# Patient Record
Sex: Female | Born: 1940 | Race: Black or African American | Hispanic: No | Smoking: Never smoker
Health system: Southern US, Community
[De-identification: ages and names within clinical notes are randomized; demographics above are authoritative.]

## PROBLEM LIST (undated history)

## (undated) DIAGNOSIS — K219 Gastro-esophageal reflux disease without esophagitis: Secondary | ICD-10-CM

## (undated) DIAGNOSIS — I219 Acute myocardial infarction, unspecified: Secondary | ICD-10-CM

## (undated) DIAGNOSIS — G5701 Lesion of sciatic nerve, right lower limb: Secondary | ICD-10-CM

## (undated) DIAGNOSIS — I639 Cerebral infarction, unspecified: Secondary | ICD-10-CM

## (undated) DIAGNOSIS — I10 Essential (primary) hypertension: Secondary | ICD-10-CM

## (undated) DIAGNOSIS — E119 Type 2 diabetes mellitus without complications: Secondary | ICD-10-CM

## (undated) HISTORY — DX: Lesion of sciatic nerve, right lower limb: G57.01

## (undated) HISTORY — PX: ABDOMINAL HYSTERECTOMY: SHX81

## (undated) HISTORY — PX: BREAST BIOPSY: SHX20

## (undated) HISTORY — PX: BREAST SURGERY: SHX581

---

## 2005-02-07 ENCOUNTER — Ambulatory Visit: Payer: Self-pay | Admitting: Nurse Practitioner

## 2005-02-25 ENCOUNTER — Other Ambulatory Visit: Payer: Self-pay

## 2005-02-25 ENCOUNTER — Emergency Department: Payer: Self-pay | Admitting: Emergency Medicine

## 2005-03-19 ENCOUNTER — Ambulatory Visit: Payer: Self-pay | Admitting: *Deleted

## 2005-12-25 ENCOUNTER — Ambulatory Visit: Payer: Self-pay | Admitting: *Deleted

## 2006-02-21 ENCOUNTER — Ambulatory Visit: Payer: Self-pay | Admitting: *Deleted

## 2007-02-24 ENCOUNTER — Ambulatory Visit: Payer: Self-pay | Admitting: *Deleted

## 2007-10-01 ENCOUNTER — Ambulatory Visit: Payer: Self-pay | Admitting: Internal Medicine

## 2007-10-02 ENCOUNTER — Ambulatory Visit: Payer: Self-pay | Admitting: Internal Medicine

## 2008-03-17 ENCOUNTER — Ambulatory Visit: Payer: Self-pay | Admitting: *Deleted

## 2008-05-13 ENCOUNTER — Ambulatory Visit: Payer: Self-pay | Admitting: Family Medicine

## 2008-08-17 ENCOUNTER — Inpatient Hospital Stay: Payer: Self-pay | Admitting: Internal Medicine

## 2008-10-10 ENCOUNTER — Ambulatory Visit: Payer: Self-pay | Admitting: Internal Medicine

## 2008-10-12 ENCOUNTER — Ambulatory Visit: Payer: Self-pay | Admitting: Internal Medicine

## 2008-12-16 ENCOUNTER — Emergency Department: Payer: Self-pay | Admitting: Emergency Medicine

## 2008-12-20 ENCOUNTER — Ambulatory Visit: Payer: Self-pay | Admitting: Orthopedic Surgery

## 2009-01-25 ENCOUNTER — Encounter: Payer: Self-pay | Admitting: Family Medicine

## 2009-02-02 ENCOUNTER — Encounter: Payer: Self-pay | Admitting: Family Medicine

## 2009-03-05 ENCOUNTER — Encounter: Payer: Self-pay | Admitting: Family Medicine

## 2009-03-20 ENCOUNTER — Ambulatory Visit: Payer: Self-pay | Admitting: Family Medicine

## 2009-06-12 ENCOUNTER — Ambulatory Visit: Payer: Self-pay | Admitting: Family Medicine

## 2009-08-01 ENCOUNTER — Emergency Department: Payer: Self-pay | Admitting: Emergency Medicine

## 2009-08-17 ENCOUNTER — Inpatient Hospital Stay: Payer: Self-pay | Admitting: Internal Medicine

## 2010-05-09 ENCOUNTER — Ambulatory Visit: Payer: Self-pay | Admitting: Internal Medicine

## 2011-05-14 ENCOUNTER — Ambulatory Visit: Payer: Self-pay | Admitting: Internal Medicine

## 2011-12-25 ENCOUNTER — Emergency Department: Payer: Self-pay

## 2011-12-25 LAB — CBC
MCH: 30 pg (ref 26.0–34.0)
MCHC: 32.8 g/dL (ref 32.0–36.0)
MCV: 92 fL (ref 80–100)
Platelet: 288 10*3/uL (ref 150–440)
RBC: 3.9 10*6/uL (ref 3.80–5.20)
RDW: 12.6 % (ref 11.5–14.5)

## 2011-12-25 LAB — COMPREHENSIVE METABOLIC PANEL
Albumin: 3.3 g/dL — ABNORMAL LOW (ref 3.4–5.0)
Anion Gap: 10 (ref 7–16)
BUN: 17 mg/dL (ref 7–18)
Chloride: 107 mmol/L (ref 98–107)
Co2: 23 mmol/L (ref 21–32)
EGFR (African American): >60
EGFR (Non-African Amer.): >60
Potassium: 4.1 mmol/L (ref 3.5–5.1)
SGOT(AST): 25 U/L (ref 15–37)
Sodium: 140 mmol/L (ref 136–145)
Total Protein: 6.9 g/dL (ref 6.4–8.2)

## 2011-12-25 LAB — URINALYSIS, COMPLETE
Bacteria: NONE SEEN
Bilirubin,UR: NEGATIVE
Blood: NEGATIVE
Glucose,UR: NEGATIVE mg/dL (ref 0–75)
Hyaline Cast: 4
Nitrite: NEGATIVE
Ph: 5 (ref 4.5–8.0)
Protein: NEGATIVE
Squamous Epithelial: 1

## 2011-12-27 ENCOUNTER — Emergency Department: Payer: Self-pay | Admitting: Emergency Medicine

## 2011-12-27 LAB — URINALYSIS, COMPLETE
Bacteria: NONE SEEN
Bilirubin,UR: NEGATIVE
Blood: NEGATIVE
Glucose,UR: >=500 mg/dL (ref 0–75)
Hyaline Cast: 3
Leukocyte Esterase: NEGATIVE
Ph: 5 (ref 4.5–8.0)
Protein: NEGATIVE
RBC,UR: 1 /HPF (ref 0–5)
Specific Gravity: 1.026 (ref 1.003–1.030)
Transitional Epi: <1
WBC UR: 1 /HPF (ref 0–5)

## 2011-12-27 LAB — CBC
HCT: 35.5 % (ref 35.0–47.0)
MCHC: 30.7 g/dL — ABNORMAL LOW (ref 32.0–36.0)
MCV: 94 fL (ref 80–100)
RBC: 3.79 10*6/uL — ABNORMAL LOW (ref 3.80–5.20)
RDW: 13.1 % (ref 11.5–14.5)

## 2011-12-27 LAB — BASIC METABOLIC PANEL
Anion Gap: 14 (ref 7–16)
BUN: 25 mg/dL — ABNORMAL HIGH (ref 7–18)
Calcium, Total: 8.8 mg/dL (ref 8.5–10.1)
Co2: 20 mmol/L — ABNORMAL LOW (ref 21–32)
EGFR (African American): >60
EGFR (Non-African Amer.): 56 — ABNORMAL LOW
Osmolality: 293 (ref 275–301)
Sodium: 134 mmol/L — ABNORMAL LOW (ref 136–145)

## 2011-12-27 LAB — CK TOTAL AND CKMB (NOT AT ARMC)
CK, Total: 156 U/L (ref 21–215)
CK-MB: 2.4 ng/mL (ref 0.5–3.6)

## 2011-12-27 LAB — TROPONIN I: Troponin-I: <0.02 ng/mL

## 2011-12-29 ENCOUNTER — Inpatient Hospital Stay: Payer: Self-pay | Admitting: Internal Medicine

## 2011-12-29 LAB — CBC
HCT: 38.6 % (ref 35.0–47.0)
MCH: 30 pg (ref 26.0–34.0)
MCV: 95 fL (ref 80–100)
Platelet: 301 10*3/uL (ref 150–440)
RDW: 13.3 % (ref 11.5–14.5)
WBC: 14.7 10*3/uL — ABNORMAL HIGH (ref 3.6–11.0)

## 2011-12-29 LAB — BASIC METABOLIC PANEL
Anion Gap: 18 — ABNORMAL HIGH (ref 7–16)
Calcium, Total: 9.6 mg/dL (ref 8.5–10.1)
Chloride: 97 mmol/L — ABNORMAL LOW (ref 98–107)
Co2: 15 mmol/L — ABNORMAL LOW (ref 21–32)
EGFR (African American): >60
EGFR (African American): >60
EGFR (Non-African Amer.): 57 — ABNORMAL LOW
Glucose: 568 mg/dL (ref 65–99)
Glucose: 171 mg/dL — ABNORMAL HIGH (ref 65–99)
Osmolality: 294 (ref 275–301)
Potassium: 4.6 mmol/L (ref 3.5–5.1)
Sodium: 131 mmol/L — ABNORMAL LOW (ref 136–145)
Sodium: 139 mmol/L (ref 136–145)

## 2011-12-29 LAB — HEPATIC FUNCTION PANEL A (ARMC)
Albumin: 3.8 g/dL (ref 3.4–5.0)
Alkaline Phosphatase: 80 U/L (ref 50–136)
Bilirubin,Total: 0.8 mg/dL (ref 0.2–1.0)
SGPT (ALT): 22 U/L

## 2011-12-29 LAB — URINALYSIS, COMPLETE
Ph: 5 (ref 4.5–8.0)
RBC,UR: 11 /HPF (ref 0–5)
Specific Gravity: 1.022 (ref 1.003–1.030)
Squamous Epithelial: 1
WBC UR: 140 /HPF (ref 0–5)

## 2011-12-29 LAB — TROPONIN I
Troponin-I: <0.02 ng/mL
Troponin-I: 0.02 ng/mL

## 2011-12-29 LAB — CK TOTAL AND CKMB (NOT AT ARMC)
CK, Total: 306 U/L — ABNORMAL HIGH (ref 21–215)
CK-MB: 3.6 ng/mL (ref 0.5–3.6)

## 2011-12-29 LAB — LIPASE, BLOOD: Lipase: 91 U/L (ref 73–393)

## 2011-12-30 LAB — CBC WITH DIFFERENTIAL/PLATELET
Basophil #: 0 10*3/uL (ref 0.0–0.1)
Basophil %: 0.4 %
Eosinophil #: 0.1 10*3/uL (ref 0.0–0.7)
Eosinophil %: 0.4 %
HCT: 32.1 % — ABNORMAL LOW (ref 35.0–47.0)
HGB: 10.6 g/dL — ABNORMAL LOW (ref 12.0–16.0)
Lymphocyte #: 3.2 10*3/uL (ref 1.0–3.6)
MCHC: 32.8 g/dL (ref 32.0–36.0)
MCV: 91 fL (ref 80–100)
Neutrophil #: 8.3 10*3/uL — ABNORMAL HIGH (ref 1.4–6.5)
Neutrophil %: 65 %
RDW: 13.1 % (ref 11.5–14.5)
WBC: 12.7 10*3/uL — ABNORMAL HIGH (ref 3.6–11.0)

## 2011-12-30 LAB — COMPREHENSIVE METABOLIC PANEL
Albumin: 3.1 g/dL — ABNORMAL LOW (ref 3.4–5.0)
Alkaline Phosphatase: 60 U/L (ref 50–136)
Anion Gap: 11 (ref 7–16)
BUN: 17 mg/dL (ref 7–18)
Bilirubin,Total: 0.5 mg/dL (ref 0.2–1.0)
Chloride: 109 mmol/L — ABNORMAL HIGH (ref 98–107)
Creatinine: 1 mg/dL (ref 0.60–1.30)
EGFR (African American): >60
Osmolality: 281 (ref 275–301)
Potassium: 3.4 mmol/L — ABNORMAL LOW (ref 3.5–5.1)
SGOT(AST): 29 U/L (ref 15–37)
Sodium: 141 mmol/L (ref 136–145)
Total Protein: 6.4 g/dL (ref 6.4–8.2)

## 2011-12-30 LAB — CK TOTAL AND CKMB (NOT AT ARMC): CK-MB: 2.8 ng/mL (ref 0.5–3.6)

## 2011-12-31 LAB — CBC WITH DIFFERENTIAL/PLATELET
Basophil #: 0 10*3/uL (ref 0.0–0.1)
Eosinophil #: 0.3 10*3/uL (ref 0.0–0.7)
HCT: 31.7 % — ABNORMAL LOW (ref 35.0–47.0)
Lymphocyte %: 47.3 %
Monocyte #: 0.5 x10 3/mm (ref 0.2–0.9)
Neutrophil #: 2.7 10*3/uL (ref 1.4–6.5)
Neutrophil %: 40.3 %
Platelet: 244 10*3/uL (ref 150–440)
RBC: 3.47 10*6/uL — ABNORMAL LOW (ref 3.80–5.20)
RDW: 13.3 % (ref 11.5–14.5)
WBC: 6.8 10*3/uL (ref 3.6–11.0)

## 2011-12-31 LAB — IRON AND TIBC
Iron Bind.Cap.(Total): 246 ug/dL — ABNORMAL LOW (ref 250–450)
Iron Saturation: 44 %
Unbound Iron-Bind.Cap.: 138 ug/dL

## 2011-12-31 LAB — BASIC METABOLIC PANEL
Anion Gap: 9 (ref 7–16)
Calcium, Total: 8.1 mg/dL — ABNORMAL LOW (ref 8.5–10.1)
Chloride: 112 mmol/L — ABNORMAL HIGH (ref 98–107)
Co2: 22 mmol/L (ref 21–32)
Creatinine: 0.76 mg/dL (ref 0.60–1.30)
Osmolality: 283 (ref 275–301)
Potassium: 3.5 mmol/L (ref 3.5–5.1)

## 2012-01-01 LAB — BASIC METABOLIC PANEL
Anion Gap: 9 (ref 7–16)
Calcium, Total: 8.4 mg/dL — ABNORMAL LOW (ref 8.5–10.1)
Chloride: 107 mmol/L (ref 98–107)
Co2: 26 mmol/L (ref 21–32)
Creatinine: 0.66 mg/dL (ref 0.60–1.30)
EGFR (Non-African Amer.): >60
Osmolality: 277 (ref 275–301)
Potassium: 3.3 mmol/L — ABNORMAL LOW (ref 3.5–5.1)

## 2012-01-01 LAB — CBC WITH DIFFERENTIAL/PLATELET
Basophil %: 0.5 %
Eosinophil #: 0.4 10*3/uL (ref 0.0–0.7)
Eosinophil %: 7.1 %
HGB: 11.5 g/dL — ABNORMAL LOW (ref 12.0–16.0)
MCH: 29.6 pg (ref 26.0–34.0)
MCHC: 32.5 g/dL (ref 32.0–36.0)
MCV: 91 fL (ref 80–100)
Monocyte %: 10.2 %
Neutrophil #: 2.6 10*3/uL (ref 1.4–6.5)
Neutrophil %: 41 %
RBC: 3.88 10*6/uL (ref 3.80–5.20)
RDW: 13.1 % (ref 11.5–14.5)
WBC: 6.2 10*3/uL (ref 3.6–11.0)

## 2012-01-02 LAB — BASIC METABOLIC PANEL
Anion Gap: 10 (ref 7–16)
Calcium, Total: 8.2 mg/dL — ABNORMAL LOW (ref 8.5–10.1)
Chloride: 109 mmol/L — ABNORMAL HIGH (ref 98–107)
Co2: 22 mmol/L (ref 21–32)
Creatinine: 0.72 mg/dL (ref 0.60–1.30)
EGFR (African American): >60
EGFR (Non-African Amer.): >60
Glucose: 245 mg/dL — ABNORMAL HIGH (ref 65–99)
Osmolality: 286 (ref 275–301)
Potassium: 3.8 mmol/L (ref 3.5–5.1)

## 2012-01-02 LAB — MAGNESIUM: Magnesium: 1.4 mg/dL — ABNORMAL LOW

## 2012-01-03 LAB — CBC WITH DIFFERENTIAL/PLATELET
Basophil #: 0 10*3/uL (ref 0.0–0.1)
Basophil %: 0.8 %
Eosinophil #: 0.3 10*3/uL (ref 0.0–0.7)
Eosinophil %: 6.1 %
HCT: 34.9 % — ABNORMAL LOW (ref 35.0–47.0)
HGB: 11.6 g/dL — ABNORMAL LOW (ref 12.0–16.0)
Lymphocyte %: 37.8 %
MCV: 92 fL (ref 80–100)
Monocyte #: 0.4 x10 3/mm (ref 0.2–0.9)
Neutrophil #: 2.6 10*3/uL (ref 1.4–6.5)
Neutrophil %: 48.7 %
Platelet: 252 10*3/uL (ref 150–440)
RBC: 3.8 10*6/uL (ref 3.80–5.20)
RDW: 13.1 % (ref 11.5–14.5)

## 2012-01-03 LAB — BASIC METABOLIC PANEL
Anion Gap: 10 (ref 7–16)
Calcium, Total: 8.9 mg/dL (ref 8.5–10.1)
Creatinine: 0.7 mg/dL (ref 0.60–1.30)
EGFR (Non-African Amer.): >60
Potassium: 4.1 mmol/L (ref 3.5–5.1)
Sodium: 139 mmol/L (ref 136–145)

## 2012-02-17 ENCOUNTER — Emergency Department: Payer: Self-pay | Admitting: Emergency Medicine

## 2012-02-17 LAB — URINALYSIS, COMPLETE
Blood: NEGATIVE
Glucose,UR: >=500 mg/dL (ref 0–75)
Hyaline Cast: 6
RBC,UR: NONE SEEN /HPF (ref 0–5)
Specific Gravity: 1.022 (ref 1.003–1.030)
WBC UR: <1 /HPF (ref 0–5)

## 2012-02-17 LAB — TROPONIN I: Troponin-I: <0.02 ng/mL

## 2012-02-17 LAB — CBC
MCH: 29.5 pg (ref 26.0–34.0)
MCHC: 31 g/dL — ABNORMAL LOW (ref 32.0–36.0)
MCV: 95 fL (ref 80–100)
Platelet: 279 10*3/uL (ref 150–440)
RDW: 13.7 % (ref 11.5–14.5)

## 2012-02-17 LAB — COMPREHENSIVE METABOLIC PANEL
Alkaline Phosphatase: 99 U/L (ref 50–136)
Anion Gap: 13 (ref 7–16)
Bilirubin,Total: 0.7 mg/dL (ref 0.2–1.0)
Calcium, Total: 9.2 mg/dL (ref 8.5–10.1)
Chloride: 99 mmol/L (ref 98–107)
Co2: 23 mmol/L (ref 21–32)
Creatinine: 1.4 mg/dL — ABNORMAL HIGH (ref 0.60–1.30)
EGFR (African American): 43 — ABNORMAL LOW
EGFR (Non-African Amer.): 37 — ABNORMAL LOW
Osmolality: 296 (ref 275–301)
Sodium: 135 mmol/L — ABNORMAL LOW (ref 136–145)

## 2012-03-29 ENCOUNTER — Emergency Department: Payer: Self-pay | Admitting: Emergency Medicine

## 2012-04-01 ENCOUNTER — Emergency Department: Payer: Self-pay | Admitting: Emergency Medicine

## 2012-04-04 ENCOUNTER — Inpatient Hospital Stay: Payer: Self-pay

## 2012-04-04 LAB — BASIC METABOLIC PANEL
Anion Gap: 29 — ABNORMAL HIGH (ref 7–16)
BUN: 53 mg/dL — ABNORMAL HIGH (ref 7–18)
BUN: 56 mg/dL — ABNORMAL HIGH (ref 7–18)
Calcium, Total: 9.2 mg/dL (ref 8.5–10.1)
Chloride: 104 mmol/L (ref 98–107)
Creatinine: 2.4 mg/dL — ABNORMAL HIGH (ref 0.60–1.30)
EGFR (Non-African Amer.): 19 — ABNORMAL LOW
Glucose: 1029 mg/dL (ref 65–99)
Glucose: 828 mg/dL (ref 65–99)
Osmolality: 332 (ref 275–301)
Osmolality: 334 (ref 275–301)
Sodium: 133 mmol/L — ABNORMAL LOW (ref 136–145)

## 2012-04-04 LAB — URINALYSIS, COMPLETE
Bilirubin,UR: NEGATIVE
Glucose,UR: >=500 mg/dL (ref 0–75)
Leukocyte Esterase: NEGATIVE
Nitrite: NEGATIVE
Ph: 5 (ref 4.5–8.0)
RBC,UR: 1 /HPF (ref 0–5)
Specific Gravity: 1.017 (ref 1.003–1.030)
Squamous Epithelial: <1
WBC UR: 2 /HPF (ref 0–5)

## 2012-04-04 LAB — CBC WITH DIFFERENTIAL/PLATELET
Bands: 10 %
Basophil %: 0.3 %
Eosinophil %: 0.6 %
Lymphocyte %: 13.7 %
Lymphocytes: 13 %
MCH: 30.5 pg (ref 26.0–34.0)
MCHC: 27.9 g/dL — ABNORMAL LOW (ref 32.0–36.0)
MCV: 109 fL — ABNORMAL HIGH (ref 80–100)
Monocyte #: 3.1 x10 3/mm — ABNORMAL HIGH (ref 0.2–0.9)
Monocyte %: 9.4 %
Monocytes: 12 %
Neutrophil %: 76 %
RDW: 15 % — ABNORMAL HIGH (ref 11.5–14.5)
Segmented Neutrophils: 65 %

## 2012-04-05 LAB — CBC WITH DIFFERENTIAL/PLATELET
Basophil %: 0.3 %
Eosinophil %: 0.2 %
HCT: 35 % (ref 35.0–47.0)
HGB: 11.4 g/dL — ABNORMAL LOW (ref 12.0–16.0)
Lymphocyte #: 2.3 10*3/uL (ref 1.0–3.6)
Lymphocyte %: 9.3 %
MCH: 30.8 pg (ref 26.0–34.0)
MCV: 94 fL (ref 80–100)
Monocyte %: 5.7 %
RBC: 3.71 10*6/uL — ABNORMAL LOW (ref 3.80–5.20)
RDW: 13.6 % (ref 11.5–14.5)
WBC: 24.3 10*3/uL — ABNORMAL HIGH (ref 3.6–11.0)

## 2012-04-05 LAB — BASIC METABOLIC PANEL
Anion Gap: 17 — ABNORMAL HIGH (ref 7–16)
BUN: 59 mg/dL — ABNORMAL HIGH (ref 7–18)
BUN: 53 mg/dL — ABNORMAL HIGH (ref 7–18)
BUN: 45 mg/dL — ABNORMAL HIGH (ref 7–18)
Calcium, Total: 9.2 mg/dL (ref 8.5–10.1)
Chloride: 115 mmol/L — ABNORMAL HIGH (ref 98–107)
Chloride: 114 mmol/L — ABNORMAL HIGH (ref 98–107)
Chloride: 118 mmol/L — ABNORMAL HIGH (ref 98–107)
Co2: 11 mmol/L — ABNORMAL LOW (ref 21–32)
Co2: 17 mmol/L — ABNORMAL LOW (ref 21–32)
Co2: 24 mmol/L (ref 21–32)
Co2: 20 mmol/L — ABNORMAL LOW (ref 21–32)
Creatinine: 2.42 mg/dL — ABNORMAL HIGH (ref 0.60–1.30)
Creatinine: 1.79 mg/dL — ABNORMAL HIGH (ref 0.60–1.30)
EGFR (African American): 22 — ABNORMAL LOW
EGFR (African American): 32 — ABNORMAL LOW
EGFR (Non-African Amer.): 19 — ABNORMAL LOW
EGFR (Non-African Amer.): 28 — ABNORMAL LOW
Glucose: 665 mg/dL (ref 65–99)
Glucose: 281 mg/dL — ABNORMAL HIGH (ref 65–99)
Glucose: 253 mg/dL — ABNORMAL HIGH (ref 65–99)
Osmolality: 323 (ref 275–301)
Osmolality: 315 (ref 275–301)
Potassium: 4.4 mmol/L (ref 3.5–5.1)
Potassium: 3.2 mmol/L — ABNORMAL LOW (ref 3.5–5.1)
Potassium: 3.2 mmol/L — ABNORMAL LOW (ref 3.5–5.1)
Potassium: 4.1 mmol/L (ref 3.5–5.1)
Sodium: 141 mmol/L (ref 136–145)
Sodium: 148 mmol/L — ABNORMAL HIGH (ref 136–145)
Sodium: 148 mmol/L — ABNORMAL HIGH (ref 136–145)

## 2012-04-05 LAB — LIPID PANEL
HDL Cholesterol: 74 mg/dL — ABNORMAL HIGH (ref 40–60)
Ldl Cholesterol, Calc: 94 mg/dL (ref 0–100)
Triglycerides: 120 mg/dL (ref 0–200)
VLDL Cholesterol, Calc: 24 mg/dL (ref 5–40)

## 2012-04-06 LAB — CBC WITH DIFFERENTIAL/PLATELET
Basophil #: 0.2 10*3/uL — ABNORMAL HIGH (ref 0.0–0.1)
Basophil %: 0.7 %
Eosinophil #: 0 10*3/uL (ref 0.0–0.7)
HCT: 33 % — ABNORMAL LOW (ref 35.0–47.0)
HGB: 10.8 g/dL — ABNORMAL LOW (ref 12.0–16.0)
Lymphocyte #: 2 10*3/uL (ref 1.0–3.6)
Lymphocyte %: 8.7 %
MCHC: 32.8 g/dL (ref 32.0–36.0)
MCV: 95 fL (ref 80–100)
Monocyte #: 1.3 x10 3/mm — ABNORMAL HIGH (ref 0.2–0.9)
Monocyte %: 5.7 %
Neutrophil #: 19.2 10*3/uL — ABNORMAL HIGH (ref 1.4–6.5)
Neutrophil %: 84.8 %
Platelet: 254 10*3/uL (ref 150–440)
RBC: 3.49 10*6/uL — ABNORMAL LOW (ref 3.80–5.20)
WBC: 22.6 10*3/uL — ABNORMAL HIGH (ref 3.6–11.0)

## 2012-04-06 LAB — BASIC METABOLIC PANEL
Anion Gap: 11 (ref 7–16)
Calcium, Total: 8.4 mg/dL — ABNORMAL LOW (ref 8.5–10.1)
Chloride: 116 mmol/L — ABNORMAL HIGH (ref 98–107)
EGFR (African American): 55 — ABNORMAL LOW
Osmolality: 315 (ref 275–301)
Potassium: 4.4 mmol/L (ref 3.5–5.1)
Sodium: 147 mmol/L — ABNORMAL HIGH (ref 136–145)

## 2012-04-07 LAB — BASIC METABOLIC PANEL
Anion Gap: 6 — ABNORMAL LOW (ref 7–16)
Calcium, Total: 7.7 mg/dL — ABNORMAL LOW (ref 8.5–10.1)
Chloride: 115 mmol/L — ABNORMAL HIGH (ref 98–107)
EGFR (African American): >60
EGFR (Non-African Amer.): >60
Glucose: 84 mg/dL (ref 65–99)
Sodium: 147 mmol/L — ABNORMAL HIGH (ref 136–145)

## 2012-04-07 LAB — CBC WITH DIFFERENTIAL/PLATELET
Basophil #: 0.1 10*3/uL (ref 0.0–0.1)
Eosinophil #: 0.1 10*3/uL (ref 0.0–0.7)
Eosinophil %: 1.5 %
HGB: 9.8 g/dL — ABNORMAL LOW (ref 12.0–16.0)
Lymphocyte #: 3.2 10*3/uL (ref 1.0–3.6)
Lymphocyte %: 35.2 %
MCH: 31.5 pg (ref 26.0–34.0)
MCHC: 33.7 g/dL (ref 32.0–36.0)
MCV: 94 fL (ref 80–100)
Monocyte #: 0.5 x10 3/mm (ref 0.2–0.9)
Neutrophil %: 56.4 %
Platelet: 192 10*3/uL (ref 150–440)
RBC: 3.12 10*6/uL — ABNORMAL LOW (ref 3.80–5.20)
RDW: 13.8 % (ref 11.5–14.5)

## 2012-04-08 LAB — BASIC METABOLIC PANEL
Anion Gap: 5 — ABNORMAL LOW (ref 7–16)
Calcium, Total: 8.3 mg/dL — ABNORMAL LOW (ref 8.5–10.1)
Chloride: 115 mmol/L — ABNORMAL HIGH (ref 98–107)
Co2: 25 mmol/L (ref 21–32)
EGFR (Non-African Amer.): >60
Glucose: 41 mg/dL — ABNORMAL LOW (ref 65–99)
Osmolality: 285 (ref 275–301)
Potassium: 4.1 mmol/L (ref 3.5–5.1)
Sodium: 145 mmol/L (ref 136–145)

## 2012-04-08 LAB — CBC WITH DIFFERENTIAL/PLATELET
Basophil %: 0.5 %
Eosinophil %: 2.6 %
HCT: 31.8 % — ABNORMAL LOW (ref 35.0–47.0)
HGB: 10.7 g/dL — ABNORMAL LOW (ref 12.0–16.0)
Lymphocyte #: 3.7 10*3/uL — ABNORMAL HIGH (ref 1.0–3.6)
MCH: 31.2 pg (ref 26.0–34.0)
MCV: 92 fL (ref 80–100)
Monocyte #: 0.4 x10 3/mm (ref 0.2–0.9)
Neutrophil #: 2.6 10*3/uL (ref 1.4–6.5)
RBC: 3.44 10*6/uL — ABNORMAL LOW (ref 3.80–5.20)

## 2012-04-09 LAB — BASIC METABOLIC PANEL
Calcium, Total: 8.8 mg/dL (ref 8.5–10.1)
Co2: 27 mmol/L (ref 21–32)
EGFR (African American): >60
EGFR (Non-African Amer.): >60
Glucose: 168 mg/dL — ABNORMAL HIGH (ref 65–99)
Osmolality: 286 (ref 275–301)
Potassium: 4 mmol/L (ref 3.5–5.1)

## 2012-04-10 LAB — CULTURE, BLOOD (SINGLE)

## 2012-09-03 ENCOUNTER — Ambulatory Visit: Payer: Self-pay

## 2013-03-29 ENCOUNTER — Ambulatory Visit: Payer: Self-pay

## 2013-06-01 ENCOUNTER — Ambulatory Visit: Payer: Self-pay | Admitting: Physical Medicine and Rehabilitation

## 2013-08-23 ENCOUNTER — Encounter: Payer: Self-pay | Admitting: Neurology

## 2013-09-05 ENCOUNTER — Encounter: Payer: Self-pay | Admitting: Neurology

## 2014-03-21 ENCOUNTER — Ambulatory Visit: Payer: Self-pay | Admitting: General Practice

## 2014-05-20 ENCOUNTER — Ambulatory Visit: Payer: Self-pay

## 2014-10-11 DIAGNOSIS — E1065 Type 1 diabetes mellitus with hyperglycemia: Secondary | ICD-10-CM | POA: Diagnosis not present

## 2014-10-11 DIAGNOSIS — E10319 Type 1 diabetes mellitus with unspecified diabetic retinopathy without macular edema: Secondary | ICD-10-CM | POA: Diagnosis not present

## 2014-10-16 DIAGNOSIS — Z794 Long term (current) use of insulin: Secondary | ICD-10-CM | POA: Diagnosis not present

## 2014-10-16 DIAGNOSIS — E119 Type 2 diabetes mellitus without complications: Secondary | ICD-10-CM | POA: Diagnosis not present

## 2014-10-16 DIAGNOSIS — F329 Major depressive disorder, single episode, unspecified: Secondary | ICD-10-CM | POA: Diagnosis not present

## 2014-10-16 DIAGNOSIS — H409 Unspecified glaucoma: Secondary | ICD-10-CM | POA: Diagnosis not present

## 2014-10-16 DIAGNOSIS — I10 Essential (primary) hypertension: Secondary | ICD-10-CM | POA: Diagnosis not present

## 2014-10-16 DIAGNOSIS — Z6829 Body mass index (BMI) 29.0-29.9, adult: Secondary | ICD-10-CM | POA: Diagnosis not present

## 2014-10-16 DIAGNOSIS — E785 Hyperlipidemia, unspecified: Secondary | ICD-10-CM | POA: Diagnosis not present

## 2014-10-16 DIAGNOSIS — K219 Gastro-esophageal reflux disease without esophagitis: Secondary | ICD-10-CM | POA: Diagnosis not present

## 2014-10-16 DIAGNOSIS — E669 Obesity, unspecified: Secondary | ICD-10-CM | POA: Diagnosis not present

## 2014-11-09 DIAGNOSIS — E10319 Type 1 diabetes mellitus with unspecified diabetic retinopathy without macular edema: Secondary | ICD-10-CM | POA: Diagnosis not present

## 2014-11-09 DIAGNOSIS — E1065 Type 1 diabetes mellitus with hyperglycemia: Secondary | ICD-10-CM | POA: Diagnosis not present

## 2014-11-22 NOTE — H&P (Signed)
PATIENT NAME:  Huyett, Angelyn PuntROBERTA L MR#:  [id number] DATE OF BIRTH:  1940/08/16  DATE OF ADMISSION:  04/04/2012  PRIMARY CARE PHYSICIAN:  Dr. Conchita Parison Chaplin. REFERRING PHYSICIAN: Dr. Daphine DeutscherMartin.   CHIEF COMPLAINT:  Lethargic today.   HISTORY OF PRESENT ILLNESS: 74 year old African American female with a history of diabetes type 1, diabetic ketoacidosis, hypertension, anemia, and coronary artery disease who presented to the ED with lethargy today. The patient is confused and unable to answer questions or provide any information according to Dr. Daphine DeutscherMartin. Per EMS the patient's family called stating the patient had been found lying on the floor at home, being lethargic all day. Upon arrival, the patient is lethargic, unable to provide any information. Her blood sugar was above 600 on glucometer. Dr. Daphine DeutscherMartin gave insulin 20 units subcutaneous, then started insulin drip. The patient was noted to have an insulin pump. According to previous document, the patient was admitted for DKA in May and discharged with Lantus.   PAST MEDICAL HISTORY: (According to previous document) 1. Diabetic ketoacidosis. 2. Gastritis syndrome. 3. H pylori.  4. Labile hypertension. 5. Hypomagnesemia. 6. Chronic anemia. 7. Diabetic retinopathy.  8. Nonobstructive coronary artery disease. 9. Glaucoma. 10. Hyperlipidemia. 11. Previous stroke.   SOCIAL HISTORY: Negative for alcohol or tobacco abuse.   FAMILY HISTORY: Positive for coronary artery disease, diabetes, hypertension, and stroke.   REVIEW OF SYSTEMS: Unable to obtain at this time.   ALLERGIES: Pollen.   MEDICATIONS: 1. Aspirin 81 mg p.o. daily.  2. Citalopram 20 mg p.o. daily.  3. Diltiazem 2 mg p.o. t.i.d.  4. HCTZ 25 mg p.o. daily. 5. Insulin aspart 5 units subcutaneous 3 times daily before meals. 6. Insulin glargine 50 units subcutaneous at bedtime.  7. K-Dur extended-release 20 milliequivalents, 2 tablets once daily.  8. Labetalol 100 mg p.o. b.i.d.   9. Lovastatin 20 mg p.o. at bedtime.  10. Magnesium oxide 400 p.o. tablets 1 tablet once daily.  11. Mobic 15 mg tablets p.o. with food daily. 12. Multiple vitamin 1 cap p.o. daily.  13. Norco 5 mg/325 mg p.o. q.6h. p.r.n.  14. NovoLog sliding scale.  15. Omeprazole 20 mg p.o. daily. 16. Quinapril 40 mg p.o. daily. 17. Zofran 800 mg p.o. b.i.d.   PHYSICAL EXAMINATION:  VITAL SIGNS: Temperature 97.9, blood pressure 104/53, pulse 100, respirations 20, oxygen saturation 96% on room air.   GENERAL: The patient is lethargic, confused and unable to communicate.   HEENT: Pupils round, equal, reactive to light. No discharge from ear or nose. Moist oral mucosa.   NECK: Supple. No JVD or carotid bruit. No lymphadenopathy. No thyromegaly.   CARDIOVASCULAR: S1, S2, regular rate and rhythm. No murmurs or gallops.   PULMONARY: Bilateral air entry. No wheezing or rales.   ABDOMEN: Soft. No distention. No organomegaly.   EXTREMITIES: No edema, clubbing, or cyanosis. Strong bilateral pedal pulses.   SKIN: No rash or jaundice but has very dry skin.   NEUROLOGY: The patient is lethargic, confused and unable to examine at this time.   LABORATORY, RADIOLOGICAL AND DIAGNOSTIC DATA: Last Accu-Chek shows more than 600. WBC 32.8, hemoglobin 11.7, platelets 328. BMP showed glucose 1,029, BUN 53, creatinine 2.4, sodium 133, potassium 3.6, chloride 96, bicarbonate less than 5, calcium 9.9, GFR 22, anion gap, unable to calculate. EKG, chest x-ray, urinalysis pending.   IMPRESSION:  1. Severe diabetic ketoacidosis.  2. Uncontrolled diabetes type 1.  3. Hyperkalemia.  4. Acute renal failure.  5. Hypotension.  6. Possible sepsis.  7.  Leukocytosis.  8. Altered mental status possibly due to above-mentioned condition.  9. Anemia.  10. Coronary artery disease.  11. History of hypertension.  PLAN OF TREATMENT:  1. The patient will be admitted to the Critical Care Unit. Continue insulin drip. Increase  normal saline to 250 mL/h. We will get an endocrinology consult from Dr. Tedd Sias.  2. We will hold ACE inhibitor, labetalol, HCTZ, potassium and Mobic due to hypotension and acute renal failure. We will give Kayexalate and follow-up BMP and EKG.  3. For possible sepsis, we will follow up culture, CBC, CRP, urinalysis and chest x-ray. We will start Zosyn and Zyvox after blood culture sample withdrawal. I called the patient's next of kin, her sister. According to her sister, the patient has started insulin pump about one month ago by endocrinologist in [place]. Discussed the patient's critical situation and plan of treatment with the patient's sister and Dr. Daphine Deutscher.       TIME SPENT: About 80 minutes.  ____________________________ Shaune Pollack, MD qc:ap D: 04/04/2012 20:51:40 ET T: 04/05/2012 10:22:21 ET JOB#: [id number]  cc: Shaune Pollack, MD, <Dictator> Jimmie Molly. Candelaria Stagers, MD Shaune Pollack MD ELECTRONICALLY SIGNED 04/05/2012 18:20

## 2014-11-22 NOTE — Discharge Summary (Signed)
PATIENT NAME:  Vicki [name], Vicki [name] MR#:  [id number] DATE OF BIRTH:  1940/10/31  DATE OF ADMISSION:  04/04/2012 DATE OF DISCHARGE:  04/11/2012  ADDENDUM:  HOSPITAL COURSE: Please see Discharge Summary dated 04/10/2012.  The patient remained stable in the hospital overnight awaiting bed placement.  There were no new labs.  She remained with hip pain and was reviewed by Orthopedics, who recommended continuing physical therapy and pain control and repeating x-ray 10 to 14 days from the initial one if pain continues. The plan will be to continue her on Norco for pain control and review her in clinic in one to two weeks for follow up. She will continue on her Lantus 28 units daily at bedtime and NovoLog 5 units three times a day.   ____________________________ Stann Mainlandavid P. Sampson GoonFitzgerald, MD dpf:cbb D: 04/11/2012 09:29:11 ET T: 04/11/2012 09:35:09 ET JOB#: [id number]  cc: Stann Mainlandavid P. Sampson GoonFitzgerald, MD, <Dictator> Arnez Stoneking Sampson GoonFITZGERALD MD ELECTRONICALLY SIGNED 04/13/2012 22:11

## 2014-11-22 NOTE — Consult Note (Signed)
74 year old female with right hip pain, on verge of discharge. reviewed and xrays examined. 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 Her pain may be related to exacerbation of this mild arthritic change following her fall. I do not see any evidence of acute injury. I would proceed with physical therapy for ambulation weight bearing as tolerated using a walker. Repeat xray would be indicated in about 10 days if she would continue to have pain. I would be happy to see her in my office for follow up as necessary.  Electronic Signatures: Clare GandySmith, Cyanna Neace E (MD)  (Signed on 06-Sep-13 14:46)  Authored  Last Updated: 06-Sep-13 14:46 by Clare GandySmith, Rosealynn Mateus E (MD)

## 2014-11-22 NOTE — Discharge Summary (Signed)
PATIENT NAME:  Vicki [name], Vicki [name] MR#:  [id number] DATE OF BIRTH:  [birth year]  DATE OF ADMISSION:  04/04/2012 DATE OF DISCHARGE:  04/10/2012  PRIMARY CARE PHYSICIAN: Dr. Clydie Braunavid Horald Birky CONSULTING ENDOCRINOLOGIST: Dr. Wendall MolaMelissa Solum   DISCHARGE DIAGNOSES: 1. Insulin-dependent diabetes mellitus with diabetic ketoacidosis. 2. Acute renal failure. 3. Leukocytosis. 4. Hypertension. 5. Right hip pain status post fall.   HISTORY OF PRESENT ILLNESS: Patient is a 74 year old with known poorly controlled diabetes and hypertension who was admitted after being found by her family to be confused and lying on the floor. Her sugar was known to be markedly elevated and she was found to be in diabetic ketoacidosis. For complete details see admission history and physical done 04/04/2012.   HOSPITAL COURSE BY ISSUE: 1. Diabetic ketoacidosis, insulin-dependent diabetes mellitus. Patient was admitted to the Intensive Care Unit. She was treated with IV fluids and an insulin drip. She was seen by Dr. Tedd SiasSolum who helped manager her diabetes. After two days she was transferred out of the Intensive Care Unit and transitioned to sub-Q insulin. Her sugars have been variable as her oral intake has increased. Dr. Tedd SiasSolum has been adjusting her Lantus and NovoLog insulin based on her sugars. The plan will be to have her continue on Lantus at a lower dose of 34 units and mealtime NovoLog. She will follow up with Dr. Tedd SiasSolum for this. 2. Acute renal failure. On admission her creatinine was marked elevated at 2.4 but it is decreased to 0.7 with IV fluids. 3. Hypertension. Initially her medications were held given her diabetic ketoacidosis, however, she was started back on her quinapril, labetalol and hydrochlorothiazide.  4. Hyperkalemia initially due to acute renal failure. This has resolved and she is back on her replacement potassium dosing. 5. Marked leukocytosis. On admission her white count was over 20. She was given two days of  Zosyn and Zyvox. Blood cultures and urine cultures were negative. Antibiotics were discontinued after two days and she has remained stable and has no evidence of an infection at this time. Leukocytosis was felt to be due to stress from the diabetic ketoacidosis.  6. Right hip pain. Patient does report a fall but the details of this are relatively unclear. She did have an x-ray of her right hip and her knee which were negative for any obvious etiology of her pain. Orthopedics has been consulted but as of this moment have not seen her for further recommendations. Her pain has been controlled with Norco and with anti-inflammatories.  7. Immobility. Patient has been working with physical therapy who feels she could likely benefit from short-term skilled nursing facility or short-term rehab. She will be transitioned to a rehab facility once orthopedics has evaluated her pain.   DISCHARGE DIET: ADA, carb-controlled diet.  DISCHARGE ACTIVITY: As tolerated with physical therapy at rehab.  DISCHARGE FOLLOW UP: Patient is to follow up with Dr. Sampson GoonFitzgerald and Dr. Tedd SiasSolum within 1 to 2 weeks of discharge.   DISCHARGE MEDICATIONS: 1. Lantus 28 units daily. 2. Mealtime NovoLog 5 units t.i.d.  3. Labetalol 100 mg b.i.d.  4. Quinapril 40 mg once a day half a tablet. 5. Aspirin 81 mg once a daily. 6. Citalopram 20 mg once a day. 7. Lovastatin 20 mg once a day. 8. Omeprazole 20 mg once a day. 9. Norco 5/325, 1 tablet q.6 hours as needed for pain. 10. Magnesium oxide 400 mg once a day. 11. Potassium chloride/K-Dur 20 mEq once a day. 12. Centrum multivitamin once a day.  DISCHARGE  CONDITION: Fair.   ____________________________ Stann Mainland. Sampson Goon, MD dpf:cms D: 04/10/2012 12:36:57 ET T: 04/10/2012 12:50:50 ET  JOB#: [id number] cc: Stann Mainland. Sampson Goon, MD, <Dictator>  Matilde Markie Sampson Goon MD ELECTRONICALLY SIGNED 04/13/2012 22:11

## 2014-11-27 NOTE — Consult Note (Signed)
Colonoscopy was normal, will start diet.  Electronic Signatures: Scot JunElliott, Robert T (MD)  (Signed on 29-May-13 16:06)  Authored  Last Updated: 29-May-13 16:06 by Scot JunElliott, Robert T (MD)

## 2014-11-27 NOTE — Consult Note (Signed)
I planned to use Prevpak medications for her H. pylori infection however there is a major interaction with citalopram and therefore will not be able to treat her with this regimen.  Would keep her on PPI indefinitely.  Empiric antibiotics started on admission have been discontinued.  Electronic Signatures: Scot JunElliott, Annaliyah Willig T (MD)  (Signed on 30-May-13 13:24)  Authored  Last Updated: 30-May-13 13:24 by Scot JunElliott, Kaydan Wong T (MD)

## 2014-11-27 NOTE — Consult Note (Signed)
Pt with diffuse abd tenderness on exam..  Duodenitis seen on EGD, gastritis reported on CT.  No prior colonoscopy in our records.  will talk to patient to see if wants colon done while here or later as out patient.  Electronic Signatures: Scot JunElliott, Jay Kempe T (MD)  (Signed on 28-May-13 14:31)  Authored  Last Updated: 28-May-13 14:31 by Scot JunElliott, Anthonette Lesage T (MD)

## 2014-11-27 NOTE — Consult Note (Signed)
Consult for abd pain and abn CT of stomach, plan EGD tomorrow.  Electronic Signatures: Scot JunElliott, Elmer Boutelle T (MD)  (Signed on 27-May-13 19:53)  Authored  Last Updated: 27-May-13 19:53 by Scot JunElliott, Leasia Swann T (MD)

## 2014-11-27 NOTE — Discharge Summary (Signed)
PATIENT NAME:  Vicki [name], Vicki [name] MR#:  [id number] DATE OF BIRTH:  30-Jan-1941  DATE OF ADMISSION:  12/29/2011 DATE OF DISCHARGE:  01/03/2012  HISTORY OF PRESENT ILLNESS/HOSPITAL COURSE: Vicki [name] is a 74 year old African American lady type I diabetic with nonobstructive coronary artery disease presented with abdominal pain with some radiation into her chest. She was found to be in acute diabetic ketoacidosis with sugars greater than 500 and acidotic. She informed at the time of admission she had been out of her Lantus insulin for several days. She had been in the Emergency Room several visits from some abdominal pain prior to this admission.   Patient went to Intensive Care Unit for treatment of her diabetic ketoacidosis with IV insulin drip and fluids. She responded fairly quickly. She was seen early on in consultation with gastroenterologist, Dr. Vira Agar.   She was seen by her diabetic specialist, Dr. Conley Canal, while she was hospitalized and insulin adjustments were made. She had an issue with fluctuating labile blood pressure during her hospitalization as well. Long-standing history of hypertension, had been on medication prior to admission.   Upper endoscopy showed some evidence of duodenitis. She had an H. pylori positive test. Colonoscopy was unremarkable. She became relatively asymptomatic both GI and diabetic. Blood pressure medicines were adjusted. She had some fluctuations with systolic, diastolics remained therapeutic. Major issues were one with orthostatic drops with standing. Blood pressures 528 to 413 systolic with diastolics 71 to 72 resting falling to 151/83 standing and 136/77 walking. Oximetry remained at 98% on room air. She was afebrile. She was eating well. Bowels are working well.   Information by Dr. Vira Agar he felt he could not treat her H. pylori at this time since she was on an antidepressant due to major interaction, felt she should be on her PPI twice a day indefinite. Dr. Conley Canal  recommended at time of discharge and she will see her back in the office in a few days. 15 units of Lantus at night, NovoLog 5 units before meals and continue her sliding scale at this time.   LABORATORY, DIAGNOSTIC AND RADIOLOGICAL DATA: At the time of admission her sugar was 456 to 484, BUN 25, creatinine 1, sodium 134, potassium 3.8. EGFR was greater than 60. CPKs and isoenzymes were negative. Hemoglobin 10.9. Urinalysis was unremarkable.   She did have H. pylori test that was positive, 1.2 with normal range from 0 to 0.8. Her urine culture was negative. Her stools for Clostridium difficile was negative. She had a B12 level before anemia workup at 1973, within normal limits. Her iron level was 108. Her iron binding capacity was 246, unbound capacity was 138, iron saturation was 44.   At the time of discharge her sugar was 314, BUN 14, creatinine 0.7, sodium 139, potassium 4.1, chloride 108, magnesium 1.6. She had been put on magnesium supplement. Hemoglobin 11.6. White count 5300. Differentials were unremarkable.   MEDICATIONS AT DISCHARGE: 1. We will hold her metformin at this time.  2. Omeprazole 20 mg is increased to b.i.d.  3. Quinapril 40 mg is increased to 80 mg daily. 4. Hydrochlorothiazide 25 mg was added to her program.  5. Potassium 20 mEq was continued.  6. Mag oxide 400 mg one a day was added to her program. 7. Insulin sliding scale to supplement her 5 units before meals is 1 extra unit for sugar 1501 to 200, 3 units extra for 201 to 250, 5 units extra for 251 to 300, 7 units extra with 301 to  350, and 10 units extra for 351 to 400, before meals and at bedtime.   OTHER MEDICATIONS:  1. Quinapril 80 mg daily.  2. Omeprazole 20 mg twice a day. 3. Hydrochlorothiazide 25 mg daily.  4. Novolin 5 units before meals.  5. Mag oxide 400 mg a day. 6. Lantus 15 units before bedtime with a sliding scale as mentioned above.  7. Metformin is to be held.  8. Fish oil is to be held.    FOLLOW UP: She has an appointment for follow up with DrMarland Kitchen Conley Canal in a few days and a follow up with Dr. Mable Fill in approximately a week or 10 days.    DISCHARGE DIAGNOSES:  1. Diabetic ketoacidosis. 2. Gastritis syndrome.  3. Helicobacter pylori.  4. Labile hypertension.  5. Hypomagnesemia, which should respond to the medication. 6. Chronic anemia.   ____________________________ Dianah Field. Mable Fill, MD dcc:cms D: 01/03/2012 09:14:59 ET T: 01/03/2012 11:49:25 ET JOB#: [id number]  cc: Sha Burling C. Mable Fill, MD, <Dictator> Tawni Millers MD ELECTRONICALLY SIGNED 01/09/2012 15:34

## 2014-11-27 NOTE — H&P (Signed)
PATIENT NAME:  Vicki [name], Vicki PuntROBERTA L MR#:  [id number] DATE OF BIRTH:  December 01, 1940  DATE OF ADMISSION:  12/29/2011  REFERRING PHYSICIAN: Dr. Clemens Catholicagsdale FAMILY PHYSICIAN: Dr. Candelaria Stagershaplin  REASON FOR ADMISSION: Abdominal and chest pain associated with diabetic ketoacidosis.   HISTORY OF PRESENT ILLNESS: The patient is a 74 year old female with a history of type 1 diabetes on insulin as well as nonobstructive coronary artery disease who presents to the Emergency Room with generalized abdominal pain radiating up to the chest associated with nausea. In the Emergency Room, the patient was found to have a blood glucose greater than 500 with an anion gap consistent with diabetic ketoacidosis. She is now admitted for further evaluation. The patient admits that she ran out of Lantus insulin approximately 2 to 3 days ago.   PAST MEDICAL HISTORY:  1. Type 1 diabetes.  2. Diabetic retinopathy.  3. Nonobstructive coronary artery disease.  4. Glaucoma.  5. Hyperlipidemia.  6. Previous stroke.  7. Status post cholecystectomy.  8. Status post hysterectomy.   MEDICATIONS:  1. Klor-Con 20 mEq p.o. b.i.d.  2. Quinapril 60 mg p.o. daily.  3. Aspirin 81 mg p.o. daily. 4. Labetalol 100 mg p.o. b.i.d.  5. Citalopram 20 mg p.o. daily.  6. Zantac 150 mg p.o. b.i.d.  7. Hydrochlorothiazide 12.5 mg p.o. daily.  8. Lovastatin 20 mg p.o. at bedtime.  9. Lantus 30 units sub-Q daily.  10. Humalog 10 to 20 units sub-Q q.a.c. per sliding scale.  11. Metformin 1000 mg p.o. b.i.d.   ALLERGIES: No known drug allergies.   SOCIAL HISTORY: Negative for alcohol or tobacco abuse.   FAMILY HISTORY: Positive for coronary artery disease and diabetes. Also hypertension and stroke.    REVIEW OF SYSTEMS: CONSTITUTIONAL: No fever or change in weight. EYES: No blurred or double vision. Does have glaucoma. ENT: No tinnitus or hearing loss. No nasal discharge or bleeding. No difficulty swallowing. RESPIRATORY: No cough or wheezing. No painful  respiration. CARDIOVASCULAR: Chest pain as per history of present illness. No orthopnea or palpitations. No syncope. GASTROINTESTINAL: She has had nausea but no vomiting or diarrhea. No change in bowel habits. GENITOURINARY: No dysuria or hematuria. No incontinence. ENDOCRINE: Patient has had polydipsia but no polyuria. No heat or cold intolerance. HEMATOLOGIC: Patient denies anemia, easy bruising, or bleeding. LYMPHATIC: No swollen glands. MUSCULOSKELETAL: Patient denies pain in her neck, back, shoulders, knees, or hips. No gout. NEUROLOGIC: No numbness or migraines. Denies seizures. PSYCH: Patient denies anxiety, insomnia, or depression.   PHYSICAL EXAMINATION:  GENERAL: The patient is in no acute distress.   VITAL SIGNS: Currently remarkable for a blood pressure of 154/74 with a heart rate of 76 and a respiratory rate of 18. She is afebrile.   HEENT: Normocephalic, atraumatic. Pupils equally round and reactive to light and accommodation. Extraocular movements are intact. Sclerae are not icteric. Conjunctivae are clear. Oropharynx is clear.   NECK: Supple without jugular venous distention. No adenopathy or thyromegaly is noted.   LUNGS: Essentially clear to auscultation and percussion without wheezes, rales, or rhonchi. No dullness.   CARDIAC: Regular rate and rhythm. Normal S1, S2. No significant rubs, murmurs, or gallops. PMI is nondisplaced. Chest wall is nontender.   ABDOMEN: Soft with some epigastric tenderness. No rebound or guarding. Normoactive bowel sounds. No organomegaly or mass were appreciated. No hernias or bruits were noted.   EXTREMITIES: Without clubbing, cyanosis, edema. Pulses were 2+ bilaterally.   SKIN: Warm and dry without rash or lesions.   NEUROLOGIC: Cranial nerves  II through XII grossly intact. Deep tendon reflexes were symmetric. Motor and sensory examination is nonfocal.   PSYCH: Exam revealed a patient who was alert and oriented to person, place, and time. She  was cooperative and used good judgment.   LABORATORY, DIAGNOSTIC AND RADIOLOGICAL DATA: Urinalysis showed 3+ leukocyte esterase with 140 WBCs per high-power field with trace bacteria. pH 7.16. Glucose was 568 with a BUN of 26 and a creatinine of 0.99 with a sodium of 131 and a potassium of 4.6. GFR was greater than 60. White count was 14.6 with a hemoglobin of 12.2. Troponin was less than 0.02. Lipase was 91. EKG revealed sinus rhythm with no acute ischemic changes.   ASSESSMENT:  1. Diabetic ketoacidosis.  2. Urinary tract infection.  3. Abdominal pain.  4. Atypical chest pain.  5. Hyponatremia.  6. Dehydration.  7. Previous stroke.  8. History of nonobstructive coronary artery disease.   PLAN: Patient will be admitted to the Intensive Care Unit on an insulin drip with IV normal saline. Will follow her sugars hourly and adjust the drip as needed. Will follow serial cardiac enzymes and obtain a cardiology consultation with Dr. Juliann Pares because of her atypical chest pain. Because of her abdominal pain, will send off a urine culture and begin empiric IV antibiotics. Will obtain an abdominal CT. Follow up routine labs in the morning. Monitor her blood pressure closely. Further treatment and evaluation will depend upon the patient's progress.      TOTAL TIME SPENT ON THIS PATIENT: 50 minutes.    ____________________________ Duane Lope Judithann Sheen, MD jds:cms D: 12/29/2011 16:36:24 ET T: 12/30/2011 05:33:05 ET JOB#: [id number]  cc: Duane Lope. Judithann Sheen, MD, <Dictator> Jimmie Molly. Candelaria Stagers, MD Kashaun Bebo Rodena Medin MD ELECTRONICALLY SIGNED 12/30/2011 21:08

## 2014-11-27 NOTE — Consult Note (Signed)
PATIENT NAME:  Vicki [name], Vicki [name] MR#:  [id number] DATE OF BIRTH:  02/23/1941  DATE OF CONSULTATION:  12/31/2011  REFERRING PHYSICIAN:  Andrey Farmer, MD CONSULTING PHYSICIAN:  A. Lavone Orn, MD  CHIEF COMPLAINT: Uncontrolled diabetes.   HISTORY OF PRESENT ILLNESS: This is a 74 year old female with type 1 diabetes, chronically uncontrolled, who was admitted on 12/29/2011 with diabetic ketoacidosis. The patient is well-known to me from clinic; however, she has not been seen since October 2012. She has long been noncompliant and has had an A1c in the 10% or greater range. She ran out of her Lantus eight days prior to admission. She recalls her blood sugars were running high. She developed nausea and vomiting for at least one week prior to admission. On presentation, her blood sugar was over 500 and she had an elevated anion gap and urine ketones consistent with diabetic ketoacidosis She has been reporting blood in her stool and is planned to undergo an EGD tomorrow. She is on a clear liquid diet. She has had hypoglycemia in recent days and has had her dose of Lantus decreased. Last evening she received Lantus 26 units and then had a low this morning of 55. Currently she has been ordered to receive Lantus 20 units at bedtime in addition to a sliding scale.   PAST MEDICAL HISTORY:  1. Type 1 diabetes.  2. Diabetic retinopathy.  3. Nonobstructive coronary artery disease.  4. Glaucoma.  5. Hyperlipidemia.  6. History of cerebrovascular accident.  7. History of cholecystectomy.  8. History of hysterectomy.  CURRENT INPATIENT MEDICATIONS:  1. Lantus 20 units at bedtime.  2. Celexa 20 mg daily.  3. HCTZ 12.5 mg daily.  4. Labetalol 100 mg twice a day. 5. Mevacor 20 mg daily.  6. Flagyl 500 mg every eight hours.  7. Protonix 40 mg twice a day.  8. Zosyn 3.375 every eight hours.  9. Quinapril 60 mg daily.  10. Aspirin 81 mg daily.  11. NovoLog insulin sliding scale.  12. D5 at 20 mL/h. 13. NS 0.9%  plus 48 mEq KCL at 30 mL/h.   ALLERGIES: No known drug allergies.   SOCIAL HISTORY: She does not smoke or drink alcohol. She is employed at a nursing home.   FAMILY HISTORY: Positive for diabetes, hypertension, stroke, and coronary artery disease.   REVIEW OF SYSTEMS: HEENT: No blurred vision. No sore throat. NECK: No neck pain. No dysphasia. CARDIAC: No chest pain. No palpitations. PULMONARY: No cough. No shortness of breath. ABDOMEN: Some periumbilical abdominal discomfort. No nausea. She has had diarrhea. She reports good appetite. EXTREMITIES: Denies leg swelling. SKIN: Denies rash. NEUROLOGIC: No recent falls or headaches. HEME: No easy bruisability or recent bleeding.   PHYSICAL EXAMINATION:   VITAL SIGNS: Temperature 97.7, pulse 64, respirations 18, blood pressure 210/90, pulse oximetry 95% on room air.   GENERAL: This is a well-developed [place] American female in no distress.   HEENT: Extraocular movements are intact. Oropharynx is clear.   NECK: Supple. No thyromegaly.   CARDIAC: Regular rate and rhythm. No carotid bruit.   LUNGS: Clear to auscultation bilaterally. No wheeze.   ABDOMEN: Periumbilical tenderness to palpation. No guarding. No rebound. Positive bowel sounds.   EXTREMITIES: No edema is present.   SKIN: No rash or dermatopathy.   NEUROLOGIC: No tremor.   PSYCHIATRIC: Alert and oriented x3, calm and cooperative.   LABS/STUDIES: Glucose 94, BUN 9, creatinine 0.76, sodium 143, potassium 3.5, chloride 112, eGFR greater than 60, calcium 8.1. Hematocrit 31.7%.  ASSESSMENT: This is a 74 year old female with chronically uncontrolled type 1 diabetes admitted with DKA, which has now resolved.   PLAN: Uncontrolled diabetes with recent hypoglycemia - agree with decreasing her basal Lantus to just 20 units tonight. Once she is able to eat, she should be started on prandial NovoLog of 4 units. This will be held until she is being given a regular diet. Continue current  insulin sliding scale.   Thank you for the kind request for consultation. I will follow along with you and adjust her insulin further if needed.  ____________________________ A. Lavone Orn, MD ams:slb D: 12/31/2011 17:37:22 ET T: 01/01/2012 10:32:10 ET JOB#: [id number]  cc: A. Lavone Orn, MD, <Dictator> Sherlon Handing MD ELECTRONICALLY SIGNED 01/08/2012 10:36

## 2014-11-27 NOTE — Consult Note (Signed)
Due to fact she has not had a colonoscopy in many years and she has abd pain and tenderness not explained by EGD I recommend to do colonoscopy tomorrow and she and relative agree.  Will prep for late afternoon colonoscopy.  Electronic Signatures: Scot JunElliott, Afnan Cadiente T (MD)  (Signed on 28-May-13 15:49)  Authored  Last Updated: 28-May-13 15:49 by Scot JunElliott, Pennye Beeghly T (MD)

## 2014-11-27 NOTE — Consult Note (Signed)
PATIENT NAME:  Vicki [name], Vicki [name] MR#:  [id number] DATE OF BIRTH:  21-Dec-1940  DATE OF CONSULTATION:  12/30/2011  REFERRING PHYSICIAN:   CONSULTING PHYSICIAN:  Scot Junobert T. Brynna Dobos, MD  HISTORY OF PRESENT ILLNESS: Patient is a 74 year old black female who was admitted with diabetic ketoacidosis. She complained of generalized abdominal pain radiating up to her chest with nausea. She had vomiting off and on for a month. I was asked to see her because of abdominal pain, vomiting and a CAT scan showing abnormal thickening of the stomach.   Patient says the abdominal pain has been present for 5 to 7 days and she was vomiting before she came to the hospital, probably from her diabetic ketoacidosis. She also has heartburn and indigestion. She also complains of diarrhea after eating. No rectal bleeding. Her stools are loose like muddy water.   PAST MEDICAL HISTORY:  1. Type 1 diabetes for 12 years.  2. Diabetic retinopathy.  3. Nonobstructive coronary artery disease.  4. Glaucoma.  5. Hyperlipidemia.  6. Previous stroke. 7. Previous cholecystectomy.  8. Previous hysterectomy.   MEDICATIONS ON ADMISSION:  1. Potassium 20 mEq b.i.d.  2. Quinapril 60 mg daily. 3. Aspirin 81 mg a day.  4. Labetalol 100 mg b.i.d.  5. Citalopram 20 mg daily. 6. Zantac 150 mg b.i.d.  7. HCTZ 12.5 mg daily. 8. Lovastatin 20 mg at bedtime. 9. Lantus 30 units sub-Q daily.  10. Humalog 10 to 20 units sub-Q with meals on sliding scale. 11. Metformin 1000 mg b.i.d.   ALLERGIES: No known drug allergies.   FAMILY HISTORY: Sister with breast cancer, an aunt with breast cancer. Patient says she gets yearly mammograms.   REVIEW OF SYSTEMS: No asthma, wheezing, cough or shortness of breath. She does have epigastric abdominal pain, tenderness, chest pain, nausea and vomiting and diarrhea. She has had polydipsia.   PHYSICAL EXAMINATION:  GENERAL: Black female no acute distress.   HEENT: Sclerae nonicteric. Conjunctivae  negative. Tongue negative.   NECK: 1/6 bruit on the left.   CHEST: Clear.   HEART: No murmurs or gallops I can hear.   ABDOMEN: Showed mild epigastric tenderness to palpation. No hepatosplenomegaly. No masses. No bruits.   PSYCHIATRIC: Mood and affect are appropriate   LABORATORY, DIAGNOSTIC AND RADIOLOGICAL DATA: Lab data on admission: Urinalysis with urinary tract infection. Arterial blood gas pH 7.16. Glucose 568, BUN 26, creatinine 0.99, sodium 130, potassium 4.6, white count 4.6, hemoglobin 12, lipase 91. CAT scan shows thickening of the stomach.   ASSESSMENT: Gastritis, probable gastric ulcers, cannot rule out neoplasm.   PLAN: Upper endoscopy for tomorrow. Patient has not had a colonoscopy in many years, may schedule this as an outpatient. ____________________________ Scot Junobert T. Reyah Streeter, MD rte:cms D: 12/30/2011 19:52:39 ET T: 12/31/2011 05:36:35 ET  JOB#: [id number] Wilber BihariOBERT T Yaminah Clayborn MD ELECTRONICALLY SIGNED 01/10/2012 17:51

## 2014-11-30 DIAGNOSIS — H4011X3 Primary open-angle glaucoma, severe stage: Secondary | ICD-10-CM | POA: Diagnosis not present

## 2015-02-24 DIAGNOSIS — E10319 Type 1 diabetes mellitus with unspecified diabetic retinopathy without macular edema: Secondary | ICD-10-CM | POA: Diagnosis not present

## 2015-02-24 DIAGNOSIS — E1065 Type 1 diabetes mellitus with hyperglycemia: Secondary | ICD-10-CM | POA: Diagnosis not present

## 2015-03-31 DIAGNOSIS — E1065 Type 1 diabetes mellitus with hyperglycemia: Secondary | ICD-10-CM | POA: Diagnosis not present

## 2015-03-31 DIAGNOSIS — H3500 Unspecified background retinopathy: Secondary | ICD-10-CM | POA: Diagnosis not present

## 2015-03-31 DIAGNOSIS — E108 Type 1 diabetes mellitus with unspecified complications: Secondary | ICD-10-CM | POA: Diagnosis not present

## 2015-03-31 DIAGNOSIS — I10 Essential (primary) hypertension: Secondary | ICD-10-CM | POA: Diagnosis not present

## 2015-05-03 ENCOUNTER — Emergency Department
Admission: EM | Admit: 2015-05-03 | Discharge: 2015-05-03 | Disposition: A | Discharge status: Home / Self Care | Payer: Commercial Managed Care - HMO | Attending: Emergency Medicine | Admitting: Emergency Medicine

## 2015-05-03 ENCOUNTER — Encounter: Payer: Self-pay | Admitting: Emergency Medicine

## 2015-05-03 ENCOUNTER — Emergency Department: Payer: Commercial Managed Care - HMO

## 2015-05-03 DIAGNOSIS — R739 Hyperglycemia, unspecified: Secondary | ICD-10-CM

## 2015-05-03 DIAGNOSIS — I10 Essential (primary) hypertension: Secondary | ICD-10-CM | POA: Diagnosis not present

## 2015-05-03 DIAGNOSIS — R1012 Left upper quadrant pain: Secondary | ICD-10-CM | POA: Diagnosis not present

## 2015-05-03 DIAGNOSIS — E1165 Type 2 diabetes mellitus with hyperglycemia: Secondary | ICD-10-CM | POA: Diagnosis not present

## 2015-05-03 HISTORY — DX: Type 2 diabetes mellitus without complications: E11.9

## 2015-05-03 HISTORY — DX: Essential (primary) hypertension: I10

## 2015-05-03 LAB — COMPREHENSIVE METABOLIC PANEL
ALT: 21 U/L (ref 14–54)
ANION GAP: 6 (ref 5–15)
AST: 37 U/L (ref 15–41)
Albumin: 3.8 g/dL (ref 3.5–5.0)
Alkaline Phosphatase: 71 U/L (ref 38–126)
BUN: 17 mg/dL (ref 6–20)
CHLORIDE: 104 mmol/L (ref 101–111)
CO2: 26 mmol/L (ref 22–32)
Calcium: 8.9 mg/dL (ref 8.9–10.3)
Creatinine, Ser: 0.98 mg/dL (ref 0.44–1.00)
GFR, EST NON AFRICAN AMERICAN: 55 mL/min — AB (ref >60)
Glucose, Bld: 371 mg/dL — ABNORMAL HIGH (ref 65–99)
POTASSIUM: 4 mmol/L (ref 3.5–5.1)
SODIUM: 136 mmol/L (ref 135–145)
Total Bilirubin: 1 mg/dL (ref 0.3–1.2)
Total Protein: 6.9 g/dL (ref 6.5–8.1)

## 2015-05-03 LAB — URINALYSIS COMPLETE WITH MICROSCOPIC (ARMC ONLY)
BACTERIA UA: NONE SEEN
Bilirubin Urine: NEGATIVE
HGB URINE DIPSTICK: NEGATIVE
KETONES UR: NEGATIVE mg/dL
LEUKOCYTES UA: NEGATIVE
NITRITE: NEGATIVE
PROTEIN: NEGATIVE mg/dL
SPECIFIC GRAVITY, URINE: 1.014 (ref 1.005–1.030)
pH: 5 (ref 5.0–8.0)

## 2015-05-03 LAB — CBC
HCT: 36.6 % (ref 35.0–47.0)
HEMOGLOBIN: 12.1 g/dL (ref 12.0–16.0)
MCH: 30.2 pg (ref 26.0–34.0)
MCHC: 33.1 g/dL (ref 32.0–36.0)
MCV: 91.1 fL (ref 80.0–100.0)
PLATELETS: 246 10*3/uL (ref 150–440)
RBC: 4.01 MIL/uL (ref 3.80–5.20)
RDW: 12.8 % (ref 11.5–14.5)
WBC: 6.9 10*3/uL (ref 3.6–11.0)

## 2015-05-03 LAB — LIPASE, BLOOD: Lipase: 24 U/L (ref 22–51)

## 2015-05-03 LAB — HEMOGLOBIN A1C: HEMOGLOBIN A1C: 11.1 % — AB (ref 4.0–6.0)

## 2015-05-03 MED ORDER — INSULIN ASPART 100 UNIT/ML ~~LOC~~ SOLN
10.0000 [IU] | Freq: Once | SUBCUTANEOUS | Status: AC
  Administered 2015-05-03: 10 [IU] via SUBCUTANEOUS
  Filled 2015-05-03: qty 10
  Filled 2015-05-03: qty 0.1

## 2015-05-03 MED ORDER — POLYETHYLENE GLYCOL 3350 17 G PO PACK: 17.0000 g | PACK | Freq: Every day | ORAL | Status: DC

## 2015-05-03 NOTE — ED Provider Notes (Signed)
[facility] Emergency Department Provider Note     Time seen: ----------------------------------------- 2:36 PM on 05/03/2015 -----------------------------------------    I have reviewed the triage vital signs and the nursing notes.   HISTORY  Chief Complaint Abdominal Pain    HPI Vicki [name] is a 74 y.o. female who presents ER for left-sided abdominal pain since Thursday. Patient states pain is severe with palpation of the left upper quadrant, states been moving her bowels properly. She reports some numbness in the left rib area, no rash. She denies any fevers, chills, chest pain, shortness of breath, nausea vomiting or diarrhea. Reports blood sugars been elevated over the last week. They've been as high as 600s.   Past Medical History  Diagnosis Date  . Diabetes mellitus without complication   . Hypertension     There are no active problems to display for this patient.   History reviewed. No pertinent past surgical history.  Allergies Review of patient's allergies indicates no known allergies.  Social History Social History  Substance Use Topics  . Smoking status: Never Smoker   . Smokeless tobacco: None  . Alcohol Use: No    Review of Systems Constitutional: Negative for fever. Eyes: Negative for visual changes. ENT: Negative for sore throat. Cardiovascular: Negative for chest pain. Respiratory: Negative for shortness of breath. Gastrointestinal: Positive for abdominal pain, negative for vomiting and diarrhea Genitourinary: Negative for dysuria. Musculoskeletal: Negative for back pain. Skin: Negative for rash. Neurological: Negative for headaches, focal weakness or numbness.  10-point ROS otherwise negative.  ____________________________________________   PHYSICAL EXAM:  VITAL SIGNS: ED Triage Vitals  Enc Vitals Group     BP 05/03/15 1102 150/103 mmHg     Pulse Rate 05/03/15 1102 79     Resp 05/03/15 1102 20   Temp 05/03/15 1102 98.2 F (36.8 C)     Temp Source 05/03/15 1102 Oral     SpO2 05/03/15 1102 97 %     Weight 05/03/15 1102 170 lb (77.111 kg)     Height 05/03/15 1102  (1.6 m)     Head Cir --      Peak Flow --      Pain Score 05/03/15 1103 10     Pain Loc --      Pain Edu? --      Excl. in GC? --     Constitutional: Alert and oriented. Well appearing and in no distress. Eyes: Conjunctivae are normal. PERRL. Normal extraocular movements. ENT   Head: Normocephalic and atraumatic.   Nose: No congestion/rhinnorhea.   Mouth/Throat: Mucous membranes are moist.   Neck: No stridor. Cardiovascular: Normal rate, regular rhythm. Normal and symmetric distal pulses are present in all extremities. No murmurs, rubs, or gallops. Respiratory: Normal respiratory effort without tachypnea nor retractions. Breath sounds are clear and equal bilaterally. No wheezes/rales/rhonchi. Gastrointestinal: Soft but remarkably tender across the left flank and upper quadrant. No rebound or guarding. The skin is even sore to touch. Musculoskeletal: Nontender with normal range of motion in all extremities. No joint effusions.  No lower extremity tenderness nor edema. Neurologic:  Normal speech and language. No gross focal neurologic deficits are appreciated. Speech is normal. No gait instability. Skin:  Skin is warm, dry and intact. No rash noted. Psychiatric: Mood and affect are normal. Speech and behavior are normal. Patient exhibits appropriate insight and judgment.  EKG: Interpreted by me, normal sinus rhythm with a rate of 72 bpm, normal PR interval, normal QRS with, likely anterior  infarct age indeterminate.  ____________________________________________  ED COURSE:  Pertinent labs & imaging results that were available during my care of the patient were reviewed by me and considered in my medical decision making (see chart for details). Patient will need abdominal labs, will check her blood  sugar. ____________________________________________    LABS (pertinent positives/negatives)  Labs Reviewed  COMPREHENSIVE METABOLIC PANEL - Abnormal; Notable for the following:    Glucose, Bld 371 (*)    GFR calc non Af Amer 55 (*)    All other components within normal limits  URINALYSIS COMPLETEWITH MICROSCOPIC ([facility] ONLY) - Abnormal; Notable for the following:    Color, Urine YELLOW (*)    APPearance CLEAR (*)    Glucose, UA >500 (*)    Squamous Epithelial / LPF 0-5 (*)    All other components within normal limits  CBC  LIPASE, BLOOD    RADIOLOGY Images were viewed by me  CT renal protocol IMPRESSION: 1. No acute abdominal/pelvic findings, mass lesions or adenopathy on this non contrasted examination. 2. No renal, ureteral or bladder calculi. ____________________________________________  FINAL ASSESSMENT AND PLAN  Flank pain, hyperglycemia  Plan: Patient with labs and imaging as dictated above. Patient was given subcutaneous regular insulin for her hyperglycemia. Unclear etiology for the hyperglycemia. CT scan was performed as dictated above. Patient will be prescribed MiraLAX and given mild pain medication for her symptoms. She is advised to increase Toujeo to 35 units nightly. She is currently taken 32 units. She can have close follow-up with endocrinology for reevaluation.   [name], MD   [name], MD 05/03/15 [phone]

## 2015-05-03 NOTE — ED Notes (Signed)
Pt has had pain in her upper left and right quadrants x 2 weeks and it has gotten progressively worse. Pt denies n/v/d or any other symptoms. Pain was 10/10 earlier, but she states it is 8/10 now.

## 2015-05-03 NOTE — ED Notes (Signed)
Pt to ed with c/o left upper quad pain since last Thursday.  Pt states pain severe with palpation of left upper quad.  Pt also reports numbness in left rib area. Pt denies diarrhea, denies vomiting.

## 2015-05-03 NOTE — Discharge Instructions (Signed)
Abdominal Pain °Many things can cause abdominal pain. Usually, abdominal pain is not caused by a disease and will improve without treatment. It can often be observed and treated at home. Your health care provider will do a physical exam and possibly order blood tests and X-rays to help determine the seriousness of your pain. However, in many cases, more time must pass before a clear cause of the pain can be found. Before that point, your health care provider may not know if you need more testing or further treatment. °HOME CARE INSTRUCTIONS  °Monitor your abdominal pain for any changes. The following actions may help to alleviate any discomfort you are experiencing: °· Only take over-the-counter or prescription medicines as directed by your health care provider. °· Do not take laxatives unless directed to do so by your health care provider. °· Try a clear liquid diet (broth, tea, or water) as directed by your health care provider. Slowly move to a bland diet as tolerated. °SEEK MEDICAL CARE IF: °· You have unexplained abdominal pain. °· You have abdominal pain associated with nausea or diarrhea. °· You have pain when you urinate or have a bowel movement. °· You experience abdominal pain that wakes you in the night. °· You have abdominal pain that is worsened or improved by eating food. °· You have abdominal pain that is worsened with eating fatty foods. °· You have a fever. °SEEK IMMEDIATE MEDICAL CARE IF:  °· Your pain does not go away within 2 hours. °· You keep throwing up (vomiting). °· Your pain is felt only in portions of the abdomen, such as the right side or the left lower portion of the abdomen. °· You pass bloody or black tarry stools. °MAKE SURE YOU: °· Understand these instructions.   °· Will watch your condition.   °· Will get help right away if you are not doing well or get worse.   °Document Released: 05/01/2005 Document Revised: 07/27/2013 Document Reviewed: 03/31/2013 °[employer]® Patient Information  ©2015 [employer]. This information is not intended to replace advice given to you by your health care provider. Make sure you discuss any questions you have with your health care provider. ° °Hyperglycemia °Hyperglycemia occurs when the glucose (sugar) in your blood is too high. Hyperglycemia can happen for many reasons, but it most often happens to people who do not know they have diabetes or are not managing their diabetes properly.  °CAUSES  °Whether you have diabetes or not, there are other causes of hyperglycemia. Hyperglycemia can occur when you have diabetes, but it can also occur in other situations that you might not be as aware of, such as: °Diabetes °· If you have diabetes and are having problems controlling your blood glucose, hyperglycemia could occur because of some of the following reasons: °¨ Not following your meal plan. °¨ Not taking your diabetes medications or not taking it properly. °¨ Exercising less or doing less activity than you normally do. °¨ Being sick. °Pre-diabetes °· This cannot be ignored. Before people develop Type 2 diabetes, they almost always have "pre-diabetes." This is when your blood glucose levels are higher than normal, but not yet high enough to be diagnosed as diabetes. Research has shown that some long-term damage to the body, especially the heart and circulatory system, may already be occurring during pre-diabetes. If you take action to manage your blood glucose when you have pre-diabetes, you may delay or prevent Type 2 diabetes from developing. °Stress °· If you have diabetes, you may be "diet" controlled   or on oral medications or insulin to control your diabetes. However, you may find that your blood glucose is higher than usual in the hospital whether you have diabetes or not. This is often referred to as "stress hyperglycemia." Stress can elevate your blood glucose. This happens because of hormones put out by the body during times of stress. If stress has been the  cause of your high blood glucose, it can be followed regularly by your caregiver. That way he/she can make sure your hyperglycemia does not continue to get worse or progress to diabetes. °Steroids °· Steroids are medications that act on the infection fighting system (immune system) to block inflammation or infection. One side effect can be a rise in blood glucose. Most people can produce enough extra insulin to allow for this rise, but for those who cannot, steroids make blood glucose levels go even higher. It is not unusual for steroid treatments to "uncover" diabetes that is developing. It is not always possible to determine if the hyperglycemia will go away after the steroids are stopped. A special blood test called an A1c is sometimes done to determine if your blood glucose was elevated before the steroids were started. °SYMPTOMS °· Thirsty. °· Frequent urination. °· Dry mouth. °· Blurred vision. °· Tired or fatigue. °· Weakness. °· Sleepy. °· Tingling in feet or leg. °DIAGNOSIS  °Diagnosis is made by monitoring blood glucose in one or all of the following ways: °· A1c test. This is a chemical found in your blood. °· Fingerstick blood glucose monitoring. °· Laboratory results. °TREATMENT  °First, knowing the cause of the hyperglycemia is important before the hyperglycemia can be treated. Treatment may include, but is not be limited to: °· Education. °· Change or adjustment in medications. °· Change or adjustment in meal plan. °· Treatment for an illness, infection, etc. °· More frequent blood glucose monitoring. °· Change in exercise plan. °· Decreasing or stopping steroids. °· Lifestyle changes. °HOME CARE INSTRUCTIONS  °· Test your blood glucose as directed. °· Exercise regularly. Your caregiver will give you instructions about exercise. Pre-diabetes or diabetes which comes on with stress is helped by exercising. °· Eat wholesome, balanced meals. Eat often and at regular, fixed times. Your caregiver or  nutritionist will give you a meal plan to guide your sugar intake. °· Being at an ideal weight is important. If needed, losing as little as 10 to 15 pounds may help improve blood glucose levels. °SEEK MEDICAL CARE IF:  °· You have questions about medicine, activity, or diet. °· You continue to have symptoms (problems such as increased thirst, urination, or weight gain). °SEEK IMMEDIATE MEDICAL CARE IF:  °· You are vomiting or have diarrhea. °· Your breath smells fruity. °· You are breathing faster or slower. °· You are very sleepy or incoherent. °· You have numbness, tingling, or pain in your feet or hands. °· You have chest pain. °· Your symptoms get worse even though you have been following your caregiver's orders. °· If you have any other questions or concerns. °Document Released: 01/15/2001 Document Revised: 10/14/2011 Document Reviewed: 11/18/2011 °ExitCare® Patient Information ©2015 [employer]. This information is not intended to replace advice given to you by your health care provider. Make sure you discuss any questions you have with your health care provider. ° °

## 2015-05-08 DIAGNOSIS — E1065 Type 1 diabetes mellitus with hyperglycemia: Secondary | ICD-10-CM | POA: Diagnosis not present

## 2015-05-08 DIAGNOSIS — E108 Type 1 diabetes mellitus with unspecified complications: Secondary | ICD-10-CM | POA: Diagnosis not present

## 2015-05-08 DIAGNOSIS — H3500 Unspecified background retinopathy: Secondary | ICD-10-CM | POA: Diagnosis not present

## 2015-05-08 DIAGNOSIS — Z23 Encounter for immunization: Secondary | ICD-10-CM | POA: Diagnosis not present

## 2015-05-22 ENCOUNTER — Encounter: Payer: Self-pay | Admitting: Emergency Medicine

## 2015-05-22 ENCOUNTER — Emergency Department
Admission: EM | Admit: 2015-05-22 | Discharge: 2015-05-22 | Disposition: A | Discharge status: Home / Self Care | Payer: Commercial Managed Care - HMO | Attending: Emergency Medicine | Admitting: Emergency Medicine

## 2015-05-22 DIAGNOSIS — R079 Chest pain, unspecified: Secondary | ICD-10-CM | POA: Diagnosis present

## 2015-05-22 DIAGNOSIS — E119 Type 2 diabetes mellitus without complications: Secondary | ICD-10-CM | POA: Insufficient documentation

## 2015-05-22 DIAGNOSIS — R0789 Other chest pain: Secondary | ICD-10-CM | POA: Diagnosis not present

## 2015-05-22 DIAGNOSIS — Z79899 Other long term (current) drug therapy: Secondary | ICD-10-CM | POA: Diagnosis not present

## 2015-05-22 DIAGNOSIS — I10 Essential (primary) hypertension: Secondary | ICD-10-CM | POA: Insufficient documentation

## 2015-05-22 LAB — CBC WITH DIFFERENTIAL/PLATELET
BASOS ABS: 0 10*3/uL (ref 0–0.1)
Basophils Relative: 1 %
EOS ABS: 0.4 10*3/uL (ref 0–0.7)
EOS PCT: 5 %
HCT: 37 % (ref 35.0–47.0)
Hemoglobin: 12.1 g/dL (ref 12.0–16.0)
LYMPHS ABS: 2.9 10*3/uL (ref 1.0–3.6)
LYMPHS PCT: 44 %
MCH: 30.1 pg (ref 26.0–34.0)
MCHC: 32.8 g/dL (ref 32.0–36.0)
MCV: 91.9 fL (ref 80.0–100.0)
MONO ABS: 0.6 10*3/uL (ref 0.2–0.9)
Monocytes Relative: 9 %
Neutro Abs: 2.7 10*3/uL (ref 1.4–6.5)
Neutrophils Relative %: 41 %
PLATELETS: 262 10*3/uL (ref 150–440)
RBC: 4.02 MIL/uL (ref 3.80–5.20)
RDW: 12.9 % (ref 11.5–14.5)
WBC: 6.6 10*3/uL (ref 3.6–11.0)

## 2015-05-22 LAB — COMPREHENSIVE METABOLIC PANEL
ALBUMIN: 3.9 g/dL (ref 3.5–5.0)
ALT: 19 U/L (ref 14–54)
ANION GAP: 7 (ref 5–15)
AST: 25 U/L (ref 15–41)
Alkaline Phosphatase: 62 U/L (ref 38–126)
BILIRUBIN TOTAL: 0.5 mg/dL (ref 0.3–1.2)
BUN: 22 mg/dL — AB (ref 6–20)
CHLORIDE: 105 mmol/L (ref 101–111)
CO2: 26 mmol/L (ref 22–32)
Calcium: 9.1 mg/dL (ref 8.9–10.3)
Creatinine, Ser: 0.87 mg/dL (ref 0.44–1.00)
GFR calc Af Amer: >60 mL/min (ref >60)
GFR calc non Af Amer: >60 mL/min (ref >60)
GLUCOSE: 338 mg/dL — AB (ref 65–99)
POTASSIUM: 4.3 mmol/L (ref 3.5–5.1)
Sodium: 138 mmol/L (ref 135–145)
TOTAL PROTEIN: 7.1 g/dL (ref 6.5–8.1)

## 2015-05-22 LAB — URINALYSIS COMPLETE WITH MICROSCOPIC (ARMC ONLY)
BILIRUBIN URINE: NEGATIVE
Bacteria, UA: NONE SEEN
Glucose, UA: >500 mg/dL — AB
HGB URINE DIPSTICK: NEGATIVE
KETONES UR: NEGATIVE mg/dL
NITRITE: NEGATIVE
PH: 5 (ref 5.0–8.0)
Protein, ur: NEGATIVE mg/dL
SPECIFIC GRAVITY, URINE: 1.025 (ref 1.005–1.030)

## 2015-05-22 LAB — LIPASE, BLOOD: LIPASE: 25 U/L (ref 22–51)

## 2015-05-22 NOTE — ED Notes (Addendum)
Left side pain.  States seen in ED for same in the past, pain intermittently returns.  C/o left flank and left upper quadrant abdominal pain.  Also complain of "dark stools" for a while.

## 2015-05-22 NOTE — ED Provider Notes (Signed)
[facility] Emergency Department Provider Note   ____________________________________________  Time seen: 1710  I have reviewed the triage vital signs and the nursing notes.   HISTORY  Chief Complaint Flank Pain   History limited by: Not Limited   HPI Vicki [name] is a 74 y.o. female who presents to the emergency department today with concerns for left lower chest pain. Patient states that this pain is been going on for over a month. She states she was seen in the emergency department last month for the same symptoms. She states that they have not gotten any better since she was seen. She has been taking the medication that was given to her although she is unsure what medication that was. She denies any associated shortness of breath. She does state that the pain is sharp. It is very intermittent. It is worse with palpation to her chest wall. She denies any fevers.   Past Medical History  Diagnosis Date  . Diabetes mellitus without complication (HCC)   . Hypertension     There are no active problems to display for this patient.   History reviewed. No pertinent past surgical history.  Current Outpatient Rx  Name  Route  Sig  Dispense  Refill  . polyethylene glycol (MIRALAX / GLYCOLAX) packet   Oral   Take 17 g by mouth daily.   14 each   0     Allergies Review of patient's allergies indicates no known allergies.  No family history on file.  Social History Social History  Substance Use Topics  . Smoking status: Never Smoker   . Smokeless tobacco: None  . Alcohol Use: No    Review of Systems  Constitutional: Negative for fever. Cardiovascular: Left lower chest pain Respiratory: Negative for shortness of breath. Gastrointestinal: Negative for abdominal pain, vomiting and diarrhea. Genitourinary: Negative for dysuria. Musculoskeletal: Negative for back pain. Skin: Negative for rash. Neurological: Negative for headaches, focal  weakness or numbness.  10-point ROS otherwise negative.  ____________________________________________   PHYSICAL EXAM:  VITAL SIGNS: ED Triage Vitals  Enc Vitals Group     BP 05/22/15 1410 170/112 mmHg     Pulse Rate 05/22/15 1410 65     Resp 05/22/15 1410 18     Temp 05/22/15 1410 98.2 F (36.8 C)     Temp Source 05/22/15 1410 Oral     SpO2 05/22/15 1410 98 %     Weight 05/22/15 1410 158 lb (71.668 kg)     Height 05/22/15 1410  (1.651 m)     Head Cir --      Peak Flow --    Constitutional: Alert and oriented. Well appearing and in no distress. Eyes: Conjunctivae are normal. PERRL. Normal extraocular movements. ENT   Head: Normocephalic and atraumatic.   Nose: No congestion/rhinnorhea.   Mouth/Throat: Mucous membranes are moist.   Neck: No stridor. Hematological/Lymphatic/Immunilogical: No cervical lymphadenopathy. Cardiovascular: Normal rate, regular rhythm.  No murmurs, rubs, or gallops. Respiratory: Normal respiratory effort without tachypnea nor retractions. Breath sounds are clear and equal bilaterally. No wheezes/rales/rhonchi. Gastrointestinal: Soft and nontender. No distention.  Genitourinary: Deferred Musculoskeletal: Normal range of motion in all extremities. No joint effusions.  No lower extremity tenderness nor edema. There is tenderness to palpation of the left lower chest. The patient states that this is the same tenderness as the pain she has been experiencing. Neurologic:  Normal speech and language. No gross focal neurologic deficits are appreciated. Speech is normal.  Skin:  Skin is warm, dry and intact. No rash noted. Psychiatric: Mood and affect are normal. Speech and behavior are normal. Patient exhibits appropriate insight and judgment.  ____________________________________________    LABS (pertinent positives/negatives)  Labs Reviewed  COMPREHENSIVE METABOLIC PANEL - Abnormal; Notable for the following:    Glucose, Bld 338 (*)     BUN 22 (*)    All other components within normal limits  URINALYSIS COMPLETEWITH MICROSCOPIC ([facility] ONLY) - Abnormal; Notable for the following:    Color, Urine AMBER (*)    APPearance CLEAR (*)    Glucose, UA >500 (*)    Leukocytes, UA 1+ (*)    Squamous Epithelial / LPF 0-5 (*)    All other components within normal limits  CBC WITH DIFFERENTIAL/PLATELET  LIPASE, BLOOD     ____________________________________________   EKG  I, Phineas SemenGraydon Helyne Genther, attending physician, personally viewed and interpreted this EKG  EKG Time: 1429 Rate: 60 Rhythm: NSR Axis: normal Intervals: qtc 424 QRS: narrow, q waves in V1, V2 ST changes: no st elevation Impression: Abnormal EKG   ____________________________________________    RADIOLOGY  None   ____________________________________________   PROCEDURES  Procedure(s) performed: None  Critical Care performed: No  ____________________________________________   INITIAL IMPRESSION / ASSESSMENT AND PLAN / ED COURSE  Pertinent labs & imaging results that were available during my care of the patient were reviewed by me and considered in my medical decision making (see chart for details).  Patient presents to the emergency department today with concerns for intermittent left lower chest pain. This is sharp in nature. Patient has had this pain for over one month. On exam patient is tender to palpation in the left lower chest which does reproduce the pain she is complaining of. At this point I highly doubt ACS. Patient did undergo a CT renal stone protocol last time she was here without any concerning findings. I don't think repeat emergent imaging is required at this time. I did discussed with patient importance of following up with primary care doctor. I think likely patient with muscle skeletal source of the pain.  ____________________________________________   FINAL CLINICAL IMPRESSION(S) / ED DIAGNOSES  Final diagnoses:  Chest  wall pain     Phineas SemenGraydon Orville Mena, MD 05/22/15 [id number]

## 2015-05-22 NOTE — Discharge Instructions (Signed)
Please seek medical attention for any high fevers, chest pain, shortness of breath, change in behavior, persistent vomiting, bloody stool or any other new or concerning symptoms. ° ° °Chest Wall Pain °Chest wall pain is pain in or around the bones and muscles of your chest. Sometimes, an injury causes this pain. Sometimes, the cause may not be known. This pain may take several weeks or longer to get better. °HOME CARE INSTRUCTIONS  °Pay attention to any changes in your symptoms. Take these actions to help with your pain:  °· Rest as told by your health care provider.   °· Avoid activities that cause pain. These include any activities that use your chest muscles or your abdominal and side muscles to lift heavy items.    °· If directed, apply ice to the painful area: °¨ Put ice in a plastic bag. °¨ Place a towel between your skin and the bag. °¨ Leave the ice on for 20 minutes, 2-3 times per day. °· Take over-the-counter and prescription medicines only as told by your health care provider. °· Do not use tobacco products, including cigarettes, chewing tobacco, and e-cigarettes. If you need help quitting, ask your health care provider. °· Keep all follow-up visits as told by your health care provider. This is important. °SEEK MEDICAL CARE IF: °· You have a fever. °· Your chest pain becomes worse. °· You have new symptoms. °SEEK IMMEDIATE MEDICAL CARE IF: °· You have nausea or vomiting. °· You feel sweaty or light-headed. °· You have a cough with phlegm (sputum) or you cough up blood. °· You develop shortness of breath. °  °This information is not intended to replace advice given to you by your health care provider. Make sure you discuss any questions you have with your health care provider. °  °Document Released: 07/22/2005 Document Revised: 04/12/2015 Document Reviewed: 10/17/2014 °Elsevier Interactive Patient Education ©2016 Elsevier Inc. ° °

## 2015-06-10 ENCOUNTER — Encounter: Payer: Self-pay | Admitting: Emergency Medicine

## 2015-06-10 ENCOUNTER — Emergency Department: Payer: Commercial Managed Care - HMO

## 2015-06-10 ENCOUNTER — Emergency Department
Admission: EM | Admit: 2015-06-10 | Discharge: 2015-06-10 | Disposition: A | Discharge status: Home / Self Care | Payer: Commercial Managed Care - HMO | Attending: Emergency Medicine | Admitting: Emergency Medicine

## 2015-06-10 DIAGNOSIS — I10 Essential (primary) hypertension: Secondary | ICD-10-CM | POA: Diagnosis not present

## 2015-06-10 DIAGNOSIS — R0789 Other chest pain: Secondary | ICD-10-CM | POA: Insufficient documentation

## 2015-06-10 DIAGNOSIS — Z79899 Other long term (current) drug therapy: Secondary | ICD-10-CM | POA: Insufficient documentation

## 2015-06-10 DIAGNOSIS — E119 Type 2 diabetes mellitus without complications: Secondary | ICD-10-CM | POA: Insufficient documentation

## 2015-06-10 DIAGNOSIS — R079 Chest pain, unspecified: Secondary | ICD-10-CM | POA: Diagnosis not present

## 2015-06-10 LAB — CBC WITH DIFFERENTIAL/PLATELET
BASOS PCT: 1 %
Basophils Absolute: 0 10*3/uL (ref 0–0.1)
Eosinophils Absolute: 0.2 10*3/uL (ref 0–0.7)
Eosinophils Relative: 4 %
HEMATOCRIT: 37.2 % (ref 35.0–47.0)
Hemoglobin: 12.2 g/dL (ref 12.0–16.0)
Lymphocytes Relative: 37 %
Lymphs Abs: 2.4 10*3/uL (ref 1.0–3.6)
MCH: 30.7 pg (ref 26.0–34.0)
MCHC: 32.8 g/dL (ref 32.0–36.0)
MCV: 93.5 fL (ref 80.0–100.0)
MONO ABS: 0.5 10*3/uL (ref 0.2–0.9)
MONOS PCT: 7 %
NEUTROS ABS: 3.3 10*3/uL (ref 1.4–6.5)
Neutrophils Relative %: 51 %
Platelets: 249 10*3/uL (ref 150–440)
RBC: 3.98 MIL/uL (ref 3.80–5.20)
RDW: 13.1 % (ref 11.5–14.5)
WBC: 6.4 10*3/uL (ref 3.6–11.0)

## 2015-06-10 LAB — BASIC METABOLIC PANEL
ANION GAP: 9 (ref 5–15)
BUN: 20 mg/dL (ref 6–20)
CALCIUM: 8.7 mg/dL — AB (ref 8.9–10.3)
CO2: 23 mmol/L (ref 22–32)
CREATININE: 1.28 mg/dL — AB (ref 0.44–1.00)
Chloride: 100 mmol/L — ABNORMAL LOW (ref 101–111)
GFR calc Af Amer: 46 mL/min — ABNORMAL LOW (ref >60)
GFR calc non Af Amer: 40 mL/min — ABNORMAL LOW (ref >60)
GLUCOSE: 558 mg/dL — AB (ref 65–99)
Potassium: 4.8 mmol/L (ref 3.5–5.1)
Sodium: 132 mmol/L — ABNORMAL LOW (ref 135–145)

## 2015-06-10 LAB — TROPONIN I: Troponin I: <0.03 ng/mL (ref <0.031)

## 2015-06-10 LAB — CK: CK TOTAL: 562 U/L — AB (ref 38–234)

## 2015-06-10 MED ORDER — CLONIDINE HCL 0.1 MG PO TABS
0.2000 mg | ORAL_TABLET | Freq: Once | ORAL | Status: AC
  Administered 2015-06-10: 0.2 mg via ORAL
  Filled 2015-06-10: qty 2

## 2015-06-10 MED ORDER — LIDOCAINE 5 % EX PTCH: 1.0000 | MEDICATED_PATCH | Freq: Two times a day (BID) | CUTANEOUS | Status: AC

## 2015-06-10 MED ORDER — KETOROLAC TROMETHAMINE 30 MG/ML IJ SOLN
15.0000 mg | Freq: Once | INTRAMUSCULAR | Status: AC
  Administered 2015-06-10: 15 mg via INTRAVENOUS
  Filled 2015-06-10: qty 1

## 2015-06-10 MED ORDER — KETOROLAC TROMETHAMINE 30 MG/ML IJ SOLN: 30.0000 mg | Freq: Once | INTRAMUSCULAR | Status: DC

## 2015-06-10 NOTE — ED Notes (Addendum)
Pt to ed with c/o left rib pain x 2 weeks.  Reports she fell out of bed about 2 months ago.  Reports tylenol makes pain better.  Pt bp elevated at triage, however, pt states she has not taken morning bp meds today.

## 2015-06-10 NOTE — ED Provider Notes (Signed)
Time Seen: Approximately  ----------------------------------------- 11:26 AM on 06/10/2015 -----------------------------------------    I have reviewed the triage notes  Chief Complaint: Chest Pain   History of Present Illness: Vicki [name] is a 74 y.o. female who presents with sharp intermittent left-sided chest wall pain that's been occurring now for the last month or greater. The patient states that she went to the outpatient clinic today and her blood pressure was elevated. She tells me that she hasn't had her normal morning blood pressure medication at this point. Her chest pains been evaluated before but reviewed the record doesn't show where she had a simple PAP chest x-ray*. She points to an area of left axillary approximately T8-T10 that's shows reproducible pain when she presses on that area. She is not noticed any rashes at home. She denies any nausea, vomiting, shortness of breath. Pain is occasionally exacerbated by movement. She states she fell out of her bed 2 months ago and did strike the left side of her chest. Patient reports that home Tylenol usage makes her pain feel better.   Past Medical History  Diagnosis Date  . Diabetes mellitus without complication (HCC)   . Hypertension     There are no active problems to display for this patient.   History reviewed. No pertinent past surgical history.  History reviewed. No pertinent past surgical history.  Current Outpatient Rx  Name  Route  Sig  Dispense  Refill  . polyethylene glycol (MIRALAX / GLYCOLAX) packet   Oral   Take 17 g by mouth daily.   14 each   0     Allergies:  Review of patient's allergies indicates no known allergies.  Family History: History reviewed. No pertinent family history.  Social History: Social History  Substance Use Topics  . Smoking status: Never Smoker   . Smokeless tobacco: None  . Alcohol Use: No     Review of Systems:   10 point review of systems was performed  and was otherwise negative:  Constitutional: No fever Eyes: No visual disturbances ENT: No sore throat, ear pain Cardiac: Chest wall pain described above Respiratory: No shortness of breath, wheezing, or stridor Abdomen: No abdominal pain, no vomiting, No diarrhea Endocrine: No weight loss, No night sweats Extremities: No peripheral edema, cyanosis Skin: No rashes, easy bruising Neurologic: No focal weakness, trouble with speech or swollowing Urologic: No dysuria, Hematuria, or urinary frequency   Physical Exam:  ED Triage Vitals  Enc Vitals Group     BP 06/10/15 1104 184/108 mmHg     Pulse Rate 06/10/15 1104 64     Resp 06/10/15 1104 20     Temp 06/10/15 1104 98.6 F (37 C)     Temp Source 06/10/15 1104 Oral     SpO2 06/10/15 1104 97 %     Weight 06/10/15 1104 158 lb (71.668 kg)     Height 06/10/15 1104  (1.575 m)     Head Cir --      Peak Flow --      Pain Score 06/10/15 1105 8     Pain Loc --      Pain Edu? --      Excl. in GC? --     General: Awake , Alert , and Oriented times 3; GCS 15 Head: Normal cephalic , atraumatic Eyes: Pupils equal , round, reactive to light Nose/Throat: No nasal drainage, patent upper airway without erythema or exudate.  Neck: Supple, Full range of motion, No anterior adenopathy  or palpable thyroid masses Lungs: Clear to ascultation without wheezes , rhonchi, or rales Heart: Regular rate, regular rhythm without murmurs , gallops , or rubs Abdomen: Soft, non tender without rebound, guarding , or rigidity; bowel sounds positive and symmetric in all 4 quadrants. No organomegaly .        Extremities: 2 plus symmetric pulses. No edema, clubbing or cyanosis Neurologic: normal ambulation, Motor symmetric without deficits, sensory intact Skin: warm, dry, no rashes Patient has point tenderness T8 T10 area left side no rash no crepitus or step-off is noted  Labs:   All laboratory work was reviewed including any pertinent negatives or  positives listed below:  Labs Reviewed  BASIC METABOLIC PANEL  CBC WITH DIFFERENTIAL/PLATELET  TROPONIN I  CK    EKG: ED ECG REPORT I, Jennye MoccasinBrian S Ahijah Devery, the attending physician, personally viewed and interpreted this ECG.  Date: 06/10/2015 EKG Time: 1106 Rate: 66 QRS Axis: normal Intervals: normal ST/T Wave abnormalities: normal Conduction Disutrbances: none Narrative Interpretation: unremarkable Poor R-wave progression in the anterior leads without any ischemic changes   Radiology:    EXAM: CHEST 2 VIEW  COMPARISON: No priors.  FINDINGS: Lung volumes are normal. No consolidative airspace disease. No pleural effusions. No pneumothorax. No pulmonary nodule or mass noted. Pulmonary vasculature and the cardiomediastinal silhouette are within normal limits. Surgical clips project over the right upper quadrant of the abdomen, compatible with prior cholecystectomy.  IMPRESSION: No radiographic evidence of acute cardiopulmonary disease.  I personally reviewed the radiologic studies      ED Course:  The patient tells us that she did not take her complete insulin dosage nor her blood pressure medications this morning prior to going to her clinic visit. It is hard for us to intervene on giving her a lot of medication at this time since she hasn't had her normal home meds prior to arrival. Patient's has hyperglycemia but does not appear to be in diabetic ketoacidosis. Patient was screened for rhabdomyolysis due to the fall persistent pain which overall was an elevated CK but I felt was not in the range of rhabdomyolysis. Her pain being very reproducible and felt was unlikely to be a pulmonary embolism, acute coronary syndrome, or other life-threatening intrathoracic pathology. Chest x-ray here was negative for any obvious fracture or hematoma, etc. The patient was prescribed lidocaine patch on an outpatient basis and advised continue over-the-counter Tylenol and ibuprofen as  they have relieved her discomfort all questions and concerns were addressed at the bedside   Assessment: * Left-sided chest wall pain Hyperglycemia (insulin-dependent diabetes) Hypertension    Plan:  Outpatient management Patient was advised to return immediately if condition worsens. Patient was advised to follow up with her primary care physician or other specialized physicians involved and in their current assessment.             Jennye MoccasinBrian S Emmry Hinsch, MD 06/10/15 [id number]

## 2015-06-10 NOTE — ED Notes (Signed)
[facility] due to elevated blood pressure.  Pt also complaining of pain in left side along ribcage.  Pt A/Ox4, states she forgot to take BP medicine this morning. MD at bedside.

## 2015-06-10 NOTE — ED Notes (Signed)
Critical blood glucose of 558 called by lab; read back and verified. MD notified.

## 2015-06-10 NOTE — ED Notes (Signed)
Left axilla pain x2 weeks , [facility] pt noted to be hypertensive 214/89,   Tylenol helps

## 2015-06-10 NOTE — Discharge Instructions (Signed)
Chest Wall Pain Chest wall pain is pain in or around the bones and muscles of your chest. Sometimes, an injury causes this pain. Sometimes, the cause may not be known. This pain may take several weeks or longer to get better. HOME CARE INSTRUCTIONS  Pay attention to any changes in your symptoms. Take these actions to help with your pain:   Rest as told by your health care provider.   Avoid activities that cause pain. These include any activities that use your chest muscles or your abdominal and side muscles to lift heavy items.   If directed, apply ice to the painful area:  Put ice in a plastic bag.  Place a towel between your skin and the bag.  Leave the ice on for 20 minutes, 2-3 times per day.  Take over-the-counter and prescription medicines only as told by your health care provider.  Do not use tobacco products, including cigarettes, chewing tobacco, and e-cigarettes. If you need help quitting, ask your health care provider.  Keep all follow-up visits as told by your health care provider. This is important. SEEK MEDICAL CARE IF:  You have a fever.  Your chest pain becomes worse.  You have new symptoms. SEEK IMMEDIATE MEDICAL CARE IF:  You have nausea or vomiting.  You feel sweaty or light-headed.  You have a cough with phlegm (sputum) or you cough up blood.  You develop shortness of breath.   This information is not intended to replace advice given to you by your health care provider. Make sure you discuss any questions you have with your health care provider.   Document Released: 07/22/2005 Document Revised: 04/12/2015 Document Reviewed: 10/17/2014 Elsevier Interactive Patient Education [employer]2016 [employer].  Please return immediately if condition worsens. Please contact her primary physician or the physician you were given for referral. If you have any specialist physicians involved in her treatment and plan please also contact them. Thank you for using [facility]  [facility]. Please take his normal insulin dosage at home along with continuing her blood pressure medications.

## 2015-06-10 NOTE — ED Notes (Signed)
Patient transported to X-ray 

## 2015-07-12 DIAGNOSIS — H3500 Unspecified background retinopathy: Secondary | ICD-10-CM | POA: Diagnosis not present

## 2015-07-12 DIAGNOSIS — E1065 Type 1 diabetes mellitus with hyperglycemia: Secondary | ICD-10-CM | POA: Diagnosis not present

## 2015-07-12 DIAGNOSIS — E108 Type 1 diabetes mellitus with unspecified complications: Secondary | ICD-10-CM | POA: Diagnosis not present

## 2015-09-25 ENCOUNTER — Emergency Department: Payer: Commercial Managed Care - HMO

## 2015-09-25 ENCOUNTER — Inpatient Hospital Stay
Admission: EM | Admit: 2015-09-25 | Discharge: 2015-09-28 | DRG: 638 | Disposition: A | Discharge status: Home / Self Care | Payer: Commercial Managed Care - HMO | Attending: Internal Medicine | Admitting: Internal Medicine

## 2015-09-25 ENCOUNTER — Encounter: Payer: Self-pay | Admitting: Emergency Medicine

## 2015-09-25 DIAGNOSIS — Z794 Long term (current) use of insulin: Secondary | ICD-10-CM | POA: Diagnosis not present

## 2015-09-25 DIAGNOSIS — K2970 Gastritis, unspecified, without bleeding: Secondary | ICD-10-CM | POA: Diagnosis present

## 2015-09-25 DIAGNOSIS — E1310 Other specified diabetes mellitus with ketoacidosis without coma: Secondary | ICD-10-CM | POA: Diagnosis not present

## 2015-09-25 DIAGNOSIS — N183 Chronic kidney disease, stage 3 (moderate): Secondary | ICD-10-CM | POA: Diagnosis not present

## 2015-09-25 DIAGNOSIS — I129 Hypertensive chronic kidney disease with stage 1 through stage 4 chronic kidney disease, or unspecified chronic kidney disease: Secondary | ICD-10-CM | POA: Diagnosis present

## 2015-09-25 DIAGNOSIS — K219 Gastro-esophageal reflux disease without esophagitis: Secondary | ICD-10-CM | POA: Diagnosis not present

## 2015-09-25 DIAGNOSIS — R778 Other specified abnormalities of plasma proteins: Secondary | ICD-10-CM

## 2015-09-25 DIAGNOSIS — Z833 Family history of diabetes mellitus: Secondary | ICD-10-CM | POA: Diagnosis not present

## 2015-09-25 DIAGNOSIS — R0789 Other chest pain: Secondary | ICD-10-CM | POA: Diagnosis not present

## 2015-09-25 DIAGNOSIS — R7989 Other specified abnormal findings of blood chemistry: Secondary | ICD-10-CM

## 2015-09-25 DIAGNOSIS — E1110 Type 2 diabetes mellitus with ketoacidosis without coma: Secondary | ICD-10-CM | POA: Diagnosis present

## 2015-09-25 DIAGNOSIS — R0602 Shortness of breath: Secondary | ICD-10-CM | POA: Diagnosis not present

## 2015-09-25 DIAGNOSIS — E1169 Type 2 diabetes mellitus with other specified complication: Secondary | ICD-10-CM | POA: Diagnosis not present

## 2015-09-25 DIAGNOSIS — N179 Acute kidney failure, unspecified: Secondary | ICD-10-CM | POA: Diagnosis present

## 2015-09-25 DIAGNOSIS — R748 Abnormal levels of other serum enzymes: Secondary | ICD-10-CM | POA: Diagnosis not present

## 2015-09-25 DIAGNOSIS — Z79899 Other long term (current) drug therapy: Secondary | ICD-10-CM | POA: Diagnosis not present

## 2015-09-25 DIAGNOSIS — Z8249 Family history of ischemic heart disease and other diseases of the circulatory system: Secondary | ICD-10-CM | POA: Diagnosis not present

## 2015-09-25 DIAGNOSIS — N289 Disorder of kidney and ureter, unspecified: Secondary | ICD-10-CM | POA: Diagnosis not present

## 2015-09-25 DIAGNOSIS — Z90710 Acquired absence of both cervix and uterus: Secondary | ICD-10-CM | POA: Diagnosis not present

## 2015-09-25 DIAGNOSIS — Z9114 Patient's other noncompliance with medication regimen: Secondary | ICD-10-CM | POA: Diagnosis not present

## 2015-09-25 DIAGNOSIS — H409 Unspecified glaucoma: Secondary | ICD-10-CM | POA: Diagnosis present

## 2015-09-25 DIAGNOSIS — F329 Major depressive disorder, single episode, unspecified: Secondary | ICD-10-CM | POA: Diagnosis present

## 2015-09-25 DIAGNOSIS — R824 Acetonuria: Secondary | ICD-10-CM | POA: Diagnosis present

## 2015-09-25 DIAGNOSIS — Z66 Do not resuscitate: Secondary | ICD-10-CM | POA: Diagnosis present

## 2015-09-25 DIAGNOSIS — I10 Essential (primary) hypertension: Secondary | ICD-10-CM | POA: Diagnosis not present

## 2015-09-25 DIAGNOSIS — E872 Acidosis: Secondary | ICD-10-CM | POA: Diagnosis not present

## 2015-09-25 DIAGNOSIS — E1122 Type 2 diabetes mellitus with diabetic chronic kidney disease: Secondary | ICD-10-CM | POA: Diagnosis present

## 2015-09-25 HISTORY — DX: Gastro-esophageal reflux disease without esophagitis: K21.9

## 2015-09-25 LAB — URINALYSIS COMPLETE WITH MICROSCOPIC (ARMC ONLY)
BACTERIA UA: NONE SEEN
Bilirubin Urine: NEGATIVE
Nitrite: NEGATIVE
PROTEIN: NEGATIVE mg/dL
Specific Gravity, Urine: 1.016 (ref 1.005–1.030)
pH: 5 (ref 5.0–8.0)

## 2015-09-25 LAB — BASIC METABOLIC PANEL
ANION GAP: 26 — AB (ref 5–15)
Anion gap: 22 — ABNORMAL HIGH (ref 5–15)
BUN: 41 mg/dL — ABNORMAL HIGH (ref 6–20)
BUN: 43 mg/dL — ABNORMAL HIGH (ref 6–20)
CALCIUM: 9.8 mg/dL (ref 8.9–10.3)
CO2: 13 mmol/L — ABNORMAL LOW (ref 22–32)
CO2: 11 mmol/L — ABNORMAL LOW (ref 22–32)
Calcium: 8.8 mg/dL — ABNORMAL LOW (ref 8.9–10.3)
Chloride: 94 mmol/L — ABNORMAL LOW (ref 101–111)
Chloride: 105 mmol/L (ref 101–111)
Creatinine, Ser: 1.69 mg/dL — ABNORMAL HIGH (ref 0.44–1.00)
Creatinine, Ser: 1.47 mg/dL — ABNORMAL HIGH (ref 0.44–1.00)
GFR calc Af Amer: 39 mL/min — ABNORMAL LOW (ref >60)
GFR calc non Af Amer: 33 mL/min — ABNORMAL LOW (ref >60)
GFR, EST AFRICAN AMERICAN: 33 mL/min — AB (ref >60)
GFR, EST NON AFRICAN AMERICAN: 28 mL/min — AB (ref >60)
GLUCOSE: 715 mg/dL — AB (ref 65–99)
Glucose, Bld: 566 mg/dL (ref 65–99)
POTASSIUM: 5.3 mmol/L — AB (ref 3.5–5.1)
Potassium: 4.1 mmol/L (ref 3.5–5.1)
Sodium: 133 mmol/L — ABNORMAL LOW (ref 135–145)
Sodium: 138 mmol/L (ref 135–145)

## 2015-09-25 LAB — GLUCOSE, CAPILLARY
Glucose-Capillary: 486 mg/dL — ABNORMAL HIGH (ref 65–99)
Glucose-Capillary: 553 mg/dL (ref 65–99)
Glucose-Capillary: 562 mg/dL (ref 65–99)

## 2015-09-25 LAB — CBC
HEMATOCRIT: 43.3 % (ref 35.0–47.0)
HEMOGLOBIN: 13.8 g/dL (ref 12.0–16.0)
MCH: 29.7 pg (ref 26.0–34.0)
MCHC: 31.9 g/dL — ABNORMAL LOW (ref 32.0–36.0)
MCV: 93.1 fL (ref 80.0–100.0)
PLATELETS: 296 10*3/uL (ref 150–440)
RBC: 4.65 MIL/uL (ref 3.80–5.20)
RDW: 13.5 % (ref 11.5–14.5)
WBC: 16 10*3/uL — AB (ref 3.6–11.0)

## 2015-09-25 LAB — MRSA PCR SCREENING: MRSA by PCR: NEGATIVE

## 2015-09-25 LAB — TROPONIN I
Troponin I: <0.03 ng/mL (ref <0.031)
Troponin I: 0.05 ng/mL — ABNORMAL HIGH (ref <0.031)

## 2015-09-25 MED ORDER — SODIUM CHLORIDE 0.9 % IV SOLN
INTRAVENOUS | Status: DC
  Administered 2015-09-25: 8.3 [IU]/h via INTRAVENOUS
  Administered 2015-09-25: 4.9 [IU]/h via INTRAVENOUS
  Administered 2015-09-25: 5.4 [IU]/h via INTRAVENOUS
  Administered 2015-09-26: 10.1 [IU]/h via INTRAVENOUS
  Administered 2015-09-26: 9.5 [IU]/h via INTRAVENOUS
  Administered 2015-09-26: 6.8 [IU]/h via INTRAVENOUS
  Filled 2015-09-25: qty 2.5

## 2015-09-25 MED ORDER — SODIUM CHLORIDE 0.9 % IV SOLN
1000.0000 mL | Freq: Once | INTRAVENOUS | Status: AC
  Administered 2015-09-25: 1000 mL via INTRAVENOUS

## 2015-09-25 MED ORDER — QUINAPRIL HCL 10 MG PO TABS: 20.0000 mg | ORAL_TABLET | Freq: Every day | ORAL | Status: DC

## 2015-09-25 MED ORDER — TIMOLOL MALEATE 0.5 % OP SOLN
1.0000 [drp] | Freq: Every day | OPHTHALMIC | Status: DC
  Administered 2015-09-25 – 2015-09-28 (×4): 1 [drp] via OPHTHALMIC
  Filled 2015-09-25: qty 5

## 2015-09-25 MED ORDER — LABETALOL HCL 100 MG PO TABS
100.0000 mg | ORAL_TABLET | Freq: Two times a day (BID) | ORAL | Status: DC
  Administered 2015-09-25 – 2015-09-28 (×6): 100 mg via ORAL
  Filled 2015-09-25 (×7): qty 1

## 2015-09-25 MED ORDER — POTASSIUM CHLORIDE CRYS ER 10 MEQ PO TBCR
10.0000 meq | EXTENDED_RELEASE_TABLET | Freq: Every day | ORAL | Status: DC
  Administered 2015-09-26 – 2015-09-28 (×3): 10 meq via ORAL
  Filled 2015-09-25 (×3): qty 1

## 2015-09-25 MED ORDER — HEPARIN SODIUM (PORCINE) 5000 UNIT/ML IJ SOLN
5000.0000 [IU] | Freq: Three times a day (TID) | INTRAMUSCULAR | Status: DC
  Administered 2015-09-25: 5000 [IU] via SUBCUTANEOUS
  Filled 2015-09-25: qty 1

## 2015-09-25 MED ORDER — SODIUM CHLORIDE 0.9 % IV SOLN: INTRAVENOUS | Status: DC

## 2015-09-25 MED ORDER — HYDROCHLOROTHIAZIDE 12.5 MG PO CAPS
12.5000 mg | ORAL_CAPSULE | Freq: Every day | ORAL | Status: DC
Start: 2015-09-25 — End: 2015-09-28
  Administered 2015-09-25 – 2015-09-28 (×4): 12.5 mg via ORAL
  Filled 2015-09-25 (×4): qty 1

## 2015-09-25 MED ORDER — SODIUM CHLORIDE 0.9 % IV SOLN: 1000.0000 mL | INTRAVENOUS | Status: DC

## 2015-09-25 MED ORDER — SODIUM CHLORIDE 0.9 % IV SOLN: INTRAVENOUS | Status: AC

## 2015-09-25 MED ORDER — DEXTROSE-NACL 5-0.45 % IV SOLN
INTRAVENOUS | Status: DC
  Administered 2015-09-26 (×2): via INTRAVENOUS

## 2015-09-25 MED ORDER — MAGNESIUM OXIDE 400 (241.3 MG) MG PO TABS
400.0000 mg | ORAL_TABLET | Freq: Every day | ORAL | Status: DC
Start: 2015-09-25 — End: 2015-09-28
  Administered 2015-09-25 – 2015-09-28 (×4): 400 mg via ORAL
  Filled 2015-09-25 (×4): qty 1

## 2015-09-25 MED ORDER — SODIUM CHLORIDE 0.9 % IV SOLN
INTRAVENOUS | Status: DC
  Administered 2015-09-25: 22:00:00 via INTRAVENOUS

## 2015-09-25 MED ORDER — LISINOPRIL 20 MG PO TABS
20.0000 mg | ORAL_TABLET | Freq: Every day | ORAL | Status: DC
  Administered 2015-09-26 – 2015-09-28 (×3): 20 mg via ORAL
  Filled 2015-09-25 (×3): qty 1

## 2015-09-25 MED ORDER — PRAVASTATIN SODIUM 20 MG PO TABS
20.0000 mg | ORAL_TABLET | Freq: Every day | ORAL | Status: DC
  Administered 2015-09-26 – 2015-09-27 (×2): 20 mg via ORAL
  Filled 2015-09-25 (×2): qty 1

## 2015-09-25 MED ORDER — BRIMONIDINE TARTRATE 0.2 % OP SOLN
1.0000 [drp] | Freq: Every day | OPHTHALMIC | Status: DC
  Administered 2015-09-25 – 2015-09-28 (×4): 1 [drp] via OPHTHALMIC
  Filled 2015-09-25 (×2): qty 5

## 2015-09-25 MED ORDER — CITALOPRAM HYDROBROMIDE 20 MG PO TABS
20.0000 mg | ORAL_TABLET | Freq: Every day | ORAL | Status: DC
  Administered 2015-09-25 – 2015-09-28 (×4): 20 mg via ORAL
  Filled 2015-09-25 (×4): qty 1

## 2015-09-25 MED ORDER — LORATADINE 10 MG PO TABS
10.0000 mg | ORAL_TABLET | Freq: Every day | ORAL | Status: DC
  Administered 2015-09-25 – 2015-09-28 (×4): 10 mg via ORAL
  Filled 2015-09-25 (×4): qty 1

## 2015-09-25 MED ORDER — BRIMONIDINE TARTRATE-TIMOLOL 0.2-0.5 % OP SOLN
1.0000 [drp] | Freq: Every day | OPHTHALMIC | Status: DC
  Administered 2015-09-25: 1 [drp] via OPHTHALMIC

## 2015-09-25 NOTE — H&P (Signed)
Vicki [name] is an 75 y.o. female.    Chief Complaint: Chest pain HPI: The patient presents emergency department complaining of chest pain. She states the pain began today and is vague. She points to the entirety of her chest and then sometimes localizes the pain to her mid epigastric region. She denies associated shortness of breath, lightheadedness or diaphoresis. The patient admits to nausea and states that she vomited a few times yesterday. Currently she states that she feels "dry as a bone". Laboratory evaluation the emergency department revealed per glycemia, ketonuria and increased anion gap consistent with diabetic ketoacidosis prompted the emergency department staff to call for admission.  Past Medical History  Diagnosis Date  . Diabetes mellitus without complication ([place])   . Hypertension   . GERD (gastroesophageal reflux disease)     Past Surgical History  Procedure Laterality Date  . Abdominal hysterectomy      Family History  Problem Relation Age of Onset  . Diabetes Mellitus II Mother   . CAD Father   . Hypertension Sister    Social History:  reports that she has never smoked. She does not have any smokeless tobacco history on file. She reports that she does not drink alcohol or use illicit drugs.  Allergies: No Known Allergies  Medications Prior to Admission  Medication Sig Dispense Refill  . acetaminophen (TYLENOL) 500 MG tablet Take 1,000-1,500 mg by mouth every 6 (six) hours as needed for mild pain or headache.    . brimonidine-timolol (COMBIGAN) 0.2-0.5 % ophthalmic solution Place 1 drop into both eyes daily.    . citalopram (CELEXA) 20 MG tablet Take 20 mg by mouth daily.    . hydrochlorothiazide (MICROZIDE) 12.5 MG capsule Take 12.5 mg by mouth daily.    . Insulin Glargine (TOUJEO SOLOSTAR) 300 UNIT/ML SOPN Inject 32 Units into the skin at bedtime.    . insulin lispro (HUMALOG) 100 UNIT/ML injection Inject 2-10 Units into the skin 3 (three) times daily before  meals. Pt uses per sliding scale.    . labetalol (NORMODYNE) 100 MG tablet Take 100 mg by mouth 2 (two) times daily.    Marland Kitchen loratadine (CLARITIN) 10 MG tablet Take 10 mg by mouth daily.    Marland Kitchen lovastatin (MEVACOR) 20 MG tablet Take 20 mg by mouth at bedtime.    . magnesium oxide (MAG-OX) 400 MG tablet Take 400 mg by mouth daily.    . potassium chloride (K-DUR,KLOR-CON) 10 MEQ tablet Take 10 mEq by mouth daily.    . quinapril (ACCUPRIL) 20 MG tablet Take 20 mg by mouth daily.    Marland Kitchen lidocaine (LIDODERM) 5 % Place 1 patch onto the skin every 12 (twelve) hours. Remove & Discard patch within 12 hours or as directed by MD (Patient not taking: Reported on 09/25/2015) 10 patch 0  . polyethylene glycol (MIRALAX / GLYCOLAX) packet Take 17 g by mouth daily. (Patient not taking: Reported on 09/25/2015) 14 each 0    Results for orders placed or performed during the hospital encounter of 09/25/15 (from the past 48 hour(s))  Basic metabolic panel     Status: Abnormal   Collection Time: 09/25/15  4:49 PM  Result Value Ref Range   Sodium 133 (L) 135 - 145 mmol/L    Comment: LYTES REPEATED   Potassium 5.3 (H) 3.5 - 5.1 mmol/L   Chloride 94 (L) 101 - 111 mmol/L   CO2 13 (L) 22 - 32 mmol/L   Glucose, Bld 715 (HH) 65 - 99 mg/dL  Comment: CRITICAL RESULT CALLED TO, READ BACK BY AND VERIFIED WITH  JANE RYAN AT 1755 09/25/15 SDR    BUN 41 (H) 6 - 20 mg/dL   Creatinine, Ser 1.69 (H) 0.44 - 1.00 mg/dL   Calcium 9.8 8.9 - 10.3 mg/dL   GFR calc non Af Amer 28 (L) >60 mL/min   GFR calc Af Amer 33 (L) >60 mL/min    Comment: (NOTE) The eGFR has been calculated using the CKD EPI equation. This calculation has not been validated in all clinical situations. eGFR's persistently <60 mL/min signify possible Chronic Kidney Disease.    Anion gap 26 (H) 5 - 15  CBC     Status: Abnormal   Collection Time: 09/25/15  4:49 PM  Result Value Ref Range   WBC 16.0 (H) 3.6 - 11.0 K/uL   RBC 4.65 3.80 - 5.20 MIL/uL   Hemoglobin  13.8 12.0 - 16.0 g/dL   HCT 43.3 35.0 - 47.0 %   MCV 93.1 80.0 - 100.0 fL   MCH 29.7 26.0 - 34.0 pg   MCHC 31.9 (L) 32.0 - 36.0 g/dL   RDW 13.5 11.5 - 14.5 %   Platelets 296 150 - 440 K/uL  Troponin I     Status: None   Collection Time: 09/25/15  4:49 PM  Result Value Ref Range   Troponin I <0.03 <0.031 ng/mL    Comment:        NO INDICATION OF MYOCARDIAL INJURY.   Glucose, capillary     Status: Abnormal   Collection Time: 09/25/15  7:28 PM  Result Value Ref Range   Glucose-Capillary >600 (HH) 65 - 99 mg/dL  Glucose, capillary     Status: Abnormal   Collection Time: 09/25/15  8:31 PM  Result Value Ref Range   Glucose-Capillary 486 (H) 65 - 99 mg/dL  Urinalysis complete, with microscopic ([facility] only)     Status: Abnormal   Collection Time: 09/25/15  9:19 PM  Result Value Ref Range   Color, Urine STRAW (A) YELLOW   APPearance HAZY (A) CLEAR   Glucose, UA >500 (A) NEGATIVE mg/dL   Bilirubin Urine NEGATIVE NEGATIVE   Ketones, ur 2+ (A) NEGATIVE mg/dL   Specific Gravity, Urine 1.016 1.005 - 1.030   Hgb urine dipstick 1+ (A) NEGATIVE   pH 5.0 5.0 - 8.0   Protein, ur NEGATIVE NEGATIVE mg/dL   Nitrite NEGATIVE NEGATIVE   Leukocytes, UA TRACE (A) NEGATIVE   RBC / HPF 0-5 0 - 5 RBC/hpf   WBC, UA 6-30 0 - 5 WBC/hpf   Bacteria, UA NONE SEEN NONE SEEN   Squamous Epithelial / LPF 0-5 (A) NONE SEEN   Mucous PRESENT   Glucose, capillary     Status: Abnormal   Collection Time: 09/25/15  9:37 PM  Result Value Ref Range   Glucose-Capillary 553 (HH) 65 - 99 mg/dL  MRSA PCR Screening     Status: None   Collection Time: 09/25/15  9:44 PM  Result Value Ref Range   MRSA by PCR NEGATIVE NEGATIVE    Comment:        The GeneXpert MRSA Assay (FDA approved for NASAL specimens only), is one component of a comprehensive MRSA colonization surveillance program. It is not intended to diagnose MRSA infection nor to guide or monitor treatment for MRSA infections.   Basic metabolic panel      Status: Abnormal   Collection Time: 09/25/15  9:49 PM  Result Value Ref Range   Sodium 138 135 -  145 mmol/L   Potassium 4.1 3.5 - 5.1 mmol/L   Chloride 105 101 - 111 mmol/L   CO2 11 (L) 22 - 32 mmol/L   Glucose, Bld 566 (HH) 65 - 99 mg/dL    Comment: CRITICAL RESULT CALLED TO, READ BACK BY AND VERIFIED WITH HIRAL PATEL ON 09/25/15 AT [address] [facility]    BUN 43 (H) 6 - 20 mg/dL   Creatinine, Ser 1.47 (H) 0.44 - 1.00 mg/dL   Calcium 8.8 (L) 8.9 - 10.3 mg/dL   GFR calc non Af Amer 33 (L) >60 mL/min   GFR calc Af Amer 39 (L) >60 mL/min    Comment: (NOTE) The eGFR has been calculated using the CKD EPI equation. This calculation has not been validated in all clinical situations. eGFR's persistently <60 mL/min signify possible Chronic Kidney Disease.    Anion gap 22 (H) 5 - 15  Troponin I     Status: Abnormal   Collection Time: 09/25/15  9:49 PM  Result Value Ref Range   Troponin I 0.05 (H) <0.031 ng/mL    Comment: READ BACK AND VERIFIED WITH HIRAL PATEL ON 09/25/15 AT [address] [place]        PERSISTENTLY INCREASED TROPONIN VALUES IN THE RANGE OF 0.04-0.49 ng/mL CAN BE SEEN IN:       -UNSTABLE ANGINA       -CONGESTIVE HEART FAILURE       -MYOCARDITIS       -CHEST TRAUMA       -ARRYHTHMIAS       -LATE PRESENTING MYOCARDIAL INFARCTION       -COPD   CLINICAL FOLLOW-UP RECOMMENDED.   Glucose, capillary     Status: Abnormal   Collection Time: 09/25/15 10:25 PM  Result Value Ref Range   Glucose-Capillary 562 (HH) 65 - 99 mg/dL   Comment 1 Notify RN    Dg Chest 2 View  09/25/2015  CLINICAL DATA:  Chest pain beginning 5 days ago. Intermittent shortness of breath. Generalized weakness. EXAM: CHEST  2 VIEW COMPARISON:  06/10/2015 FINDINGS: The cardiomediastinal silhouette is within normal limits. The lungs are well inflated and clear. No pleural effusion or pneumothorax is seen. Right upper quadrant abdominal surgical clips are noted. No acute osseous abnormality is identified. IMPRESSION: No active  cardiopulmonary disease. Electronically Signed   By: Logan Bores M.D.   On: 09/25/2015 17:13    Review of Systems  Constitutional: Negative for fever and chills.  HENT: Negative for sore throat and tinnitus.   Eyes: Negative for blurred vision and redness.  Respiratory: Negative for cough and shortness of breath.   Cardiovascular: Positive for chest pain (ongoing). Negative for palpitations, orthopnea and PND.  Gastrointestinal: Negative for nausea, vomiting, abdominal pain and diarrhea.  Genitourinary: Negative for dysuria, urgency and frequency.  Musculoskeletal: Negative for myalgias and joint pain.  Skin: Negative for rash.       No lesions  Neurological: Negative for speech change, focal weakness and weakness.  Endo/Heme/Allergies: Does not bruise/bleed easily.       No temperature intolerance  Psychiatric/Behavioral: Negative for depression and suicidal ideas.    Blood pressure 153/58, pulse 80, temperature 98.7 F (37.1 C), temperature source Oral, resp. rate 18, height '5\' 3"'$  (1.6 m), weight 65.2 kg (143 lb 11.8 oz), SpO2 99 %. Physical Exam  Vitals reviewed. Constitutional: She is oriented to person, place, and time. She appears well-developed and well-nourished. No distress.  HENT:  Head: Normocephalic and atraumatic.  Mouth/Throat: Oropharynx is clear and moist.  Eyes: Conjunctivae and EOM are normal. Pupils are equal, round, and reactive to light. No scleral icterus.  Neck: Normal range of motion. Neck supple. No JVD present. No tracheal deviation present. No thyromegaly present.  Cardiovascular: Normal rate, regular rhythm and normal heart sounds.  Exam reveals no gallop and no friction rub.   No murmur heard. Respiratory: Effort normal and breath sounds normal.  GI: Soft. Bowel sounds are normal. She exhibits no distension. There is no tenderness.  Genitourinary:  Deferred  Musculoskeletal: Normal range of motion. She exhibits no edema.  Lymphadenopathy:    She has  no cervical adenopathy.  Neurological: She is alert and oriented to person, place, and time. No cranial nerve deficit. She exhibits normal muscle tone.  Skin: Skin is warm and dry.  Psychiatric: She has a normal mood and affect. Her behavior is normal. Judgment and thought content normal.     Assessment/Plan This is a 75 year old African American female admitted for diabetic ketoacidosis. 1. DKA: We have started an insulin drip and will admit the patient to the ICU. We'll aggressively hydrate. Follow anion gap and initiate long-acting insulin once it is closed. We will then allow the patient to eat by mouth. Check HbA1c. 2. Chest pain: atypical; likely non-cardiac.  Trend cardiac enzymes.  Monitor telemetry. 3. Essential hypertension: continue lisinopril, HCTZ, and labetalol 4. Chronic kidney disease: stage III; avoid nephrotoxic agents and manage blood sugar and blood pressure 5. Depression: continue Celexa 6. Glaucoma: continue timolol and brimonidine eye drops 7. DVT prophylaxis: heparin 8. GI prophylaxis: pantoprazole as the patient is critically ill and may have GERD causing chest pain. The patient is a DO NOT RESUSCITATE.  Time spent on admission orders and critical patient care approximately 45 minutes   Harrie Foreman, MD 09/25/2015, 11:55 PM

## 2015-09-25 NOTE — Progress Notes (Signed)
eLink Physician-Brief Progress Note Patient Name: Vicki [name] DOB: 30-Mar-1941 MRN: [id number]   Date of Service  09/25/2015  HPI/Events of Note  75 yo female with PMH of DM, HTN and GERD. Presents with blood glucose > 600 and 2+ ketones in urine. Diagnosis >> DKA. Current medical regimen includes Celexa, Heparin [place], Labetalol, Lisinopril, Claritin, Magnesium, KDur, Pravachol, Timotic, Alphagan and an insulin IV infusion. Temp = 98.7 F, BP = 140/62, HR = 96, sat = 99% and RR = 20 on room air. Management per Hospitalist Service.   eICU Interventions  Continue present management.     Intervention Category Evaluation Type: New Patient Evaluation  Lenell Antu 09/25/2015, 10:04 PM

## 2015-09-25 NOTE — Plan of Care (Signed)
Problem: Education: Goal: Knowledge of [place] [facility] Education information/materials will improve Outcome: Completed/Met Date Met:  09/25/15 Admission booklet given  Problem: Safety: Goal: Ability to remain free from injury will improve Outcome: Completed/Met Date Met:  09/25/15 Call bell at bedside; instructed to call before getting out of bed

## 2015-09-25 NOTE — ED Notes (Signed)
Pt reports feeling as though her heart is racing. MD made aware.

## 2015-09-25 NOTE — ED Notes (Signed)
Also c/o vomiting and diarrhea for 5 days.

## 2015-09-25 NOTE — ED Notes (Signed)
C/o chest pain.  Onset of symptoms 5 days ago.  Also c/o intermittent SOB.

## 2015-09-25 NOTE — ED Notes (Signed)
Blood sugar 715, DR.Huel Cote

## 2015-09-25 NOTE — ED Provider Notes (Signed)
Time Seen: Approximately ----------------------------------------- 6:53 PM on 09/25/2015 -----------------------------------------    I have reviewed the triage notes  Chief Complaint: Chest Pain and Emesis   History of Present Illness: Vicki [name] is a 75 y.o. female who states that she started feeling ill last Wednesday. Patient extremely poor historian but alert I can ascertain she started having some nausea vomiting and loose stool and some upper chest and nasal congestion. And states that she was having a hard time regulating her insulin at home and the last time that she took her insulin was Thursday. The patient denies any dysuria, hematuria or urinary frequency. She denies any chest pain currently but stated that she did notice some this morning and she thought it was associated with her vomiting. She denies any arm or jaw pain, shortness of breath or productive cough. She has been urinating frequently. She denies any bloody diarrhea.   Past Medical History  Diagnosis Date  . Diabetes mellitus without complication ([facility])   . Hypertension   . GERD (gastroesophageal reflux disease)     There are no active problems to display for this patient.   History reviewed. No pertinent past surgical history.  History reviewed. No pertinent past surgical history.  Current Outpatient Rx  Name  Route  Sig  Dispense  Refill  . lidocaine (LIDODERM) 5 %   Transdermal   Place 1 patch onto the skin every 12 (twelve) hours. Remove & Discard patch within 12 hours or as directed by MD   10 patch   0   . polyethylene glycol (MIRALAX / GLYCOLAX) packet   Oral   Take 17 g by mouth daily.   14 each   0     Allergies:  Review of patient's allergies indicates no known allergies.  Family History: No family history on file.  Social History: Social History  Substance Use Topics  . Smoking status: Never Smoker   . Smokeless tobacco: None  . Alcohol Use: No     Review of  Systems:   10 point review of systems was performed and was otherwise negative:  Constitutional: No fever Eyes: No visual disturbances ENT: No sore throat, ear pain she does have dry mucous membranes Cardiac: No chest pain Respiratory: No shortness of breath, wheezing, or stridor Abdomen: Patient points mainly to the right suprapubic area as the source of discomfort. She denies any upper abdominal pain. Endocrine: No weight loss, No night sweats Extremities: No peripheral edema, cyanosis Skin: No rashes, easy bruising Neurologic: No focal weakness, trouble with speech or swollowing Urologic: No dysuria, Hematuria she has had urinary frequency   Physical Exam:  ED Triage Vitals  Enc Vitals Group     BP 09/25/15 1644 171/67 mmHg     Pulse Rate 09/25/15 1644 83     Resp 09/25/15 1644 18     Temp 09/25/15 1644 97.5 F (36.4 C)     Temp Source 09/25/15 1644 Oral     SpO2 09/25/15 1644 100 %     Weight 09/25/15 1644 140 lb (63.504 kg)     Height 09/25/15 1644  (1.651 m)     Head Cir --      Peak Flow --      Pain Score 09/25/15 1646 10     Pain Loc --      Pain Edu? --      Excl. in GC? --     General: Awake , Alert , and Oriented times 3;  GCS 15 Head: Normal cephalic , atraumatic Eyes: Pupils equal , round, reactive to light Nose/Throat: No nasal drainage, patent upper airway without erythema or exudate. Patient has dry mucous membranes with ketotic breath  Neck: Supple, Full range of motion, No anterior adenopathy or palpable thyroid masses Lungs: Clear to ascultation without wheezes , rhonchi, or rales Heart: Regular rate, regular rhythm without murmurs , gallops , or rubs Abdomen: Mild tenderness over the right suprapubic area without any rebound, guarding, or rigidity. No palpable masses.      Extremities: 2 plus symmetric pulses. No edema, clubbing or cyanosis Neurologic: normal ambulation, Motor symmetric without deficits, sensory intact Skin: warm, dry, no  rashes   Labs:   All laboratory work was reviewed including any pertinent negatives or positives listed below:  Labs Reviewed  BASIC METABOLIC PANEL - Abnormal; Notable for the following:    Sodium 133 (*)    Potassium 5.3 (*)    Chloride 94 (*)    CO2 13 (*)    Glucose, Bld 715 (*)    BUN 41 (*)    Creatinine, Ser 1.69 (*)    GFR calc non Af Amer 28 (*)    GFR calc Af Amer 33 (*)    Anion gap 26 (*)    All other components within normal limits  CBC - Abnormal; Notable for the following:    WBC 16.0 (*)    MCHC 31.9 (*)    All other components within normal limits  TROPONIN I  URINALYSIS COMPLETEWITH MICROSCOPIC ([facility] ONLY)   patient appears to be acidotic with renal insufficiency  Urinalysis is pending EKG:  ED ECG REPORT I, Jennye Moccasin, the attending physician, personally viewed and interpreted this ECG.  Date: 09/25/2015 EKG Time: *1642 Rate: *85 Rhythm: normal sinus rhythm QRS Axis: normal Intervals: normal ST/T Wave abnormalities: Peak T waves subtle ST depression noticed in all leads Conduction Disturbances: none Narrative Interpretation: unremarkable No ST elevation   Radiology:   shortness of breath. Generalized weakness.  EXAM: CHEST 2 VIEW  COMPARISON: 06/10/2015  FINDINGS: The cardiomediastinal silhouette is within normal limits. The lungs are well inflated and clear. No pleural effusion or pneumothorax is seen. Right upper quadrant abdominal surgical clips are noted. No acute osseous abnormality is identified.  IMPRESSION: No active cardiopulmonary disease.   Electronically Signed   I personally reviewed the radiologic studies    Critical Care: * CRITICAL CARE Performed by: Jennye Moccasin   Total critical care time: 36 minutes minutes  Critical care time was exclusive of separately billable procedures and treating other patients.  Critical care was necessary to treat or prevent imminent or life-threatening  deterioration.  Critical care was time spent personally by me on the following activities: development of treatment plan with patient and/or surrogate as well as nursing, discussions with consultants, evaluation of patient's response to treatment, examination of patient, obtaining history from patient or surrogate, ordering and performing treatments and interventions, ordering and review of laboratory studies, ordering and review of radiographic studies, pulse oximetry and re-evaluation of patient's condition. Initial evaluation, treatment for diabetic ketoacidosis    ED Course: * Patient was acutely started on 2 IVs and was initiated on 2 L bolus with initiation of insulin drip. Patient's bicarbonate is low at 13 we are still awaiting urine but I suspected she may have ketosis. The patient is not actively vomiting at this time require further investigation of her chest pain along with her lower abdominal pain. I felt  we needed to address her diabetic ketoacidosis initially.   Assessment: * Acute diabetic ketoacidosis Renal insufficiency     Plan: * Inpatient management*            Jennye Moccasin, MD 09/25/15 1900

## 2015-09-26 ENCOUNTER — Encounter: Payer: Self-pay | Admitting: Internal Medicine

## 2015-09-26 DIAGNOSIS — R778 Other specified abnormalities of plasma proteins: Secondary | ICD-10-CM

## 2015-09-26 DIAGNOSIS — R7989 Other specified abnormal findings of blood chemistry: Secondary | ICD-10-CM

## 2015-09-26 LAB — BASIC METABOLIC PANEL
Anion gap: 11 (ref 5–15)
Anion gap: 5 (ref 5–15)
Anion gap: 3 — ABNORMAL LOW (ref 5–15)
Anion gap: 7 (ref 5–15)
BUN: 45 mg/dL — ABNORMAL HIGH (ref 6–20)
BUN: 48 mg/dL — ABNORMAL HIGH (ref 6–20)
BUN: 44 mg/dL — ABNORMAL HIGH (ref 6–20)
BUN: 43 mg/dL — ABNORMAL HIGH (ref 6–20)
CO2: 18 mmol/L — ABNORMAL LOW (ref 22–32)
CO2: 26 mmol/L (ref 22–32)
CO2: 27 mmol/L (ref 22–32)
CO2: 24 mmol/L (ref 22–32)
Calcium: 8.3 mg/dL — ABNORMAL LOW (ref 8.9–10.3)
Calcium: 8.7 mg/dL — ABNORMAL LOW (ref 8.9–10.3)
Calcium: 8.8 mg/dL — ABNORMAL LOW (ref 8.9–10.3)
Calcium: 8 mg/dL — ABNORMAL LOW (ref 8.9–10.3)
Chloride: 110 mmol/L (ref 101–111)
Chloride: 112 mmol/L — ABNORMAL HIGH (ref 101–111)
Chloride: 112 mmol/L — ABNORMAL HIGH (ref 101–111)
Chloride: 104 mmol/L (ref 101–111)
Creatinine, Ser: 1.36 mg/dL — ABNORMAL HIGH (ref 0.44–1.00)
Creatinine, Ser: 1.27 mg/dL — ABNORMAL HIGH (ref 0.44–1.00)
Creatinine, Ser: 1.01 mg/dL — ABNORMAL HIGH (ref 0.44–1.00)
Creatinine, Ser: 1.08 mg/dL — ABNORMAL HIGH (ref 0.44–1.00)
GFR calc Af Amer: 43 mL/min — ABNORMAL LOW (ref >60)
GFR calc Af Amer: 46 mL/min — ABNORMAL LOW (ref >60)
GFR calc Af Amer: >60 mL/min (ref >60)
GFR calc Af Amer: 56 mL/min — ABNORMAL LOW (ref >60)
GFR calc non Af Amer: 37 mL/min — ABNORMAL LOW (ref >60)
GFR calc non Af Amer: 40 mL/min — ABNORMAL LOW (ref >60)
GFR calc non Af Amer: 53 mL/min — ABNORMAL LOW (ref >60)
GFR calc non Af Amer: 49 mL/min — ABNORMAL LOW (ref >60)
Glucose, Bld: 315 mg/dL — ABNORMAL HIGH (ref 65–99)
Glucose, Bld: 164 mg/dL — ABNORMAL HIGH (ref 65–99)
Glucose, Bld: 103 mg/dL — ABNORMAL HIGH (ref 65–99)
Glucose, Bld: 308 mg/dL — ABNORMAL HIGH (ref 65–99)
Potassium: 3.5 mmol/L (ref 3.5–5.1)
Potassium: 3.4 mmol/L — ABNORMAL LOW (ref 3.5–5.1)
Potassium: 3.5 mmol/L (ref 3.5–5.1)
Potassium: 3.4 mmol/L — ABNORMAL LOW (ref 3.5–5.1)
Sodium: 139 mmol/L (ref 135–145)
Sodium: 143 mmol/L (ref 135–145)
Sodium: 142 mmol/L (ref 135–145)
Sodium: 135 mmol/L (ref 135–145)

## 2015-09-26 LAB — GLUCOSE, CAPILLARY
Glucose-Capillary: 115 mg/dL — ABNORMAL HIGH (ref 65–99)
Glucose-Capillary: 125 mg/dL — ABNORMAL HIGH (ref 65–99)
Glucose-Capillary: 128 mg/dL — ABNORMAL HIGH (ref 65–99)
Glucose-Capillary: 157 mg/dL — ABNORMAL HIGH (ref 65–99)
Glucose-Capillary: 204 mg/dL — ABNORMAL HIGH (ref 65–99)
Glucose-Capillary: 219 mg/dL — ABNORMAL HIGH (ref 65–99)
Glucose-Capillary: 248 mg/dL — ABNORMAL HIGH (ref 65–99)
Glucose-Capillary: 309 mg/dL — ABNORMAL HIGH (ref 65–99)
Glucose-Capillary: 323 mg/dL — ABNORMAL HIGH (ref 65–99)
Glucose-Capillary: 341 mg/dL — ABNORMAL HIGH (ref 65–99)
Glucose-Capillary: 347 mg/dL — ABNORMAL HIGH (ref 65–99)
Glucose-Capillary: 368 mg/dL — ABNORMAL HIGH (ref 65–99)
Glucose-Capillary: 411 mg/dL — ABNORMAL HIGH (ref 65–99)
Glucose-Capillary: 473 mg/dL — ABNORMAL HIGH (ref 65–99)
Glucose-Capillary: 87 mg/dL (ref 65–99)
Glucose-Capillary: 88 mg/dL (ref 65–99)

## 2015-09-26 LAB — TROPONIN I
Troponin I: 0.15 ng/mL — ABNORMAL HIGH (ref <0.031)
Troponin I: 0.2 ng/mL — ABNORMAL HIGH (ref <0.031)
Troponin I: 0.14 ng/mL — ABNORMAL HIGH (ref <0.031)

## 2015-09-26 LAB — APTT: aPTT: 24 seconds (ref 24–36)

## 2015-09-26 LAB — PROTIME-INR
INR: 0.94
PROTHROMBIN TIME: 12.8 s (ref 11.4–15.0)

## 2015-09-26 MED ORDER — INSULIN GLARGINE 100 UNIT/ML ~~LOC~~ SOLN
21.0000 [IU] | Freq: Every day | SUBCUTANEOUS | Status: DC
  Administered 2015-09-26: 21 [IU] via SUBCUTANEOUS
  Filled 2015-09-26 (×3): qty 0.21

## 2015-09-26 MED ORDER — CETYLPYRIDINIUM CHLORIDE 0.05 % MT LIQD
7.0000 mL | Freq: Two times a day (BID) | OROMUCOSAL | Status: DC
  Administered 2015-09-26 – 2015-09-27 (×3): 7 mL via OROMUCOSAL

## 2015-09-26 MED ORDER — INSULIN ASPART 100 UNIT/ML ~~LOC~~ SOLN
0.0000 [IU] | Freq: Three times a day (TID) | SUBCUTANEOUS | Status: DC
  Administered 2015-09-26: 3 [IU] via SUBCUTANEOUS
  Administered 2015-09-27: 7 [IU] via SUBCUTANEOUS
  Administered 2015-09-27: 10 [IU] via SUBCUTANEOUS
  Administered 2015-09-27: 12 [IU] via SUBCUTANEOUS
  Administered 2015-09-28 (×2): 7 [IU] via SUBCUTANEOUS
  Filled 2015-09-26: qty 4
  Filled 2015-09-26: qty 2
  Filled 2015-09-26 (×2): qty 7
  Filled 2015-09-26: qty 3
  Filled 2015-09-26: qty 8

## 2015-09-26 MED ORDER — ASPIRIN 81 MG PO CHEW
81.0000 mg | CHEWABLE_TABLET | Freq: Every day | ORAL | Status: DC
  Administered 2015-09-26 – 2015-09-28 (×3): 81 mg via ORAL
  Filled 2015-09-26 (×3): qty 1

## 2015-09-26 MED ORDER — HEPARIN BOLUS VIA INFUSION
3900.0000 [IU] | Freq: Once | INTRAVENOUS | Status: AC
  Administered 2015-09-26: 3900 [IU] via INTRAVENOUS
  Filled 2015-09-26: qty 3900

## 2015-09-26 MED ORDER — ENOXAPARIN SODIUM 40 MG/0.4ML ~~LOC~~ SOLN
40.0000 mg | SUBCUTANEOUS | Status: DC
  Administered 2015-09-26 – 2015-09-27 (×2): 40 mg via SUBCUTANEOUS
  Filled 2015-09-26 (×3): qty 0.4

## 2015-09-26 MED ORDER — HEPARIN (PORCINE) IN NACL 100-0.45 UNIT/ML-% IJ SOLN
800.0000 [IU]/h | INTRAMUSCULAR | Status: DC
  Administered 2015-09-26: 800 [IU]/h via INTRAVENOUS
  Filled 2015-09-26 (×2): qty 250

## 2015-09-26 MED ORDER — INSULIN ASPART 100 UNIT/ML ~~LOC~~ SOLN
2.0000 [IU] | Freq: Three times a day (TID) | SUBCUTANEOUS | Status: DC
  Administered 2015-09-26: 2 [IU] via SUBCUTANEOUS
  Filled 2015-09-26: qty 12

## 2015-09-26 MED ORDER — INSULIN ASPART 100 UNIT/ML ~~LOC~~ SOLN
0.0000 [IU] | Freq: Every day | SUBCUTANEOUS | Status: DC
  Administered 2015-09-26: 4 [IU] via SUBCUTANEOUS
  Administered 2015-09-27: 3 [IU] via SUBCUTANEOUS
  Filled 2015-09-26: qty 3

## 2015-09-26 NOTE — Progress Notes (Signed)
MD notified of patient's troponin level x2. No new orderes received.

## 2015-09-26 NOTE — Consult Note (Signed)
[facility] CLINIC CARDIOLOGY [facility]  CARDIOLOGY CONSULT NOTE  Patient ID: Vicki [name] MRN: [id number] DOB/AGE: 03/03/1941 75 y.o.  Admit date: 09/25/2015 Referring Physician Dr. Luberta Mutter Primary Physician   Primary Cardiologist Dr. Juliann Pares Reason for Consultation elevated troponin  HPI: Pt is a 75 yo female with history of dm, htn who was admitted after presenting to the er with complaints of chest pain which she states was all over her chest and sometimes in the epigastric region. She had no diapharesis, or sob but had nausea and vomiting. In the er she was noted to have evidence of dka with hyperglycemia, ketonuria and gap acidosis. She had a troponin drawn which was elevated at 0.2. EKG was unremarkable for ischemia. She was hydrated and placed on insulin drip. She was placed on heparin for the elevated troponin. SHe has had no further chest pain. Serum creatinine was elevated to 1.69 and serum glucose was 715.   Review of Systems  Constitutional: Positive for malaise/fatigue.  HENT: Negative.   Eyes: Positive for blurred vision.  Respiratory: Positive for shortness of breath.   Cardiovascular: Positive for chest pain.  Gastrointestinal: Positive for nausea and vomiting.  Genitourinary: Negative.   Musculoskeletal: Negative.   Neurological: Positive for weakness.  Endo/Heme/Allergies: Negative.   Psychiatric/Behavioral: Negative.   All other systems reviewed and are negative.   Past Medical History  Diagnosis Date  . Diabetes mellitus without complication (HCC)   . Hypertension   . GERD (gastroesophageal reflux disease)     Family History  Problem Relation Age of Onset  . Diabetes Mellitus II Mother   . CAD Father   . Hypertension Sister     Social History   Social History  . Marital Status: Widowed    Spouse Name: N/A  . Number of Children: N/A  . Years of Education: N/A   Occupational History  . Not on file.   Social History Main Topics  .  Smoking status: Never Smoker   . Smokeless tobacco: Not on file  . Alcohol Use: No  . Drug Use: No  . Sexual Activity: Not on file   Other Topics Concern  . Not on file   Social History Narrative    Past Surgical History  Procedure Laterality Date  . Abdominal hysterectomy       Prescriptions prior to admission  Medication Sig Dispense Refill Last Dose  . acetaminophen (TYLENOL) 500 MG tablet Take 1,000-1,500 mg by mouth every 6 (six) hours as needed for mild pain or headache.   09/24/2015 at [facility]   . brimonidine-timolol (COMBIGAN) 0.2-0.5 % ophthalmic solution Place 1 drop into both eyes daily.   09/24/2015 at Unknown time  . citalopram (CELEXA) 20 MG tablet Take 20 mg by mouth daily.   09/24/2015 at Unknown time  . hydrochlorothiazide (MICROZIDE) 12.5 MG capsule Take 12.5 mg by mouth daily.   09/24/2015 at Unknown time  . Insulin Glargine (TOUJEO SOLOSTAR) 300 UNIT/ML SOPN Inject 32 Units into the skin at bedtime.   09/24/2015 at Unknown time  . insulin lispro (HUMALOG) 100 UNIT/ML injection Inject 2-10 Units into the skin 3 (three) times daily before meals. Pt uses per sliding scale.   09/24/2015 at Unknown time  . labetalol (NORMODYNE) 100 MG tablet Take 100 mg by mouth 2 (two) times daily.   09/24/2015 at Unsure  . loratadine (CLARITIN) 10 MG tablet Take 10 mg by mouth daily.   09/24/2015 at Unknown time  . lovastatin (MEVACOR) 20 MG  tablet Take 20 mg by mouth at bedtime.   09/24/2015 at Unknown time  . magnesium oxide (MAG-OX) 400 MG tablet Take 400 mg by mouth daily.   09/24/2015 at Unknown time  . potassium chloride (K-DUR,KLOR-CON) 10 MEQ tablet Take 10 mEq by mouth daily.   09/24/2015 at Unknown time  . quinapril (ACCUPRIL) 20 MG tablet Take 20 mg by mouth daily.   09/24/2015 at Unknown time  . lidocaine (LIDODERM) 5 % Place 1 patch onto the skin every 12 (twelve) hours. Remove & Discard patch within 12 hours or as directed by MD (Patient not taking: Reported on 09/25/2015) 10 patch 0    . polyethylene glycol (MIRALAX / GLYCOLAX) packet Take 17 g by mouth daily. (Patient not taking: Reported on 09/25/2015) 14 each 0     Physical Exam: Blood pressure 152/65, pulse 73, temperature 98.9 F (37.2 C), temperature source Oral, resp. rate 15, height  (1.6 m), weight 65.2 kg (143 lb 11.8 oz), SpO2 94 %.   Wt Readings from Last 1 Encounters:  09/25/15 65.2 kg (143 lb 11.8 oz)     General appearance: alert and cooperative Head: Normocephalic, without obvious abnormality, atraumatic Resp: clear to auscultation bilaterally Cardio: regular rate and rhythm GI: normal findings: bowel sounds normal and abnormal findings:  mild tenderness in the epigastrium Extremities: extremities normal, atraumatic, no cyanosis or edema Pulses: 2+ and symmetric Neurologic: Grossly normal  Labs:   Lab Results  Component Value Date   WBC 16.0* 09/25/2015   HGB 13.8 09/25/2015   HCT 43.3 09/25/2015   MCV 93.1 09/25/2015   PLT 296 09/25/2015    Recent Labs Lab 09/26/15 0936  NA 142  K 3.5  CL 112*  CO2 27  BUN 44*  CREATININE 1.01*  CALCIUM 8.8*  GLUCOSE 103*   Lab Results  Component Value Date   CKTOTAL 562* 06/10/2015   CKMB 2.8 12/30/2011   TROPONINI 0.20* 09/26/2015      Radiology: No active cardiopulmonary disease EKG: NSR with no ischemia  ASSESSMENT AND PLAN:  Pt with history of hypertension and dm admitted with chest pain and noted to be in dka. Improved with hydration and insulin. Mild troponin elevation which appears to be secondary to dka . No evidence of nstemi at present. WIll discontinue heparin. Continue with iv insulin and hydration. Continue with labetolol and pravastatin. Further recs pedning course. Would not proceed with cardiac cath at present.  Signed: Dalia Heading MD, [facility] 09/26/2015, 1:02 PM

## 2015-09-26 NOTE — Progress Notes (Signed)
ANTICOAGULATION CONSULT NOTE - Initial Consult  Pharmacy Consult for Heparin  Indication: chest pain/ACS  No Known Allergies  Patient Measurements: Height:  (160 cm) Weight: 143 lb 11.8 oz (65.2 kg) IBW/kg (Calculated) : 52.4 Heparin Dosing Weight:   Vital Signs: Temp: 98.9 F (37.2 C) (02/21 0500) Temp Source: Oral (02/21 0500) BP: 120/46 mmHg (02/21 0600) Pulse Rate: 73 (02/21 0600)  Labs:  Recent Labs  09/25/15 1649 09/25/15 2149 09/26/15 0340 09/26/15 0702  HGB 13.8  --   --   --   HCT 43.3  --   --   --   PLT 296  --   --   --   APTT  --   --   --  24  LABPROT  --   --   --  12.8  INR  --   --   --  0.94  CREATININE 1.69* 1.47* 1.36*  --   TROPONINI <0.03 0.05* 0.15*  --     Estimated Creatinine Clearance: 31.9 mL/min (by C-G formula based on Cr of 1.36).   Medical History: Past Medical History  Diagnosis Date  . Diabetes mellitus without complication (HCC)   . Hypertension   . GERD (gastroesophageal reflux disease)     Medications:    Assessment: Pharmacy consulted to dose and monitor heparin therapy in this 75 year old female for ACS  Goal of Therapy:  Heparin level 0.3-0.7 units/ml Monitor platelets by anticoagulation protocol: Yes   Plan:  Give 3900 units bolus x 1 Start heparin infusion at 800 units/hr Check anti-Xa level in 8 hours and daily while on heparin Continue to monitor H&H and platelets   Heparin level ordered to be drawn @ 1630 on 2/21  Aneisa Karren D 09/26/2015,7:46 AM

## 2015-09-26 NOTE — Care Management (Signed)
Presently on DKA protocol in icu.  Patient was having some nausea and not feeling well last week and issues controlling her blood sugar.  Did not take insulin for approximately 3-4 days

## 2015-09-26 NOTE — Progress Notes (Signed)
[facility] - [place] at [facility]   PATIENT NAME: Vicki [name]    MR#:  [id number]  DATE OF BIRTH:  12/22/1940  SUBJECTIVE: Patient is seen today, admitted for DKA has been having nausea, vomiting abdominal pain. No DKA resolved, patient feels much better no nausea or vomiting or abdominal pain. Blood glucose less than not to 50 so we stopped the insulin drip.\giving her the long-acting insulin.   CHIEF COMPLAINT:   Chief Complaint  Patient presents with  . Chest Pain  . Emesis    REVIEW OF SYSTEMS:    Review of Systems  Constitutional: Positive for fever. Negative for chills.  HENT: Negative for hearing loss.   Eyes: Negative for blurred vision, double vision and photophobia.  Respiratory: Negative for cough, hemoptysis and shortness of breath.   Cardiovascular: Negative for chest pain, palpitations, orthopnea and leg swelling.  Gastrointestinal: Negative for heartburn, nausea, vomiting, abdominal pain and diarrhea.  Genitourinary: Negative for dysuria and urgency.  Musculoskeletal: Negative for myalgias and neck pain.  Skin: Negative for rash.  Neurological: Negative for dizziness, speech change, focal weakness, seizures, weakness and headaches.  Endo/Heme/Allergies: Does not bruise/bleed easily.  Psychiatric/Behavioral: Negative for memory loss. The patient does not have insomnia.     Nutrition:  Tolerating Diet: Tolerating PT:      DRUG ALLERGIES:  No Known Allergies  VITALS:  Blood pressure 152/65, pulse 73, temperature 98.9 F (37.2 C), temperature source Oral, resp. rate 15, height  (1.6 m), weight 65.2 kg (143 lb 11.8 oz), SpO2 94 %.  PHYSICAL EXAMINATION:   Physical Exam  GENERAL:  75 y.o.-year-old patient lying in the bed with no acute distress.  EYES: Pupils equal, round, reactive to light and accommodation. No scleral icterus. Extraocular muscles intact.  HEENT: Head atraumatic, normocephalic. Oropharynx and nasopharynx  clear.  NECK:  Supple, no jugular venous distention. No thyroid enlargement, no tenderness.  LUNGS: Normal breath sounds bilaterally, no wheezing, rales,rhonchi or crepitation. No use of accessory muscles of respiration.  CARDIOVASCULAR: S1, S2 normal. No murmurs, rubs, or gallops.  ABDOMEN: Soft, nontender, nondistended. Bowel sounds present. No organomegaly or mass.  EXTREMITIES: No pedal edema, cyanosis, or clubbing.  NEUROLOGIC: Cranial nerves II through XII are intact. Muscle strength 5/5 in all extremities. Sensation intact. Gait not checked.  PSYCHIATRIC: The patient is alert and oriented x 3.  SKIN: No obvious rash, lesion, or ulcer.    LABORATORY PANEL:   CBC  Recent Labs Lab 09/25/15 1649  WBC 16.0*  HGB 13.8  HCT 43.3  PLT 296   ------------------------------------------------------------------------------------------------------------------  Chemistries   Recent Labs Lab 09/26/15 0936  NA 142  K 3.5  CL 112*  CO2 27  GLUCOSE 103*  BUN 44*  CREATININE 1.01*  CALCIUM 8.8*   ------------------------------------------------------------------------------------------------------------------  Cardiac Enzymes  Recent Labs Lab 09/26/15 0936  TROPONINI 0.20*   ------------------------------------------------------------------------------------------------------------------  RADIOLOGY:  Dg Chest 2 View  09/25/2015  CLINICAL DATA:  Chest pain beginning 5 days ago. Intermittent shortness of breath. Generalized weakness. EXAM: CHEST  2 VIEW COMPARISON:  06/10/2015 FINDINGS: The cardiomediastinal silhouette is within normal limits. The lungs are well inflated and clear. No pleural effusion or pneumothorax is seen. Right upper quadrant abdominal surgical clips are noted. No acute osseous abnormality is identified. IMPRESSION: No active cardiopulmonary disease. Electronically Signed   By: Sebastian Ache M.D.   On: 09/25/2015 17:13     ASSESSMENT AND PLAN:   Active  Problems:   DKA (diabetic ketoacidoses) (  HCC)   Elevated troponin I level  DKA:; Off insulin drip at this time. Anion  gap is closed. Started back on Lantus and NovoLog. #2. Acute renal failure due to DKA: Resolved. #3 elevated troponins without chest pain. GI due to demand ischemia: No further cardiac workup at this time slightly #4 hypertension stable #5. Depression continue Celexa. #6 CK disease;3: Stable #7 diabetic gastroparesis nausea vomiting abdominal pain: Continue PPIs and Zofran.  All the records are reviewed and case discussed with Care Management/Social Workerr. Management plans discussed with the patient, family and they are in agreement.  CODE STATUS: full  TOTAL TIME TAKING CARE OF THIS PATIENT: 35 minutes.   POSSIBLE D/C IN 1-2  DAYS, DEPENDING ON CLINICAL CONDITION.   Katha Hamming M.D on 09/26/2015 at 1:12 PM  Between 7am to 6pm - Pager - [phone]  After 6pm go to [email or web address] - password [email or web address] [facility]  [place] [facility]  Office  [phone]  CC: Primary care physician; Clydie Braun, MD

## 2015-09-26 NOTE — Progress Notes (Signed)
Pt transitioned off insulin gtt today at 1330. SSSI and lantus ordered. Pt remains A & O x 4, NSR on cardiac monitor, lung sounds clear to auscultation on RA, and no complaints of pain. Report given to Tess, Charity fundraiser.

## 2015-09-26 NOTE — Progress Notes (Signed)
Inpatient Diabetes Program Recommendations  AACE/ADA: New Consensus Statement on Inpatient Glycemic Control (2015)  Target Ranges:  Prepandial:   less than 140 mg/dL      Peak postprandial:   less than 180 mg/dL (1-2 hours)      Critically ill patients:  140 - 180 mg/dL    Review of Glycemic Control  Results for Vicki [name], Vicki [name] (MRN [id number]) as of 09/26/2015 07:26  Ref. Range 09/26/2015 01:47 09/26/2015 02:29 09/26/2015 03:02 09/26/2015 03:40 09/26/2015 03:55 09/26/2015 04:53 09/26/2015 05:49 09/26/2015 06:52  Glucose-Capillary Latest Ref Range: 65-99 mg/dL 147 (H) 829 (H) 562 (H)  309 (H) 219 (H) 204 (H) 157 (H)    Diabetes history: Noted Outpatient Diabetes medications: Toujeo 32 units qhs, Novolog 2-10 units tid with meals  Current orders for Inpatient glycemic control: IV insulin in normal saline via DKA protocol  Inpatient Diabetes Program Recommendations:   Blood sugars now below /dl- per protocol, please change IV fluid to D5-1/2 NS per ordered rate  When the acidosis is corrected as determined by MD, consider 21 units Lantus qday 2 hours prior to stopping infusion, Novolog sensitive correction 0-9 units tid (1st dose is given when the drip is stopped), Novolog  0-5 units qhs and Novolog2 units tid with meals.  Susette Racer, RN, BA, MHA, CDE Diabetes Coordinator Inpatient Diabetes Program  [phone] (Team Pager) [phone] [facility] Office) 09/26/2015 7:37 AM

## 2015-09-27 LAB — GLUCOSE, CAPILLARY
Glucose-Capillary: 415 mg/dL — ABNORMAL HIGH (ref 65–99)
Glucose-Capillary: 404 mg/dL — ABNORMAL HIGH (ref 65–99)
Glucose-Capillary: 318 mg/dL — ABNORMAL HIGH (ref 65–99)
Glucose-Capillary: 276 mg/dL — ABNORMAL HIGH (ref 65–99)

## 2015-09-27 LAB — BASIC METABOLIC PANEL
Anion gap: 9 (ref 5–15)
Anion gap: 11 (ref 5–15)
Anion gap: 8 (ref 5–15)
Anion gap: 9 (ref 5–15)
Anion gap: 7 (ref 5–15)
BUN: 37 mg/dL — ABNORMAL HIGH (ref 6–20)
BUN: 34 mg/dL — ABNORMAL HIGH (ref 6–20)
BUN: 29 mg/dL — ABNORMAL HIGH (ref 6–20)
BUN: 29 mg/dL — ABNORMAL HIGH (ref 6–20)
BUN: 25 mg/dL — ABNORMAL HIGH (ref 6–20)
CO2: 23 mmol/L (ref 22–32)
CO2: 22 mmol/L (ref 22–32)
CO2: 24 mmol/L (ref 22–32)
CO2: 25 mmol/L (ref 22–32)
CO2: 25 mmol/L (ref 22–32)
Calcium: 8.3 mg/dL — ABNORMAL LOW (ref 8.9–10.3)
Calcium: 8.3 mg/dL — ABNORMAL LOW (ref 8.9–10.3)
Calcium: 8.5 mg/dL — ABNORMAL LOW (ref 8.9–10.3)
Calcium: 8.6 mg/dL — ABNORMAL LOW (ref 8.9–10.3)
Calcium: 8.4 mg/dL — ABNORMAL LOW (ref 8.9–10.3)
Chloride: 105 mmol/L (ref 101–111)
Chloride: 104 mmol/L (ref 101–111)
Chloride: 103 mmol/L (ref 101–111)
Chloride: 103 mmol/L (ref 101–111)
Chloride: 102 mmol/L (ref 101–111)
Creatinine, Ser: 0.97 mg/dL (ref 0.44–1.00)
Creatinine, Ser: 0.93 mg/dL (ref 0.44–1.00)
Creatinine, Ser: 0.93 mg/dL (ref 0.44–1.00)
Creatinine, Ser: 0.87 mg/dL (ref 0.44–1.00)
Creatinine, Ser: 0.81 mg/dL (ref 0.44–1.00)
GFR calc Af Amer: >60 mL/min (ref >60)
GFR calc Af Amer: >60 mL/min (ref >60)
GFR calc Af Amer: >60 mL/min (ref >60)
GFR calc Af Amer: >60 mL/min (ref >60)
GFR calc Af Amer: >60 mL/min (ref >60)
GFR calc non Af Amer: 55 mL/min — ABNORMAL LOW (ref >60)
GFR calc non Af Amer: 58 mL/min — ABNORMAL LOW (ref >60)
GFR calc non Af Amer: 58 mL/min — ABNORMAL LOW (ref >60)
GFR calc non Af Amer: >60 mL/min (ref >60)
GFR calc non Af Amer: >60 mL/min (ref >60)
Glucose, Bld: 364 mg/dL — ABNORMAL HIGH (ref 65–99)
Glucose, Bld: 369 mg/dL — ABNORMAL HIGH (ref 65–99)
Glucose, Bld: 389 mg/dL — ABNORMAL HIGH (ref 65–99)
Glucose, Bld: 369 mg/dL — ABNORMAL HIGH (ref 65–99)
Glucose, Bld: 336 mg/dL — ABNORMAL HIGH (ref 65–99)
Potassium: 3.3 mmol/L — ABNORMAL LOW (ref 3.5–5.1)
Potassium: 3.5 mmol/L (ref 3.5–5.1)
Potassium: 3.5 mmol/L (ref 3.5–5.1)
Potassium: 3.5 mmol/L (ref 3.5–5.1)
Potassium: 3.7 mmol/L (ref 3.5–5.1)
Sodium: 137 mmol/L (ref 135–145)
Sodium: 137 mmol/L (ref 135–145)
Sodium: 135 mmol/L (ref 135–145)
Sodium: 137 mmol/L (ref 135–145)
Sodium: 134 mmol/L — ABNORMAL LOW (ref 135–145)

## 2015-09-27 LAB — TROPONIN I
Troponin I: 0.11 ng/mL — ABNORMAL HIGH (ref <0.031)
Troponin I: 0.13 ng/mL — ABNORMAL HIGH (ref <0.031)
Troponin I: 0.14 ng/mL — ABNORMAL HIGH (ref <0.031)
Troponin I: 0.08 ng/mL — ABNORMAL HIGH (ref <0.031)
Troponin I: 0.1 ng/mL — ABNORMAL HIGH (ref <0.031)

## 2015-09-27 MED ORDER — INSULIN ASPART 100 UNIT/ML ~~LOC~~ SOLN
4.0000 [IU] | Freq: Three times a day (TID) | SUBCUTANEOUS | Status: DC
Start: 2015-09-27 — End: 2015-09-28
  Administered 2015-09-27 – 2015-09-28 (×2): 4 [IU] via SUBCUTANEOUS
  Filled 2015-09-27 (×2): qty 4
  Filled 2015-09-27: qty 10

## 2015-09-27 MED ORDER — INSULIN GLARGINE 100 UNIT/ML ~~LOC~~ SOLN
38.0000 [IU] | Freq: Every day | SUBCUTANEOUS | Status: DC
Start: 2015-09-27 — End: 2015-09-28
  Administered 2015-09-27: 38 [IU] via SUBCUTANEOUS
  Filled 2015-09-27 (×2): qty 0.38

## 2015-09-27 MED ORDER — PANTOPRAZOLE SODIUM 40 MG IV SOLR
40.0000 mg | Freq: Two times a day (BID) | INTRAVENOUS | Status: DC
  Administered 2015-09-27 – 2015-09-28 (×3): 40 mg via INTRAVENOUS
  Filled 2015-09-27 (×3): qty 40

## 2015-09-27 MED ORDER — ONDANSETRON HCL 4 MG/2ML IJ SOLN
4.0000 mg | Freq: Four times a day (QID) | INTRAMUSCULAR | Status: DC
  Administered 2015-09-27 – 2015-09-28 (×2): 4 mg via INTRAVENOUS
  Filled 2015-09-27 (×2): qty 2

## 2015-09-27 NOTE — Progress Notes (Signed)
[facility] - [place] at [facility]   PATIENT NAME: Vicki [name]    MR#:  [id number]  DATE OF BIRTH:  1941-04-29  SUBJECTIVE: Patient seen at bedside,, complains of stomach pain and heartburn. Her glucoses around 400.   CHIEF COMPLAINT:   Chief Complaint  Patient presents with  . Chest Pain  . Emesis    REVIEW OF SYSTEMS:    Review of Systems  Constitutional: Negative for fever and chills.  HENT: Negative for hearing loss.   Eyes: Negative for blurred vision, double vision and photophobia.  Respiratory: Negative for cough, hemoptysis and shortness of breath.   Cardiovascular: Negative for chest pain, palpitations, orthopnea and leg swelling.  Gastrointestinal: Negative for heartburn, nausea, vomiting, abdominal pain and diarrhea.  Genitourinary: Negative for dysuria and urgency.  Musculoskeletal: Negative for myalgias and neck pain.  Skin: Negative for rash.  Neurological: Negative for dizziness, speech change, focal weakness, seizures, weakness and headaches.  Endo/Heme/Allergies: Does not bruise/bleed easily.  Psychiatric/Behavioral: Negative for memory loss. The patient does not have insomnia.     Nutrition:  Tolerating Diet: Tolerating PT:      DRUG ALLERGIES:  No Known Allergies  VITALS:  Blood pressure 139/58, pulse 63, temperature 98.7 F (37.1 C), temperature source Oral, resp. rate 19, height  (1.6 m), weight 65.2 kg (143 lb 11.8 oz), SpO2 93 %.  PHYSICAL EXAMINATION:   Physical Exam  GENERAL:  75 y.o.-year-old patient lying in the bed with no acute distress.  EYES: Pupils equal, round, reactive to light and accommodation. No scleral icterus. Extraocular muscles intact.  HEENT: Head atraumatic, normocephalic. Oropharynx and nasopharynx clear.  NECK:  Supple, no jugular venous distention. No thyroid enlargement, no tenderness.  LUNGS: Normal breath sounds bilaterally, no wheezing, rales,rhonchi or crepitation. No use of  accessory muscles of respiration.  CARDIOVASCULAR: S1, S2 normal. No murmurs, rubs, or gallops.  ABDOMEN: Soft, slight  epigastric pain present. Bowel sounds present. No organomegaly or mass.  EXTREMITIES: No pedal edema, cyanosis, or clubbing.  NEUROLOGIC: Cranial nerves II through XII are intact. Muscle strength 5/5 in all extremities. Sensation intact. Gait not checked.  PSYCHIATRIC: The patient is alert and oriented x 3.  SKIN: No obvious rash, lesion, or ulcer.    LABORATORY PANEL:   CBC  Recent Labs Lab 09/25/15 1649  WBC 16.0*  HGB 13.8  HCT 43.3  PLT 296   ------------------------------------------------------------------------------------------------------------------  Chemistries   Recent Labs Lab 09/27/15 0331  NA 137  K 3.5  CL 104  CO2 22  GLUCOSE 369*  BUN 34*  CREATININE 0.93  CALCIUM 8.3*   ------------------------------------------------------------------------------------------------------------------  Cardiac Enzymes  Recent Labs Lab 09/27/15 0331  TROPONINI 0.13*   ------------------------------------------------------------------------------------------------------------------  RADIOLOGY:  Dg Chest 2 View  09/25/2015  CLINICAL DATA:  Chest pain beginning 5 days ago. Intermittent shortness of breath. Generalized weakness. EXAM: CHEST  2 VIEW COMPARISON:  06/10/2015 FINDINGS: The cardiomediastinal silhouette is within normal limits. The lungs are well inflated and clear. No pleural effusion or pneumothorax is seen. Right upper quadrant abdominal surgical clips are noted. No acute osseous abnormality is identified. IMPRESSION: No active cardiopulmonary disease. Electronically Signed   By: Sebastian Ache M.D.   On: 09/25/2015 17:13     ASSESSMENT AND PLAN:   Active Problems:   DKA (diabetic ketoacidoses) (HCC)   Elevated troponin I level  DKA:; Off insulin drip at this time. Anion  gap is closed. Started back on Lantus and NovoLog. At just  the  Lantus dose further. #2. Acute renal failure due to DKA: Resolved. #3 elevated troponins without chest pain. GI due to demand ischemia: No further cardiac workup at this time slightly #4 hypertension stable #5. Depression continue Celexa. #6 CK disease;3: Stable #7 diabetic gastroparesis nausea vomiting ,abdominal pain: Check, vomiting resolved. Still has epigastric pain, patient has history of reflux. Continue IV PPIs, add Carafate. Transfer her to regular floor today. Likely discharge tomorrow. All the records are reviewed and case discussed with Care Management/Social Workerr. Management plans discussed with the patient, family and they are in agreement.  CODE STATUS: full  TOTAL TIME TAKING CARE OF THIS PATIENT: 25  minutes.   POSSIBLE D/C IN 1-2  DAYS, DEPENDING ON CLINICAL CONDITION.   Katha Hamming M.D on 09/27/2015 at 11:37 AM  Between 7am to 6pm - Pager - [phone]  After 6pm go to [email or web address] - password [email or web address] [facility]  [place] [facility]  Office  [phone]  CC: Primary care physician; Clydie Braun, MD

## 2015-09-27 NOTE — Progress Notes (Signed)
Pt remains Vicki [name] & O x 4 on RA with no complaints of pain this shift. Pt just recently complained of nausea with new orders Zofan  IV q6 PRN per Dr. Nemiah Commander; however pt declined the medication stating she felt better. Pt is now off the insulin gtt and has received new insulin adjustments today per Dr Luberta Mutter, S. Pt is currently awaiting Vicki [name] bed on the Med-Surg unit.

## 2015-09-27 NOTE — Progress Notes (Addendum)
Inpatient Diabetes Program Recommendations  AACE/ADA: New Consensus Statement on Inpatient Glycemic Control (2015)  Target Ranges:  Prepandial:   less than 140 mg/dL      Peak postprandial:   less than 180 mg/dL (1-2 hours)      Critically ill patients:  140 - 180 mg/dL   Results for Peltzer, Vicki [name] (MRN [id number]) as of 09/27/2015 11:14  Ref. Range 09/26/2015 12:55 09/26/2015 13:42 09/26/2015 15:57 09/26/2015 22:01  Glucose-Capillary Latest Ref Range: 65-99 mg/dL 409 (H) 811 (H) 914 (H) 323 (H)    Results for Caccavale, Vicki [name] (MRN [id number]) as of 09/27/2015 11:14  Ref. Range 09/27/2015 07:20  Glucose-Capillary Latest Ref Range: 65-99 mg/dL 086 (H)    Admit with: DKA  History: DM  Home DM Meds: Toujeo insulin- 38 units QHS       Humalog 1-8 units tidwc per SSI       Humalog 8 units tidwc  Current Insulin Orders: Lantus 21 units QHS      Novolog Sensitive Correction Scale/ SSI (0-9 units) TID AC + HS      Novolog 2 units tidwc      MD- Patient transitioned off IV insulin drip yesterday to Lantus and Novolog.  AM glucose 415 mg/dl by fingerstick.  Please consider the following in-hospital insulin adjustments:  Please increase Lantus to 38 units QHS   Please also increase Novolog Meal Coverage to 4 units tidwc   Addendum 2:30pm: Spoke with pt in person to verify home doses of insulin.  Per pt, she takes Toujeo insulin 38 units QHS and Humalog 8 units tidwc + Extra Humalog SSI if CBGs are high at home.  Sees Dr. Tedd Sias as an outpatient.  Note Lantus and Novolog Meal coverage increased today.     --Will follow patient during hospitalization--  Ambrose Finland RN, MSN, CDE Diabetes Coordinator Inpatient Glycemic Control Team Team Pager: [phone] (8a-5p)

## 2015-09-28 LAB — GLUCOSE, CAPILLARY
Glucose-Capillary: 344 mg/dL — ABNORMAL HIGH (ref 65–99)
Glucose-Capillary: 349 mg/dL — ABNORMAL HIGH (ref 65–99)

## 2015-09-28 LAB — TROPONIN I
Troponin I: 0.05 ng/mL — ABNORMAL HIGH (ref <0.031)
Troponin I: 0.07 ng/mL — ABNORMAL HIGH (ref <0.031)
Troponin I: 0.08 ng/mL — ABNORMAL HIGH (ref <0.031)

## 2015-09-28 MED ORDER — INSULIN ASPART 100 UNIT/ML ~~LOC~~ SOLN
10.0000 [IU] | Freq: Three times a day (TID) | SUBCUTANEOUS | Status: DC
  Administered 2015-09-28: 10 [IU] via SUBCUTANEOUS
  Filled 2015-09-28: qty 10

## 2015-09-28 MED ORDER — INSULIN GLARGINE 300 UNIT/ML ~~LOC~~ SOPN: 40.0000 [IU] | PEN_INJECTOR | Freq: Every day | SUBCUTANEOUS | Status: DC

## 2015-09-28 MED ORDER — OMEPRAZOLE 20 MG PO CPDR: 20.0000 mg | DELAYED_RELEASE_CAPSULE | Freq: Every day | ORAL | Status: DC

## 2015-09-28 NOTE — Discharge Summary (Signed)
Vicki [name], is a 75 y.o. female  DOB October 28, 1940  MRN [id number].  Admission date:  09/25/2015  Admitting Physician  Arnaldo Natal, MD  Discharge Date:  09/28/2015   Primary MD  Clydie Braun, MD  Recommendations for primary care physician for things to follow:   Follow-up with primary doctor Dr. Sampson Goon and now 1 week. Follow-up with endocrinologist also in 1-2 weeks.   Admission Diagnosis  chest and abd pain   Discharge Diagnosis  chest and abd pain    Active Problems:   DKA (diabetic ketoacidoses) (HCC)   Elevated troponin I level      Past Medical History  Diagnosis Date  . Diabetes mellitus without complication (HCC)   . Hypertension   . GERD (gastroesophageal reflux disease)     Past Surgical History  Procedure Laterality Date  . Abdominal hysterectomy         History of present illness and  Hospital Course:     Kindly see H&P for history of present illness and admission details, please review complete Labs, Consult reports and Test reports for all details in brief  HPI  from the history and physical done on the day of admission  75 year old female patient with history of diabetes mellitus comes in because of chest pain, elevated blood glucose at the DKA. Patient had nausea, vomiting and abdominal pain on admission. Admitted to ICU for DKA, started on insulin drip, IV fluids.  Hospital Course  #1.DKA likely secondary to poorly controlled diabetes and noncompliance with medication: Patient any anion gap  On admission 26, blood sugar 715. Patient started on IV hydration, IV insulin drip. Patient anion gap closed, nausea vomiting also resolved. Started on the diet, change at the insulin to Lantus, NovoLog. Patient started on Toujeo before she came. Advise that patient needs to do increase  her dose up  To 40 units to help with BG control and  Continue Humalog  as per sliding scale with before meals Patient also is seen by inpatient diabetic creatinine. The disease process and advised to be compliant with medications and follow-up with endocrinologist.  The abdominal pain and burning in the stomach: Likely due to gastritis: Started on IV place. Patient symptoms improved. Discharging him with Prilosec 20 mg by mouth daily. Hypertension: Controlled with quinapril, labetalol. #4 depression continue Celexa. #5 history of glaucoma continue, begun, Timoptic eyedrops. #6. Acute renal failure secondary to DKA: Improved with IV hydration \. #7 elevated troponins without evidence of chest pain: Seen by cardiology Dr. Osborne Casco recommended that demand ischemia due to DKA as an acute coronary syndrome. Troponin up to 0.15 but dropped down to 0.07.    Discharge Condition: stable   Follow UP  Follow-up Information    Follow up with FITZGERALD, DAVID, MD In 1 week.   Specialty:  Infectious Diseases   Contact information:   [address] [place] [address] [phone]         Discharge Instructions  and  Discharge Medications        Medication List    TAKE these medications        acetaminophen 500 MG tablet  Commonly known as:  TYLENOL  Take 1,000-1,500 mg by mouth every 6 (six) hours as needed for mild pain or headache.     citalopram 20 MG tablet  Commonly known as:  CELEXA  Take 20 mg by mouth daily.     COMBIGAN 0.2-0.5 % ophthalmic solution  Generic drug:  brimonidine-timolol  Place 1 drop into both eyes daily.     hydrochlorothiazide 12.5 MG capsule  Commonly known as:  MICROZIDE  Take 12.5 mg by mouth daily.     Insulin Glargine 300 UNIT/ML Sopn  Commonly known as:  TOUJEO SOLOSTAR  Inject 40 Units into the skin at bedtime.     insulin lispro 100 UNIT/ML injection  Commonly known as:  HUMALOG  Inject 2-10 Units into the skin 3 (three) times  daily before meals. Pt uses per sliding scale.     labetalol 100 MG tablet  Commonly known as:  NORMODYNE  Take 100 mg by mouth 2 (two) times daily.     lidocaine 5 %  Commonly known as:  LIDODERM  Place 1 patch onto the skin every 12 (twelve) hours. Remove & Discard patch within 12 hours or as directed by MD     loratadine 10 MG tablet  Commonly known as:  CLARITIN  Take 10 mg by mouth daily.     lovastatin 20 MG tablet  Commonly known as:  MEVACOR  Take 20 mg by mouth at bedtime.     magnesium oxide 400 MG tablet  Commonly known as:  MAG-OX  Take 400 mg by mouth daily.     omeprazole 20 MG capsule  Commonly known as:  PRILOSEC  Take 1 capsule (20 mg total) by mouth daily.     polyethylene glycol packet  Commonly known as:  MIRALAX / GLYCOLAX  Take 17 g by mouth daily.     potassium chloride 10 MEQ tablet  Commonly known as:  K-DUR,KLOR-CON  Take 10 mEq by mouth daily.     quinapril 20 MG tablet  Commonly known as:  ACCUPRIL  Take 20 mg by mouth daily.          Diet and Activity recommendation: See Discharge Instructions above   Consults obtained -cardio   Major procedures and Radiology Reports - PLEASE review detailed and final reports for all details, in brief -    Dg Chest 2 View  09/25/2015  CLINICAL DATA:  Chest pain beginning 5 days ago. Intermittent shortness of breath. Generalized weakness. EXAM: CHEST  2 VIEW COMPARISON:  06/10/2015 FINDINGS: The cardiomediastinal silhouette is within normal limits. The lungs are well inflated and clear. No pleural effusion or pneumothorax is seen. Right upper quadrant abdominal surgical clips are noted. No acute osseous abnormality is identified. IMPRESSION: No active cardiopulmonary disease. Electronically Signed   By: Sebastian Ache M.D.   On: 09/25/2015 17:13    Micro Results     Recent Results (from the past 240 hour(s))  MRSA PCR Screening     Status: None   Collection Time: 09/25/15  9:44 PM  Result Value  Ref Range Status   MRSA by PCR NEGATIVE NEGATIVE Final    Comment:        The GeneXpert MRSA Assay (FDA approved for NASAL specimens only), is one component of a comprehensive MRSA colonization surveillance program. It is not intended to diagnose MRSA infection nor to guide or monitor treatment for MRSA infections.        Today   Subjective:   Vicki [name] today has no headache,no chest abdominal pain,no new weakness tingling or numbness, feels much better wants to go home today.   Objective:   Blood pressure 144/88, pulse 64, temperature 98.3 F (36.8 C), temperature source Oral, resp. rate 18, height  (1.6 m), weight 65.2 kg (143 lb 11.8 oz), SpO2 98 %.  Intake/Output Summary (Last 24 hours) at 09/28/15 0945 Last data filed at 09/28/15 0402  Gross per 24 hour  Intake     45 ml  Output    300 ml  Net   -255 ml    Exam Awake Alert, Oriented x 3, No new F.N deficits, Normal affect [place].AT,PERRAL Supple Neck,No JVD, No cervical lymphadenopathy appriciated.  Symmetrical Chest wall movement, Good air movement bilaterally, CTAB RRR,No Gallops,Rubs or new Murmurs, No Parasternal Heave +ve B.Sounds, Abd Soft, Non tender, No organomegaly appriciated, No rebound -guarding or rigidity. No Cyanosis, Clubbing or edema, No new Rash or bruise  Data Review   CBC w Diff: Lab Results  Component Value Date   WBC 16.0* 09/25/2015   WBC 7.0 04/08/2012   HGB 13.8 09/25/2015   HGB 10.7* 04/08/2012   HCT 43.3 09/25/2015   HCT 31.8* 04/08/2012   PLT 296 09/25/2015   PLT 201 04/08/2012   LYMPHOPCT 37 06/10/2015   LYMPHOPCT 52.8 04/08/2012   MONOPCT 7 06/10/2015   MONOPCT 6.1 04/08/2012   MONOPCT 12 04/04/2012   EOSPCT 4 06/10/2015   EOSPCT 2.6 04/08/2012   BASOPCT 1 06/10/2015   BASOPCT 0.5 04/08/2012    CMP: Lab Results  Component Value Date   NA 134* 09/27/2015   NA 142 04/09/2012   K 3.7 09/27/2015   K 4.0 04/09/2012   CL 102 09/27/2015   CL 109* 04/09/2012    CO2 25 09/27/2015   CO2 27 04/09/2012   BUN 25* 09/27/2015   BUN 9 04/09/2012   CREATININE 0.81 09/27/2015   CREATININE 0.61 04/09/2012   PROT 7.1 05/22/2015   PROT 7.2 02/17/2012   ALBUMIN 3.9 05/22/2015   ALBUMIN 3.2* 02/17/2012   BILITOT 0.5 05/22/2015   BILITOT 0.7 02/17/2012   ALKPHOS 62 05/22/2015   ALKPHOS 99 02/17/2012   AST 25 05/22/2015   AST 31 02/17/2012   ALT 19 05/22/2015   ALT 45 02/17/2012  .   Total Time in preparing paper work, data evaluation and todays exam - 35 minutes  Dallas Torok M.D on 09/28/2015 at 9:45 AM    Note: This dictation was prepared with Dragon dictation along with smaller phrase technology. Any transcriptional errors that result from this process are unintentional.

## 2015-09-28 NOTE — Progress Notes (Signed)
Inpatient Diabetes Program Recommendations  AACE/ADA: New Consensus Statement on Inpatient Glycemic Control (2015)  Target Ranges:  Prepandial:   less than 140 mg/dL      Peak postprandial:   less than 180 mg/dL (1-2 hours)      Critically ill patients:  140 - 180 mg/dL   Review of Glycemic Control  Results for Vicki [name], Vicki [name] (MRN [id number]) as of 09/28/2015 08:11  Ref. Range 09/27/2015 07:20 09/27/2015 11:33 09/27/2015 16:22 09/27/2015 21:14 09/28/2015 07:49  Glucose-Capillary Latest Ref Range: 65-99 mg/dL 409 (H) 811 (H) 914 (H) 276 (H) 344 (H)    Diabetes history: Noted Outpatient Diabetes medications: Toujeo insulin- 38 units QHS, Humalog 1-8 units tidwc per SSI,  Humalog 8 units tidwc  Current orders for Inpatient glycemic control: Lantus 38 units qday, Novolog 0-9 units tid with meals, Novolog 0-5 units qhs, Novolog 4 units tid with meals  Inpatient Diabetes Program Recommendations: Fasting blood sugar /dl, consider increasing Lantus to 45 units qhs, increase Novolog mealtime to 6 units tid(hold if patient eats less than 50%) and increase Novolog correction to moderate correction tid  Susette Racer, RN, [place], [place], CDE Diabetes Coordinator Inpatient Diabetes Program  [phone] (Team Pager) [phone] [facility] Office) 09/28/2015 8:15 AM

## 2015-09-28 NOTE — Progress Notes (Signed)
Confirmed with patient- she is a patient of Dr. Tedd Sias and is ordered Toujeo 38 units qday, Novolog 8 units tid with meals and "sliding scale" insulin in addition at meals 1-8 units- she has not been compliant with her insulin.  Discussed the importance of taking it consistently.  Consider same pre-hospital insulin doses for discharge.  Patient does not take Lantus insulin- she was switched to Toujeo.Susette Racer, RN, BA, MHA, CDE Diabetes Coordinator Inpatient Diabetes Program  [phone] (Team Pager) [phone] [facility] Office) 09/28/2015 9:28 AM

## 2015-09-28 NOTE — Progress Notes (Signed)
patient troponin 0.10. Md notified.Telemetry restarted and Troponin reordered

## 2015-09-28 NOTE — Progress Notes (Signed)
Pt discharged home via w/c. I/s pt to go tp pharmacy and retrieve lantus and adm 40 units at noc and stop teegeo

## 2015-09-28 NOTE — Care Management Important Message (Signed)
Important Message  Patient Details  Name: Vicki [name] MRN: [id number] Date of Birth: Jun 16, 1941   Medicare Important Message Given:  Yes    Arieon Corcoran A, RN 09/28/2015, 8:26 AM

## 2015-10-02 ENCOUNTER — Other Ambulatory Visit: Payer: Self-pay | Admitting: Internal Medicine

## 2015-10-02 DIAGNOSIS — Z1239 Encounter for other screening for malignant neoplasm of breast: Secondary | ICD-10-CM | POA: Diagnosis not present

## 2015-10-02 DIAGNOSIS — Z1231 Encounter for screening mammogram for malignant neoplasm of breast: Secondary | ICD-10-CM

## 2015-10-02 DIAGNOSIS — Z780 Asymptomatic menopausal state: Secondary | ICD-10-CM | POA: Diagnosis not present

## 2015-10-02 DIAGNOSIS — E7800 Pure hypercholesterolemia, unspecified: Secondary | ICD-10-CM | POA: Diagnosis not present

## 2015-10-02 DIAGNOSIS — E10319 Type 1 diabetes mellitus with unspecified diabetic retinopathy without macular edema: Secondary | ICD-10-CM | POA: Diagnosis not present

## 2015-10-02 DIAGNOSIS — E1065 Type 1 diabetes mellitus with hyperglycemia: Secondary | ICD-10-CM | POA: Diagnosis not present

## 2015-10-02 DIAGNOSIS — K219 Gastro-esophageal reflux disease without esophagitis: Secondary | ICD-10-CM | POA: Diagnosis not present

## 2015-10-02 DIAGNOSIS — M5136 Other intervertebral disc degeneration, lumbar region: Secondary | ICD-10-CM | POA: Diagnosis not present

## 2015-10-02 DIAGNOSIS — I2510 Atherosclerotic heart disease of native coronary artery without angina pectoris: Secondary | ICD-10-CM | POA: Diagnosis not present

## 2015-10-02 DIAGNOSIS — I10 Essential (primary) hypertension: Secondary | ICD-10-CM | POA: Diagnosis not present

## 2015-10-10 ENCOUNTER — Ambulatory Visit: Payer: Commercial Managed Care - HMO | Attending: Internal Medicine

## 2015-10-12 DIAGNOSIS — Z8673 Personal history of transient ischemic attack (TIA), and cerebral infarction without residual deficits: Secondary | ICD-10-CM | POA: Diagnosis not present

## 2015-10-12 DIAGNOSIS — I10 Essential (primary) hypertension: Secondary | ICD-10-CM | POA: Diagnosis not present

## 2015-10-12 DIAGNOSIS — R0602 Shortness of breath: Secondary | ICD-10-CM | POA: Diagnosis not present

## 2015-10-12 DIAGNOSIS — E119 Type 2 diabetes mellitus without complications: Secondary | ICD-10-CM | POA: Diagnosis not present

## 2015-10-12 DIAGNOSIS — K219 Gastro-esophageal reflux disease without esophagitis: Secondary | ICD-10-CM | POA: Diagnosis not present

## 2015-10-12 DIAGNOSIS — I2510 Atherosclerotic heart disease of native coronary artery without angina pectoris: Secondary | ICD-10-CM | POA: Diagnosis not present

## 2015-10-12 DIAGNOSIS — I208 Other forms of angina pectoris: Secondary | ICD-10-CM | POA: Diagnosis not present

## 2015-10-12 DIAGNOSIS — F329 Major depressive disorder, single episode, unspecified: Secondary | ICD-10-CM | POA: Diagnosis not present

## 2015-10-12 DIAGNOSIS — E785 Hyperlipidemia, unspecified: Secondary | ICD-10-CM | POA: Diagnosis not present

## 2015-10-25 DIAGNOSIS — E1065 Type 1 diabetes mellitus with hyperglycemia: Secondary | ICD-10-CM | POA: Diagnosis not present

## 2015-11-02 DIAGNOSIS — E1065 Type 1 diabetes mellitus with hyperglycemia: Secondary | ICD-10-CM | POA: Diagnosis not present

## 2015-11-28 ENCOUNTER — Emergency Department
Admission: EM | Admit: 2015-11-28 | Discharge: 2015-11-28 | Disposition: A | Discharge status: Home / Self Care | Payer: Commercial Managed Care - HMO | Attending: Emergency Medicine | Admitting: Emergency Medicine

## 2015-11-28 ENCOUNTER — Encounter: Payer: Self-pay | Admitting: *Deleted

## 2015-11-28 DIAGNOSIS — Z794 Long term (current) use of insulin: Secondary | ICD-10-CM | POA: Insufficient documentation

## 2015-11-28 DIAGNOSIS — I10 Essential (primary) hypertension: Secondary | ICD-10-CM | POA: Insufficient documentation

## 2015-11-28 DIAGNOSIS — E162 Hypoglycemia, unspecified: Secondary | ICD-10-CM

## 2015-11-28 DIAGNOSIS — IMO0001 Reserved for inherently not codable concepts without codable children: Secondary | ICD-10-CM

## 2015-11-28 DIAGNOSIS — R030 Elevated blood-pressure reading, without diagnosis of hypertension: Secondary | ICD-10-CM

## 2015-11-28 DIAGNOSIS — E11649 Type 2 diabetes mellitus with hypoglycemia without coma: Secondary | ICD-10-CM | POA: Diagnosis not present

## 2015-11-28 LAB — CBC
HEMATOCRIT: 38 % (ref 35.0–47.0)
Hemoglobin: 12.7 g/dL (ref 12.0–16.0)
MCH: 30.4 pg (ref 26.0–34.0)
MCHC: 33.3 g/dL (ref 32.0–36.0)
MCV: 91.3 fL (ref 80.0–100.0)
Platelets: 251 10*3/uL (ref 150–440)
RBC: 4.16 MIL/uL (ref 3.80–5.20)
RDW: 13.4 % (ref 11.5–14.5)
WBC: 7.4 10*3/uL (ref 3.6–11.0)

## 2015-11-28 LAB — BASIC METABOLIC PANEL
Anion gap: 8 (ref 5–15)
BUN: 18 mg/dL (ref 6–20)
CALCIUM: 8.9 mg/dL (ref 8.9–10.3)
CO2: 26 mmol/L (ref 22–32)
Chloride: 98 mmol/L — ABNORMAL LOW (ref 101–111)
Creatinine, Ser: 0.7 mg/dL (ref 0.44–1.00)
GFR calc Af Amer: >60 mL/min (ref >60)
GLUCOSE: 316 mg/dL — AB (ref 65–99)
Potassium: 4.3 mmol/L (ref 3.5–5.1)
Sodium: 132 mmol/L — ABNORMAL LOW (ref 135–145)

## 2015-11-28 LAB — GLUCOSE, CAPILLARY
GLUCOSE-CAPILLARY: 419 mg/dL — AB (ref 65–99)
Glucose-Capillary: 291 mg/dL — ABNORMAL HIGH (ref 65–99)
Glucose-Capillary: 408 mg/dL — ABNORMAL HIGH (ref 65–99)

## 2015-11-28 MED ORDER — LABETALOL HCL 100 MG PO TABS: 100.0000 mg | ORAL_TABLET | Freq: Once | ORAL | Status: DC

## 2015-11-28 MED ORDER — HYDROCHLOROTHIAZIDE 12.5 MG PO CAPS: 12.5000 mg | ORAL_CAPSULE | Freq: Every day | ORAL | Status: DC

## 2015-11-28 MED ORDER — INSULIN ASPART 100 UNIT/ML ~~LOC~~ SOLN: 6.0000 [IU] | Freq: Once | SUBCUTANEOUS | Status: DC

## 2015-11-28 MED ORDER — INSULIN ASPART 100 UNIT/ML ~~LOC~~ SOLN
6.0000 [IU] | Freq: Once | SUBCUTANEOUS | Status: AC
  Administered 2015-11-28: 6 [IU] via SUBCUTANEOUS

## 2015-11-28 MED ORDER — INSULIN ASPART 100 UNIT/ML ~~LOC~~ SOLN
SUBCUTANEOUS | Status: AC
  Administered 2015-11-28: 6 [IU] via SUBCUTANEOUS
  Filled 2015-11-28: qty 6

## 2015-11-28 NOTE — ED Notes (Addendum)
Pt states an episode of hypoglycemia this AM, states EMs reported her sugar was 37 and then 163 after ab amp of d50, pt hx of DM 2, awake and alert, states she thought it was high when she woke up and gave herself novolog injection, states she did not eat breakfast this AM

## 2015-11-28 NOTE — Discharge Instructions (Signed)
Stay with family tonight, see your doctor tomorrow.

## 2015-11-28 NOTE — ED Notes (Signed)
Pt informed to return if any life threatening symptoms occur.  

## 2015-11-28 NOTE — ED Provider Notes (Addendum)
[facility] Emergency Department Provider Note  ____________________________________________   I have reviewed the triage vital signs and the nursing notes.   HISTORY  Chief Complaint Hypoglycemia    HPI Vicki [name] is a 75 y.o. female presents today complaining of hypoglycemia. Patient states she overslept this morning when she woke up she missed breakfast and thought her sugar was high so she gave herself her NovoLog but that turned out it was actually low. She hadn't checked it prior to giving her supple NovoLog. Even when even lower. She did not pass out but she did havevery low sugar. EMS gave her D50 and she has no other complaints.    Past Medical History  Diagnosis Date  . Diabetes mellitus without complication (HCC)   . Hypertension   . GERD (gastroesophageal reflux disease)     Patient Active Problem List   Diagnosis Date Noted  . Elevated troponin I level 09/26/2015  . DKA (diabetic ketoacidoses) (HCC) 09/25/2015    Past Surgical History  Procedure Laterality Date  . Abdominal hysterectomy      Current Outpatient Rx  Name  Route  Sig  Dispense  Refill  . acetaminophen (TYLENOL) 500 MG tablet   Oral   Take 1,000-1,500 mg by mouth every 6 (six) hours as needed for mild pain or headache.         . brimonidine-timolol (COMBIGAN) 0.2-0.5 % ophthalmic solution   Both Eyes   Place 1 drop into both eyes daily.         . citalopram (CELEXA) 20 MG tablet   Oral   Take 20 mg by mouth daily.         . hydrochlorothiazide (MICROZIDE) 12.5 MG capsule   Oral   Take 12.5 mg by mouth daily.         . Insulin Glargine (TOUJEO SOLOSTAR) 300 UNIT/ML SOPN   Subcutaneous   Inject 40 Units into the skin at bedtime.   1 pen   0   . insulin lispro (HUMALOG) 100 UNIT/ML injection   Subcutaneous   Inject 2-10 Units into the skin 3 (three) times daily before meals. Pt uses per sliding scale.         . labetalol  (NORMODYNE) 100 MG tablet   Oral   Take 100 mg by mouth 2 (two) times daily.         Marland Kitchen lidocaine (LIDODERM) 5 %   Transdermal   Place 1 patch onto the skin every 12 (twelve) hours. Remove & Discard patch within 12 hours or as directed by MD Patient not taking: Reported on 09/25/2015   10 patch   0   . loratadine (CLARITIN) 10 MG tablet   Oral   Take 10 mg by mouth daily.         Marland Kitchen lovastatin (MEVACOR) 20 MG tablet   Oral   Take 20 mg by mouth at bedtime.         . magnesium oxide (MAG-OX) 400 MG tablet   Oral   Take 400 mg by mouth daily.         Marland Kitchen omeprazole (PRILOSEC) 20 MG capsule   Oral   Take 1 capsule (20 mg total) by mouth daily.   30 capsule   0   . polyethylene glycol (MIRALAX / GLYCOLAX) packet   Oral   Take 17 g by mouth daily. Patient not taking: Reported on 09/25/2015   14 each   0   . potassium  chloride (K-DUR,KLOR-CON) 10 MEQ tablet   Oral   Take 10 mEq by mouth daily.         . quinapril (ACCUPRIL) 20 MG tablet   Oral   Take 20 mg by mouth daily.           Allergies Review of patient's allergies indicates no known allergies.  Family History  Problem Relation Age of Onset  . Diabetes Mellitus II Mother   . CAD Father   . Hypertension Sister     Social History Social History  Substance Use Topics  . Smoking status: Never Smoker   . Smokeless tobacco: None  . Alcohol Use: No    Review of Systems Constitutional: No fever/chills Eyes: No visual changes. ENT: No sore throat. No stiff neck no neck pain Cardiovascular: Denies chest pain. Respiratory: Denies shortness of breath. Gastrointestinal:   no vomiting.  No diarrhea.  No constipation. Genitourinary: Negative for dysuria. Musculoskeletal: Negative lower extremity swelling Skin: Negative for rash. Neurological: Negative for headaches, focal weakness or numbness. 10-point ROS otherwise negative.  ____________________________________________   PHYSICAL EXAM:  VITAL  SIGNS: ED Triage Vitals  Enc Vitals Group     BP 11/28/15 1336 172/70 mmHg     Pulse Rate 11/28/15 1336 73     Resp 11/28/15 1336 18     Temp 11/28/15 1336 98.9 F (37.2 C)     Temp Source 11/28/15 1336 Oral     SpO2 11/28/15 1336 99 %     Weight 11/28/15 1336 150 lb (68.04 kg)     Height 11/28/15 1336  (1.626 m)     Head Cir --      Peak Flow --      Pain Score 11/28/15 1607 0     Pain Loc --      Pain Edu? --      Excl. in GC? --     Constitutional: Alert and oriented. Well appearing and in no acute distress. Eyes: Conjunctivae are normal. PERRL. EOMI. Head: Atraumatic. Nose: No congestion/rhinnorhea. Mouth/Throat: Mucous membranes are moist.  Oropharynx non-erythematous. Neck: No stridor.   Nontender with no meningismus Cardiovascular: Normal rate, regular rhythm. Grossly normal heart sounds.  Good peripheral circulation. Respiratory: Normal respiratory effort.  No retractions. Lungs CTAB. Abdominal: Soft and nontender. No distention. No guarding no rebound Back:  There is no focal tenderness or step off there is no midline tenderness there are no lesions noted. there is no CVA tenderness Musculoskeletal: No lower extremity tenderness. No joint effusions, no DVT signs strong distal pulses no edema Neurologic:  Normal speech and language. No gross focal neurologic deficits are appreciated.  Skin:  Skin is warm, dry and intact. No rash noted. Psychiatric: Mood and affect are normal. Speech and behavior are normal.  ____________________________________________   LABS (all labs ordered are listed, but only abnormal results are displayed)  Labs Reviewed  BASIC METABOLIC PANEL - Abnormal; Notable for the following:    Sodium 132 (*)    Chloride 98 (*)    Glucose, Bld 316 (*)    All other components within normal limits  GLUCOSE, CAPILLARY - Abnormal; Notable for the following:    Glucose-Capillary 291 (*)    All other components within normal limits  GLUCOSE,  CAPILLARY - Abnormal; Notable for the following:    Glucose-Capillary 419 (*)    All other components within normal limits  GLUCOSE, CAPILLARY - Abnormal; Notable for the following:    Glucose-Capillary 408 (*)  All other components within normal limits  CBC  CBG MONITORING, ED  CBG MONITORING, ED  CBG MONITORING, ED   ____________________________________________  EKG  I personally interpreted any EKGs ordered by me or triage  ____________________________________________  RADIOLOGY  I reviewed any imaging ordered by me or triage that were performed during my shift and, if possible, patient and/or family made aware of any abnormal findings. ____________________________________________   PROCEDURES  Procedure(s) performed: None  Critical Care performed: None  ____________________________________________   INITIAL IMPRESSION / ASSESSMENT AND PLAN / ED COURSE  Pertinent labs & imaging results that were available during my care of the patient were reviewed by me and considered in my medical decision making (see chart for details).  Patient with low blood sugar which is not corrected. It is running high but she normally runs high when we see her. There is no evidence of DKA or other acute pathology.  ----------------------------------------- 5:29 PM on 11/28/2015 -----------------------------------------  Patient had one elevated blood pressure 235 which I believe to be erroneous, the cuff was much too small for her. We'll retract it is 209. She has no headache no chest pain or shortness of breath no nausea no vomiting no no symptoms of any type. Patient states she did not take any of her blood pressure medication today and she would like to be discharged to do so. She is on a sliding scale throughout the day. Her sugar is around 400 which is not she states markedly high for her and she states that she likely just needs to have 6 or 10 of insulin and she'll be fine. In any  event patient has no complaints and no symptoms of any variety and she would like to go home. We will give her a dose of her home blood pressure medication here prior to discharge, and we will have her follow up closely with her doctor. We will have her restart her insulin regimen   FINAL CLINICAL IMPRESSION(S) / ED DIAGNOSES  Final diagnoses:  None      This chart was dictated using voice recognition software.  Despite best efforts to proofread,  errors can occur which can change meaning.    Jeanmarie PlantJames A McShane, MD 11/28/15 1723  Jeanmarie PlantJames A McShane, MD 11/28/15 [phone]

## 2015-12-05 DIAGNOSIS — I208 Other forms of angina pectoris: Secondary | ICD-10-CM | POA: Diagnosis not present

## 2015-12-05 DIAGNOSIS — R0602 Shortness of breath: Secondary | ICD-10-CM | POA: Diagnosis not present

## 2015-12-26 DIAGNOSIS — E1065 Type 1 diabetes mellitus with hyperglycemia: Secondary | ICD-10-CM | POA: Diagnosis not present

## 2015-12-26 DIAGNOSIS — I2510 Atherosclerotic heart disease of native coronary artery without angina pectoris: Secondary | ICD-10-CM | POA: Diagnosis not present

## 2015-12-26 DIAGNOSIS — I10 Essential (primary) hypertension: Secondary | ICD-10-CM | POA: Diagnosis not present

## 2016-01-02 DIAGNOSIS — E10319 Type 1 diabetes mellitus with unspecified diabetic retinopathy without macular edema: Secondary | ICD-10-CM | POA: Diagnosis not present

## 2016-01-02 DIAGNOSIS — E1065 Type 1 diabetes mellitus with hyperglycemia: Secondary | ICD-10-CM | POA: Diagnosis not present

## 2016-01-02 DIAGNOSIS — Z1239 Encounter for other screening for malignant neoplasm of breast: Secondary | ICD-10-CM | POA: Diagnosis not present

## 2016-01-02 DIAGNOSIS — H3500 Unspecified background retinopathy: Secondary | ICD-10-CM | POA: Diagnosis not present

## 2016-01-02 DIAGNOSIS — M150 Primary generalized (osteo)arthritis: Secondary | ICD-10-CM | POA: Diagnosis not present

## 2016-01-02 DIAGNOSIS — M5136 Other intervertebral disc degeneration, lumbar region: Secondary | ICD-10-CM | POA: Diagnosis not present

## 2016-01-02 DIAGNOSIS — E782 Mixed hyperlipidemia: Secondary | ICD-10-CM | POA: Diagnosis not present

## 2016-01-02 DIAGNOSIS — K219 Gastro-esophageal reflux disease without esophagitis: Secondary | ICD-10-CM | POA: Diagnosis not present

## 2016-01-02 DIAGNOSIS — I10 Essential (primary) hypertension: Secondary | ICD-10-CM | POA: Diagnosis not present

## 2016-01-02 DIAGNOSIS — F3340 Major depressive disorder, recurrent, in remission, unspecified: Secondary | ICD-10-CM | POA: Diagnosis not present

## 2016-01-02 DIAGNOSIS — I2510 Atherosclerotic heart disease of native coronary artery without angina pectoris: Secondary | ICD-10-CM | POA: Diagnosis not present

## 2016-01-02 DIAGNOSIS — E7800 Pure hypercholesterolemia, unspecified: Secondary | ICD-10-CM | POA: Diagnosis not present

## 2016-01-18 ENCOUNTER — Ambulatory Visit: Payer: Commercial Managed Care - HMO | Attending: Internal Medicine

## 2016-01-26 DIAGNOSIS — Z780 Asymptomatic menopausal state: Secondary | ICD-10-CM | POA: Diagnosis not present

## 2016-03-22 ENCOUNTER — Inpatient Hospital Stay
Admission: EM | Admit: 2016-03-22 | Discharge: 2016-03-26 | DRG: 639 | Disposition: A | Discharge status: Home Health | Payer: Commercial Managed Care - HMO | Attending: Internal Medicine | Admitting: Internal Medicine

## 2016-03-22 ENCOUNTER — Ambulatory Visit: Payer: Commercial Managed Care - HMO

## 2016-03-22 ENCOUNTER — Emergency Department: Payer: Commercial Managed Care - HMO

## 2016-03-22 ENCOUNTER — Encounter: Payer: Self-pay | Admitting: Emergency Medicine

## 2016-03-22 DIAGNOSIS — R112 Nausea with vomiting, unspecified: Secondary | ICD-10-CM

## 2016-03-22 DIAGNOSIS — E876 Hypokalemia: Secondary | ICD-10-CM

## 2016-03-22 DIAGNOSIS — E119 Type 2 diabetes mellitus without complications: Secondary | ICD-10-CM | POA: Diagnosis not present

## 2016-03-22 DIAGNOSIS — T383X6A Underdosing of insulin and oral hypoglycemic [antidiabetic] drugs, initial encounter: Secondary | ICD-10-CM | POA: Diagnosis present

## 2016-03-22 DIAGNOSIS — Z8249 Family history of ischemic heart disease and other diseases of the circulatory system: Secondary | ICD-10-CM | POA: Diagnosis not present

## 2016-03-22 DIAGNOSIS — I2510 Atherosclerotic heart disease of native coronary artery without angina pectoris: Secondary | ICD-10-CM | POA: Diagnosis not present

## 2016-03-22 DIAGNOSIS — E860 Dehydration: Secondary | ICD-10-CM | POA: Diagnosis present

## 2016-03-22 DIAGNOSIS — R079 Chest pain, unspecified: Secondary | ICD-10-CM | POA: Diagnosis not present

## 2016-03-22 DIAGNOSIS — I10 Essential (primary) hypertension: Secondary | ICD-10-CM | POA: Diagnosis not present

## 2016-03-22 DIAGNOSIS — Z79899 Other long term (current) drug therapy: Secondary | ICD-10-CM

## 2016-03-22 DIAGNOSIS — Z833 Family history of diabetes mellitus: Secondary | ICD-10-CM

## 2016-03-22 DIAGNOSIS — Z794 Long term (current) use of insulin: Secondary | ICD-10-CM

## 2016-03-22 DIAGNOSIS — Z23 Encounter for immunization: Secondary | ICD-10-CM | POA: Diagnosis not present

## 2016-03-22 DIAGNOSIS — K219 Gastro-esophageal reflux disease without esophagitis: Secondary | ICD-10-CM | POA: Diagnosis not present

## 2016-03-22 DIAGNOSIS — Z91120 Patient's intentional underdosing of medication regimen due to financial hardship: Secondary | ICD-10-CM

## 2016-03-22 DIAGNOSIS — R072 Precordial pain: Secondary | ICD-10-CM | POA: Diagnosis not present

## 2016-03-22 DIAGNOSIS — E1169 Type 2 diabetes mellitus with other specified complication: Secondary | ICD-10-CM | POA: Diagnosis not present

## 2016-03-22 DIAGNOSIS — Y92009 Unspecified place in unspecified non-institutional (private) residence as the place of occurrence of the external cause: Secondary | ICD-10-CM

## 2016-03-22 DIAGNOSIS — E1310 Other specified diabetes mellitus with ketoacidosis without coma: Secondary | ICD-10-CM | POA: Diagnosis not present

## 2016-03-22 DIAGNOSIS — R0789 Other chest pain: Secondary | ICD-10-CM | POA: Diagnosis present

## 2016-03-22 DIAGNOSIS — R1084 Generalized abdominal pain: Secondary | ICD-10-CM

## 2016-03-22 DIAGNOSIS — E871 Hypo-osmolality and hyponatremia: Secondary | ICD-10-CM | POA: Diagnosis not present

## 2016-03-22 DIAGNOSIS — E1110 Type 2 diabetes mellitus with ketoacidosis without coma: Secondary | ICD-10-CM | POA: Diagnosis present

## 2016-03-22 DIAGNOSIS — N289 Disorder of kidney and ureter, unspecified: Secondary | ICD-10-CM | POA: Diagnosis present

## 2016-03-22 DIAGNOSIS — E1165 Type 2 diabetes mellitus with hyperglycemia: Secondary | ICD-10-CM | POA: Diagnosis not present

## 2016-03-22 DIAGNOSIS — E872 Acidosis: Secondary | ICD-10-CM | POA: Diagnosis not present

## 2016-03-22 LAB — BASIC METABOLIC PANEL
Anion gap: 17 — ABNORMAL HIGH (ref 5–15)
Anion gap: 9 (ref 5–15)
BUN: 35 mg/dL — ABNORMAL HIGH (ref 6–20)
BUN: 31 mg/dL — AB (ref 6–20)
CALCIUM: 9.4 mg/dL (ref 8.9–10.3)
CHLORIDE: 95 mmol/L — AB (ref 101–111)
CO2: 17 mmol/L — AB (ref 22–32)
CO2: 22 mmol/L (ref 22–32)
CREATININE: 1.12 mg/dL — AB (ref 0.44–1.00)
CREATININE: 0.93 mg/dL (ref 0.44–1.00)
Calcium: 8.9 mg/dL (ref 8.9–10.3)
Chloride: 104 mmol/L (ref 101–111)
GFR calc Af Amer: 53 mL/min — ABNORMAL LOW (ref >60)
GFR calc Af Amer: >60 mL/min (ref >60)
GFR calc non Af Amer: 46 mL/min — ABNORMAL LOW (ref >60)
GFR, EST NON AFRICAN AMERICAN: 58 mL/min — AB (ref >60)
GLUCOSE: 765 mg/dL — AB (ref 65–99)
Glucose, Bld: 372 mg/dL — ABNORMAL HIGH (ref 65–99)
Potassium: 4.7 mmol/L (ref 3.5–5.1)
Potassium: 3.8 mmol/L (ref 3.5–5.1)
SODIUM: 135 mmol/L (ref 135–145)
Sodium: 129 mmol/L — ABNORMAL LOW (ref 135–145)

## 2016-03-22 LAB — CBC
HCT: 37.4 % (ref 35.0–47.0)
HEMATOCRIT: 36.8 % (ref 35.0–47.0)
HEMOGLOBIN: 13 g/dL (ref 12.0–16.0)
Hemoglobin: 12.4 g/dL (ref 12.0–16.0)
MCH: 32.2 pg (ref 26.0–34.0)
MCH: 30.9 pg (ref 26.0–34.0)
MCHC: 34.7 g/dL (ref 32.0–36.0)
MCHC: 33.7 g/dL (ref 32.0–36.0)
MCV: 92.7 fL (ref 80.0–100.0)
MCV: 91.6 fL (ref 80.0–100.0)
PLATELETS: 255 10*3/uL (ref 150–440)
Platelets: 265 10*3/uL (ref 150–440)
RBC: 4.03 MIL/uL (ref 3.80–5.20)
RBC: 4.02 MIL/uL (ref 3.80–5.20)
RDW: 13.5 % (ref 11.5–14.5)
RDW: 13.4 % (ref 11.5–14.5)
WBC: 9.5 10*3/uL (ref 3.6–11.0)
WBC: 8 10*3/uL (ref 3.6–11.0)

## 2016-03-22 LAB — GLUCOSE, CAPILLARY
GLUCOSE-CAPILLARY: 532 mg/dL — AB (ref 65–99)
GLUCOSE-CAPILLARY: 400 mg/dL — AB (ref 65–99)
GLUCOSE-CAPILLARY: 362 mg/dL — AB (ref 65–99)
GLUCOSE-CAPILLARY: 178 mg/dL — AB (ref 65–99)
GLUCOSE-CAPILLARY: 129 mg/dL — AB (ref 65–99)
Glucose-Capillary: 104 mg/dL — ABNORMAL HIGH (ref 65–99)
Glucose-Capillary: 151 mg/dL — ABNORMAL HIGH (ref 65–99)
Glucose-Capillary: 220 mg/dL — ABNORMAL HIGH (ref 65–99)
Glucose-Capillary: 314 mg/dL — ABNORMAL HIGH (ref 65–99)

## 2016-03-22 LAB — TROPONIN I: Troponin I: <0.03 ng/mL (ref <0.03)

## 2016-03-22 LAB — MAGNESIUM: Magnesium: 1.7 mg/dL (ref 1.7–2.4)

## 2016-03-22 MED ORDER — SODIUM CHLORIDE 0.9 % IV BOLUS (SEPSIS)
1000.0000 mL | Freq: Once | INTRAVENOUS | Status: AC
  Administered 2016-03-22: 1000 mL via INTRAVENOUS

## 2016-03-22 MED ORDER — SODIUM CHLORIDE 0.9 % IV SOLN
INTRAVENOUS | Status: DC
  Administered 2016-03-22: 4.7 [IU]/h via INTRAVENOUS
  Filled 2016-03-22: qty 2.5

## 2016-03-22 MED ORDER — ONDANSETRON HCL 4 MG PO TABS: 4.0000 mg | ORAL_TABLET | Freq: Four times a day (QID) | ORAL | Status: DC | PRN

## 2016-03-22 MED ORDER — ONDANSETRON HCL 4 MG/2ML IJ SOLN
4.0000 mg | Freq: Four times a day (QID) | INTRAMUSCULAR | Status: DC | PRN
Start: 2016-03-22 — End: 2016-03-26

## 2016-03-22 MED ORDER — POTASSIUM CHLORIDE 10 MEQ/100ML IV SOLN
10.0000 meq | INTRAVENOUS | Status: AC
  Administered 2016-03-22 (×2): 10 meq via INTRAVENOUS
  Filled 2016-03-22 (×2): qty 100

## 2016-03-22 MED ORDER — ONDANSETRON HCL 4 MG/2ML IJ SOLN
4.0000 mg | Freq: Once | INTRAMUSCULAR | Status: AC
  Administered 2016-03-22: 4 mg via INTRAVENOUS

## 2016-03-22 MED ORDER — LABETALOL HCL 200 MG PO TABS
100.0000 mg | ORAL_TABLET | Freq: Two times a day (BID) | ORAL | Status: DC
  Administered 2016-03-22 – 2016-03-23 (×2): 100 mg via ORAL
  Filled 2016-03-22: qty 0.5
  Filled 2016-03-22: qty 2
  Filled 2016-03-22: qty 1

## 2016-03-22 MED ORDER — DEXTROSE-NACL 5-0.45 % IV SOLN
INTRAVENOUS | Status: DC
  Administered 2016-03-22: 21:00:00 via INTRAVENOUS

## 2016-03-22 MED ORDER — ACETAMINOPHEN 325 MG PO TABS
650.0000 mg | ORAL_TABLET | Freq: Four times a day (QID) | ORAL | Status: DC | PRN
  Administered 2016-03-23 – 2016-03-25 (×2): 650 mg via ORAL
  Filled 2016-03-22 (×2): qty 2

## 2016-03-22 MED ORDER — PNEUMOCOCCAL VAC POLYVALENT 25 MCG/0.5ML IJ INJ
0.5000 mL | INJECTION | INTRAMUSCULAR | Status: AC
  Administered 2016-03-25: 0.5 mL via INTRAMUSCULAR
  Filled 2016-03-22: qty 0.5

## 2016-03-22 MED ORDER — SODIUM CHLORIDE 0.9 % IV SOLN
INTRAVENOUS | Status: AC
  Administered 2016-03-22: 16:00:00 via INTRAVENOUS

## 2016-03-22 MED ORDER — ONDANSETRON HCL 4 MG/2ML IJ SOLN
INTRAMUSCULAR | Status: AC
  Administered 2016-03-22: 4 mg via INTRAVENOUS
  Filled 2016-03-22: qty 2

## 2016-03-22 MED ORDER — SODIUM CHLORIDE 0.9 % IV SOLN
INTRAVENOUS | Status: DC
  Administered 2016-03-22: 2.7 [IU]/h via INTRAVENOUS
  Filled 2016-03-22: qty 2.5

## 2016-03-22 MED ORDER — ACETAMINOPHEN 650 MG RE SUPP: 650.0000 mg | Freq: Four times a day (QID) | RECTAL | Status: DC | PRN

## 2016-03-22 MED ORDER — ALBUTEROL SULFATE (2.5 MG/3ML) 0.083% IN NEBU
2.5000 mg | INHALATION_SOLUTION | RESPIRATORY_TRACT | Status: DC | PRN
Start: 2016-03-22 — End: 2016-03-26

## 2016-03-22 MED ORDER — INSULIN ASPART 100 UNIT/ML ~~LOC~~ SOLN
8.0000 [IU] | Freq: Once | SUBCUTANEOUS | Status: AC
  Administered 2016-03-22: 8 [IU] via INTRAVENOUS
  Filled 2016-03-22: qty 8

## 2016-03-22 MED ORDER — ENOXAPARIN SODIUM 40 MG/0.4ML ~~LOC~~ SOLN
40.0000 mg | SUBCUTANEOUS | Status: DC
  Administered 2016-03-22 – 2016-03-25 (×4): 40 mg via SUBCUTANEOUS
  Filled 2016-03-22 (×4): qty 0.4

## 2016-03-22 MED ORDER — SODIUM CHLORIDE 0.9 % IV SOLN
INTRAVENOUS | Status: DC
  Administered 2016-03-22 (×2): via INTRAVENOUS

## 2016-03-22 NOTE — ED Notes (Signed)
Patient denies pain and is resting comfortably.  

## 2016-03-22 NOTE — Progress Notes (Signed)
RN spoke with Dr. Imogene Burnhen and made MD aware that patient's blood pressure is 178/70 and patient's home med is not ordered for tonight, she takes labetalol 100mg  BID.  MD gave order for labetalol per home med list.

## 2016-03-22 NOTE — ED Notes (Signed)
Blood glucose checked.

## 2016-03-22 NOTE — ED Provider Notes (Signed)
[facility] Emergency Department Provider Note  Time seen: 3:07 PM  I have reviewed the triage vital signs and the nursing notes.   HISTORY  Chief Complaint Chest Pain    HPI Vicki [name] is a 75 y.o. female with a past medical history of diabetes, CAD, hypertension who presents to the emergency department for nausea, vomiting, chest pain.According to the patient since yesterday she has been feeling very tired, has been urinating excessively, this morning she was nauseated with multiple rounds of vomiting and then developed chest pain after vomiting. States the chest pain is largely resolved, but she was concerned enough to bring her to the emergency department. Upon arrival to the emergency department patient's blood glucose is "high." Patient's only complaint currently is feeling tired with some mild chest pressure.  Past Medical History:  Diagnosis Date  . Diabetes mellitus without complication (HCC)   . GERD (gastroesophageal reflux disease)   . Hypertension     Patient Active Problem List   Diagnosis Date Noted  . Elevated troponin I level 09/26/2015  . DKA (diabetic ketoacidoses) (HCC) 09/25/2015    Past Surgical History:  Procedure Laterality Date  . ABDOMINAL HYSTERECTOMY      Prior to Admission medications   Medication Sig Start Date End Date Taking? Authorizing Provider  acetaminophen (TYLENOL) 500 MG tablet Take 1,000-1,500 mg by mouth every 6 (six) hours as needed for mild pain or headache.    Historical Provider, MD  brimonidine-timolol (COMBIGAN) 0.2-0.5 % ophthalmic solution Place 1 drop into both eyes daily.    Historical Provider, MD  citalopram (CELEXA) 20 MG tablet Take 20 mg by mouth daily.    Historical Provider, MD  hydrochlorothiazide (MICROZIDE) 12.5 MG capsule Take 12.5 mg by mouth daily.    Historical Provider, MD  Insulin Glargine (TOUJEO SOLOSTAR) 300 UNIT/ML SOPN Inject 40 Units into the skin at bedtime. 09/28/15    Katha HammingSnehalatha Konidena, MD  insulin lispro (HUMALOG) 100 UNIT/ML injection Inject 2-10 Units into the skin 3 (three) times daily before meals. Pt uses per sliding scale.    Historical Provider, MD  labetalol (NORMODYNE) 100 MG tablet Take 100 mg by mouth 2 (two) times daily.    Historical Provider, MD  lidocaine (LIDODERM) 5 % Place 1 patch onto the skin every 12 (twelve) hours. Remove & Discard patch within 12 hours or as directed by MD Patient not taking: Reported on 09/25/2015 06/10/15 06/09/16  Jennye MoccasinBrian S Quigley, MD  loratadine (CLARITIN) 10 MG tablet Take 10 mg by mouth daily.    Historical Provider, MD  lovastatin (MEVACOR) 20 MG tablet Take 20 mg by mouth at bedtime.    Historical Provider, MD  magnesium oxide (MAG-OX) 400 MG tablet Take 400 mg by mouth daily.    Historical Provider, MD  omeprazole (PRILOSEC) 20 MG capsule Take 1 capsule (20 mg total) by mouth daily. 09/28/15   Katha HammingSnehalatha Konidena, MD  polyethylene glycol (MIRALAX / GLYCOLAX) packet Take 17 g by mouth daily. Patient not taking: Reported on 09/25/2015 05/03/15   Emily FilbertJonathan E Williams, MD  potassium chloride (K-DUR,KLOR-CON) 10 MEQ tablet Take 10 mEq by mouth daily.    Historical Provider, MD  quinapril (ACCUPRIL) 20 MG tablet Take 20 mg by mouth daily.    Historical Provider, MD    No Known Allergies  Family History  Problem Relation Age of Onset  . Diabetes Mellitus II Mother   . CAD Father   . Hypertension Sister     Social History  Social History  Substance Use Topics  . Smoking status: Never Smoker  . Smokeless tobacco: Not on file  . Alcohol use No    Review of Systems Constitutional: Negative for fever. Cardiovascular: Mild chest pain. Respiratory: Negative for shortness of breath. Gastrointestinal: Negative for abdominal pain. Nausea and vomiting. Genitourinary: Negative for dysuria . Positive for urinary frequency. Neurological: Negative for headache 10-point ROS otherwise  negative.  ____________________________________________   PHYSICAL EXAM:  VITAL SIGNS: ED Triage Vitals [03/22/16 1313]  Enc Vitals Group     BP (!) 154/63     Pulse Rate 81     Resp 18     Temp 98.5 F (36.9 C)     Temp Source Oral     SpO2 95 %     Weight 150 lb (68 kg)     Height 5\' 5"  (1.651 m)     Head Circumference      Peak Flow      Pain Score 6     Pain Loc      Pain Edu?      Excl. in GC?     Constitutional: Alert and oriented. Well appearing and in no distress. Eyes: Normal exam ENT   Head: Normocephalic and atraumatic.   Nose: No congestion/rhinnorhea.   Mouth/Throat: Mucous membranes are moist. Cardiovascular: Normal rate, regular rhythm. No murmurs, rubs, or gallops. Respiratory: Normal respiratory effort without tachypnea nor retractions. Breath sounds are clear and equal bilaterally. No wheezes/rales/rhonchi. Gastrointestinal: Soft and nontender. No distention.   Musculoskeletal: Nontender with normal range of motion in all extremities Neurologic:  Normal speech and language. No gross focal neurologic deficits are appreciated. Skin:  Skin is warm, dry and intact.  Psychiatric: Mood and affect are normal. Speech and behavior are normal.   ____________________________________________    EKG  EKG reviewed and interpreted by myself shows normal sinus rhythm at 78 bpm, narrow QRS, normal axis, normal intervals, no concerning ST changes. No ST elevation.  ____________________________________________    RADIOLOGY  Chest x-ray negative  ____________________________________________   INITIAL IMPRESSION / ASSESSMENT AND PLAN / ED COURSE  Pertinent labs & imaging results that were available during my care of the patient were reviewed by me and considered in my medical decision making (see chart for details).  The patient presents to the emergency department with chest discomfort, nausea, vomiting and elevated blood glucose. Patient's CBG is  read as "high.". Patient's labs show a glucose of 765 with anion gap of 17. We will check a VBG, IV hydrate, dose insulin and closer monitoring the emergency department. Patient's troponin is negative, chest x-ray is reassuring, EKG shows no acute change. I suspect the patient's chest discomfort was likely due to vomiting. We will continue to closely monitor the patient will awaiting further lab results to decide upon disposition.  Patient's labs meet criteria for DKA. We have started on insulin drip will admit to the hospital.  ____________________________________________   FINAL CLINICAL IMPRESSION(S) / ED DIAGNOSES  Chest pain Hyperglycemia    Minna AntisKevin Ivett Luebbe, MD 03/22/16 [phone]

## 2016-03-22 NOTE — ED Notes (Signed)
Report given to ICU

## 2016-03-22 NOTE — ED Notes (Signed)
AAOx3.  Skin warm and dry.  NAD 

## 2016-03-22 NOTE — H&P (Signed)
[facility] [place] at [facility]   PATIENT NAME: Vicki [name]    MR#:  [id number]  DATE OF BIRTH:  08-20-1940  DATE OF ADMISSION:  03/22/2016  PRIMARY CARE PHYSICIAN: Mick Sell, MD   REQUESTING/REFERRING PHYSICIAN: Minna Antis, MD  CHIEF COMPLAINT:   Chief Complaint  Patient presents with  . Chest Pain   Abdominal pain, Nausea, vomiting and chest pain today. HISTORY OF PRESENT ILLNESS:  Vicki [name]  is a 75 y.o. female with a known history of Hypertension and diabetes. The patient presently ED with the nausea and vomiting and abdominal pain today. She hasn't used insulin for the past 2 days. She developed the chest pain after vomiting, which resolved later. Her blood sugar is 765 with anion gap 17.  PAST MEDICAL HISTORY:   Past Medical History:  Diagnosis Date  . Diabetes mellitus without complication (HCC)   . GERD (gastroesophageal reflux disease)   . Hypertension     PAST SURGICAL HISTORY:   Past Surgical History:  Procedure Laterality Date  . ABDOMINAL HYSTERECTOMY      SOCIAL HISTORY:   Social History  Substance Use Topics  . Smoking status: Never Smoker  . Smokeless tobacco: Not on file  . Alcohol use No    FAMILY HISTORY:   Family History  Problem Relation Age of Onset  . Diabetes Mellitus II Mother   . CAD Father   . Hypertension Sister     DRUG ALLERGIES:  No Known Allergies  REVIEW OF SYSTEMS:   Review of Systems  Constitutional: Positive for malaise/fatigue. Negative for chills and fever.  HENT: Negative for congestion.   Eyes: Negative for blurred vision and double vision.  Respiratory: Negative for cough, sputum production, shortness of breath and wheezing.   Cardiovascular: Positive for chest pain. Negative for palpitations, orthopnea and leg swelling.  Gastrointestinal: Positive for abdominal pain, diarrhea, nausea and vomiting. Negative for blood in stool and melena.  Genitourinary: Negative  for dysuria, hematuria and urgency.  Musculoskeletal: Negative for joint pain and myalgias.  Skin: Negative for rash.  Neurological: Positive for weakness. Negative for dizziness, tingling, focal weakness, loss of consciousness and headaches.  Psychiatric/Behavioral: Negative for depression.    MEDICATIONS AT HOME:   Prior to Admission medications   Medication Sig Start Date End Date Taking? Authorizing Provider  acetaminophen (TYLENOL) 500 MG tablet Take 1,000-1,500 mg by mouth every 6 (six) hours as needed for mild pain or headache.    Historical Provider, MD  brimonidine-timolol (COMBIGAN) 0.2-0.5 % ophthalmic solution Place 1 drop into both eyes daily.    Historical Provider, MD  citalopram (CELEXA) 20 MG tablet Take 20 mg by mouth daily.    Historical Provider, MD  hydrochlorothiazide (MICROZIDE) 12.5 MG capsule Take 12.5 mg by mouth daily.    Historical Provider, MD  Insulin Glargine (TOUJEO SOLOSTAR) 300 UNIT/ML SOPN Inject 40 Units into the skin at bedtime. 09/28/15   Katha Hamming, MD  insulin lispro (HUMALOG) 100 UNIT/ML injection Inject 2-10 Units into the skin 3 (three) times daily before meals. Pt uses per sliding scale.    Historical Provider, MD  labetalol (NORMODYNE) 100 MG tablet Take 100 mg by mouth 2 (two) times daily.    Historical Provider, MD  lidocaine (LIDODERM) 5 % Place 1 patch onto the skin every 12 (twelve) hours. Remove & Discard patch within 12 hours or as directed by MD Patient not taking: Reported on 09/25/2015 06/10/15 06/09/16  Jennye Moccasin, MD  loratadine (CLARITIN) 10 MG tablet Take 10 mg by mouth daily.    Historical Provider, MD  lovastatin (MEVACOR) 20 MG tablet Take 20 mg by mouth at bedtime.    Historical Provider, MD  magnesium oxide (MAG-OX) 400 MG tablet Take 400 mg by mouth daily.    Historical Provider, MD  omeprazole (PRILOSEC) 20 MG capsule Take 1 capsule (20 mg total) by mouth daily. 09/28/15   Katha HammingSnehalatha Konidena, MD  polyethylene glycol  (MIRALAX / GLYCOLAX) packet Take 17 g by mouth daily. Patient not taking: Reported on 09/25/2015 05/03/15   Emily FilbertJonathan E Williams, MD  potassium chloride (K-DUR,KLOR-CON) 10 MEQ tablet Take 10 mEq by mouth daily.    Historical Provider, MD  quinapril (ACCUPRIL) 20 MG tablet Take 20 mg by mouth daily.    Historical Provider, MD      VITAL SIGNS:  Blood pressure (!) 150/62, pulse 80, temperature 98.5 F (36.9 C), temperature source Oral, resp. rate 16, height 5\' 5"  (1.651 m), weight 150 lb (68 kg), SpO2 96 %.  PHYSICAL EXAMINATION:  Physical Exam  GENERAL:  75 y.o.-year-old patient lying in the bed with no acute distress. Obese. EYES: Pupils equal, round, reactive to light and accommodation. No scleral icterus. Extraocular muscles intact.  HEENT: Head atraumatic, normocephalic. Oropharynx and nasopharynx clear.  NECK:  Supple, no jugular venous distention. No thyroid enlargement, no tenderness.  LUNGS: Normal breath sounds bilaterally, no wheezing, rales,rhonchi or crepitation. No use of accessory muscles of respiration.  CARDIOVASCULAR: S1, S2 normal. No murmurs, rubs, or gallops.  ABDOMEN: Soft, nontender, nondistended. Bowel sounds present. No organomegaly or mass.  EXTREMITIES: No pedal edema, cyanosis, or clubbing.  NEUROLOGIC: Cranial nerves II through XII are intact. Muscle strength 5/5 in all extremities. Sensation intact. Gait not checked.  PSYCHIATRIC: The patient is alert and oriented x 3.  SKIN: No obvious rash, lesion, or ulcer.   LABORATORY PANEL:   CBC  Recent Labs Lab 03/22/16 1315  WBC 9.5  HGB 13.0  HCT 37.4  PLT 255   ------------------------------------------------------------------------------------------------------------------  Chemistries   Recent Labs Lab 03/22/16 1315  NA 129*  K 4.7  CL 95*  CO2 17*  GLUCOSE 765*  BUN 35*  CREATININE 1.12*  CALCIUM 9.4    ------------------------------------------------------------------------------------------------------------------  Cardiac Enzymes  Recent Labs Lab 03/22/16 1315  TROPONINI <0.03   ------------------------------------------------------------------------------------------------------------------  RADIOLOGY:  Dg Chest 2 View  Result Date: 03/22/2016 CLINICAL DATA:  One week of substernal chest pain with increased symptoms today. Fatigue and weakness. No vomiting. History of hypertension and diabetes. Nonsmoker. EXAM: CHEST  2 VIEW COMPARISON:  PA and lateral chest x-ray dated September 25, 2015 FINDINGS: The lungs are mildly hyperinflated with mild hemidiaphragm flattening. There is no focal infiltrate. There is no pleural effusion. There is no pneumothorax or pneumomediastinum. The heart and pulmonary vascularity are normal. There is calcification in the wall of the thoracic aorta. The bony thorax exhibits no acute abnormality. IMPRESSION: Mild hyperinflation with hemidiaphragm flattening. This may be voluntary or could reflect reactive airway disease or COPD. There is no pneumonia, CHF, nor other acute cardiopulmonary abnormality. Aortic atherosclerosis. Electronically Signed   By: David  Swazil[place]Jordan M.D.   On: 03/22/2016 13:58      IMPRESSION AND PLAN:   DKA With uncontrolled diabetes. The patient will be admitted to stepdown unit. Start insulin drip with normal saline IV and potassium supplement.  Nothing by mouth except medication for now, Follow-up DKA protocol, follow-up BMP every 4 hours. Zofran when necessary.  Hyponatremia.  Possible pseudo-hyponatremia due to hyperglycemia. Follow-up BMP.  Dehydration. Continue normal saline IV and follow-up BMP. Hypertension. Continue hypertension medication.  All the records are reviewed and case discussed with ED provider. Management plans discussed with the patient, family and they are in agreement.  CODE STATUS: DNR  TOTAL CRITICAL TIME  TAKING CARE OF THIS PATIENT: 53 minutes.    Shaune Pollackhen, Danyel Tobey M.D on 03/22/2016 at 4:31 PM  Between 7am to 6pm - Pager - [phone]  After 6pm go to [email or web address] - Scientist, research (life [email or web address])password [email or web address]  [facility]  Office  [phone]  CC: Primary care physician; FITZGERALD, DAVID Demetrius CharityP, MD   Note: This dictation was prepared with Dragon dictation along with smaller phrase technology. Any transcriptional errors that result from this process are unintentional.

## 2016-03-22 NOTE — ED Notes (Signed)
Called to give report to the ICU.  Nurse not available and will call back for report.

## 2016-03-22 NOTE — ED Triage Notes (Signed)
Pt c/o substernal chest pain X 1 week. Worse today. Also c/o inner ear problems and some vomiting this AM. Denies SHOB.

## 2016-03-23 LAB — CBC
HEMATOCRIT: 34.4 % — AB (ref 35.0–47.0)
Hemoglobin: 11.9 g/dL — ABNORMAL LOW (ref 12.0–16.0)
MCH: 31.4 pg (ref 26.0–34.0)
MCHC: 34.7 g/dL (ref 32.0–36.0)
MCV: 90.3 fL (ref 80.0–100.0)
Platelets: 249 10*3/uL (ref 150–440)
RBC: 3.81 MIL/uL (ref 3.80–5.20)
RDW: 13.3 % (ref 11.5–14.5)
WBC: 9.2 10*3/uL (ref 3.6–11.0)

## 2016-03-23 LAB — BASIC METABOLIC PANEL
Anion gap: 6 (ref 5–15)
Anion gap: 6 (ref 5–15)
BUN: 24 mg/dL — AB (ref 6–20)
BUN: 26 mg/dL — AB (ref 6–20)
CALCIUM: 8.9 mg/dL (ref 8.9–10.3)
CO2: 25 mmol/L (ref 22–32)
CO2: 24 mmol/L (ref 22–32)
Calcium: 8.7 mg/dL — ABNORMAL LOW (ref 8.9–10.3)
Chloride: 106 mmol/L (ref 101–111)
Chloride: 105 mmol/L (ref 101–111)
Creatinine, Ser: 0.89 mg/dL (ref 0.44–1.00)
Creatinine, Ser: 0.73 mg/dL (ref 0.44–1.00)
GFR calc Af Amer: >60 mL/min (ref >60)
GFR calc Af Amer: >60 mL/min (ref >60)
GLUCOSE: 101 mg/dL — AB (ref 65–99)
GLUCOSE: 227 mg/dL — AB (ref 65–99)
POTASSIUM: 3.5 mmol/L (ref 3.5–5.1)
Potassium: 3.4 mmol/L — ABNORMAL LOW (ref 3.5–5.1)
Sodium: 137 mmol/L (ref 135–145)
Sodium: 135 mmol/L (ref 135–145)

## 2016-03-23 LAB — HEMOGLOBIN A1C
Hgb A1c MFr Bld: 12.6 % — ABNORMAL HIGH (ref 4.0–6.0)
Hgb A1c MFr Bld: 12.7 % — ABNORMAL HIGH (ref 4.0–6.0)

## 2016-03-23 LAB — GLUCOSE, CAPILLARY
GLUCOSE-CAPILLARY: 205 mg/dL — AB (ref 65–99)
GLUCOSE-CAPILLARY: 292 mg/dL — AB (ref 65–99)
GLUCOSE-CAPILLARY: 306 mg/dL — AB (ref 65–99)
GLUCOSE-CAPILLARY: 421 mg/dL — AB (ref 65–99)
GLUCOSE-CAPILLARY: 343 mg/dL — AB (ref 65–99)
Glucose-Capillary: 115 mg/dL — ABNORMAL HIGH (ref 65–99)
Glucose-Capillary: 224 mg/dL — ABNORMAL HIGH (ref 65–99)
Glucose-Capillary: 398 mg/dL — ABNORMAL HIGH (ref 65–99)

## 2016-03-23 LAB — MRSA PCR SCREENING: MRSA by PCR: NEGATIVE

## 2016-03-23 LAB — MAGNESIUM: Magnesium: 1.7 mg/dL (ref 1.7–2.4)

## 2016-03-23 MED ORDER — POTASSIUM CHLORIDE CRYS ER 20 MEQ PO TBCR: 40.0000 meq | EXTENDED_RELEASE_TABLET | Freq: Once | ORAL | Status: DC

## 2016-03-23 MED ORDER — HYDRALAZINE HCL 20 MG/ML IJ SOLN
5.0000 mg | Freq: Once | INTRAMUSCULAR | Status: AC
  Administered 2016-03-23: 5 mg via INTRAVENOUS
  Filled 2016-03-23: qty 1

## 2016-03-23 MED ORDER — INSULIN GLARGINE 100 UNIT/ML ~~LOC~~ SOLN
40.0000 [IU] | SUBCUTANEOUS | Status: DC
  Administered 2016-03-23: 22:00:00 40 [IU] via SUBCUTANEOUS
  Filled 2016-03-23 (×2): qty 0.4

## 2016-03-23 MED ORDER — INSULIN GLARGINE 100 UNIT/ML ~~LOC~~ SOLN
20.0000 [IU] | SUBCUTANEOUS | Status: DC
  Administered 2016-03-23: 20 [IU] via SUBCUTANEOUS
  Filled 2016-03-23: qty 0.2

## 2016-03-23 MED ORDER — INSULIN GLARGINE 100 UNIT/ML ~~LOC~~ SOLN
10.0000 [IU] | Freq: Once | SUBCUTANEOUS | Status: AC
  Administered 2016-03-23: 10 [IU] via SUBCUTANEOUS
  Filled 2016-03-23: qty 0.1

## 2016-03-23 MED ORDER — INSULIN ASPART 100 UNIT/ML ~~LOC~~ SOLN
0.0000 [IU] | Freq: Three times a day (TID) | SUBCUTANEOUS | Status: DC
  Administered 2016-03-23: 8 [IU] via SUBCUTANEOUS
  Administered 2016-03-23: 11 [IU] via SUBCUTANEOUS
  Administered 2016-03-23: 15 [IU] via SUBCUTANEOUS
  Administered 2016-03-24: 18:00:00 8 [IU] via SUBCUTANEOUS
  Administered 2016-03-24: 11 [IU] via SUBCUTANEOUS
  Administered 2016-03-24: 3 [IU] via SUBCUTANEOUS
  Administered 2016-03-25: 2 [IU] via SUBCUTANEOUS
  Administered 2016-03-25: 15 [IU] via SUBCUTANEOUS
  Administered 2016-03-26: 13:00:00 11 [IU] via SUBCUTANEOUS
  Filled 2016-03-23: qty 8
  Filled 2016-03-23: qty 15
  Filled 2016-03-23: qty 11
  Filled 2016-03-23: qty 2
  Filled 2016-03-23: qty 8
  Filled 2016-03-23: qty 3
  Filled 2016-03-23: qty 8
  Filled 2016-03-23 (×2): qty 11
  Filled 2016-03-23: qty 15

## 2016-03-23 MED ORDER — INSULIN GLARGINE 100 UNIT/ML ~~LOC~~ SOLN
35.0000 [IU] | SUBCUTANEOUS | Status: DC
  Filled 2016-03-23: qty 0.35

## 2016-03-23 MED ORDER — INSULIN ASPART 100 UNIT/ML ~~LOC~~ SOLN
0.0000 [IU] | Freq: Every day | SUBCUTANEOUS | Status: DC
  Administered 2016-03-23 – 2016-03-24 (×2): 4 [IU] via SUBCUTANEOUS
  Administered 2016-03-25: 21:00:00 3 [IU] via SUBCUTANEOUS
  Filled 2016-03-23: qty 3
  Filled 2016-03-23 (×2): qty 4

## 2016-03-23 MED ORDER — LABETALOL HCL 100 MG PO TABS
100.0000 mg | ORAL_TABLET | Freq: Two times a day (BID) | ORAL | Status: DC
  Administered 2016-03-23 – 2016-03-26 (×6): 100 mg via ORAL
  Filled 2016-03-23 (×7): qty 1

## 2016-03-23 MED ORDER — POTASSIUM PHOSPHATES 15 MMOLE/5ML IV SOLN: 30.0000 mmol | Freq: Once | INTRAVENOUS | Status: DC

## 2016-03-23 MED ORDER — HYDROCHLOROTHIAZIDE 12.5 MG PO CAPS
12.5000 mg | ORAL_CAPSULE | Freq: Every day | ORAL | Status: DC
  Administered 2016-03-23 – 2016-03-26 (×4): 12.5 mg via ORAL
  Filled 2016-03-23 (×5): qty 1

## 2016-03-23 MED ORDER — INSULIN ASPART 100 UNIT/ML ~~LOC~~ SOLN: 4.0000 [IU] | Freq: Three times a day (TID) | SUBCUTANEOUS | Status: DC

## 2016-03-23 NOTE — Progress Notes (Signed)
Transitioned pt off insulin drip at 0300. [facility]SS. [facility] Pt stated she does not take very good care of herself as far as adhering to diet recommendations. Notified Pyreddy about BP 156/77, no new orders received at this time.

## 2016-03-23 NOTE — Plan of Care (Signed)
Problem: Metabolic: Goal: Ability to maintain appropriate glucose levels will improve Outcome: Not Progressing MD aware and insulin order adjustments have been made  Problem: Physical Regulation: Goal: Will not experience complications related to bowel motility Outcome: Progressing Last BM 8/18 Goal: Pain level will decrease Outcome: Progressing Patient reporting no pain this shift

## 2016-03-23 NOTE — Progress Notes (Signed)
[facility] [place] [facility]   PATIENT NAME: Vicki [name]    MR#:  [id number]  DATE OF BIRTH:  11/11/1940  SUBJECTIVE:  CHIEF COMPLAINT:   Chief Complaint  Patient presents with  . Chest Pain   The patient is 75 year old African-American female with past history significant for history of diabetes mellitus, insulin-dependent, who presents to the hospital with complaints of nausea, vomiting, abdominal pain. On arrival to the hospital, she was noted to be severely hyperglycemic with a glucose level of 766 5 and anion gap of 17, she was admitted to the hospital for IV insulin and IV fluids. She improved, and she was changed to insulin Lantus, which is being advanced depending on her blood glucose levels. Patient feels better today overall, denies any pain Review of Systems  Constitutional: Negative for chills, fever and weight loss.  HENT: Negative for congestion.   Eyes: Negative for blurred vision and double vision.  Respiratory: Negative for cough, sputum production, shortness of breath and wheezing.   Cardiovascular: Negative for chest pain, palpitations, orthopnea, leg swelling and PND.  Gastrointestinal: Negative for abdominal pain, blood in stool, constipation, diarrhea, nausea and vomiting.  Genitourinary: Negative for dysuria, frequency, hematuria and urgency.  Musculoskeletal: Negative for falls.  Neurological: Negative for dizziness, tremors, focal weakness and headaches.  Endo/Heme/Allergies: Does not bruise/bleed easily.  Psychiatric/Behavioral: Negative for depression. The patient does not have insomnia.     VITAL SIGNS: Blood pressure (!) 146/77, pulse 75, temperature 98.5 F (36.9 C), temperature source Oral, resp. rate 17, height 5\' 5"  (1.651 m), weight 69.5 kg (153 lb 3.5 oz), SpO2 98 %.  PHYSICAL EXAMINATION:   GENERAL:  [age] y.o.-year-old patient lying in the bed with no acute distress.  EYES: Pupils equal, round, reactive to light and  accommodation. No scleral icterus. Extraocular muscles intact.  HEENT: Head atraumatic, normocephalic. Oropharynx and nasopharynx clear.  NECK:  Supple, no jugular venous distention. No thyroid enlargement, no tenderness.  LUNGS: Normal breath sounds bilaterally, no wheezing, rales,rhonchi or crepitation. No use of accessory muscles of respiration.  CARDIOVASCULAR: S1, S2 normal. No murmurs, rubs, or gallops.  ABDOMEN: Soft, nontender, nondistended. Bowel sounds present. No organomegaly or mass.  EXTREMITIES: No pedal edema, cyanosis, or clubbing.  NEUROLOGIC: Cranial nerves II through XII are intact. Muscle strength 5/5 in all extremities. Sensation intact. Gait not checked.  PSYCHIATRIC: The patient is alert and oriented x 3.  SKIN: No obvious rash, lesion, or ulcer.   ORDERS/RESULTS REVIEWED:   CBC  Recent Labs Lab 03/22/16 1315 03/22/16 1803 03/23/16 0226  WBC 9.5 8.0 9.2  HGB 13.0 12.4 11.9*  HCT 37.4 36.8 34.4*  PLT 255 265 249  MCV 92.7 91.6 90.3  MCH 32.2 30.9 31.4  MCHC 34.7 33.7 34.7  RDW 13.5 13.4 13.3   ------------------------------------------------------------------------------------------------------------------  Chemistries   Recent Labs Lab 03/22/16 1315 03/22/16 1803 03/23/16 0017 03/23/16 0226  NA 129* 135 137 135  K 4.7 3.8 3.4* 3.5  CL 95* 104 106 105  CO2 17* 22 25 24   GLUCOSE 765* 372* 101* 227*  BUN 35* 31* 26* 24*  CREATININE 1.12* 0.93 0.89 0.73  CALCIUM 9.4 8.9 8.9 8.7*  MG  --  1.7 1.7  --    ------------------------------------------------------------------------------------------------------------------ estimated creatinine clearance is 57.6 mL/min (by C-G formula based on SCr of 0.8 mg/dL). ------------------------------------------------------------------------------------------------------------------ No results for input(s): TSH, T4TOTAL, T3FREE, THYROIDAB in the last 72 hours.  Invalid input(s): FREET3  Cardiac  Enzymes  Recent  Labs Lab 03/22/16 1315  TROPONINI <0.03   ------------------------------------------------------------------------------------------------------------------ Invalid input(s): POCBNP ---------------------------------------------------------------------------------------------------------------  RADIOLOGY: Dg Chest 2 View  Result Date: 03/22/2016 CLINICAL DATA:  One week of substernal chest pain with increased symptoms today. Fatigue and weakness. No vomiting. History of hypertension and diabetes. Nonsmoker. EXAM: CHEST  2 VIEW COMPARISON:  PA and lateral chest x-ray dated September 25, 2015 FINDINGS: The lungs are mildly hyperinflated with mild hemidiaphragm flattening. There is no focal infiltrate. There is no pleural effusion. There is no pneumothorax or pneumomediastinum. The heart and pulmonary vascularity are normal. There is calcification in the wall of the thoracic aorta. The bony thorax exhibits no acute abnormality. IMPRESSION: Mild hyperinflation with hemidiaphragm flattening. This may be voluntary or could reflect reactive airway disease or COPD. There is no pneumonia, CHF, nor other acute cardiopulmonary abnormality. Aortic atherosclerosis. Electronically Signed   By: David  [place]azil[place]Jordan M.D.   On: 03/22/2016 13:58    EKG:  Orders placed or performed during the hospital encounter of 03/22/16  . EKG 12-Lead  . EKG 12-Lead  . ED EKG within 10 minutes  . ED EKG within 10 minutes    ASSESSMENT AND PLAN:  Active Problems:   DKA (diabetic ketoacidoses) (HCC) #1 DKA in diabetic patient, get hemoglobin A1c, continue insulin Lantus at 40 units subcutaneously at bedtime, advance it as needed, diabetic education, if possible over  weekend #2. Nausea, vomiting, resolved #3. Hypokalemia, resolved #4. Abdominal pain, resolved, continue patient on diabetic diet, follow clinically #4. Chest pain, resolved, questionable related to nausea and vomiting/gastroesophageal reflux  disease, patient may benefit from cardiac evaluation as outpatient, get echo  Management plans discussed with the patient, family and they are in agreement.   DRUG ALLERGIES: No Known Allergies  CODE STATUS:     Code Status Orders        Start     Ordered   03/22/16 1748  Do not attempt resuscitation (DNR)  Continuous    Question Answer Comment  In the event of cardiac or respiratory ARREST Do not call a "code blue"   In the event of cardiac or respiratory ARREST Do not perform Intubation, CPR, defibrillation or ACLS   In the event of cardiac or respiratory ARREST Use medication by any route, position, wound care, and other measures to relive pain and suffering. May use oxygen, suction and manual treatment of airway obstruction as needed for comfort.      03/22/16 1747    Code Status History    Date Active Date Inactive Code Status Order ID Comments User Context   09/27/2015 11:03 AM 09/28/2015  6:31 PM DNR [id number]  Lewie LoronMagadalene S Tukov, NP Inpatient   09/27/2015 10:51 AM 09/27/2015 10:51 AM Partial Code [id number]  Lewie LoronMagadalene S Tukov, NP Inpatient   09/25/2015 11:17 PM 09/27/2015 10:51 AM DNR [id number]  Arnaldo NatalMichael S Diamond, MD Inpatient   09/25/2015  9:38 PM 09/25/2015 11:17 PM Full Code [id number]  Arnaldo NatalMichael S Diamond, MD Inpatient      TOTAL TIME TAKING CARE OF THIS PATIENT: 40 minutes.    [name],[place] Nishi M.D on 03/23/2016 at 1:57 PM  Between 7am to 6pm - Pager - [phone]  After 6pm go to [email or web address] - password [email or web address] [facility]  [facility]  Office  [phone]  CC: Primary care physician; Mick SellFITZGERALD, DAVID P, MD

## 2016-03-23 NOTE — Progress Notes (Signed)
Patient is alert and oriented. Reporting sore throat that she has had x days, improved with tylenol. Covering Blood glucose with sliding scale and schedule insulin, adjusted dose today per Dr Winona LegatoVaickute. Vital signs stable. Afebrile. [facility]. Will transfer to new room assignment.

## 2016-03-23 NOTE — Progress Notes (Signed)
Patient transferred from CCU. Jaretzi Droz S, RN  

## 2016-03-24 ENCOUNTER — Inpatient Hospital Stay
Admit: 2016-03-24 | Discharge: 2016-03-24 | Disposition: A | Discharge status: Home / Self Care | Payer: Commercial Managed Care - HMO | Attending: Internal Medicine | Admitting: Internal Medicine

## 2016-03-24 DIAGNOSIS — R112 Nausea with vomiting, unspecified: Secondary | ICD-10-CM

## 2016-03-24 DIAGNOSIS — R1084 Generalized abdominal pain: Secondary | ICD-10-CM

## 2016-03-24 DIAGNOSIS — R079 Chest pain, unspecified: Secondary | ICD-10-CM

## 2016-03-24 DIAGNOSIS — E876 Hypokalemia: Secondary | ICD-10-CM

## 2016-03-24 LAB — ECHOCARDIOGRAM COMPLETE
Height: 65 in
Weight: 2451.52 oz

## 2016-03-24 LAB — BASIC METABOLIC PANEL
Anion gap: 7 (ref 5–15)
BUN: 18 mg/dL (ref 6–20)
CO2: 27 mmol/L (ref 22–32)
Calcium: 9.3 mg/dL (ref 8.9–10.3)
Chloride: 105 mmol/L (ref 101–111)
Creatinine, Ser: 0.72 mg/dL (ref 0.44–1.00)
GFR calc Af Amer: >60 mL/min (ref >60)
GFR calc non Af Amer: >60 mL/min (ref >60)
Glucose, Bld: 161 mg/dL — ABNORMAL HIGH (ref 65–99)
Potassium: 3.4 mmol/L — ABNORMAL LOW (ref 3.5–5.1)
Sodium: 139 mmol/L (ref 135–145)

## 2016-03-24 LAB — GLUCOSE, CAPILLARY
GLUCOSE-CAPILLARY: 164 mg/dL — AB (ref 65–99)
GLUCOSE-CAPILLARY: 301 mg/dL — AB (ref 65–99)
Glucose-Capillary: 282 mg/dL — ABNORMAL HIGH (ref 65–99)
Glucose-Capillary: 329 mg/dL — ABNORMAL HIGH (ref 65–99)
Glucose-Capillary: 274 mg/dL — ABNORMAL HIGH (ref 65–99)

## 2016-03-24 MED ORDER — POTASSIUM CHLORIDE CRYS ER 20 MEQ PO TBCR
40.0000 meq | EXTENDED_RELEASE_TABLET | Freq: Once | ORAL | Status: AC
  Administered 2016-03-24: 09:00:00 40 meq via ORAL
  Filled 2016-03-24: qty 2

## 2016-03-24 MED ORDER — INSULIN GLARGINE 300 UNIT/ML ~~LOC~~ SOPN: 50.0000 [IU] | PEN_INJECTOR | Freq: Every day | SUBCUTANEOUS | 0 refills | Status: DC

## 2016-03-24 MED ORDER — INSULIN GLARGINE 100 UNIT/ML ~~LOC~~ SOLN
50.0000 [IU] | SUBCUTANEOUS | Status: DC
  Administered 2016-03-24 – 2016-03-25 (×2): 50 [IU] via SUBCUTANEOUS
  Filled 2016-03-24 (×3): qty 0.5

## 2016-03-24 MED ORDER — INSULIN GLARGINE 100 UNIT/ML ~~LOC~~ SOLN
10.0000 [IU] | Freq: Every day | SUBCUTANEOUS | Status: DC
  Administered 2016-03-24: 10 [IU] via SUBCUTANEOUS
  Filled 2016-03-24: qty 0.1

## 2016-03-24 NOTE — Care Management Note (Signed)
Case Management Note  Patient Details  Name: Vicki [name] MRN: [id number] Date of Birth: Aug 07, 1940  Subjective/Objective:  Discussed Discharge planning with Ms Vicki [name] who chose [facility] to be her provider. [name] referral was faxed to [facility] requesting home health PT and RN services.                   Action/Plan:   Expected Discharge Date:                  Expected Discharge Plan:     In-House Referral:     Discharge planning Services     Post Acute Care Choice:    Choice offered to:     DME Arranged:    DME Agency:     HH Arranged:    HH Agency:     Status of Service:     If discussed at [employer] Length of Stay Meetings, dates discussed:    Additional Comments:  Vicki Keepers A, RN 03/24/2016, 11:09 AM

## 2016-03-24 NOTE — Discharge Summary (Signed)
[facility] - [place] at [facility]   PATIENT NAME: Vicki [name]    MR#:  [id number]  DATE OF BIRTH:  [birth year]  DATE OF ADMISSION:  03/22/2016 ADMITTING PHYSICIAN: Shaune Pollack, MD  DATE OF DISCHARGE: No discharge date for patient encounter.  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 Diabetes mellitus without complication (HCC)   . GERD (gastroesophageal reflux disease)   . Hypertension     .pro HOSPITAL COURSE:  The patient is 75 year old African-American female with past history significant for history of diabetes mellitus, insulin-dependent, who presented to the hospital with complaints of nausea, vomiting, abdominal pain. On arrival to the hospital, she was noted to be hyperglycemic with a glucose level of 765  and anion gap of 17, she was also noted to have acute renal insufficiency with creatinine of 1.12, she was admitted to the hospital for IV insulin and IV fluids. She improved clinically, and she was changed to insulin Lantus, which was advanced depending on her blood glucose levels. He'll hemoglobin A1c was checked and it was found to be elevated at 12.7, worse than in the past. Patient was advised to advance her long-acting insulin to 50 units subcutaneously, checking her blood glucose levels in the morning closely, she was also recommended to follow-up with endocrinologist, Dr. Tedd Sias. Patient may benefit from a lifestyle evaluation and follow-up as outpatient. Meanwhile, to help her to understand insulin administration, we are arranging for her home health services upon  discharge. Discussion by problem: #1 DKA in diabetic patient, hemoglobin A1c was 12.7, worsened in the past,, advance insulin Tuijeo to 50 units subcutaneously at bedtime, advance it as needed as outpatient, diabetic education will be provided for patient at home with home health services RN,  she is to continue follow-up with her endocrinologist, she may benefit from lifestyle center follow-up as outpatient as well .  #2. Nausea, vomiting, resolved, likely DKA related oral intake was good on the day of discharge, patient consumed 100% of offered meals. #3. Hypokalemia, resolved #4. Abdominal pain, resolved, continue patient on diabetic diet.  #4. Chest pain, resolved, questionable related to nausea and vomiting/gastroesophageal reflux disease, patient may benefit from cardiac evaluation as outpatient, get echo DISCHARGE CONDITIONS:   stable  CONSULTS OBTAINED:    DRUG ALLERGIES:  No Known Allergies  DISCHARGE MEDICATIONS:   Current Discharge Medication List    CONTINUE these medications which have CHANGED   Details  Insulin Glargine (TOUJEO SOLOSTAR) 300 UNIT/ML SOPN Inject 50 Units into the skin at bedtime. Qty: 1 pen, Refills: 0      CONTINUE these medications which have NOT CHANGED   Details  acetaminophen (TYLENOL) 500 MG tablet Take 1,000-1,500 mg by mouth every 6 (six) hours as needed for mild pain or headache.    brimonidine-timolol (COMBIGAN) 0.2-0.5 % ophthalmic solution Place 1 drop into both eyes daily.    citalopram (CELEXA) 20 MG tablet Take 20 mg by mouth daily.    insulin lispro (HUMALOG) 100 UNIT/ML injection Inject 2-10 Units into the skin 3 (three) times daily before meals. Pt uses per sliding scale.    labetalol (NORMODYNE)  100 MG tablet Take 100 mg by mouth 2 (two) times daily.    lidocaine (LIDODERM) 5 % Place 1 patch onto the skin every 12 (twelve) hours. Remove & Discard patch within 12 hours or as directed by MD Qty: 10 patch, Refills: 0     loratadine (CLARITIN) 10 MG tablet Take 10 mg by mouth daily.    lovastatin (MEVACOR) 20 MG tablet Take 20 mg by mouth at bedtime.    magnesium oxide (MAG-OX) 400 MG tablet Take 400 mg by mouth daily.    omeprazole (PRILOSEC) 20 MG capsule Take 1 capsule (20 mg total) by mouth daily. Qty: 30 capsule, Refills: 0    polyethylene glycol (MIRALAX / GLYCOLAX) packet Take 17 g by mouth daily. Qty: 14 each, Refills: 0    quinapril (ACCUPRIL) 20 MG tablet Take 20 mg by mouth daily.      STOP taking these medications     hydrochlorothiazide (MICROZIDE) 12.5 MG capsule      potassium chloride (K-DUR,KLOR-CON) 10 MEQ tablet          DISCHARGE INSTRUCTIONS:    Stable  If you experience worsening of your admission symptoms, develop shortness of breath, life threatening emergency, suicidal or homicidal thoughts you must seek medical attention immediately by calling 911 or calling your MD immediately  if symptoms less severe.  You Must read complete instructions/literature along with all the possible adverse reactions/side effects for all the Medicines you take and that have been prescribed to you. Take any new Medicines after you have completely understood and accept all the possible adverse reactions/side effects.   Please note  You were cared for by a hospitalist during your hospital stay. If you have any questions about your discharge medications or the care you received while you were in the hospital after you are discharged, you can call the unit and asked to speak with the hospitalist on call if the hospitalist that took care of you is not available. Once you are discharged, your primary care physician will handle any further medical issues. Please note that NO REFILLS for any discharge medications will be authorized once you are discharged, as it is imperative that you return to your primary care physician (or establish a relationship with a primary care physician if you do not have  one) for your aftercare needs so that they can reassess your need for medications and monitor your lab values.    Today   CHIEF COMPLAINT:   Chief Complaint  Patient presents with  . Chest Pain    HISTORY OF PRESENT ILLNESS:  Vicki [name]  is a 75 y.o. female with a known history of diabetes mellitus, insulin-dependent, who presented to the hospital with complaints of nausea, vomiting, abdominal pain. On arrival to the hospital, she was noted to be hyperglycemic with a glucose level of 765  and anion gap of 17, she was also noted to have acute renal insufficiency with creatinine of 1.12, she was admitted to the hospital for IV insulin and IV fluids. She improved clinically, and she was changed to insulin Lantus, which was advanced depending on her blood glucose levels. He'll hemoglobin A1c was checked and it was found to be elevated at 12.7, worse than in the past. Patient was advised to advance her long-acting insulin to 50 units subcutaneously, checking her blood glucose levels in the morning closely, she was also recommended to follow-up with endocrinologist, Dr. Tedd SiasSolum. Patient may benefit from a lifestyle evaluation and follow-up as outpatient. Meanwhile,  to help her to understand insulin administration, we are arranging for her home health services upon discharge. Discussion by problem: #1 DKA in diabetic patient, hemoglobin A1c was 12.7, worsened in the past,, advance insulin Tuijeo to 50 units subcutaneously at bedtime, advance it as needed as outpatient, diabetic education will be provided for patient at home with home health services RN,  she is to continue follow-up with her endocrinologist, she may benefit from lifestyle center follow-up as outpatient as well .  #2. Nausea, vomiting, resolved, likely DKA related oral intake was good on the day of discharge, patient consumed 100% of offered meals. #3. Hypokalemia, resolved #4. Abdominal pain, resolved, continue patient on diabetic diet.   #4. Chest pain, resolved, questionable related to nausea and vomiting/gastroesophageal reflux disease, patient may benefit from cardiac evaluation as outpatient, get echo    VITAL SIGNS:  Blood pressure (!) 162/68, pulse 66, temperature 98.5 F (36.9 C), temperature source Oral, resp. rate 18, height 5\' 5"  (1.651 m), weight 69.5 kg (153 lb 3.5 oz), SpO2 100 %.  I/O:   Intake/Output Summary (Last 24 hours) at 03/24/16 1420 Last data filed at 03/24/16 1001  Gross per 24 hour  Intake              600 ml  Output                0 ml  Net              600 ml    PHYSICAL EXAMINATION:  GENERAL:  75 y.o.-year-old patient lying in the bed with no acute distress.  EYES: Pupils equal, round, reactive to light and accommodation. No scleral icterus. Extraocular muscles intact.  HEENT: Head atraumatic, normocephalic. Oropharynx and nasopharynx clear.  NECK:  Supple, no jugular venous distention. No thyroid enlargement, no tenderness.  LUNGS: Normal breath sounds bilaterally, no wheezing, rales,rhonchi or crepitation. No use of accessory muscles of respiration.  CARDIOVASCULAR: S1, S2 normal. No murmurs, rubs, or gallops.  ABDOMEN: Soft, non-tender, non-distended. Bowel sounds present. No organomegaly or mass.  EXTREMITIES: No pedal edema, cyanosis, or clubbing.  NEUROLOGIC: Cranial nerves II through XII are intact. Muscle strength 5/5 in all extremities. Sensation intact. Gait not checked.  PSYCHIATRIC: The patient is alert and oriented x 3.  SKIN: No obvious rash, lesion, or ulcer.   DATA REVIEW:   CBC  Recent Labs Lab 03/23/16 0226  WBC 9.2  HGB 11.9*  HCT 34.4*  PLT 249    Chemistries   Recent Labs Lab 03/23/16 0017  03/24/16 0521  NA 137  < > 139  K 3.4*  < > 3.4*  CL 106  < > 105  CO2 25  < > 27  GLUCOSE 101*  < > 161*  BUN 26*  < > 18  CREATININE 0.89  < > 0.72  CALCIUM 8.9  < > 9.3  MG 1.7  --   --   < > = values in this interval not displayed.  Cardiac  Enzymes  Recent Labs Lab 03/22/16 1315  TROPONINI <0.03    Microbiology Results  Results for orders placed or performed during the hospital encounter of 03/22/16  MRSA PCR Screening     Status: None   Collection Time: 03/23/16  7:50 PM  Result Value Ref Range Status   MRSA by PCR NEGATIVE NEGATIVE Final    Comment:        The GeneXpert MRSA Assay (FDA approved for NASAL specimens only), is one component  of a comprehensive MRSA colonization surveillance program. It is not intended to diagnose MRSA infection nor to guide or monitor treatment for MRSA infections.     RADIOLOGY:  No results found.  EKG:   Orders placed or performed during the hospital encounter of 03/22/16  . EKG 12-Lead  . EKG 12-Lead  . ED EKG within 10 minutes  . ED EKG within 10 minutes      Management plans discussed with the patient, family and they are in agreement.  CODE STATUS:     Code Status Orders        Start     Ordered   03/22/16 1748  Do not attempt resuscitation (DNR)  Continuous    Question Answer Comment  In the event of cardiac or respiratory ARREST Do not call a "code blue"   In the event of cardiac or respiratory ARREST Do not perform Intubation, CPR, defibrillation or ACLS   In the event of cardiac or respiratory ARREST Use medication by any route, position, wound care, and other measures to relive pain and suffering. May use oxygen, suction and manual treatment of airway obstruction as needed for comfort.      03/22/16 1747    Code Status History    Date Active Date Inactive Code Status Order ID Comments User Context   03/22/2016  5:47 PM 03/24/2016  6:00 AM DNR [id number]  Shaune PollackQing Chen, MD Inpatient   09/27/2015 11:03 AM 09/28/2015  6:31 PM DNR [id number]  Lewie LoronMagadalene S Tukov, NP Inpatient   09/27/2015 10:51 AM 09/27/2015 10:51 AM Partial Code [id number]  Lewie LoronMagadalene S Tukov, NP Inpatient   09/25/2015 11:17 PM 09/27/2015 10:51 AM DNR [id number]  Arnaldo NatalMichael S Diamond, MD Inpatient    09/25/2015  9:38 PM 09/25/2015 11:17 PM Full Code [id number]  Arnaldo NatalMichael S Diamond, MD Inpatient    Questions for Most Recent Historical Code Status (Order [id number])    Question Answer Comment   In the event of cardiac or respiratory ARREST Do not call a "code blue"    In the event of cardiac or respiratory ARREST Do not perform Intubation, CPR, defibrillation or ACLS    In the event of cardiac or respiratory ARREST Use medication by any route, position, wound care, and other measures to relive pain and suffering. May use oxygen, suction and manual treatment of airway obstruction as needed for comfort.       TOTAL TIME TAKING CARE OF THIS PATIENT: 40 minutes.    [name],Cason Luffman M.D on 03/24/2016 at 2:20 PM  Between 7am to 6pm - Pager - [phone]  After 6pm go to [email or web address] - password [email or web address] [email or web address]  [facility]  Office  [phone]  CC: Primary care physician; Mick SellFITZGERALD, DAVID P, MD

## 2016-03-25 ENCOUNTER — Inpatient Hospital Stay: Payer: Commercial Managed Care - HMO

## 2016-03-25 DIAGNOSIS — E860 Dehydration: Secondary | ICD-10-CM | POA: Diagnosis not present

## 2016-03-25 DIAGNOSIS — Z23 Encounter for immunization: Secondary | ICD-10-CM | POA: Diagnosis not present

## 2016-03-25 DIAGNOSIS — I10 Essential (primary) hypertension: Secondary | ICD-10-CM | POA: Diagnosis not present

## 2016-03-25 DIAGNOSIS — K219 Gastro-esophageal reflux disease without esophagitis: Secondary | ICD-10-CM | POA: Diagnosis not present

## 2016-03-25 DIAGNOSIS — R1084 Generalized abdominal pain: Secondary | ICD-10-CM | POA: Diagnosis not present

## 2016-03-25 DIAGNOSIS — T383X6A Underdosing of insulin and oral hypoglycemic [antidiabetic] drugs, initial encounter: Secondary | ICD-10-CM | POA: Diagnosis not present

## 2016-03-25 DIAGNOSIS — Z91120 Patient's intentional underdosing of medication regimen due to financial hardship: Secondary | ICD-10-CM | POA: Diagnosis not present

## 2016-03-25 DIAGNOSIS — E1310 Other specified diabetes mellitus with ketoacidosis without coma: Secondary | ICD-10-CM | POA: Diagnosis not present

## 2016-03-25 DIAGNOSIS — E876 Hypokalemia: Secondary | ICD-10-CM | POA: Diagnosis not present

## 2016-03-25 LAB — BLOOD GAS, VENOUS
Acid-base deficit: 9.2 mmol/L — ABNORMAL HIGH (ref 0.0–2.0)
Bicarbonate: 19.4 mEq/L — ABNORMAL LOW (ref 21.0–28.0)
PATIENT TEMPERATURE: 37
pCO2, Ven: 52 mmHg (ref 44.0–60.0)
pH, Ven: 7.18 — CL (ref 7.320–7.430)

## 2016-03-25 LAB — BASIC METABOLIC PANEL WITH GFR
Anion gap: 7 (ref 5–15)
BUN: 18 mg/dL (ref 6–20)
CO2: 25 mmol/L (ref 22–32)
Calcium: 9 mg/dL (ref 8.9–10.3)
Chloride: 106 mmol/L (ref 101–111)
Creatinine, Ser: 0.7 mg/dL (ref 0.44–1.00)
GFR calc Af Amer: >60 mL/min
GFR calc non Af Amer: >60 mL/min
Glucose, Bld: 166 mg/dL — ABNORMAL HIGH (ref 65–99)
Potassium: 3.9 mmol/L (ref 3.5–5.1)
Sodium: 138 mmol/L (ref 135–145)

## 2016-03-25 LAB — TROPONIN I: Troponin I: <0.03 ng/mL

## 2016-03-25 LAB — GLUCOSE, CAPILLARY
GLUCOSE-CAPILLARY: 125 mg/dL — AB (ref 65–99)
Glucose-Capillary: 68 mg/dL (ref 65–99)
Glucose-Capillary: 68 mg/dL (ref 65–99)
Glucose-Capillary: 361 mg/dL — ABNORMAL HIGH (ref 65–99)
Glucose-Capillary: 239 mg/dL — ABNORMAL HIGH (ref 65–99)

## 2016-03-25 MED ORDER — ATORVASTATIN CALCIUM 20 MG PO TABS: 40.0000 mg | ORAL_TABLET | Freq: Every day | ORAL | Status: DC

## 2016-03-25 MED ORDER — ISOSORBIDE MONONITRATE ER 30 MG PO TB24
30.0000 mg | ORAL_TABLET | Freq: Every day | ORAL | Status: DC
  Administered 2016-03-25 – 2016-03-26 (×2): 30 mg via ORAL
  Filled 2016-03-25 (×2): qty 1

## 2016-03-25 MED ORDER — REGADENOSON 0.4 MG/5ML IV SOLN
0.4000 mg | Freq: Once | INTRAVENOUS | Status: AC
  Administered 2016-03-25: 0.4 mg via INTRAVENOUS

## 2016-03-25 MED ORDER — TECHNETIUM TC 99M TETROFOSMIN IV KIT
30.0000 | PACK | Freq: Once | INTRAVENOUS | Status: AC | PRN
  Administered 2016-03-25: 32.045 via INTRAVENOUS

## 2016-03-25 MED ORDER — ASPIRIN EC 81 MG PO TBEC
81.0000 mg | DELAYED_RELEASE_TABLET | Freq: Every day | ORAL | Status: DC
  Administered 2016-03-25 – 2016-03-26 (×2): 81 mg via ORAL
  Filled 2016-03-25 (×2): qty 1

## 2016-03-25 MED ORDER — TECHNETIUM TC 99M TETROFOSMIN IV KIT
13.0300 | PACK | Freq: Once | INTRAVENOUS | Status: AC | PRN
  Administered 2016-03-25: 11:00:00 13.03 via INTRAVENOUS

## 2016-03-25 NOTE — Care Management (Signed)
Discharge to home today per Dr. Winona LegatoVaickute. Will be followed by [facility]. Gwenette GreetBrenda S Ethan Clayburn RN MSN CCM Care Management [phone]

## 2016-03-25 NOTE — Progress Notes (Signed)
Notified Dr Winona LegatoVaickute of CBG 68. OJ brought to pt but she requests sprite, crackers and peanut butter. CBG rechecked after 15min and is still 68. Pt is ready to eat her lunch. Sliding scale insulin held. No further orders at this time.

## 2016-03-25 NOTE — Progress Notes (Signed)
[facility] - [place] [facility]   PATIENT NAME: Vicki JewettRoberta Cunning    MR#:  [id number]  DATE OF BIRTH:  04/09/1941  SUBJECTIVE:  CHIEF COMPLAINT:   Chief Complaint  Patient presents with  . Chest Pain   The patient is 75 year old African-American female with past history significant for history of diabetes mellitus, insulin-dependent, who presents to the hospital with complaints of nausea, vomiting, abdominal pain. On arrival to the hospital, she was noted to be severely hyperglycemic with a glucose level of 766 5 and anion gap of 17, she was admitted to the hospital for IV insulin and IV fluids. She improved, and she was changed to insulin Lantus, which was advanced depending on her blood glucose levels. Patient feels good today , denies any pain, admits of exertional angina. Stress test today was done, results are pending, not reported by cardiologist on call.    Review of Systems  Constitutional: Negative for chills, fever and weight loss.  HENT: Negative for congestion.   Eyes: Negative for blurred vision and double vision.  Respiratory: Negative for cough, sputum production, shortness of breath and wheezing.   Cardiovascular: Negative for chest pain, palpitations, orthopnea, leg swelling and PND.  Gastrointestinal: Negative for abdominal pain, blood in stool, constipation, diarrhea, nausea and vomiting.  Genitourinary: Negative for dysuria, frequency, hematuria and urgency.  Musculoskeletal: Negative for falls.  Neurological: Negative for dizziness, tremors, focal weakness and headaches.  Endo/Heme/Allergies: Does not bruise/bleed easily.  Psychiatric/Behavioral: Negative for depression. The patient does not have insomnia.     VITAL SIGNS: Blood pressure (!) 144/64, pulse 70, temperature 98.5 F (36.9 C), temperature source Oral, resp. rate 18, height 5\' 5"  (1.651 m), weight 69.5 kg (153 lb 3.5 oz), SpO2 100 %.  PHYSICAL EXAMINATION:   GENERAL:  75  y.o.-year-old patient lying in the bed with no acute distress.  EYES: Pupils equal, round, reactive to light and accommodation. No scleral icterus. Extraocular muscles intact.  HEENT: Head atraumatic, normocephalic. Oropharynx and nasopharynx clear.  NECK:  Supple, no jugular venous distention. No thyroid enlargement, no tenderness.  LUNGS: Normal breath sounds bilaterally, no wheezing, rales,rhonchi or crepitation. No use of accessory muscles of respiration.  CARDIOVASCULAR: S1, S2 normal. No murmurs, rubs, or gallops.  ABDOMEN: Soft, nontender, nondistended. Bowel sounds present. No organomegaly or mass.  EXTREMITIES: No pedal edema, cyanosis, or clubbing.  NEUROLOGIC: Cranial nerves II through XII are intact. Muscle strength 5/5 in all extremities. Sensation intact. Gait not checked.  PSYCHIATRIC: The patient is alert and oriented x 3.  SKIN: No obvious rash, lesion, or ulcer.   ORDERS/RESULTS REVIEWED:   CBC  Recent Labs Lab 03/22/16 1315 03/22/16 1803 03/23/16 0226  WBC 9.5 8.0 9.2  HGB 13.0 12.4 11.9*  HCT 37.4 36.8 34.4*  PLT 255 265 249  MCV 92.7 91.6 90.3  MCH 32.2 30.9 31.4  MCHC 34.7 33.7 34.7  RDW 13.5 13.4 13.3   ------------------------------------------------------------------------------------------------------------------  Chemistries   Recent Labs Lab 03/22/16 1803 03/23/16 0017 03/23/16 0226 03/24/16 0521 03/25/16 0518  NA 135 137 135 139 138  K 3.8 3.4* 3.5 3.4* 3.9  CL 104 106 105 105 106  CO2 22 25 24 27 25   GLUCOSE 372* 101* 227* 161* 166*  BUN 31* 26* 24* 18 18  CREATININE 0.93 0.89 0.73 0.72 0.70  CALCIUM 8.9 8.9 8.7* 9.3 9.0  MG 1.7 1.7  --   --   --    ------------------------------------------------------------------------------------------------------------------ estimated creatinine clearance is 57.6 mL/min (  by C-G formula based on SCr of 0.8  mg/dL). ------------------------------------------------------------------------------------------------------------------ No results for input(s): TSH, T4TOTAL, T3FREE, THYROIDAB in the last 72 hours.  Invalid input(s): FREET3  Cardiac Enzymes  Recent Labs Lab 03/22/16 1315 03/25/16 0840  TROPONINI <0.03 <0.03   ------------------------------------------------------------------------------------------------------------------ Invalid input(s): POCBNP ---------------------------------------------------------------------------------------------------------------  RADIOLOGY: No results found.  EKG:  Orders placed or performed during the hospital encounter of 03/22/16  . EKG 12-Lead  . EKG 12-Lead  . ED EKG within 10 minutes  . ED EKG within 10 minutes    ASSESSMENT AND PLAN:  Principal Problem:   DKA (diabetic ketoacidoses) (HCC) Active Problems:   Nausea and vomiting   Diffuse abdominal pain   Chest pain   Hypokalemia #1 DKA in diabetic patient, hemoglobin A1c was 12.7, continue insulin Lantus at 50 units subcutaneously at bedtime, advance Toujeo  as needed as outpatient, diabetic education is requested.  #2. Nausea, vomiting, resolved #3. Hypokalemia, resolved #4. Abdominal pain, resolved, continue patient on diabetic diet, stable clinically #4. Chest pain, concerning for stable angina, Echo showed wall motion abnormalities, Myoview is pending, get cardiologist to see patient as I feel she would benefit from cardiac catheterization, continue asa, labetalol, add imdur, lipitor.   Management plans discussed with the patient, family and they are in agreement.   DRUG ALLERGIES: No Known Allergies  CODE STATUS:     Code Status Orders        Start     Ordered   03/22/16 1748  Do not attempt resuscitation (DNR)  Continuous    Question Answer Comment  In the event of cardiac or respiratory ARREST Do not call a "code blue"   In the event of cardiac or respiratory  ARREST Do not perform Intubation, CPR, defibrillation or ACLS   In the event of cardiac or respiratory ARREST Use medication by any route, position, wound care, and other measures to relive pain and suffering. May use oxygen, suction and manual treatment of airway obstruction as needed for comfort.      03/22/16 1747    Code Status History    Date Active Date Inactive Code Status Order ID Comments User Context   09/27/2015 11:03 AM 09/28/2015  6:31 PM DNR [id number]  Lewie LoronMagadalene S Tukov, NP Inpatient   09/27/2015 10:51 AM 09/27/2015 10:51 AM Partial Code [id number]  Lewie LoronMagadalene S Tukov, NP Inpatient   09/25/2015 11:17 PM 09/27/2015 10:51 AM DNR [id number]  Arnaldo NatalMichael S Diamond, MD Inpatient   09/25/2015  9:38 PM 09/25/2015 11:17 PM Full Code [id number]  Arnaldo NatalMichael S Diamond, MD Inpatient      TOTAL TIME TAKING CARE OF THIS PATIENT: 40 minutes.    [name],Kashlyn Salinas M.D on 03/25/2016 at 7:11 PM  Between 7am to 6pm - Pager - [phone]  After 6pm go to [email or web address] - password [email or web address] [email or web address]  [facility]  Office  [phone]  CC: Primary care physician; Mick SellFITZGERALD, DAVID P, MD

## 2016-03-25 NOTE — Progress Notes (Addendum)
Inpatient Diabetes Program Recommendations  AACE/ADA: New Consensus Statement on Inpatient Glycemic Control (2015)  Target Ranges:  Prepandial:   less than 140 mg/dL      Peak postprandial:   less than 180 mg/dL (1-2 hours)      Critically ill patients:  140 - 180 mg/dL   Results for Vicki [name], Vicki [name] (MRN [id number]) as of 03/25/2016 14:07  Ref. Range 03/24/2016 07:55 03/24/2016 11:24 03/24/2016 13:03 03/24/2016 16:24 03/24/2016 21:07 03/25/2016 07:30 03/25/2016 13:31 03/25/2016 14:05  Glucose-Capillary Latest Ref Range: 65 - 99 mg/dL 409164 (H) 811282 (H) 914329 (H) 274 (H) 301 (H) 125 (H) 68 68  Results for Vicki [name], Vicki [name] (MRN [id number]) as of 03/25/2016 14:07  Ref. Range 03/23/2016 02:26  Hemoglobin A1C Latest Ref Range: 4.0 - 6.0 % 12.7 (H)   Review of Glycemic Control  Outpatient Diabetes medications: Toujeo 40 units QHS, Novolog 0-14 units TID with meals (based on correction scale) Current orders for Inpatient glycemic control: Lantus 50 units QHS, Novolog 0-15 units TID with meals, Novolog 0-5 units QHS  Inpatient Diabetes Program Recommendations:   Insulin - Basal: In reviewing chart, noted patient received Lantus 10 units at 8:20 am on 03/24/16 and Lantus 50 units at 21:22 pm on 03/24/16. Therefore, patient received a total of Lantus 60 units on 03/24/16. Fasting glucose 125 mg/dl this morning and glucose 68 mg/dl at 08:6513:31 today. Patient is currently ordered Lantus 50 units QHS. Do not recommend any changes with Lantus at this time. HgbA1C: A1C 12.7% on 03/23/16 indicating an average glucose of 318 mg/dl over the past 2-3 months. Encouraged patient to follow up with Dr. Tedd SiasSolum.  NOTE: Spoke with patient about diabetes and home regimen for diabetes control. Patient reports that he is followed by Dr. Tedd SiasSolum for diabetes management and currently she takes Toujeo 40 units QHS and Novolog 0-14 units TID with meals (based on correction scale) as an outpatient for diabetes control. Patient reports that she has  both Toujeo and Novolog at home.  Patient states that she checks her glucose 1-3 times per day but admits that she has not been checking her glucose as often as Dr. Tedd SiasSolum has asked her to do.  Patient reports that her glucose had been reading "HI" at home on this past Thursday and Friday. Patient states that she does not have many glucometer test strips at home but she plans on getting more when she is discharged (will go by her pharmacy). Inquired about prior A1C and patient reports that she does not recall her exact value of last A1C value but reports that her A1C was high. Discussed A1C results (12.7% on 03/23/16) and explained that her current A1C indicates an average glucose of 318 mg/dl over the past 2-3 months. Discussed glucose and A1C goals. Discussed importance of checking CBGs and maintaining good CBG control to prevent long-term and short-term complications. Explained how hyperglycemia leads to damage within blood vessels which lead to the common complications seen with uncontrolled diabetes. Stressed to the patient the importance of improving glycemic control to prevent further complications from uncontrolled diabetes. Discussed impact of nutrition, exercise, stress, sickness, and medications on diabetes control. Patient admits that she is eating a lot of foods high in carbohyrdates. Discussed carbohydrates, carbohydrate goals per day and meal, along with portion sizes. Encouraged patient to check her glucose 3-4 times per day (before meals and at bedtime) and to keep a log book of glucose readings and insulin taken which she will need to take to doctor  appointments. Encouraged patient to make an appointment with Dr. Tedd SiasSolum for follow up.  Explained how Dr. Tedd SiasSolum can use the log book to continue to make insulin adjustments if needed. Patient verbalized understanding of information discussed and she states that she has no further questions at this time related to diabetes.  Of note, during conversation  with patient her glucose was checked and noted to be 68 mg/dl. Patient had not had lunch yet and when she called to request tray she was told it would be 30-45 minutes before receiving lunch tray. Patient requested ginger ale so provided patient with regular ginger ale, pack of graham crackers and peanut butter. Patient states that she feels fine and denies any symptoms of hypoglycemia. RN aware of hypoglycemia and will follow up per protocol.  Thanks, Orlando PennerMarie Chanice Brenton, RN, MSN, CDE Diabetes Coordinator Inpatient Diabetes Program [phone] (Team Pager) [phone] ([facility] office) [phone] [facility] office) [phone] [facility] office)

## 2016-03-25 NOTE — Care Management Important Message (Signed)
Important Message  Patient Details  Name: Vicki [name] MRN: [id number] Date of Birth: September 04, 1940   Medicare Important Message Given:  Yes    Gwenette GreetBrenda S Gianpaolo Mindel, RN 03/25/2016, 12:19 PM

## 2016-03-26 ENCOUNTER — Other Ambulatory Visit: Payer: Self-pay | Admitting: *Deleted

## 2016-03-26 LAB — NM MYOCAR MULTI W/SPECT W/WALL MOTION / EF
CHL CUP NUCLEAR SDS: 0
CHL CUP NUCLEAR SSS: 11
CHL CUP RESTING HR STRESS: 72 {beats}/min
CSEPED: 1 min
CSEPEW: 1 METS
CSEPPHR: 109 {beats}/min
LV sys vol: 26 mL
LVDIAVOL: 51 mL (ref 46–106)
SRS: 14
TID: 1.17

## 2016-03-26 LAB — GLUCOSE, CAPILLARY
GLUCOSE-CAPILLARY: 52 mg/dL — AB (ref 65–99)
GLUCOSE-CAPILLARY: 97 mg/dL (ref 65–99)
GLUCOSE-CAPILLARY: 322 mg/dL — AB (ref 65–99)

## 2016-03-26 MED ORDER — ISOSORBIDE MONONITRATE ER 30 MG PO TB24: 30.0000 mg | ORAL_TABLET | Freq: Every day | ORAL | 6 refills | Status: DC

## 2016-03-26 MED ORDER — INSULIN LISPRO 100 UNIT/ML ~~LOC~~ SOLN: 7.0000 [IU] | Freq: Three times a day (TID) | SUBCUTANEOUS | 11 refills | Status: DC

## 2016-03-26 MED ORDER — ASPIRIN 81 MG PO TBEC: 81.0000 mg | DELAYED_RELEASE_TABLET | Freq: Every day | ORAL | 6 refills | Status: AC

## 2016-03-26 MED ORDER — INSULIN GLARGINE 300 UNIT/ML ~~LOC~~ SOPN: 40.0000 [IU] | PEN_INJECTOR | Freq: Every day | SUBCUTANEOUS | 0 refills | Status: DC

## 2016-03-26 NOTE — Plan of Care (Signed)
Problem: Health Behavior/Discharge Planning: Goal: Ability to manage health-related needs will improve Outcome: Progressing Pt able to give self insulin injections and knowledgeable about rotating sites for injection.

## 2016-03-26 NOTE — Progress Notes (Signed)
Inpatient Diabetes Program Recommendations  AACE/ADA: New Consensus Statement on Inpatient Glycemic Control (2015)  Target Ranges:  Prepandial:   less than 140 mg/dL      Peak postprandial:   less than 180 mg/dL (1-2 hours)      Critically ill patients:  140 - 180 mg/dL   Lab Results  Component Value Date   GLUCAP 322 (H) 03/26/2016   HGBA1C 12.7 (H) 03/23/2016    Spoke to the patient at the bedside- she admits to missing Novolog insulin at meals sometimes.  She tells me she's taking Toujeo 40 units qhs, and Novolog 2-10 units sliding scale at meals.  When I addressed with her, the orders in Dr. Pricilla HandlerSolum's notes, she admitted that she did not understand the instructions.    I have put [facility] with her transition of care, the best number to reach her at is [phone]  Recommend for discharge;  Toujeo 40 units qhs  Novolog   7 units tid with meals  Plus 1 unit for  CBG 150-200mg /dl Plus 2 units for CBG 098-$JXBJYNWGNFAOZHYQ_MVHQIONGEXBMWUXLKGMWNUUVOZDGUYQI$$HKVQQVZDGLOVFIEP_PIRJJOACZYSAYTKZSWFUXNATFTDDUKGU$201-250mg /dl Plus 3 units for CBG 542-$HCWCBJSEGBTDVVOH_YWVPXTGGYIRSWNIOEVOJJKKXFGHWEXHB$$ZJIRCVELFYBOFBPZ_WCHENIDPOEUMPNTIRWERXVQMGQQPYPPJ$251-300mg /dl Plus 4 units for CBG 093-$OIZTIWPYKDXIPJAS_NKNLZJQBHALPFXTKWIOXBDZHGDJMEQAS$$TMHDQQIWLNLGXQJJ_HERDEYCXKGYJEHUDJSHFWYOVZCHYIFOY$301-350mg /dl Plus 5 units for CBG >351mg /dl  Susette RacerJulie Janes Colegrove, RN, BA, [place], CDE Diabetes Coordinator Inpatient Diabetes Program  [phone] (Team Pager) [phone] [facility] Office) 03/26/2016 1:22 PM

## 2016-03-26 NOTE — Consult Note (Signed)
   [facility]   03/26/2016  Ronita HippsRoberta Lee Earwood [birth year] [id number]   Referral received from inpatient DM Coordinator for [facility]. Called into patient's room. Explained and offered [facility]. She is agreeable and verbal consent obtained. She endorses she sometimes has issues affording her insulin, Toujeo. She denies having issues with transportation. Explained to her that she will receive post hospital discharge calls and will be evaluated for monthly home visits. Explained these services will not interfere with home health. Confirmed best contact number as [phone]. [facility]. She is not sure the name of her pharmacy but she says it is [address]. Discussed that she will receive calls [facility] as well as [facility] RN[facility] post discharge. Made inpatient RNCM aware that [facility] will follow post discharge.  Raiford NobleAtika Hall, MSN-Ed, RN,BSN [facility] Care Management Hospital Liaison [phone]

## 2016-03-26 NOTE — Progress Notes (Addendum)
Inpatient Diabetes Program Recommendations  AACE/ADA: New Consensus Statement on Inpatient Glycemic Control (2015)  Target Ranges:  Prepandial:   less than 140 mg/dL      Peak postprandial:   less than 180 mg/dL (1-2 hours)      Critically ill patients:  140 - 180 mg/dL   Lab Results  Component Value Date   GLUCAP 52 (L) 03/26/2016   HGBA1C 12.7 (H) 03/23/2016    Review of Glycemic Control  Results for Bidwell, Silverio LayROBERTA LEE (MRN [id number]) as of 03/26/2016 08:13  Ref. Range 03/25/2016 13:31 03/25/2016 14:05 03/25/2016 16:56 03/25/2016 20:44 03/26/2016 07:45  Glucose-Capillary Latest Ref Range: 65 - 99 mg/dL 68 68 409361 (H) 811239 (H) 52 (L)    Outpatient Diabetes medications: Toujeo 40 units QHS, Novolog 0-14 units TID with meals (based on correction scale)- Dr. Tedd SiasSolum notes from 01/02/16 state patient should be taking  Humalog VIAL 8 units tid qAC + SSI of 1 unit per sugar of 50 mg/dl over a target of 914150.   Current orders for Inpatient glycemic control: Lantus 50 units QHS, Novolog 0-15 units TID with meals, Novolog 0-5 units QHS   Fasting blood sugar 52mg /dl this am.   Please consider decreasing Lantus to 45 units qhs and add Novolog 2 units tid with meals.  Consider decreasing Novolog correction scale to 0-9 units tid  Susette RacerJulie Alden Feagan, RN, [place], [place], CDE Diabetes Coordinator Inpatient Diabetes Program  [phone] (Team Pager) [phone] [facility] Office) 03/26/2016 8:26 AM

## 2016-03-26 NOTE — Progress Notes (Signed)
Collaborated with Dr. Winona LegatoVaickute for discharge insulin orders for ease for patient.  MD has ordered Toujeo 40 units qhs and Novolog 7 units tid before meals.  I have written this out in large print for the patient and have discussed this with her.  To check blood sugars before meals.   I also discussed the treatment of low blood sugar and written this for her to keep on her fridge- recommended she leave a juice box and peanut butter crackers at her bedside.  Susette RacerJulie Loreta Blouch, RN, BA, MHA, CDE Diabetes Coordinator Inpatient Diabetes Program  [phone] (Team Pager) [phone] [facility] Office) 03/26/2016 3:47 PM

## 2016-03-26 NOTE — Discharge Summary (Signed)
[facility] [place] at [facility]   PATIENT NAME: Vicki [name]    MR#:  [id number]  DATE OF BIRTH:  [birth year]  DATE OF ADMISSION:  03/22/2016 ADMITTING PHYSICIAN: Shaune Pollack, MD  DATE OF DISCHARGE: No discharge date for patient encounter.  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 Diabetes mellitus without complication (HCC)   . GERD (gastroesophageal reflux disease)   . Hypertension     .pro HOSPITAL COURSE:  The patient is 75 year old African-American female with past history significant for history of diabetes mellitus, insulin-dependent, who presented to the hospital with complaints of nausea, vomiting, abdominal pain. On arrival to the hospital, she was noted to be hyperglycemic with a glucose level of 765  and anion gap of 17, she was also noted to have acute renal insufficiency with creatinine of 1.12, she was admitted to the hospital for IV insulin and IV fluids. She improved clinically, and she was changed to insulin Lantus, which was advanced depending on her blood glucose levels. He'll hemoglobin A1c was checked and it was found to be elevated at 12.7, worse than in the past. Patient was advised to Continue her long-acting insulin at 40 units subcutaneously, checking her blood glucose levels in the morning closely, she was also recommended to initiate meal coverage for short-acting insulin, follow-up with endocrinologist, Dr. Tedd Sias. Patient would benefit from nursing intervention at home, education, discussed with care management. Meanwhile, to help her to understand insulin administration,  we are arranging for her home health services upon discharge. Patient had echocardiogram done due to chest pains, revealing wall motion abnormalities. The patient had Myoview stress test, unremarkable for inducible ischemia, per cardiologist, Dr. Gwen Pounds. Patient is being discharged home today.  Discussion by problem: #1 DKA in diabetic patient, hemoglobin A1c was 12.7, worsened from the past,, continue insulin Tuijeo at 40 units subcutaneously at bedtime, advance it as needed as outpatient, diabetic education will be provided for patient at home with home health services RN,  she is to continue follow-up with her endocrinologist, the patient is to initiate meal coverage with short-acting insulin. #2. Nausea, vomiting, resolved, likely DKA related , oral intake was good on the day of discharge, patient consumed 100% of offered meals. #3. Hypokalemia, resolved #4. Abdominal pain, resolved, continue patient on diabetic diet.  #4. Chest pain, echocardiogram revealed normal ejection fraction of 55%, anteroseptal wall motion abnormality, grade 1 diastolic dysfunction. Myoview stress test was normal, patient was advised to continue to follow up with cardiologist, Dr. Juliann Pares as outpatient. She was initiated on Imdur, aspirin, following clinically, as she reports stable angina symptoms.  DISCHARGE CONDITIONS:   stable  CONSULTS OBTAINED:    DRUG ALLERGIES:  No Known Allergies  DISCHARGE MEDICATIONS:   Current Discharge Medication List    START taking these medications   Details  aspirin EC 81 MG EC tablet Take 1 tablet (81 mg total) by mouth daily. Qty: 30 tablet, Refills: 6    isosorbide mononitrate (IMDUR) 30 MG 24 hr tablet Take 1 tablet (30 mg total) by mouth daily. Qty: 30 tablet, Refills: 6      CONTINUE  these medications which have CHANGED   Details  Insulin Glargine (TOUJEO SOLOSTAR) 300 UNIT/ML SOPN Inject 40 Units into the skin at bedtime. Qty: 1 pen, Refills: 0    insulin  lispro (HUMALOG) 100 UNIT/ML injection Inject 0.07 mLs (7 Units total) into the skin 3 (three) times daily before meals. Pt uses per sliding scale. Qty: 10 mL, Refills: 11      CONTINUE these medications which have NOT CHANGED   Details  acetaminophen (TYLENOL) 500 MG tablet Take 1,000-1,500 mg by mouth every 6 (six) hours as needed for mild pain or headache.    brimonidine-timolol (COMBIGAN) 0.2-0.5 % ophthalmic solution Place 1 drop into both eyes daily.    citalopram (CELEXA) 20 MG tablet Take 20 mg by mouth daily.    labetalol (NORMODYNE) 100 MG tablet Take 100 mg by mouth 2 (two) times daily.    lidocaine (LIDODERM) 5 % Place 1 patch onto the skin every 12 (twelve) hours. Remove & Discard patch within 12 hours or as directed by MD Qty: 10 patch, Refills: 0    loratadine (CLARITIN) 10 MG tablet Take 10 mg by mouth daily.    lovastatin (MEVACOR) 20 MG tablet Take 20 mg by mouth at bedtime.    magnesium oxide (MAG-OX) 400 MG tablet Take 400 mg by mouth daily.    omeprazole (PRILOSEC) 20 MG capsule Take 1 capsule (20 mg total) by mouth daily. Qty: 30 capsule, Refills: 0    polyethylene glycol (MIRALAX / GLYCOLAX) packet Take 17 g by mouth daily. Qty: 14 each, Refills: 0    quinapril (ACCUPRIL) 20 MG tablet Take 20 mg by mouth daily.      STOP taking these medications     hydrochlorothiazide (MICROZIDE) 12.5 MG capsule      potassium chloride (K-DUR,KLOR-CON) 10 MEQ tablet          DISCHARGE INSTRUCTIONS:    Stable  If you experience worsening of your admission symptoms, develop shortness of breath, life threatening emergency, suicidal or homicidal thoughts you must seek medical attention immediately by calling 911 or calling your MD immediately  if symptoms less severe.  You Must read complete instructions/literature along with all the possible adverse reactions/side effects for all the Medicines you take and that have been prescribed to you. Take any new Medicines  after you have completely understood and accept all the possible adverse reactions/side effects.   Please note  You were cared for by a hospitalist during your hospital stay. If you have any questions about your discharge medications or the care you received while you were in the hospital after you are discharged, you can call the unit and asked to speak with the hospitalist on call if the hospitalist that took care of you is not available. Once you are discharged, your primary care physician will handle any further medical issues. Please note that NO REFILLS for any discharge medications will be authorized once you are discharged, as it is imperative that you return to your primary care physician (or establish a relationship with a primary care physician if you do not have one) for your aftercare needs so that they can reassess your need for medications and monitor your lab values.    Today   CHIEF COMPLAINT:   Chief Complaint  Patient presents with  . Chest Pain    HISTORY OF PRESENT ILLNESS:  Vicki [name]  is a 75 y.o. female with a known history of diabetes mellitus, insulin-dependent, who presented to the hospital with  complaints of nausea, vomiting, abdominal pain. On arrival to the hospital, she was noted to be hyperglycemic with a glucose level of 765  and anion gap of 17, she was also noted to have acute renal insufficiency with creatinine of 1.12, she was admitted to the hospital for IV insulin and IV fluids. She improved clinically, and she was changed to insulin Lantus, which was advanced depending on her blood glucose levels. He'll hemoglobin A1c was checked and it was found to be elevated at 12.7, worse than in the past. Patient was advised to Continue her long-acting insulin at 40 units subcutaneously, checking her blood glucose levels in the morning closely, she was also recommended to initiate meal coverage for short-acting insulin, follow-up with endocrinologist, Dr. Tedd SiasSolum.  Patient would benefit from nursing intervention at home, education, discussed with care management. Meanwhile, to help her to understand insulin administration, we are arranging for her home health services upon discharge. Patient had echocardiogram done due to chest pains, revealing wall motion abnormalities. The patient had Myoview stress test, unremarkable for inducible ischemia, per cardiologist, Dr. Gwen PoundsKowalski. Patient is being discharged home today.  Discussion by problem: #1 DKA in diabetic patient, hemoglobin A1c was 12.7, worsened from the past,, continue insulin Tuijeo at 40 units subcutaneously at bedtime, advance it as needed as outpatient, diabetic education will be provided for patient at home with home health services RN,  she is to continue follow-up with her endocrinologist, the patient is to initiate meal coverage with short-acting insulin. #2. Nausea, vomiting, resolved, likely DKA related , oral intake was good on the day of discharge, patient consumed 100% of offered meals. #3. Hypokalemia, resolved #4. Abdominal pain, resolved, continue patient on diabetic diet.  #4. Chest pain, echocardiogram revealed normal ejection fraction of 55%, anteroseptal wall motion abnormality, grade 1 diastolic dysfunction. Myoview stress test was normal, patient was advised to continue to follow up with cardiologist, Dr. Juliann Paresallwood as outpatient. She was initiated on Imdur, aspirin, following clinically, as she reports stable angina symptoms.     VITAL SIGNS:  Blood pressure (!) 142/61, pulse 74, temperature 98.5 F (36.9 C), temperature source Oral, resp. rate 20, height 5\' 5"  (1.651 m), weight 69.5 kg (153 lb 3.5 oz), SpO2 99 %.  I/O:    Intake/Output Summary (Last 24 hours) at 03/26/16 1537 Last data filed at 03/26/16 1300  Gross per 24 hour  Intake              720 ml  Output                0 ml  Net              720 ml    PHYSICAL EXAMINATION:  GENERAL:  75 y.o.-year-old patient lying in  the bed with no acute distress.  EYES: Pupils equal, round, reactive to light and accommodation. No scleral icterus. Extraocular muscles intact.  HEENT: Head atraumatic, normocephalic. Oropharynx and nasopharynx clear.  NECK:  Supple, no jugular venous distention. No thyroid enlargement, no tenderness.  LUNGS: Normal breath sounds bilaterally, no wheezing, rales,rhonchi or crepitation. No use of accessory muscles of respiration.  CARDIOVASCULAR: S1, S2 normal. No murmurs, rubs, or gallops.  ABDOMEN: Soft, non-tender, non-distended. Bowel sounds present. No organomegaly or mass.  EXTREMITIES: No pedal edema, cyanosis, or clubbing.  NEUROLOGIC: Cranial nerves II through XII are intact. Muscle strength 5/5 in all extremities. Sensation intact. Gait not checked.  PSYCHIATRIC: The patient is alert and oriented x 3.  SKIN: No  obvious rash, lesion, or ulcer.   DATA REVIEW:   CBC  Recent Labs Lab 03/23/16 0226  WBC 9.2  HGB 11.9*  HCT 34.4*  PLT 249    Chemistries   Recent Labs Lab 03/23/16 0017  03/25/16 0518  NA 137  < > 138  K 3.4*  < > 3.9  CL 106  < > 106  CO2 25  < > 25  GLUCOSE 101*  < > 166*  BUN 26*  < > 18  CREATININE 0.89  < > 0.70  CALCIUM 8.9  < > 9.0  MG 1.7  --   --   < > = values in this interval not displayed.  Cardiac Enzymes  Recent Labs Lab 03/25/16 0840  TROPONINI <0.03    Microbiology Results  Results for orders placed or performed during the hospital encounter of 03/22/16  MRSA PCR Screening     Status: None   Collection Time: 03/23/16  7:50 PM  Result Value Ref Range Status   MRSA by PCR NEGATIVE NEGATIVE Final    Comment:        The GeneXpert MRSA Assay (FDA approved for NASAL specimens only), is one component of a comprehensive MRSA colonization surveillance program. It is not intended to diagnose MRSA infection nor to guide or monitor treatment for MRSA infections.     RADIOLOGY:  Nm Myocar Multi W/spect W/wall Motion /  Ef  Result Date: 03/26/2016  The study is normal.  This is a low risk study.  The left ventricular ejection fraction is normal (55-65%).  There was no ST segment deviation noted during stress.     EKG:   Orders placed or performed during the hospital encounter of 03/22/16  . EKG 12-Lead  . EKG 12-Lead  . ED EKG within 10 minutes  . ED EKG within 10 minutes      Management plans discussed with the patient, family and they are in agreement.  CODE STATUS:     Code Status Orders        Start     Ordered   03/22/16 1748  Do not attempt resuscitation (DNR)  Continuous    Question Answer Comment  In the event of cardiac or respiratory ARREST Do not call a "code blue"   In the event of cardiac or respiratory ARREST Do not perform Intubation, CPR, defibrillation or ACLS   In the event of cardiac or respiratory ARREST Use medication by any route, position, wound care, and other measures to relive pain and suffering. May use oxygen, suction and manual treatment of airway obstruction as needed for comfort.      03/22/16 1747    Code Status History    Date Active Date Inactive Code Status Order ID Comments User Context   03/22/2016  5:47 PM 03/24/2016  6:00 AM DNR [id number]  Shaune PollackQing Chen, MD Inpatient   09/27/2015 11:03 AM 09/28/2015  6:31 PM DNR [id number]  Lewie LoronMagadalene S Tukov, NP Inpatient   09/27/2015 10:51 AM 09/27/2015 10:51 AM Partial Code [id number]  Lewie LoronMagadalene S Tukov, NP Inpatient   09/25/2015 11:17 PM 09/27/2015 10:51 AM DNR [id number]  Arnaldo NatalMichael S Diamond, MD Inpatient   09/25/2015  9:38 PM 09/25/2015 11:17 PM Full Code [id number]  Arnaldo NatalMichael S Diamond, MD Inpatient    Questions for Most Recent Historical Code Status (Order [id number])    Question Answer Comment   In the event of cardiac or respiratory ARREST Do not call a "code blue"    In the  event of cardiac or respiratory ARREST Do not perform Intubation, CPR, defibrillation or ACLS    In the event of cardiac or respiratory ARREST Use  medication by any route, position, wound care, and other measures to relive pain and suffering. May use oxygen, suction and manual treatment of airway obstruction as needed for comfort.       TOTAL TIME TAKING CARE OF THIS PATIENT: 40 minutes.    Katharina Caper M.D on 03/26/2016 at 3:37 PM  Between 7am to 6pm - Pager - [phone]  After 6pm go to [email or web address] - password [email or web address] [facility]  [facility]  Office  [phone]  CC: Primary care physician; Mick Sell, MD

## 2016-03-27 ENCOUNTER — Encounter: Payer: Self-pay | Admitting: *Deleted

## 2016-03-27 ENCOUNTER — Other Ambulatory Visit: Payer: Self-pay | Admitting: *Deleted

## 2016-03-27 DIAGNOSIS — Z794 Long term (current) use of insulin: Secondary | ICD-10-CM | POA: Diagnosis not present

## 2016-03-27 DIAGNOSIS — Z7982 Long term (current) use of aspirin: Secondary | ICD-10-CM | POA: Diagnosis not present

## 2016-03-27 DIAGNOSIS — E1165 Type 2 diabetes mellitus with hyperglycemia: Secondary | ICD-10-CM | POA: Diagnosis not present

## 2016-03-27 DIAGNOSIS — K219 Gastro-esophageal reflux disease without esophagitis: Secondary | ICD-10-CM | POA: Diagnosis not present

## 2016-03-27 DIAGNOSIS — I10 Essential (primary) hypertension: Secondary | ICD-10-CM | POA: Diagnosis not present

## 2016-03-27 NOTE — Patient Outreach (Signed)
Transition of care call successful, follow up on recent hospitalization 8/18-8/22 Diabetic Ketoacidosis.  Spoke with pt, HIPAA verified.  Pt reports she only took two  of her medications last night,  thought the rest were stopped until  [facility], checked her BP and it was high. Pt reports HH RN went over her discharge medications with her, got it straight now, took all of her medications today.    Pt reports she misunderstood her discharge instructions.  Pt reports she usually does her own medications  but niece did her pill planner.     Pt reports her sugar this am was 124, did not check it at lunch, inquired if she needs to check it when she has a snack.   Pt reports she did take the Novolog 7 units at breakfast but cannot remember if she took it at lunch.   RN CM reviewed with pt discharge orders for  her insulin, checking sugars = Toujeo 40 units every night, Novolog 7 units three times a day before meals, check sugars before meals to which pt voiced understanding.  Pt reports as far as transportation was  told not to drive, most of her family works during the day, maybe her grandson can take her.  RN CM informed pt of transportation benefit through her insurance,contact number for [employer] given to pt's niece as requested.  Pt reports she is to see Dr. Sampson GoonFitzgerald 8/29, Dr. Lendell CapriceSullivan and Dr. Graciela HusbandsKlein 8/30.   Pt reports also to follow up with infection MD and diabetic MD.  RN  CM discussed with pt [facility] transition of care program- follow up next 31 days- weekly phone calls, home visit.    Plan: RN CM to follow up with pt again 8/28- initial home visit (part of ongoing transition           Of care).            RN CM to inform Dr. Sampson GoonFitzgerald of [facility] involvement- fax barrier letter.   Shayne Alkenose M.   Pierzchala RN CCM [facility]  [phone]

## 2016-03-27 NOTE — Patient Outreach (Signed)
Attempt made to contact pt for transition of care, follow up on recent hospitalization for diabetic ketoacidosis 8/18-8/22.   Person answering the phone states pt is not available right now.    Plan:  RN CM to call pt back later today.   Shayne Alkenose M.   Nephtali Docken RN CCM [facility] Care [facility]  [phone]

## 2016-03-28 ENCOUNTER — Other Ambulatory Visit: Payer: Self-pay | Admitting: Pharmacist

## 2016-03-28 DIAGNOSIS — Z7982 Long term (current) use of aspirin: Secondary | ICD-10-CM | POA: Diagnosis not present

## 2016-03-28 DIAGNOSIS — Z794 Long term (current) use of insulin: Secondary | ICD-10-CM | POA: Diagnosis not present

## 2016-03-28 DIAGNOSIS — E1165 Type 2 diabetes mellitus with hyperglycemia: Secondary | ICD-10-CM | POA: Diagnosis not present

## 2016-03-28 DIAGNOSIS — I10 Essential (primary) hypertension: Secondary | ICD-10-CM | POA: Diagnosis not present

## 2016-03-28 DIAGNOSIS — K219 Gastro-esophageal reflux disease without esophagitis: Secondary | ICD-10-CM | POA: Diagnosis not present

## 2016-03-28 NOTE — Patient Outreach (Signed)
[facility] Care Management  03/28/2016  Vicki HippsRoberta [name] 27-Nov-1940 [id number]  [facility] received a referral from Vicki NobleAtika [name], [facility] Liaison to evaluate patient medication patient assistance options.    Successful outreach to patient today---she verified HIPAA details and purpose of call was explained to her.   Patient has [facility], Vicki [name], as well as multiple MD appointments she says.   Patient states she prefers a home visit to review her medications and discuss patient assistance options, if available.  Patient reports she isn't sure of her out-of-pocket spend on prescriptions thus far in 2017---she agrees to go to her pharmacy to request this information prior to scheduled home visit.   Plan:  Home visit scheduled with patient.   Vicki [name], PharmD, [facility] Clinical Pharmacist [facility] [phone]

## 2016-03-29 ENCOUNTER — Encounter: Payer: Self-pay | Admitting: *Deleted

## 2016-04-01 ENCOUNTER — Encounter: Payer: Self-pay | Admitting: *Deleted

## 2016-04-01 ENCOUNTER — Other Ambulatory Visit: Payer: Self-pay | Admitting: *Deleted

## 2016-04-01 DIAGNOSIS — Z7982 Long term (current) use of aspirin: Secondary | ICD-10-CM | POA: Diagnosis not present

## 2016-04-01 DIAGNOSIS — Z794 Long term (current) use of insulin: Secondary | ICD-10-CM | POA: Diagnosis not present

## 2016-04-01 DIAGNOSIS — K219 Gastro-esophageal reflux disease without esophagitis: Secondary | ICD-10-CM | POA: Diagnosis not present

## 2016-04-01 DIAGNOSIS — I10 Essential (primary) hypertension: Secondary | ICD-10-CM | POA: Diagnosis not present

## 2016-04-01 DIAGNOSIS — E1165 Type 2 diabetes mellitus with hyperglycemia: Secondary | ICD-10-CM | POA: Diagnosis not present

## 2016-04-01 NOTE — Patient Outreach (Signed)
[facility]) Care Management   04/01/2016  Eretria Manternach 1941/02/10 [id number]  Davanee Klinkner is an 75 y.o. female  Subjective:  Pt reports dealing with a  headache today, started yesterday, question if side effect of  New medication (Imdur) or is sinuses.  Pt reports finds a place of quiet, rest- headache goes away.  Pt reports to  f/u with Dr. Graciela Husbands and Dr. Tedd Sias 8/30.  Pt reports niece has been helping her with her medications,doing a pill planner as she got them confused when she first came home.  Pt reports her sugar when she came to the hospital was 700 something but can't handle low sugars.  Pt reports she  had some low sugars-  uses orange juice, helps.    Objective:   Vitals:   04/01/16 1122 04/01/16 1252  BP:  (!) 168/78  Pulse: 65   Resp: 16       ROS  Physical Exam  Constitutional: She is oriented to person, place, and time. She appears well-developed and well-nourished.  Cardiovascular: Normal rate, regular rhythm and normal heart sounds.   Respiratory: Effort normal and breath sounds normal.  GI: Soft.  Musculoskeletal: Normal range of motion. She exhibits no edema.  Neurological: She is alert and oriented to person, place, and time.  Skin: Skin is warm and dry.  Psychiatric: She has a normal mood and affect. Her behavior is normal. Judgment and thought content normal.    Encounter Medications:   Outpatient Encounter Prescriptions as of 04/01/2016  Medication Sig Note  . acetaminophen (TYLENOL) 500 MG tablet Take 1,000-1,500 mg by mouth every 6 (six) hours as needed for mild pain or headache. 04/01/2016: As needed.   Marland Kitchen aspirin EC 81 MG EC tablet Take 1 tablet (81 mg total) by mouth daily.   . brimonidine-timolol (COMBIGAN) 0.2-0.5 % ophthalmic solution Place 1 drop into both eyes daily.   . citalopram (CELEXA) 20 MG tablet Take 20 mg by mouth daily.   . Insulin Glargine (TOUJEO SOLOSTAR) 300 UNIT/ML SOPN Inject 40 Units into the skin at  bedtime.   . insulin lispro (HUMALOG) 100 UNIT/ML injection Inject 0.07 mLs (7 Units total) into the skin 3 (three) times daily before meals. Pt uses per sliding scale. 04/01/2016: Pt taking Novolog   . isosorbide mononitrate (IMDUR) 30 MG 24 hr tablet Take 1 tablet (30 mg total) by mouth daily.   Marland Kitchen labetalol (NORMODYNE) 100 MG tablet Take 100 mg by mouth 2 (two) times daily.   Marland Kitchen loratadine (CLARITIN) 10 MG tablet Take 10 mg by mouth daily.   Marland Kitchen lovastatin (MEVACOR) 20 MG tablet Take 20 mg by mouth at bedtime.   . magnesium oxide (MAG-OX) 400 MG tablet Take 400 mg by mouth daily.   . Multiple Vitamins-Minerals (CENTRUM ADULTS) TABS Take by mouth. 04/01/2016: Daily   . omeprazole (PRILOSEC) 20 MG capsule Take 1 capsule (20 mg total) by mouth daily.   . quinapril (ACCUPRIL) 20 MG tablet Take 20 mg by mouth daily.   Marland Kitchen lidocaine (LIDODERM) 5 % Place 1 patch onto the skin every 12 (twelve) hours. Remove & Discard patch within 12 hours or as directed by MD (Patient not taking: Reported on 09/25/2015)   . polyethylene glycol (MIRALAX / GLYCOLAX) packet Take 17 g by mouth daily. (Patient not taking: Reported on 09/25/2015)    No facility-administered encounter medications on file as of 04/01/2016.     Functional Status:   In your present state of health,  do you have any difficulty performing the following activities: 04/01/2016 03/22/2016  Hearing? N N  Vision? N N  Difficulty concentrating or making decisions? Y N  Walking or climbing stairs? N N  Dressing or bathing? N N  Doing errands, shopping? Y N  Preparing Food and eating ? Y -  Using the Toilet? N -  In the past six months, have you accidently leaked urine? Y -  Do you have problems with loss of bowel control? Y -  Managing your Medications? Y -  Managing your Finances? Y -  Housekeeping or managing your Housekeeping? N -  Some recent data might be hidden    Fall/Depression Screening:    PHQ 2/9 Scores 04/01/2016  PHQ - 2 Score 1     Assessment:  Pleasant 75 year old woman, lives with her sister Stark Klein, niece and nephew assist as  Needed.   DM- view of pt's glucometer, sugar today 102, 7 day average 176.  Ranges 68-345, had 2 low  Readings since discharge (68,70).    Emmi information on DM ketoacidosis and DM- low sugar, self                    Provided/reviewed with pt and niece Lafonda Mosses.  Carb count sheet also provided/reviewed                   With pt/niece.                    HTN:  BP today 168/78 taking after pt had approximately 16 oz of water.  Per pt- has extra  Salt to help with cramps in legs- occur at night.                     Headache- informed pt common side effect of Imdur is headache plus if BP is elevated.                                        Pt took Tylenol during home visit.    Plan:  Plan to continue to follow pt for transition of care, follow up again next week telephonically.             Pt to follow up with Dr. Graciela Husbands 8/30, as discussed- pt to inform MD of recent headaches.             Pt to follow up with Dr. Tedd Sias 8/30.              Plan to fax Dr. Graciela Husbands today's home visit encounter.     [facility] CM Care Plan Problem One   Flowsheet Row Most Recent Value  Care Plan Problem One  Risk for readmission related to recent hospitalization for Diabetic ketoacidosis   Role Documenting the Problem One  Care Management Coordinator  Care Plan for Problem One  Active  [facility] Long Term Goal (31-90 days)  Pt would not readmit in the next 31 days   [facility] Long Term Goal Start Date  03/27/16  Interventions for Problem One Long Term Goal  Emmi - DM ketoacidosis, DM-low blood sugar, self care provided/reviewed with pt/niece.   [facility] CM Short Term Goal #1 (0-30 days)  Pt would take insulin as ordered for the next 30 days   [facility] CM Short Term Goal #1 Start Date  03/27/16  Interventions for  Short Term Goal #1  Reviewed with pt recent sugars, ongoing compliance with insulin regiment     [facility] CM Care Plan Problem Two    Flowsheet Row Most Recent Value  Care Plan Problem Two  Elevated BP   Role Documenting the Problem Two  Care Management Coordinator  Care Plan for Problem Two  Active  [facility] CM Short Term Goal #1 (0-30 days)  Pt would see improvement in BP readings in the next 21 days   [facility] CM Short Term Goal #1 Start Date  04/01/16  Interventions for Short Term Goal #2   Reviewed with pt checking BP, recording, Low Na+ diet, ongoing medication compliance.      Shayne Alkenose M.   Pierzchala RN CCM [facility] Care [facility]  [phone]

## 2016-04-04 ENCOUNTER — Other Ambulatory Visit: Payer: Self-pay | Admitting: Pharmacist

## 2016-04-04 ENCOUNTER — Ambulatory Visit: Payer: Self-pay | Admitting: Pharmacist

## 2016-04-04 DIAGNOSIS — E1065 Type 1 diabetes mellitus with hyperglycemia: Secondary | ICD-10-CM | POA: Diagnosis not present

## 2016-04-04 DIAGNOSIS — E782 Mixed hyperlipidemia: Secondary | ICD-10-CM | POA: Diagnosis not present

## 2016-04-04 DIAGNOSIS — H3500 Unspecified background retinopathy: Secondary | ICD-10-CM | POA: Diagnosis not present

## 2016-04-04 NOTE — Patient Outreach (Signed)
[facility]) Care Management  04/04/2016  Vicki HippsRoberta [name] [birth year] [id number]  Placed a phone call to patient's phone number in chart this morning to confirm previously scheduled home visit for today.  Person who answered phone stated patient was not available as patient was at a MD appointment.  Did not provide any HIPAA details.    Person who answered said they don't think patient will be back by 0930, which is the time of previously scheduled appointment.   Plan:  Will attempt to reach patient later to reschedule home visit.    04/04/16 1440  [facility] and verified HIPAA details.   She re-scheduled pharmacy home visit.   Tommye StandardKevin Brietta Manso, PharmD, [facility] Clinical Pharmacist [facility] [phone]

## 2016-04-05 DIAGNOSIS — I10 Essential (primary) hypertension: Secondary | ICD-10-CM | POA: Diagnosis not present

## 2016-04-05 DIAGNOSIS — E7800 Pure hypercholesterolemia, unspecified: Secondary | ICD-10-CM | POA: Diagnosis not present

## 2016-04-05 DIAGNOSIS — I2510 Atherosclerotic heart disease of native coronary artery without angina pectoris: Secondary | ICD-10-CM | POA: Diagnosis not present

## 2016-04-05 DIAGNOSIS — I25118 Atherosclerotic heart disease of native coronary artery with other forms of angina pectoris: Secondary | ICD-10-CM | POA: Diagnosis not present

## 2016-04-05 DIAGNOSIS — E1065 Type 1 diabetes mellitus with hyperglycemia: Secondary | ICD-10-CM | POA: Diagnosis not present

## 2016-04-05 DIAGNOSIS — F3340 Major depressive disorder, recurrent, in remission, unspecified: Secondary | ICD-10-CM | POA: Diagnosis not present

## 2016-04-05 DIAGNOSIS — M5136 Other intervertebral disc degeneration, lumbar region: Secondary | ICD-10-CM | POA: Diagnosis not present

## 2016-04-09 ENCOUNTER — Other Ambulatory Visit: Payer: Self-pay | Admitting: *Deleted

## 2016-04-09 NOTE — Patient Outreach (Signed)
Transition of care call successful after attempt made earlier today (told by person answering the phone to call back).  Follow up on recent hospitalization 8/18-8/22 diabetic ketoacidosis with Type 1 DM .  Spoke with pt, HIPAA verified.  Pt reports followed up with Dr.  Tedd SiasSolum last week, MD told her to mark down her sugars, last night sugar 187, this am 74.  Pt reports she ran out of strips and could not afford  To replace the ones for glucometer she was using,now using  GE glucometer- strips more affordable.   Pt reports MD told her she needs an insulin pump, gave her a number to call about the pump, left message with person, no return call. Pt reports taking  Insulin as ordered.   Pt reports BP at Dr. Pricilla HandlerSolum's visit was good but high when saw Dr. Graciela HusbandsKlein 9/1.  Pt reports BP was  high over the weekend, 200/197, 200/200- drank a lot of water, rested and it came down.  Pt reports taking her Isosorbide every day, got her own BP machine,BP  today 171/88.   Pt reports to f/u with Dr. Juliann Paresallwood 9/14.    Plan: As discussed with pt, plan to f/u again next week telephonically as part of ongoing transition of care.     Shayne Alkenose M.   Kirstein Baxley RN CCM [facility] Care Management  [phone]

## 2016-04-09 NOTE — Patient Outreach (Signed)
10:13 am- Attempt made to contact pt for transition of care.  Person answering the phone reports pt not available, will be  home soon.     Plan:  RN CM to call pt back at a later time today.    Shayne Alkenose M.   Pierzchala RN CCM [facility] Care Management  [phone]

## 2016-04-11 ENCOUNTER — Other Ambulatory Visit: Payer: Self-pay | Admitting: Pharmacist

## 2016-04-11 NOTE — Patient Outreach (Signed)
[facility]) Care Management  [facility] CM [facility]   04/11/2016  Ronita HippsRoberta Lee Barto [birth year] [id number]  Subjective:  Ms Vicki [name] is a 75 year old female who was referred to [facility] CM Pharm[facility] by Gerre ScullAtika, [facility] Liaison for patient assistance evaluation.    Home visit was completed with patient on 04/11/16 and she verified her HIPAA details upon arrival.    Patient had her medications out for review and she states that she had a question about her blood pressure cuff.  Patient states she has not applied for SSA Extra Help in the past that she is aware of and she isn't sure on her total out-of-pocket prescription spend thus far this year.     Objective:   Encounter Medications: Outpatient Encounter Prescriptions as of 04/11/2016  Medication Sig Note  . acetaminophen (TYLENOL) 500 MG tablet Take 1,000-1,500 mg by mouth every 6 (six) hours as needed for mild pain or headache. 04/01/2016: As needed.   Marland Kitchen. aspirin EC 81 MG EC tablet Take 1 tablet (81 mg total) by mouth daily.   . brimonidine-timolol (COMBIGAN) 0.2-0.5 % ophthalmic solution Place 1 drop into both eyes daily.   . isosorbide mononitrate (IMDUR) 30 MG 24 hr tablet Take 1 tablet (30 mg total) by mouth daily.   Marland Kitchen. labetalol (NORMODYNE) 100 MG tablet Take 100 mg by mouth 2 (two) times daily.   Marland Kitchen. lovastatin (MEVACOR) 20 MG tablet Take 20 mg by mouth at bedtime.   . magnesium oxide (MAG-OX) 400 MG tablet Take 400 mg by mouth daily.   Marland Kitchen. omeprazole (PRILOSEC) 20 MG capsule Take 1 capsule (20 mg total) by mouth daily.   . quinapril (ACCUPRIL) 20 MG tablet Take 20 mg by mouth daily.   . citalopram (CELEXA) 20 MG tablet Take 20 mg by mouth daily.   . Insulin Glargine (TOUJEO SOLOSTAR) 300 UNIT/ML SOPN Inject 40 Units into the skin at bedtime.   . insulin lispro (HUMALOG) 100 UNIT/ML injection Inject 0.07 mLs (7 Units total) into the skin 3 (three) times daily before meals. Pt uses per sliding scale. 04/01/2016: Pt taking Novolog    . lidocaine (LIDODERM) 5 % Place 1 patch onto the skin every 12 (twelve) hours. Remove & Discard patch within 12 hours or as directed by MD (Patient not taking: Reported on 09/25/2015)   . loratadine (CLARITIN) 10 MG tablet Take 10 mg by mouth daily.   . Multiple Vitamins-Minerals (CENTRUM ADULTS) TABS Take by mouth. 04/01/2016: Daily   . polyethylene glycol (MIRALAX / GLYCOLAX) packet Take 17 g by mouth daily. (Patient not taking: Reported on 09/25/2015)    No facility-administered encounter medications on file as of 04/11/2016.     Functional Status: In your present state of health, do you have any difficulty performing the following activities: 04/01/2016 03/22/2016  Hearing? N N  Vision? N N  Difficulty concentrating or making decisions? Y N  Walking or climbing stairs? N N  Dressing or bathing? N N  Doing errands, shopping? Y N  Preparing Food and eating ? Y -  Using the Toilet? N -  In the past six months, have you accidently leaked urine? Y -  Do you have problems with loss of bowel control? Y -  Managing your Medications? Y -  Managing your Finances? Y -  Housekeeping or managing your Housekeeping? N -  Some recent data might be hidden    Fall/Depression Screening: PHQ 2/9 Scores 04/01/2016  PHQ - 2 Score 1  Assessment:  Medication assistance:  Discussed with patient that she may have cost savings for a 90 day supply of her prescriptions via [employer].  Also discussed with patient SSA Extra Help---she declined applying at this time but was encouraged to apply if she feels her income is under the requirements.  Blood pressure cuff---she was wanting to know how to see previous readings.  It appears her blood pressure machine does not store her readings and she was counseled to record them daily.      Drugs sorted by system:  Neurologic/Psychologic: -citalopram (patient reports she is not taking and it is not in her possession)   Cardiovascular: -aspirin  81 mg  -isosorbide mononitrate (new on hospital discharge) -labetalol -lovastatin 40 mg (20 mg was on discharge summary but patient is taking 40 mg)  -quinapril   Pulmonary/Allergy: -loratadine (patient reports she is not using)   Gastrointestinal: -omeprazole -polyethylene glycol (Miralax) patient reports she is not using  Endocrine: -insulin glargine (Toujeo) -insulin aspart (Novolog)   Pain: -acetaminophen as needed -lidocaine patch (patient reports she is not using)   Vitamins/Minerals: -magnesium   Miscellaneous: -brimonidine/timolol eye drop -brimonidine eye drop  Duplications in therapy:  -patient is using brimonidine and brimonidine/timolol eye drops   Other issues noted:  -potassium and hydrochlorothiazide appear to have been discontinued per 03/26/16 hospital discharge medication list.   -lovastatin dose discrepancy between what patient is taking and 03/26/16 hospital discharge summary.    Plan:  Placed call to patient's eye doctor, Dr Alvester Morin, regarding patient using both brimonidine and brimonidine/timolol eye drops on 04/11/16 at 1033 and left a message with Joni Reining with office.    Received a call back from [place], with Dr Alvester Morin on 04/11/16 at 1310, and Joni Reining stated that per Dr Alvester Morin, patient is to use both eye drops.  She stated she would be calling patient as well to schedule an appointment for patient with Dr Alvester Morin.    Called patient on 04/12/16 at 1501 to inform her that per Dr Shannan Harper office, Dr Alvester Morin wants her to use both eye drops---patient verbalized understanding.   Will route this initial note and barriers letter to patient's PCP.   Will place follow-up call to patient in about 2 weeks.    Tommye Standard, PharmD, [facility] Clinical Pharmacist [facility] [phone]

## 2016-04-13 DIAGNOSIS — I10 Essential (primary) hypertension: Secondary | ICD-10-CM | POA: Diagnosis not present

## 2016-04-13 DIAGNOSIS — Z7982 Long term (current) use of aspirin: Secondary | ICD-10-CM | POA: Diagnosis not present

## 2016-04-13 DIAGNOSIS — K219 Gastro-esophageal reflux disease without esophagitis: Secondary | ICD-10-CM | POA: Diagnosis not present

## 2016-04-13 DIAGNOSIS — Z794 Long term (current) use of insulin: Secondary | ICD-10-CM | POA: Diagnosis not present

## 2016-04-13 DIAGNOSIS — E1165 Type 2 diabetes mellitus with hyperglycemia: Secondary | ICD-10-CM | POA: Diagnosis not present

## 2016-04-15 ENCOUNTER — Encounter: Payer: Self-pay | Admitting: Pharmacist

## 2016-04-16 ENCOUNTER — Other Ambulatory Visit: Payer: Self-pay | Admitting: *Deleted

## 2016-04-16 NOTE — Patient Outreach (Signed)
Transition of care successful, ongoing follow up on recent hospitalization 8/12-8/22 diabetic ketoacidosis with Type I DM.  Spoke with pt, HIPAA verified.  Pt reports HH nurse came yesterday, checked her BP with her standing up- saw a drop, BP yesterday sitting 180/115.  Pt reports today checked her BP standing up,result was 125/68.  Pt reports she is very active, stands up fast, gets dizzy.  Pt reports since hospital discharge almost had a fall but caught herself, know now to stand a few minutes.  Pt reports changed f/u appointment with Heart MD, to see now 9/27, Eye MD 9/26.  Pt reports sugar today was 166, ranges 91- 355, sugars run higher at night, cannot tolerate if sugar runs below 100.  RN CM discussed with pt to check BP both sitting and standing, record, bring results to upcoming Heart MD visit to which pt voiced understanding.  Also discussed importance of hydration.     Plan:  As discussed, plan to f/u again next week - final transition of care call.    Shayne Alkenose M.   Angelli Baruch RN CCM [facility] Care Management  [phone]

## 2016-04-23 DIAGNOSIS — Z794 Long term (current) use of insulin: Secondary | ICD-10-CM | POA: Diagnosis not present

## 2016-04-23 DIAGNOSIS — K219 Gastro-esophageal reflux disease without esophagitis: Secondary | ICD-10-CM | POA: Diagnosis not present

## 2016-04-23 DIAGNOSIS — Z7982 Long term (current) use of aspirin: Secondary | ICD-10-CM | POA: Diagnosis not present

## 2016-04-23 DIAGNOSIS — I10 Essential (primary) hypertension: Secondary | ICD-10-CM | POA: Diagnosis not present

## 2016-04-23 DIAGNOSIS — E1165 Type 2 diabetes mellitus with hyperglycemia: Secondary | ICD-10-CM | POA: Diagnosis not present

## 2016-04-24 ENCOUNTER — Ambulatory Visit: Payer: Commercial Managed Care - HMO | Admitting: *Deleted

## 2016-04-24 ENCOUNTER — Other Ambulatory Visit: Payer: Self-pay | Admitting: Pharmacist

## 2016-04-24 NOTE — Patient Outreach (Addendum)
[facility]) Care Management  04/24/2016  Ronita HippsRoberta Lee Washington 1941/01/27 [id number]  Successful phone outreach to patient to follow-up her medication needs.  Patient verified her HIPAA details and reports that she has her medications.    She states her cardiologist resumed hydrochlorothiazide 12.5 mg daily and increased quinapril to 20 mg twice daily on 04/23/16.    Patient reports that she is still concerned with her insulin cost.   Discussed SSA Extra Help (she did not wish to apply during home visit) and manufacturer patient assistance out-of-pocket spend requirements with her during home visit on 04/11/16 and again during follow-up phone call.    Also discussed that her [place] offers $0/90 day supply of Tier 1 and 2 medications via its mail order pharmacy which is another way for her to reduce her out-of-pocket prescription costs.    Patient was again encouraged to apply to [facility] Extra Help if her income meets the requirements.  Asked patient what her goal for insulin cost was, and she reports she would like it to be as inexpensive as possible.   Discussed with patient that her plan has a $47 co-pay/30 day supply for insulin, and all insulin is brand name only.  Discussed with patient options to reduce out-of-pocket cost for insulin include applying to [facility] Extra Help to see if she is eligible, otherwise she must meet the out-of-pocket spend requirement ($1,000 for [employer] patient assistance and 5% of yearly household income for [employer]) to apply to [employer]urer programs.    Patient denies other medication concerns and she states she is filling her weekly pill planner without difficulty.   She agrees to pharmacy case closure as she reports she has no other pharmacy needs right now.   Plan:  Will close pharmacy case---she was provided with SSA Extra Help phone number should she wish to apply to see if she is eligible.    Provided patient with [facility] Pharmacist phone number  should she have new pharmacy related needs/questions.   Updated [facility] RN [facility] Care Coordinator, Welford Rocheose P, via phone of pharmacy case closure.   Tommye StandardKevin Orva Riles, PharmD, [facility]BCACP Clinical Pharmacist [facility] [phone]

## 2016-04-26 ENCOUNTER — Other Ambulatory Visit: Payer: Self-pay | Admitting: *Deleted

## 2016-04-26 ENCOUNTER — Ambulatory Visit: Payer: Self-pay | Admitting: *Deleted

## 2016-04-26 ENCOUNTER — Encounter: Payer: Self-pay | Admitting: *Deleted

## 2016-04-26 DIAGNOSIS — I208 Other forms of angina pectoris: Secondary | ICD-10-CM | POA: Diagnosis not present

## 2016-04-26 DIAGNOSIS — I2510 Atherosclerotic heart disease of native coronary artery without angina pectoris: Secondary | ICD-10-CM | POA: Diagnosis not present

## 2016-04-26 DIAGNOSIS — E785 Hyperlipidemia, unspecified: Secondary | ICD-10-CM | POA: Diagnosis not present

## 2016-04-26 DIAGNOSIS — F329 Major depressive disorder, single episode, unspecified: Secondary | ICD-10-CM | POA: Diagnosis not present

## 2016-04-26 DIAGNOSIS — Z8673 Personal history of transient ischemic attack (TIA), and cerebral infarction without residual deficits: Secondary | ICD-10-CM | POA: Diagnosis not present

## 2016-04-26 DIAGNOSIS — E119 Type 2 diabetes mellitus without complications: Secondary | ICD-10-CM | POA: Diagnosis not present

## 2016-04-26 DIAGNOSIS — K219 Gastro-esophageal reflux disease without esophagitis: Secondary | ICD-10-CM | POA: Diagnosis not present

## 2016-04-26 DIAGNOSIS — R0602 Shortness of breath: Secondary | ICD-10-CM | POA: Diagnosis not present

## 2016-04-26 DIAGNOSIS — I10 Essential (primary) hypertension: Secondary | ICD-10-CM | POA: Diagnosis not present

## 2016-04-26 NOTE — Patient Outreach (Signed)
Transition of care call successful (final call).  Recent hospitalization 8/12-8/22 for diabetic ketoacidosis.  Spoke with pt, HIPAA verified.  Pt reports saw Heart MD today, BP was good.  Pt reports she still gets high readings at home when she is sitting, discussed this with MD who suggested sitting for awhile before checking.  Pt reports sugars are good except the last 2 days (400's), something she ate as this am was 118.  Pt reports sugars range 120-180's, some 200's.  Pt reports sometimes sugar drops, one am sugar was 58, had juice and peanut butter crackers.  RN CM discussed with pt today is final transition of care call, inquired about following up again next month,provide ongoing community nurse case management services to which pt reports did not need, doing good.   Plan:  Case closure letter to be sent to pt.            Plan to fax case closure letter to Dr. Graciela HusbandsKlein.              Shayne Alkenose M.   Dhriti Fales RN CCM [facility] Care Management  [phone]

## 2016-04-27 ENCOUNTER — Other Ambulatory Visit: Payer: Self-pay | Admitting: *Deleted

## 2016-04-27 NOTE — Patient Outreach (Signed)
Care management program ended along with long term goal (transition of care).    Plan to inform [facility] care [facility] assistant to close case.    Shayne Alkenose M.   Pierzchala RN CCM [facility] Care Management  [phone]

## 2016-04-30 DIAGNOSIS — H524 Presbyopia: Secondary | ICD-10-CM | POA: Diagnosis not present

## 2016-04-30 DIAGNOSIS — H40153 Residual stage of open-angle glaucoma, bilateral: Secondary | ICD-10-CM | POA: Diagnosis not present

## 2016-05-02 DIAGNOSIS — Z7982 Long term (current) use of aspirin: Secondary | ICD-10-CM | POA: Diagnosis not present

## 2016-05-02 DIAGNOSIS — I10 Essential (primary) hypertension: Secondary | ICD-10-CM | POA: Diagnosis not present

## 2016-05-02 DIAGNOSIS — K219 Gastro-esophageal reflux disease without esophagitis: Secondary | ICD-10-CM | POA: Diagnosis not present

## 2016-05-02 DIAGNOSIS — Z794 Long term (current) use of insulin: Secondary | ICD-10-CM | POA: Diagnosis not present

## 2016-05-02 DIAGNOSIS — E1165 Type 2 diabetes mellitus with hyperglycemia: Secondary | ICD-10-CM | POA: Diagnosis not present

## 2016-05-07 DIAGNOSIS — I10 Essential (primary) hypertension: Secondary | ICD-10-CM | POA: Diagnosis not present

## 2016-05-07 DIAGNOSIS — E1165 Type 2 diabetes mellitus with hyperglycemia: Secondary | ICD-10-CM | POA: Diagnosis not present

## 2016-05-07 DIAGNOSIS — Z794 Long term (current) use of insulin: Secondary | ICD-10-CM | POA: Diagnosis not present

## 2016-05-07 DIAGNOSIS — Z7982 Long term (current) use of aspirin: Secondary | ICD-10-CM | POA: Diagnosis not present

## 2016-05-07 DIAGNOSIS — K219 Gastro-esophageal reflux disease without esophagitis: Secondary | ICD-10-CM | POA: Diagnosis not present

## 2016-05-14 DIAGNOSIS — K219 Gastro-esophageal reflux disease without esophagitis: Secondary | ICD-10-CM | POA: Diagnosis not present

## 2016-05-14 DIAGNOSIS — Z794 Long term (current) use of insulin: Secondary | ICD-10-CM | POA: Diagnosis not present

## 2016-05-14 DIAGNOSIS — E1165 Type 2 diabetes mellitus with hyperglycemia: Secondary | ICD-10-CM | POA: Diagnosis not present

## 2016-05-14 DIAGNOSIS — I10 Essential (primary) hypertension: Secondary | ICD-10-CM | POA: Diagnosis not present

## 2016-05-14 DIAGNOSIS — Z7982 Long term (current) use of aspirin: Secondary | ICD-10-CM | POA: Diagnosis not present

## 2016-05-22 DIAGNOSIS — K219 Gastro-esophageal reflux disease without esophagitis: Secondary | ICD-10-CM | POA: Diagnosis not present

## 2016-05-22 DIAGNOSIS — E1165 Type 2 diabetes mellitus with hyperglycemia: Secondary | ICD-10-CM | POA: Diagnosis not present

## 2016-05-22 DIAGNOSIS — I10 Essential (primary) hypertension: Secondary | ICD-10-CM | POA: Diagnosis not present

## 2016-05-22 DIAGNOSIS — Z794 Long term (current) use of insulin: Secondary | ICD-10-CM | POA: Diagnosis not present

## 2016-05-22 DIAGNOSIS — Z7982 Long term (current) use of aspirin: Secondary | ICD-10-CM | POA: Diagnosis not present

## 2016-05-28 ENCOUNTER — Ambulatory Visit
Admission: RE | Admit: 2016-05-28 | Discharge: 2016-05-28 | Disposition: A | Discharge status: Home / Self Care | Payer: Commercial Managed Care - HMO | Source: Ambulatory Visit | Attending: Internal Medicine | Admitting: Internal Medicine

## 2016-05-28 DIAGNOSIS — Z1231 Encounter for screening mammogram for malignant neoplasm of breast: Secondary | ICD-10-CM | POA: Diagnosis not present

## 2016-06-26 DIAGNOSIS — M7581 Other shoulder lesions, right shoulder: Secondary | ICD-10-CM | POA: Diagnosis not present

## 2016-06-26 DIAGNOSIS — J3489 Other specified disorders of nose and nasal sinuses: Secondary | ICD-10-CM | POA: Diagnosis not present

## 2016-09-29 ENCOUNTER — Emergency Department: Payer: Medicare PPO

## 2016-09-29 ENCOUNTER — Inpatient Hospital Stay
Admission: EM | Admit: 2016-09-29 | Discharge: 2016-10-04 | DRG: 282 | Disposition: A | Discharge status: Home Health | Payer: Medicare PPO | Attending: Internal Medicine | Admitting: Internal Medicine

## 2016-09-29 ENCOUNTER — Encounter: Payer: Self-pay | Admitting: Emergency Medicine

## 2016-09-29 DIAGNOSIS — Z23 Encounter for immunization: Secondary | ICD-10-CM

## 2016-09-29 DIAGNOSIS — R51 Headache: Secondary | ICD-10-CM | POA: Diagnosis present

## 2016-09-29 DIAGNOSIS — Z833 Family history of diabetes mellitus: Secondary | ICD-10-CM

## 2016-09-29 DIAGNOSIS — R079 Chest pain, unspecified: Secondary | ICD-10-CM | POA: Diagnosis present

## 2016-09-29 DIAGNOSIS — I214 Non-ST elevation (NSTEMI) myocardial infarction: Secondary | ICD-10-CM | POA: Diagnosis not present

## 2016-09-29 DIAGNOSIS — K5900 Constipation, unspecified: Secondary | ICD-10-CM | POA: Diagnosis present

## 2016-09-29 DIAGNOSIS — R109 Unspecified abdominal pain: Secondary | ICD-10-CM

## 2016-09-29 DIAGNOSIS — Z8249 Family history of ischemic heart disease and other diseases of the circulatory system: Secondary | ICD-10-CM

## 2016-09-29 DIAGNOSIS — K219 Gastro-esophageal reflux disease without esophagitis: Secondary | ICD-10-CM

## 2016-09-29 DIAGNOSIS — Z7982 Long term (current) use of aspirin: Secondary | ICD-10-CM

## 2016-09-29 DIAGNOSIS — R509 Fever, unspecified: Secondary | ICD-10-CM | POA: Diagnosis present

## 2016-09-29 DIAGNOSIS — R011 Cardiac murmur, unspecified: Secondary | ICD-10-CM | POA: Diagnosis present

## 2016-09-29 DIAGNOSIS — E1151 Type 2 diabetes mellitus with diabetic peripheral angiopathy without gangrene: Secondary | ICD-10-CM | POA: Diagnosis present

## 2016-09-29 DIAGNOSIS — I161 Hypertensive emergency: Secondary | ICD-10-CM

## 2016-09-29 DIAGNOSIS — Z794 Long term (current) use of insulin: Secondary | ICD-10-CM

## 2016-09-29 DIAGNOSIS — E119 Type 2 diabetes mellitus without complications: Secondary | ICD-10-CM

## 2016-09-29 DIAGNOSIS — R112 Nausea with vomiting, unspecified: Secondary | ICD-10-CM | POA: Diagnosis not present

## 2016-09-29 DIAGNOSIS — Z8673 Personal history of transient ischemic attack (TIA), and cerebral infarction without residual deficits: Secondary | ICD-10-CM

## 2016-09-29 DIAGNOSIS — I10 Essential (primary) hypertension: Secondary | ICD-10-CM | POA: Diagnosis present

## 2016-09-29 DIAGNOSIS — Z79899 Other long term (current) drug therapy: Secondary | ICD-10-CM

## 2016-09-29 DIAGNOSIS — I200 Unstable angina: Secondary | ICD-10-CM | POA: Diagnosis present

## 2016-09-29 LAB — BASIC METABOLIC PANEL
Anion gap: 8 (ref 5–15)
BUN: 19 mg/dL (ref 6–20)
CHLORIDE: 104 mmol/L (ref 101–111)
CO2: 25 mmol/L (ref 22–32)
CREATININE: 0.87 mg/dL (ref 0.44–1.00)
Calcium: 8.9 mg/dL (ref 8.9–10.3)
GFR calc Af Amer: >60 mL/min (ref >60)
GFR calc non Af Amer: >60 mL/min (ref >60)
Glucose, Bld: 178 mg/dL — ABNORMAL HIGH (ref 65–99)
POTASSIUM: 3.6 mmol/L (ref 3.5–5.1)
SODIUM: 137 mmol/L (ref 135–145)

## 2016-09-29 LAB — CBC WITH DIFFERENTIAL/PLATELET
BASOS PCT: 1 %
Basophils Absolute: 0 10*3/uL (ref 0–0.1)
EOS ABS: 0.3 10*3/uL (ref 0–0.7)
Eosinophils Relative: 4 %
HEMATOCRIT: 38.4 % (ref 35.0–47.0)
HEMOGLOBIN: 12.7 g/dL (ref 12.0–16.0)
LYMPHS ABS: 2 10*3/uL (ref 1.0–3.6)
Lymphocytes Relative: 25 %
MCH: 30 pg (ref 26.0–34.0)
MCHC: 33.2 g/dL (ref 32.0–36.0)
MCV: 90.4 fL (ref 80.0–100.0)
Monocytes Absolute: 0.8 10*3/uL (ref 0.2–0.9)
Monocytes Relative: 10 %
NEUTROS ABS: 4.9 10*3/uL (ref 1.4–6.5)
NEUTROS PCT: 60 %
Platelets: 239 10*3/uL (ref 150–440)
RBC: 4.24 MIL/uL (ref 3.80–5.20)
RDW: 13.2 % (ref 11.5–14.5)
WBC: 8.1 10*3/uL (ref 3.6–11.0)

## 2016-09-29 LAB — GLUCOSE, CAPILLARY: Glucose-Capillary: 199 mg/dL — ABNORMAL HIGH (ref 65–99)

## 2016-09-29 LAB — TROPONIN I: Troponin I: <0.03 ng/mL (ref <0.03)

## 2016-09-29 MED ORDER — HYDRALAZINE HCL 20 MG/ML IJ SOLN
10.0000 mg | Freq: Four times a day (QID) | INTRAMUSCULAR | Status: DC | PRN
  Administered 2016-10-01 (×2): 10 mg via INTRAVENOUS
  Filled 2016-09-29 (×2): qty 1

## 2016-09-29 MED ORDER — HYDROCHLOROTHIAZIDE 25 MG PO TABS
25.0000 mg | ORAL_TABLET | Freq: Every day | ORAL | Status: DC
  Administered 2016-09-30 – 2016-10-04 (×5): 25 mg via ORAL
  Filled 2016-09-29 (×6): qty 1

## 2016-09-29 MED ORDER — NITROGLYCERIN 2 % TD OINT
1.0000 [in_us] | TOPICAL_OINTMENT | Freq: Once | TRANSDERMAL | Status: AC
  Administered 2016-09-29: 1 [in_us] via TOPICAL
  Filled 2016-09-29: qty 1

## 2016-09-29 MED ORDER — CLONIDINE HCL 0.1 MG PO TABS
ORAL_TABLET | ORAL | Status: AC
  Administered 2016-09-29: 0.2 mg via ORAL
  Filled 2016-09-29: qty 2

## 2016-09-29 MED ORDER — ACETAMINOPHEN 650 MG RE SUPP: 650.0000 mg | Freq: Four times a day (QID) | RECTAL | Status: DC | PRN

## 2016-09-29 MED ORDER — CLONIDINE HCL 0.1 MG PO TABS
0.2000 mg | ORAL_TABLET | Freq: Once | ORAL | Status: AC
  Administered 2016-09-29: 0.2 mg via ORAL

## 2016-09-29 MED ORDER — CITALOPRAM HYDROBROMIDE 20 MG PO TABS
20.0000 mg | ORAL_TABLET | Freq: Every day | ORAL | Status: DC
  Administered 2016-09-30 – 2016-10-04 (×5): 20 mg via ORAL
  Filled 2016-09-29 (×5): qty 1

## 2016-09-29 MED ORDER — ISOSORBIDE MONONITRATE ER 30 MG PO TB24
30.0000 mg | ORAL_TABLET | Freq: Every day | ORAL | Status: DC
  Administered 2016-09-30 – 2016-10-04 (×5): 30 mg via ORAL
  Filled 2016-09-29 (×6): qty 1

## 2016-09-29 MED ORDER — ACETAMINOPHEN 325 MG PO TABS
650.0000 mg | ORAL_TABLET | Freq: Four times a day (QID) | ORAL | Status: DC | PRN
  Administered 2016-09-29 – 2016-10-01 (×3): 650 mg via ORAL
  Filled 2016-09-29 (×4): qty 2

## 2016-09-29 MED ORDER — ACETAMINOPHEN 325 MG PO TABS
ORAL_TABLET | ORAL | Status: AC
  Administered 2016-09-29: 650 mg via ORAL
  Filled 2016-09-29: qty 2

## 2016-09-29 MED ORDER — INSULIN ASPART 100 UNIT/ML ~~LOC~~ SOLN
0.0000 [IU] | Freq: Three times a day (TID) | SUBCUTANEOUS | Status: DC
  Administered 2016-09-30: 11 [IU] via SUBCUTANEOUS
  Administered 2016-09-30: 5 [IU] via SUBCUTANEOUS
  Administered 2016-10-02: 15 [IU] via SUBCUTANEOUS
  Administered 2016-10-02: 3 [IU] via SUBCUTANEOUS
  Filled 2016-09-29: qty 15
  Filled 2016-09-29: qty 3
  Filled 2016-09-29: qty 11
  Filled 2016-09-29: qty 5

## 2016-09-29 MED ORDER — POLYETHYLENE GLYCOL 3350 17 G PO PACK
17.0000 g | PACK | Freq: Every day | ORAL | Status: DC
  Administered 2016-10-01 – 2016-10-04 (×4): 17 g via ORAL
  Filled 2016-09-29 (×4): qty 1

## 2016-09-29 MED ORDER — INSULIN GLARGINE 100 UNIT/ML ~~LOC~~ SOLN
40.0000 [IU] | Freq: Every day | SUBCUTANEOUS | Status: DC
  Administered 2016-09-29 – 2016-10-03 (×5): 40 [IU] via SUBCUTANEOUS
  Filled 2016-09-29 (×6): qty 0.4

## 2016-09-29 MED ORDER — HYDRALAZINE HCL 20 MG/ML IJ SOLN
INTRAMUSCULAR | Status: AC
  Administered 2016-09-29: 20 mg via INTRAVENOUS
  Filled 2016-09-29: qty 1

## 2016-09-29 MED ORDER — INSULIN ASPART 100 UNIT/ML ~~LOC~~ SOLN
0.0000 [IU] | Freq: Every day | SUBCUTANEOUS | Status: DC
  Administered 2016-09-30: 2 [IU] via SUBCUTANEOUS
  Filled 2016-09-29: qty 2

## 2016-09-29 MED ORDER — ENOXAPARIN SODIUM 40 MG/0.4ML ~~LOC~~ SOLN
40.0000 mg | SUBCUTANEOUS | Status: DC
Start: 2016-09-29 — End: 2016-09-30
  Administered 2016-09-29: 40 mg via SUBCUTANEOUS
  Filled 2016-09-29: qty 0.4

## 2016-09-29 MED ORDER — HYDRALAZINE HCL 50 MG PO TABS: 50.0000 mg | ORAL_TABLET | Freq: Once | ORAL | Status: DC

## 2016-09-29 MED ORDER — ONDANSETRON HCL 4 MG PO TABS: 4.0000 mg | ORAL_TABLET | Freq: Four times a day (QID) | ORAL | Status: DC | PRN

## 2016-09-29 MED ORDER — SODIUM CHLORIDE 0.9% FLUSH
3.0000 mL | Freq: Two times a day (BID) | INTRAVENOUS | Status: DC
  Administered 2016-09-29 – 2016-10-04 (×5): 3 mL via INTRAVENOUS

## 2016-09-29 MED ORDER — TIMOLOL MALEATE 0.5 % OP SOLN
1.0000 [drp] | Freq: Every day | OPHTHALMIC | Status: DC
  Administered 2016-09-30 – 2016-10-04 (×5): 1 [drp] via OPHTHALMIC
  Filled 2016-09-29: qty 5

## 2016-09-29 MED ORDER — HYDRALAZINE HCL 20 MG/ML IJ SOLN
20.0000 mg | Freq: Once | INTRAMUSCULAR | Status: AC
  Administered 2016-09-29: 20 mg via INTRAVENOUS

## 2016-09-29 MED ORDER — PRAVASTATIN SODIUM 20 MG PO TABS
20.0000 mg | ORAL_TABLET | Freq: Every day | ORAL | Status: DC
  Administered 2016-09-30 – 2016-10-03 (×4): 20 mg via ORAL
  Filled 2016-09-29 (×4): qty 1

## 2016-09-29 MED ORDER — ONDANSETRON HCL 4 MG/2ML IJ SOLN
4.0000 mg | Freq: Four times a day (QID) | INTRAMUSCULAR | Status: DC | PRN
  Administered 2016-10-01 – 2016-10-03 (×3): 4 mg via INTRAVENOUS
  Filled 2016-09-29 (×3): qty 2

## 2016-09-29 MED ORDER — ASPIRIN EC 81 MG PO TBEC
81.0000 mg | DELAYED_RELEASE_TABLET | Freq: Every day | ORAL | Status: DC
  Administered 2016-09-30 – 2016-10-04 (×4): 81 mg via ORAL
  Filled 2016-09-29 (×4): qty 1

## 2016-09-29 MED ORDER — KETOROLAC TROMETHAMINE 30 MG/ML IJ SOLN
15.0000 mg | Freq: Once | INTRAMUSCULAR | Status: AC
  Administered 2016-09-29: 15 mg via INTRAVENOUS
  Filled 2016-09-29: qty 1

## 2016-09-29 MED ORDER — ACETAMINOPHEN 325 MG PO TABS
650.0000 mg | ORAL_TABLET | Freq: Once | ORAL | Status: AC
  Administered 2016-09-29: 650 mg via ORAL

## 2016-09-29 MED ORDER — PANTOPRAZOLE SODIUM 40 MG PO TBEC
40.0000 mg | DELAYED_RELEASE_TABLET | Freq: Every day | ORAL | Status: DC
  Administered 2016-10-01 – 2016-10-04 (×4): 40 mg via ORAL
  Filled 2016-09-29 (×4): qty 1

## 2016-09-29 MED ORDER — QUINAPRIL HCL 10 MG PO TABS
20.0000 mg | ORAL_TABLET | Freq: Every day | ORAL | Status: DC
  Administered 2016-09-30 – 2016-10-04 (×5): 20 mg via ORAL
  Filled 2016-09-29 (×5): qty 2

## 2016-09-29 MED ORDER — MAGNESIUM OXIDE 400 (241.3 MG) MG PO TABS
400.0000 mg | ORAL_TABLET | Freq: Every day | ORAL | Status: DC
  Administered 2016-09-30 – 2016-10-04 (×5): 400 mg via ORAL
  Filled 2016-09-29 (×5): qty 1

## 2016-09-29 MED ORDER — LORATADINE 10 MG PO TABS
10.0000 mg | ORAL_TABLET | Freq: Every day | ORAL | Status: DC
  Administered 2016-09-30 – 2016-10-04 (×5): 10 mg via ORAL
  Filled 2016-09-29 (×5): qty 1

## 2016-09-29 MED ORDER — INFLUENZA VAC SPLIT QUAD 0.5 ML IM SUSY
0.5000 mL | PREFILLED_SYRINGE | INTRAMUSCULAR | Status: AC
  Administered 2016-10-01: 0.5 mL via INTRAMUSCULAR
  Filled 2016-09-29: qty 0.5

## 2016-09-29 MED ORDER — BRIMONIDINE TARTRATE-TIMOLOL 0.2-0.5 % OP SOLN: 1.0000 [drp] | Freq: Every day | OPHTHALMIC | Status: DC

## 2016-09-29 MED ORDER — LABETALOL HCL 200 MG PO TABS
100.0000 mg | ORAL_TABLET | Freq: Two times a day (BID) | ORAL | Status: DC
  Administered 2016-09-29 – 2016-10-01 (×4): 100 mg via ORAL
  Filled 2016-09-29: qty 1
  Filled 2016-09-29: qty 2
  Filled 2016-09-29: qty 1
  Filled 2016-09-29: qty 2

## 2016-09-29 MED ORDER — BRIMONIDINE TARTRATE 0.2 % OP SOLN
1.0000 [drp] | Freq: Every day | OPHTHALMIC | Status: DC
  Administered 2016-09-30 – 2016-10-04 (×5): 1 [drp] via OPHTHALMIC
  Filled 2016-09-29: qty 5

## 2016-09-29 NOTE — ED Notes (Signed)
Pt c/o chest pain and right leg cramping, States pain is 10/10. Pt states when her leg cramps she gets chest pain. No swelling, redness or tenderness noted to right calf.
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
## 2016-09-29 NOTE — ED Notes (Signed)
Report to Kat, RN.

## 2016-09-29 NOTE — ED Triage Notes (Signed)
discussed pt with dr Pershing Proudschaevitz. Imaging ordered.

## 2016-09-29 NOTE — ED Notes (Addendum)
Pt states she has had similar headache in the past and was given a spray for her nose to help with head pain but is unsure why.  Pt states she has taken some Tylenol in the past for headache but none today. Pt also states she has had fluid coming from both ears, worse on the right.

## 2016-09-29 NOTE — Care Management Obs Status (Signed)
MEDICARE OBSERVATION STATUS NOTIFICATION   Patient Details  Name: Vicki [name] MRN: [id number] Date of Birth: 1941/01/15   Medicare Observation Status Notification Given:  Yes    CrutchfieldDerrill Memo, Annaliese Saez M, RN 09/29/2016, 7:40 PM
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
## 2016-09-30 LAB — BASIC METABOLIC PANEL
Anion gap: 6 (ref 5–15)
BUN: 24 mg/dL — AB (ref 6–20)
CHLORIDE: 104 mmol/L (ref 101–111)
CO2: 26 mmol/L (ref 22–32)
CREATININE: 0.93 mg/dL (ref 0.44–1.00)
Calcium: 8.6 mg/dL — ABNORMAL LOW (ref 8.9–10.3)
GFR calc Af Amer: >60 mL/min (ref >60)
GFR calc non Af Amer: 58 mL/min — ABNORMAL LOW (ref >60)
GLUCOSE: 319 mg/dL — AB (ref 65–99)
POTASSIUM: 3.3 mmol/L — AB (ref 3.5–5.1)
SODIUM: 136 mmol/L (ref 135–145)

## 2016-09-30 LAB — LIPID PANEL
CHOL/HDL RATIO: 2.6 ratio
Cholesterol: 184 mg/dL (ref 0–200)
HDL: 70 mg/dL (ref >40)
LDL Cholesterol: 88 mg/dL (ref 0–99)
Triglycerides: 132 mg/dL (ref <150)
VLDL: 26 mg/dL (ref 0–40)

## 2016-09-30 LAB — GLUCOSE, CAPILLARY
GLUCOSE-CAPILLARY: 176 mg/dL — AB (ref 65–99)
Glucose-Capillary: 249 mg/dL — ABNORMAL HIGH (ref 65–99)
Glucose-Capillary: 313 mg/dL — ABNORMAL HIGH (ref 65–99)
Glucose-Capillary: 244 mg/dL — ABNORMAL HIGH (ref 65–99)

## 2016-09-30 LAB — CBC
HEMATOCRIT: 37.4 % (ref 35.0–47.0)
Hemoglobin: 12.4 g/dL (ref 12.0–16.0)
MCH: 30.5 pg (ref 26.0–34.0)
MCHC: 33 g/dL (ref 32.0–36.0)
MCV: 92.6 fL (ref 80.0–100.0)
Platelets: 222 10*3/uL (ref 150–440)
RBC: 4.05 MIL/uL (ref 3.80–5.20)
RDW: 13 % (ref 11.5–14.5)
WBC: 5.2 10*3/uL (ref 3.6–11.0)

## 2016-09-30 LAB — TROPONIN I
Troponin I: 0.23 ng/mL (ref <0.03)
Troponin I: 0.69 ng/mL (ref <0.03)

## 2016-09-30 MED ORDER — SODIUM CHLORIDE 0.9 % IV SOLN: 250.0000 mL | INTRAVENOUS | Status: DC | PRN

## 2016-09-30 MED ORDER — SODIUM CHLORIDE 0.9 % WEIGHT BASED INFUSION
3.0000 mL/kg/h | INTRAVENOUS | Status: AC
  Administered 2016-10-01: 3 mL/kg/h via INTRAVENOUS

## 2016-09-30 MED ORDER — SODIUM CHLORIDE 0.9% FLUSH: 3.0000 mL | INTRAVENOUS | Status: DC | PRN

## 2016-09-30 MED ORDER — ASPIRIN 81 MG PO CHEW
81.0000 mg | CHEWABLE_TABLET | ORAL | Status: AC
  Administered 2016-10-01: 81 mg via ORAL
  Filled 2016-09-30: qty 1

## 2016-09-30 MED ORDER — SODIUM CHLORIDE 0.9% FLUSH
3.0000 mL | Freq: Two times a day (BID) | INTRAVENOUS | Status: DC
  Administered 2016-09-30 – 2016-10-01 (×2): 3 mL via INTRAVENOUS

## 2016-09-30 MED ORDER — HEPARIN (PORCINE) IN NACL 100-0.45 UNIT/ML-% IJ SOLN
950.0000 [IU]/h | INTRAMUSCULAR | Status: DC
  Administered 2016-09-30: 850 [IU]/h via INTRAVENOUS
  Filled 2016-09-30: qty 250

## 2016-09-30 MED ORDER — SODIUM CHLORIDE 0.9 % WEIGHT BASED INFUSION: 1.0000 mL/kg/h | INTRAVENOUS | Status: DC
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
## 2016-09-30 NOTE — Progress Notes (Signed)
Pt arrived from ED alert and oriented. No SOB, c/o of mild headache 4/10 pain relieved by PRN tylenol. Skin and telemetry box verified with Susa RaringLisa Rn. No skin issues, no concerns offered at this time.

## 2016-09-30 NOTE — Progress Notes (Addendum)
Troponin 0.69.  Dr. Juliann Paresallwood paged.    Possible cath later today, d/c lovenox and made patient NPO.

## 2016-09-30 NOTE — Progress Notes (Signed)
Troponin of 0.23 reported. Pt is asymptomatic and has no c/o pain.  MD Sheryle Hailiamond made aware. Will continue to monitor.

## 2016-09-30 NOTE — Progress Notes (Signed)
Chaplain visited patient in response to an order that the patient requested. Patient said that she was very sadden by her 78 year old son who passed away three years ago. Patient said that she would like to believe that she let it go and put everything in God's hands but it was not always the case.Chaplain advised patient to let her grieving process take its course rather than dismissing it. Chaplain prayed with patient before leaving her room.

## 2016-10-01 ENCOUNTER — Encounter
Admission: EM | Disposition: A | Discharge status: Home Health | Payer: Self-pay | Source: Home / Self Care | Attending: Internal Medicine

## 2016-10-01 ENCOUNTER — Encounter: Payer: Self-pay | Admitting: *Deleted

## 2016-10-01 DIAGNOSIS — Z23 Encounter for immunization: Secondary | ICD-10-CM | POA: Diagnosis present

## 2016-10-01 DIAGNOSIS — R51 Headache: Secondary | ICD-10-CM | POA: Diagnosis present

## 2016-10-01 DIAGNOSIS — Z8673 Personal history of transient ischemic attack (TIA), and cerebral infarction without residual deficits: Secondary | ICD-10-CM | POA: Diagnosis not present

## 2016-10-01 DIAGNOSIS — I214 Non-ST elevation (NSTEMI) myocardial infarction: Secondary | ICD-10-CM | POA: Diagnosis present

## 2016-10-01 DIAGNOSIS — Z833 Family history of diabetes mellitus: Secondary | ICD-10-CM | POA: Diagnosis not present

## 2016-10-01 DIAGNOSIS — Z794 Long term (current) use of insulin: Secondary | ICD-10-CM | POA: Diagnosis not present

## 2016-10-01 DIAGNOSIS — R079 Chest pain, unspecified: Secondary | ICD-10-CM | POA: Diagnosis present

## 2016-10-01 DIAGNOSIS — K5900 Constipation, unspecified: Secondary | ICD-10-CM | POA: Diagnosis present

## 2016-10-01 DIAGNOSIS — R509 Fever, unspecified: Secondary | ICD-10-CM | POA: Diagnosis present

## 2016-10-01 DIAGNOSIS — R112 Nausea with vomiting, unspecified: Secondary | ICD-10-CM | POA: Diagnosis not present

## 2016-10-01 DIAGNOSIS — K219 Gastro-esophageal reflux disease without esophagitis: Secondary | ICD-10-CM | POA: Diagnosis present

## 2016-10-01 DIAGNOSIS — Z8249 Family history of ischemic heart disease and other diseases of the circulatory system: Secondary | ICD-10-CM | POA: Diagnosis not present

## 2016-10-01 DIAGNOSIS — Z79899 Other long term (current) drug therapy: Secondary | ICD-10-CM | POA: Diagnosis not present

## 2016-10-01 DIAGNOSIS — I10 Essential (primary) hypertension: Secondary | ICD-10-CM | POA: Diagnosis present

## 2016-10-01 DIAGNOSIS — Z7982 Long term (current) use of aspirin: Secondary | ICD-10-CM | POA: Diagnosis not present

## 2016-10-01 DIAGNOSIS — I200 Unstable angina: Secondary | ICD-10-CM | POA: Diagnosis present

## 2016-10-01 DIAGNOSIS — E1151 Type 2 diabetes mellitus with diabetic peripheral angiopathy without gangrene: Secondary | ICD-10-CM | POA: Diagnosis present

## 2016-10-01 DIAGNOSIS — R011 Cardiac murmur, unspecified: Secondary | ICD-10-CM | POA: Diagnosis present

## 2016-10-01 HISTORY — PX: LEFT HEART CATH AND CORONARY ANGIOGRAPHY: CATH118249

## 2016-10-01 LAB — GLUCOSE, CAPILLARY
GLUCOSE-CAPILLARY: 101 mg/dL — AB (ref 65–99)
GLUCOSE-CAPILLARY: 96 mg/dL (ref 65–99)
GLUCOSE-CAPILLARY: 133 mg/dL — AB (ref 65–99)
Glucose-Capillary: 118 mg/dL — ABNORMAL HIGH (ref 65–99)

## 2016-10-01 LAB — HEPARIN LEVEL (UNFRACTIONATED)
HEPARIN UNFRACTIONATED: 0.86 [IU]/mL — AB (ref 0.30–0.70)
Heparin Unfractionated: <0.1 IU/mL — ABNORMAL LOW (ref 0.30–0.70)

## 2016-10-01 LAB — POTASSIUM: POTASSIUM: 3.9 mmol/L (ref 3.5–5.1)

## 2016-10-01 LAB — CBC
HEMATOCRIT: 38.7 % (ref 35.0–47.0)
Hemoglobin: 13.3 g/dL (ref 12.0–16.0)
MCH: 31.4 pg (ref 26.0–34.0)
MCHC: 34.3 g/dL (ref 32.0–36.0)
MCV: 91.3 fL (ref 80.0–100.0)
Platelets: 227 10*3/uL (ref 150–440)
RBC: 4.24 MIL/uL (ref 3.80–5.20)
RDW: 13.5 % (ref 11.5–14.5)
WBC: 6.2 10*3/uL (ref 3.6–11.0)

## 2016-10-01 LAB — PROTIME-INR
INR: 1.05
Prothrombin Time: 13.7 seconds (ref 11.4–15.2)

## 2016-10-01 SURGERY — LEFT HEART CATH AND CORONARY ANGIOGRAPHY
Anesthesia: Moderate Sedation

## 2016-10-01 MED ORDER — SODIUM CHLORIDE 0.9 % IV SOLN: 250.0000 mL | INTRAVENOUS | Status: DC | PRN

## 2016-10-01 MED ORDER — SODIUM CHLORIDE 0.9 % WEIGHT BASED INFUSION: 1.0000 mL/kg/h | INTRAVENOUS | Status: AC

## 2016-10-01 MED ORDER — HEPARIN (PORCINE) IN NACL 2-0.9 UNIT/ML-% IJ SOLN
INTRAMUSCULAR | Status: AC
  Filled 2016-10-01: qty 500

## 2016-10-01 MED ORDER — FENTANYL CITRATE (PF) 100 MCG/2ML IJ SOLN
INTRAMUSCULAR | Status: AC
  Filled 2016-10-01: qty 2

## 2016-10-01 MED ORDER — HEPARIN BOLUS VIA INFUSION
2100.0000 [IU] | Freq: Once | INTRAVENOUS | Status: DC
  Filled 2016-10-01: qty 2100

## 2016-10-01 MED ORDER — TRAMADOL HCL 50 MG PO TABS
50.0000 mg | ORAL_TABLET | Freq: Once | ORAL | Status: AC
Start: 2016-10-01 — End: 2016-10-01
  Administered 2016-10-01: 50 mg via ORAL
  Filled 2016-10-01: qty 1

## 2016-10-01 MED ORDER — LABETALOL HCL 200 MG PO TABS
300.0000 mg | ORAL_TABLET | Freq: Two times a day (BID) | ORAL | Status: DC
  Administered 2016-10-01: 300 mg via ORAL
  Filled 2016-10-01 (×2): qty 2

## 2016-10-01 MED ORDER — HYDRALAZINE HCL 50 MG PO TABS
50.0000 mg | ORAL_TABLET | Freq: Three times a day (TID) | ORAL | Status: DC
  Administered 2016-10-01 – 2016-10-02 (×3): 50 mg via ORAL
  Filled 2016-10-01 (×3): qty 1

## 2016-10-01 MED ORDER — FENTANYL CITRATE (PF) 100 MCG/2ML IJ SOLN
INTRAMUSCULAR | Status: DC | PRN
  Administered 2016-10-01: 25 ug via INTRAVENOUS

## 2016-10-01 MED ORDER — HYDROCHLOROTHIAZIDE 12.5 MG PO TABS: 12.5000 mg | ORAL_TABLET | Freq: Every day | ORAL | 0 refills | Status: DC

## 2016-10-01 MED ORDER — ACETAMINOPHEN 325 MG PO TABS
650.0000 mg | ORAL_TABLET | ORAL | Status: DC | PRN
  Administered 2016-10-01: 650 mg via ORAL

## 2016-10-01 MED ORDER — MIDAZOLAM HCL 2 MG/2ML IJ SOLN
INTRAMUSCULAR | Status: DC | PRN
  Administered 2016-10-01: 1 mg via INTRAVENOUS

## 2016-10-01 MED ORDER — MIDAZOLAM HCL 2 MG/2ML IJ SOLN
INTRAMUSCULAR | Status: AC
  Filled 2016-10-01: qty 2

## 2016-10-01 MED ORDER — DEXTROSE 50 % IV SOLN
INTRAVENOUS | Status: AC
  Filled 2016-10-01: qty 50

## 2016-10-01 MED ORDER — DEXTROSE 50 % IV SOLN
INTRAVENOUS | Status: DC | PRN
  Administered 2016-10-01: 1 via INTRAVENOUS

## 2016-10-01 MED ORDER — QUINAPRIL HCL 20 MG PO TABS: 40.0000 mg | ORAL_TABLET | Freq: Every day | ORAL | 0 refills | Status: DC

## 2016-10-01 MED ORDER — ONDANSETRON HCL 4 MG/2ML IJ SOLN: 4.0000 mg | Freq: Four times a day (QID) | INTRAMUSCULAR | Status: DC | PRN

## 2016-10-01 MED ORDER — SODIUM CHLORIDE 0.9% FLUSH: 3.0000 mL | INTRAVENOUS | Status: DC | PRN

## 2016-10-01 MED ORDER — SODIUM CHLORIDE 0.9% FLUSH
3.0000 mL | Freq: Two times a day (BID) | INTRAVENOUS | Status: DC
  Administered 2016-10-02 – 2016-10-04 (×5): 3 mL via INTRAVENOUS

## 2016-10-01 SURGICAL SUPPLY — 8 items
CATH 5FR JL4 DIAGNOSTIC (CATHETERS) ×6 IMPLANT
CATH INFINITI 5FR ANG PIGTAIL (CATHETERS) ×3 IMPLANT
CATH INFINITI JR4 5F (CATHETERS) ×3 IMPLANT
KIT MANI 3VAL PERCEP (MISCELLANEOUS) ×3 IMPLANT
NEEDLE PERC 18GX7CM (NEEDLE) ×3 IMPLANT
PACK CARDIAC CATH (CUSTOM PROCEDURE TRAY) ×3 IMPLANT
SHEATH AVANTI 5FR X 11CM (SHEATH) ×3 IMPLANT
WIRE EMERALD 3MM-J .035X150CM (WIRE) ×3 IMPLANT

## 2016-10-01 NOTE — Progress Notes (Signed)
Pt c/o nausea and vomiting, states that she "feels sick" will administer Zofran PRN and continue to monitor.

## 2016-10-01 NOTE — Progress Notes (Signed)
Pt c/o severe headache, BP is better, MD Allena KatzPatel made aware. Per MD, we Will continue to monitor pt for tonight.
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
## 2016-10-01 NOTE — Progress Notes (Signed)
Inpatient Diabetes Program Recommendations  AACE/ADA: New Consensus Statement on Inpatient Glycemic Control (2015)  Target Ranges:  Prepandial:   less than 140 mg/dL      Peak postprandial:   less than 180 mg/dL (1-2 hours)      Critically ill patients:  140 - 180 mg/dL   Lab Results  Component Value Date   GLUCAP 101 (H) 10/01/2016   HGBA1C 12.7 (H) 03/23/2016    Review of Glycemic Control Results for Vicki [name], Silverio LayROBERTA LEE (MRN [id number]) as of 10/01/2016 10:47  Ref. Range 09/30/2016 07:46 09/30/2016 12:00 09/30/2016 17:04 09/30/2016 21:24 10/01/2016 08:25  Glucose-Capillary Latest Ref Range: 65 - 99 mg/dL 409176 (H) 811249 (H) 914313 (H) 244 (H) 101 (H)   Diabetes history: Type 2 Outpatient Diabetes medications: Toujeo 40 units qhs, Humalog 7 units tid plus sliding scale Current orders for Inpatient glycemic control: Lantus 40 units qhs, Novolog 0-15 units tid, Novolog 0-5 units qhs  Inpatient Diabetes Program Recommendations:  Please consider decreasing Novolog correction to sensitive correction 0-9 units tid and add Novolog 5 units tid with meals (hold if patient eats less than 50%).  Adding mealtime insulin will ensure the patient receives insulin at each meal.   Susette RacerJulie Kristian Mogg, RN, BA, MHA, CDE Diabetes Coordinator Inpatient Diabetes Program  [phone] (Team Pager) [phone] [facility] Office) 10/01/2016 10:52 AM

## 2016-10-01 NOTE — Progress Notes (Signed)
Patient post cath, R groin stable, c/d/i. Patient's BP 211/84, complaining of 10/10 headache with no relief from PRN Tylenol. Scheduled and PRN Hydralazine given. Dr. Allena KatzPatel text paged and call paged multiple times, still waiting for response. Patient's sister updated on possible discharge later today. Will  continue to frequently monitor.

## 2016-10-01 NOTE — Plan of Care (Signed)
Problem: Pain Managment: Goal: General experience of comfort will improve Outcome: Progressing Patient had complaints of 7/10 headache, medicated with PRN Tylenol. Patient had moderate relief, current pain score 4/10. Patient's pain goal is 0/10. Will continue to monitor and reassess pain.

## 2016-10-01 NOTE — Progress Notes (Signed)
ANTICOAGULATION CONSULT NOTE -   Pharmacy Consult for Heparin Drip Indication: chest pain/ACS  No Known Allergies  Patient Measurements: Height: 5\' 5"  (165.1 cm) Weight: 157 lb 11.2 oz (71.5 kg) IBW/kg (Calculated) : 57 Heparin Dosing Weight: 71.3 kg  Vital Signs: Temp: 98 F (36.7 C) (02/27 0852) Temp Source: Oral (02/27 0852) BP: 229/81 (02/27 0852) Pulse Rate: 59 (02/27 0852)  Labs:  Recent Labs  09/29/16 1747 09/29/16 2130 09/30/16 0316 09/30/16 0933 10/01/16 0830  HGB 12.7  --  12.4  --  13.3  HCT 38.4  --  37.4  --  38.7  PLT 239  --  222  --  227  LABPROT  --   --   --   --  13.7  INR  --   --   --   --  1.05  HEPARINUNFRC  --   --   --   --  0.86*  CREATININE 0.87  --  0.93  --   --   TROPONINI <0.03 <0.03 0.23* 0.69*  --     Estimated Creatinine Clearance: 50.2 mL/min (by C-G formula based on SCr of 0.93 mg/dL).   Medical History: Past Medical History:  Diagnosis Date  . Diabetes mellitus without complication (HCC)   . GERD (gastroesophageal reflux disease)   . Hypertension   . Lesion of sciatic nerve, right side     Medications:  Scheduled:  . aspirin EC  81 mg Oral Daily  . brimonidine  1 drop Both Eyes Daily   And  . timolol  1 drop Both Eyes Daily  . citalopram  20 mg Oral Daily  . hydrochlorothiazide  25 mg Oral Daily  . Influenza vac split quadrivalent PF  0.5 mL Intramuscular Tomorrow-1000  . insulin aspart  0-15 Units Subcutaneous TID WC  . insulin aspart  0-5 Units Subcutaneous QHS  . insulin glargine  40 Units Subcutaneous QHS  . isosorbide mononitrate  30 mg Oral Daily  . labetalol  100 mg Oral BID  . loratadine  10 mg Oral Daily  . magnesium oxide  400 mg Oral Daily  . pantoprazole  40 mg Oral Daily  . polyethylene glycol  17 g Oral Daily  . pravastatin  20 mg Oral q1800  . quinapril  20 mg Oral Daily  . sodium chloride flush  3 mL Intravenous Q12H  . sodium chloride flush  3 mL Intravenous Q12H   Infusions:  . sodium  chloride    . heparin 850 Units/hr (09/30/16 1513)    Assessment: 76 yo F with Chest pain, NSTEMI. No Anticoagulants listed as home meds  Baseline Hgb 12.4  Plt 222   APTT     INR   Goal of Therapy:  Heparin level 0.3-0.7 units/ml Monitor platelets by anticoagulation protocol: Yes   Plan:  Start heparin infusion at 850 units/hr Check anti-Xa level in 8 hours and daily while on heparin Continue to monitor H&H and platelets   2/27:  Heparin level @ 0830= 0.86.  Will decrease Heparin drip to 700 units/hr for goal HL of 0.3-0.7.  Patient anticipated to go to Cath Lab this afternoon. Will recheck HL in 8 hrs at 1830 if Patient has not gone to Cath Lab   Mileah Hemmer A 10/01/2016,10:26 AM

## 2016-10-01 NOTE — Progress Notes (Signed)
Check right groin for bleeding or hematoma.  Patient will be on bedrest for 2 hours post sheath pull---out of bed at 16:30.  Bilateral pulses are 2's DP's..Marland Kitchen

## 2016-10-01 NOTE — Progress Notes (Signed)
ANTICOAGULATION CONSULT NOTE - Initial Consult  Pharmacy Consult for Heparin Drip Indication: chest pain/ACS  No Known Allergies  Patient Measurements: Height: 5\' 5"  (165.1 cm) Weight: 157 lb 11.2 oz (71.5 kg) IBW/kg (Calculated) : 57 Heparin Dosing Weight: 71.3 kg  Vital Signs: Temp: 98 F (36.7 C) (02/27 0436) Temp Source: Oral (02/27 0436) BP: 157/65 (02/27 0436) Pulse Rate: 62 (02/27 0436)  Labs:  Recent Labs  09/29/16 1747 09/29/16 2130 09/30/16 0316 09/30/16 0933  HGB 12.7  --  12.4  --   HCT 38.4  --  37.4  --   PLT 239  --  222  --   CREATININE 0.87  --  0.93  --   TROPONINI <0.03 <0.03 0.23* 0.69*    Estimated Creatinine Clearance: 50.2 mL/min (by C-G formula based on SCr of 0.93 mg/dL).   Medical History: Past Medical History:  Diagnosis Date  . Diabetes mellitus without complication (HCC)   . GERD (gastroesophageal reflux disease)   . Hypertension   . Lesion of sciatic nerve, right side     Medications:  Scheduled:  . aspirin EC  81 mg Oral Daily  . brimonidine  1 drop Both Eyes Daily   And  . timolol  1 drop Both Eyes Daily  . citalopram  20 mg Oral Daily  . hydrochlorothiazide  25 mg Oral Daily  . Influenza vac split quadrivalent PF  0.5 mL Intramuscular Tomorrow-1000  . insulin aspart  0-15 Units Subcutaneous TID WC  . insulin aspart  0-5 Units Subcutaneous QHS  . insulin glargine  40 Units Subcutaneous QHS  . isosorbide mononitrate  30 mg Oral Daily  . labetalol  100 mg Oral BID  . loratadine  10 mg Oral Daily  . magnesium oxide  400 mg Oral Daily  . pantoprazole  40 mg Oral Daily  . polyethylene glycol  17 g Oral Daily  . pravastatin  20 mg Oral q1800  . quinapril  20 mg Oral Daily  . sodium chloride flush  3 mL Intravenous Q12H  . sodium chloride flush  3 mL Intravenous Q12H   Infusions:  . sodium chloride    . heparin 850 Units/hr (09/30/16 1513)    Assessment: 76 yo F with Chest pain, NSTEMI. No Anticoagulants listed as  home meds  Baseline Hgb 12.4  Plt 222   APTT     INR   Goal of Therapy:  Heparin level 0.3-0.7 units/ml Monitor platelets by anticoagulation protocol: Yes   Plan:  Start heparin infusion at 850 units/hr Check anti-Xa level in 8 hours and daily while on heparin Continue to monitor H&H and platelets   2/27:  Will order Heparin level for 0830.  Patient anticipated to go to Cath Lab this afternoon.   Zyon Rosser A 10/01/2016,8:30 AM

## 2016-10-01 NOTE — Care Management (Signed)
Observation for chest pain.  Awaiting cath results. No discharge needs identified by members of the care team at present.
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
## 2016-10-01 NOTE — Progress Notes (Signed)
MD notified. Pt still complains of headache after 2 doses of prn tylenol. Orders for one time dose of tramadol. I will continue to assess.

## 2016-10-02 LAB — GLUCOSE, CAPILLARY
GLUCOSE-CAPILLARY: 384 mg/dL — AB (ref 65–99)
GLUCOSE-CAPILLARY: 322 mg/dL — AB (ref 65–99)
Glucose-Capillary: 66 mg/dL (ref 65–99)
Glucose-Capillary: 183 mg/dL — ABNORMAL HIGH (ref 65–99)
Glucose-Capillary: 200 mg/dL — ABNORMAL HIGH (ref 65–99)

## 2016-10-02 LAB — URINALYSIS, ROUTINE W REFLEX MICROSCOPIC
BILIRUBIN URINE: NEGATIVE
Glucose, UA: NEGATIVE mg/dL
Hgb urine dipstick: NEGATIVE
KETONES UR: 20 mg/dL — AB
Nitrite: NEGATIVE
PH: 5 (ref 5.0–8.0)
Protein, ur: NEGATIVE mg/dL
Specific Gravity, Urine: 1.029 (ref 1.005–1.030)

## 2016-10-02 LAB — INFLUENZA PANEL BY PCR (TYPE A & B)
Influenza A By PCR: NEGATIVE
Influenza B By PCR: NEGATIVE

## 2016-10-02 LAB — CARDIAC CATHETERIZATION: CATHEFQUANT: 55 %

## 2016-10-02 MED ORDER — INSULIN ASPART 100 UNIT/ML ~~LOC~~ SOLN
0.0000 [IU] | Freq: Every day | SUBCUTANEOUS | Status: DC
  Administered 2016-10-02: 4 [IU] via SUBCUTANEOUS
  Filled 2016-10-02: qty 4

## 2016-10-02 MED ORDER — DEXTROSE 5 % IV SOLN
1.0000 g | Freq: Once | INTRAVENOUS | Status: AC
  Administered 2016-10-02: 1 g via INTRAVENOUS
  Filled 2016-10-02 (×2): qty 10

## 2016-10-02 MED ORDER — ACETAMINOPHEN 325 MG PO TABS: 650.0000 mg | ORAL_TABLET | ORAL | Status: DC | PRN

## 2016-10-02 MED ORDER — LABETALOL HCL 100 MG PO TABS
100.0000 mg | ORAL_TABLET | Freq: Two times a day (BID) | ORAL | Status: DC
  Administered 2016-10-02 – 2016-10-04 (×4): 100 mg via ORAL
  Filled 2016-10-02 (×5): qty 1

## 2016-10-02 MED ORDER — INSULIN ASPART 100 UNIT/ML ~~LOC~~ SOLN
5.0000 [IU] | Freq: Three times a day (TID) | SUBCUTANEOUS | Status: DC
  Administered 2016-10-02 – 2016-10-03 (×3): 5 [IU] via SUBCUTANEOUS
  Filled 2016-10-02 (×3): qty 5

## 2016-10-02 MED ORDER — INSULIN ASPART 100 UNIT/ML ~~LOC~~ SOLN
0.0000 [IU] | Freq: Three times a day (TID) | SUBCUTANEOUS | Status: DC
  Administered 2016-10-02: 3 [IU] via SUBCUTANEOUS
  Administered 2016-10-03: 8 [IU] via SUBCUTANEOUS
  Administered 2016-10-03: 5 [IU] via SUBCUTANEOUS
  Administered 2016-10-04: 3 [IU] via SUBCUTANEOUS
  Filled 2016-10-02: qty 3
  Filled 2016-10-02 (×2): qty 5
  Filled 2016-10-02: qty 8

## 2016-10-02 MED ORDER — HYDRALAZINE HCL 25 MG PO TABS
25.0000 mg | ORAL_TABLET | Freq: Three times a day (TID) | ORAL | Status: DC
  Administered 2016-10-02 – 2016-10-04 (×5): 25 mg via ORAL
  Filled 2016-10-02 (×6): qty 1

## 2016-10-02 MED ORDER — CEFTRIAXONE SODIUM-DEXTROSE 1-3.74 GM-% IV SOLR: 1.0000 g | Freq: Once | INTRAVENOUS | Status: DC
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
## 2016-10-02 NOTE — Progress Notes (Signed)
Inpatient Diabetes Program Recommendations  AACE/ADA: New Consensus Statement on Inpatient Glycemic Control (2015)  Target Ranges:  Prepandial:   less than 140 mg/dL      Peak postprandial:   less than 180 mg/dL (1-2 hours)      Critically ill patients:  140 - 180 mg/dL  Results for Vicki [name], Vicki [name] (MRN [id number]) as of 10/02/2016 13:12  Ref. Range 10/01/2016 08:25 10/01/2016 11:48 10/01/2016 13:35 10/01/2016 16:52 10/01/2016 21:08 10/02/2016 07:26 10/02/2016 11:36  Glucose-Capillary Latest Ref Range: 65 - 99 mg/dL 308101 (H) 96 66 657118 (H) 846133 (H) 183 (H) 384 (H)    Review of Glycemic Control  Current orders for Inpatient glycemic control: Lantus 40 units QHS, Novolog 0-15 units TID with meals, Novolog 0-5 units QHS  Inpatient Diabetes Program Recommendations: Insulin - Meal Coverage: Please consider ordering Novolog 5 units TID with meals if patient eats at least 50% of meals.  NOTE: Patient was NPO on 10/01/16 and CBGs ranged from 66-133 mg/dl. However, when patient eats postprandial glucose is consistently elevated and patient would benefit from meal coverage.  Thanks, Orlando PennerMarie Jase Reep, RN, MSN, CDE Diabetes Coordinator Inpatient Diabetes Program [phone] (Team Pager from 8am to 5pm)

## 2016-10-03 ENCOUNTER — Inpatient Hospital Stay: Payer: Medicare PPO

## 2016-10-03 ENCOUNTER — Encounter: Payer: Self-pay | Admitting: Internal Medicine

## 2016-10-03 LAB — GLUCOSE, CAPILLARY
GLUCOSE-CAPILLARY: 275 mg/dL — AB (ref 65–99)
GLUCOSE-CAPILLARY: 243 mg/dL — AB (ref 65–99)
GLUCOSE-CAPILLARY: 147 mg/dL — AB (ref 65–99)
Glucose-Capillary: 295 mg/dL — ABNORMAL HIGH (ref 65–99)
Glucose-Capillary: 110 mg/dL — ABNORMAL HIGH (ref 65–99)

## 2016-10-03 LAB — URINE CULTURE: Culture: NO GROWTH

## 2016-10-03 MED ORDER — CEFUROXIME AXETIL 500 MG PO TABS: 500.0000 mg | ORAL_TABLET | Freq: Two times a day (BID) | ORAL | 0 refills | Status: AC

## 2016-10-03 MED ORDER — ONDANSETRON HCL 4 MG PO TABS: 4.0000 mg | ORAL_TABLET | Freq: Three times a day (TID) | ORAL | 0 refills | Status: DC | PRN

## 2016-10-03 MED ORDER — INSULIN ASPART 100 UNIT/ML ~~LOC~~ SOLN
8.0000 [IU] | Freq: Three times a day (TID) | SUBCUTANEOUS | Status: DC
  Administered 2016-10-03 – 2016-10-04 (×3): 8 [IU] via SUBCUTANEOUS
  Filled 2016-10-03 (×3): qty 8

## 2016-10-03 MED ORDER — PROMETHAZINE HCL 25 MG/ML IJ SOLN
12.5000 mg | Freq: Four times a day (QID) | INTRAMUSCULAR | Status: DC | PRN
  Filled 2016-10-03: qty 1

## 2016-10-03 MED ORDER — IOPAMIDOL (ISOVUE-300) INJECTION 61%: 15.0000 mL | INTRAVENOUS | Status: DC

## 2016-10-03 MED ORDER — HYDRALAZINE HCL 25 MG PO TABS: 25.0000 mg | ORAL_TABLET | Freq: Three times a day (TID) | ORAL | 0 refills | Status: DC

## 2016-10-03 MED ORDER — HYDROCHLOROTHIAZIDE 12.5 MG PO TABS: 12.5000 mg | ORAL_TABLET | Freq: Every day | ORAL | 0 refills | Status: DC

## 2016-10-03 NOTE — Care Management Important Message (Signed)
Important Message  Patient Details  Name: Vicki HippsRoberta Lee Schue MRN: [id number] Date of Birth: 1940-11-19   Medicare Important Message Given:  Yes Initial signed IM printed from Epic and given to patient.    Eber HongGreene, Aubery Douthat R, RN 10/03/2016, 2:11 PM

## 2016-10-03 NOTE — Progress Notes (Signed)
Pt suddenly feeling nauseous and had episode of vomiting. Zofran given, Dr Allena KatzPatel notified. MD acknowledged and ordered abdominal CT

## 2016-10-03 NOTE — Progress Notes (Signed)
No complaint of headache,no nausea/vomiting

## 2016-10-03 NOTE — Progress Notes (Addendum)
Inpatient Diabetes Program Recommendations  AACE/ADA: New Consensus Statement on Inpatient Glycemic Control (2015)  Target Ranges:  Prepandial:   less than 140 mg/dL      Peak postprandial:   less than 180 mg/dL (1-2 hours)      Critically ill patients:  140 - 180 mg/dL  Results for [name], Vicki [name] (MRN [id number]) as of 10/03/2016 10:04  Ref. Range 10/02/2016 07:26 10/02/2016 11:36 10/02/2016 16:40 10/02/2016 21:12 10/03/2016 07:30  Glucose-Capillary Latest Ref Range: 65 - 99 mg/dL 409183 (H) 811384 (H) 914200 (H) 322 (H) 295 (H)    Review of Glycemic Control  Current orders for Inpatient glycemic control: Lantus 40 units QHS, Novolog 0-15 units TID with meals, Novolog 0-5 units QHS, Novolog 5 units TID with meals for meal coverage  Inpatient Diabetes Program Recommendations: Insulin - Meal Coverage: Please consider increasing meal coverage to Novolog 8 units TID with meals if patient eats at least 50% of meals.  Thanks, Orlando PennerMarie Wilver Tignor, RN, MSN, CDE Diabetes Coordinator Inpatient Diabetes Program [phone] (Team Pager from 8am to 5pm)

## 2016-10-03 NOTE — Care Management (Signed)
Physical therapy is recommending home health psychical therapy. has access to a walker.  No agency preference.  [facility]erral to [facility] for [facility] and possibly nursing if patient's gi issues continue.

## 2016-10-04 LAB — GLUCOSE, CAPILLARY
Glucose-Capillary: 118 mg/dL — ABNORMAL HIGH (ref 65–99)
Glucose-Capillary: 185 mg/dL — ABNORMAL HIGH (ref 65–99)

## 2016-10-04 MED ORDER — POLYETHYLENE GLYCOL 3350 17 G PO PACK: 17.0000 g | PACK | Freq: Every day | ORAL | Status: DC

## 2016-10-04 MED ORDER — MAGNESIUM CITRATE PO SOLN
1.0000 | Freq: Once | ORAL | Status: AC
  Administered 2016-10-04: 1 via ORAL
  Filled 2016-10-04: qty 296

## 2016-10-04 MED ORDER — DOCUSATE SODIUM 100 MG PO CAPS: 100.0000 mg | ORAL_CAPSULE | Freq: Two times a day (BID) | ORAL | 0 refills | Status: DC

## 2016-10-04 MED ORDER — POLYETHYLENE GLYCOL 3350 17 G PO PACK: 17.0000 g | PACK | Freq: Every day | ORAL | 0 refills | Status: DC

## 2016-10-04 MED ORDER — DOCUSATE SODIUM 100 MG PO CAPS
100.0000 mg | ORAL_CAPSULE | Freq: Two times a day (BID) | ORAL | Status: DC
  Administered 2016-10-04: 100 mg via ORAL
  Filled 2016-10-04: qty 1
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
## 2016-10-04 NOTE — Progress Notes (Signed)
Patient discharged via wheelchair and private vehicle. IV removed and catheter intact. All discharge instructions given and patient verbalizes understanding. Tele removed and returned. No prescriptions given to patient No distress noted.   

## 2016-10-04 NOTE — Discharge Instructions (Addendum)
Hydralazine tablets What is this medicine? HYDRALAZINE (hye DRAL a zeen) is a type of vasodilator. It relaxes blood vessels, increasing the blood and oxygen supply to your heart. This medicine is used to treat high blood pressure. This medicine may be used for other purposes; ask your health care provider or pharmacist if you have questions. COMMON BRAND NAME(S): Apresoline What should I tell my health care provider before I take this medicine? They need to know if you have any of these conditions: -blood vessel disease -heart disease including angina or history of heart attack -kidney or liver disease -systemic lupus erythematosus (SLE) -an unusual or allergic reaction to hydralazine, tartrazine dye, other medicines, foods, dyes, or preservatives -pregnant or trying to get pregnant -breast-feeding How should I use this medicine? Take this medicine by mouth with a glass of water. Follow the directions on the prescription label. Take your doses at regular intervals. Do not take your medicine more often than directed. Do not stop taking except on the advice of your doctor or health care professional. Talk to your pediatrician regarding the use of this medicine in children. Special care may be needed. While this drug may be prescribed for children for selected conditions, precautions do apply. Overdosage: If you think you have taken too much of this medicine contact a poison control center or emergency room at once. NOTE: This medicine is only for you. Do not share this medicine with others. What if I miss a dose? If you miss a dose, take it as soon as you can. If it is almost time for your next dose, take only that dose. Do not take double or extra doses. What may interact with this medicine? -medicines for high blood pressure -medicines for mental depression This list may not describe all possible interactions. Give your health care provider a list of all the medicines, herbs, non-prescription  drugs, or dietary supplements you use. Also tell them if you smoke, drink alcohol, or use illegal drugs. Some items may interact with your medicine. What should I watch for while using this medicine? Visit your doctor or health care professional for regular checks on your progress. Check your blood pressure and pulse rate regularly. Ask your doctor or health care professional what your blood pressure and pulse rate should be and when you should contact him or her. You may get drowsy or dizzy. Do not drive, use machinery, or do anything that needs mental alertness until you know how this medicine affects you. Do not stand or sit up quickly, especially if you are an older patient. This reduces the risk of dizzy or fainting spells. Alcohol may interfere with the effect of this medicine. Avoid alcoholic drinks. Do not treat yourself for coughs, colds, or pain while you are taking this medicine without asking your doctor or health care professional for advice. Some ingredients may increase your blood pressure. What side effects may I notice from receiving this medicine? Side effects that you should report to your doctor or health care professional as soon as possible: -chest pain, or fast or irregular heartbeat -fever, chills, or sore throat -numbness or tingling in the hands or feet -shortness of breath -skin rash, redness, blisters or itching -stiff or swollen joints -sudden weight gain -swelling of the feet or legs -swollen lymph glands -unusual weakness Side effects that usually do not require medical attention (report to your doctor or health care professional if they continue or are bothersome): -diarrhea, or constipation -headache -loss of appetite -nausea, vomiting  This list may not describe all possible side effects. Call your doctor for medical advice about side effects. You may report side effects to FDA at 1-800-FDA-1088. Where should I keep my medicine? Keep out of the reach of  children. Store at room temperature between 15 and 30 degrees C (59 and 86 degrees F). Throw away any unused medicine after the expiration date. NOTE: This sheet is a summary. It may not cover all possible information. If you have questions about this medicine, talk to your doctor, pharmacist, or health care provider.  2018 Elsevier/Gold Standard (2007-12-04 15:44:58) Cefuroxime tablets What is this medicine? CEFUROXIME (se fyoor OX eem) is a cephalosporin antibiotic. It is used to treat certain kinds of bacterial infections. It will not work for colds, flu, or other viral infections. This medicine may be used for other purposes; ask your health care provider or pharmacist if you have questions. COMMON BRAND NAME(S): Alti-Cefuroxime, Ceftin What should I tell my health care provider before I take this medicine? They need to know if you have any of these conditions: -bleeding problems -bowel disease, like colitis -kidney disease -liver disease -an unusual or allergic reaction to cefuroxime, other antibiotics or medicines, foods, dyes or preservatives -pregnant or trying to get pregnant -breast-feeding How should I use this medicine? Take this medicine by mouth with a full glass of water. Follow the directions on the prescription label. Do not crush or chew. This medicine works best if you take it with food. Take your medicine at regular intervals. Do not take your medicine more often than directed. Take all of your medicine as directed even if you think your are better. Do not skip doses or stop your medicine early. Talk to your pediatrician regarding the use of this medicine in children. Special care may be needed. While this drug may be prescribed for children as young as 58 months of age for selected conditions, precautions do apply. Overdosage: If you think you have taken too much of this medicine contact a poison control center or emergency room at once. NOTE: This medicine is only for you.  Do not share this medicine with others. What if I miss a dose? If you miss a dose, take it as soon as you can. If it is almost time for your next dose, take only that dose. Do not take double or extra doses. What may interact with this medicine? This medicine may interact with the following medications: -antacids -birth control pills -certain medicines for infection like amikacin, gentamicin, tobramycin -diuretics -probenecid -warfarin This list may not describe all possible interactions. Give your health care provider a list of all the medicines, herbs, non-prescription drugs, or dietary supplements you use. Also tell them if you smoke, drink alcohol, or use illegal drugs. Some items may interact with your medicine. What should I watch for while using this medicine? Tell your doctor or health care professional if your symptoms do not improve or if you get new symptoms. Do not treat diarrhea with over the counter products. Contact your doctor if you have diarrhea that lasts more than 2 days or if it is severe and watery. This medicine can interfere with some urine glucose tests. If you use such tests, talk with your health care professional. If you are being treated for a sexually transmitted disease, avoid sexual contact until you have finished your treatment. Your sexual partner may also need treatment. What side effects may I notice from receiving this medicine? Side effects that you should report  to your doctor or health care professional as soon as possible: -allergic reactions like skin rash, itching or hives, swelling of the face, lips, or tongue -dark urine -difficulty breathing -fever -irregular heartbeat or chest pain -redness, blistering, peeling or loosening of the skin, including inside the mouth -seizures -unusual bleeding or bruising -unusually weak or tired -white patches or sores in the mouth Side effects that usually do not require medical attention (report to your  doctor or health care professional if they continue or are bothersome): -diarrhea -gas or heartburn -headache -nausea, vomiting -vaginal itching This list may not describe all possible side effects. Call your doctor for medical advice about side effects. You may report side effects to FDA at 1-800-FDA-1088. Where should I keep my medicine? Keep out of the reach of children. Store at room temperature between 15 and 30 degrees C (59 and 86 degrees F). Keep container tightly closed. Protect from moisture. Throw away any unused medicine after the expiration date. NOTE: This sheet is a summary. It may not cover all possible information. If you have questions about this medicine, talk to your doctor, pharmacist, or health care provider.  2018 Elsevier/Gold Standard (2013-06-07 09:43:18) [employer] [facility] [place] [facility]  DIET:  Cardiac diet  DISCHARGE CONDITION:  Stable  ACTIVITY:  Activity as tolerated  OXYGEN:  Home Oxygen: No.   Oxygen Delivery: room air  DISCHARGE LOCATION:  home    ADDITIONAL DISCHARGE INSTRUCTION:   If you experience worsening of your admission symptoms, develop shortness of breath, life threatening emergency, suicidal or homicidal thoughts you must seek medical attention immediately by calling 911 or calling your MD immediately  if symptoms less severe.  You Must read complete instructions/literature along with all the possible adverse reactions/side effects for all the Medicines you take and that have been prescribed to you. Take any new Medicines after you have completely understood and accpet all the possible adverse reactions/side effects.   Please note  You were cared for by a hospitalist during your hospital stay. If you have any questions about your discharge medications or the care you received while you were in the hospital after you are discharged, you can call the unit and asked to speak with the hospitalist on call if the  hospitalist that took care of you is not available. Once you are discharged, your primary care physician will handle any further medical issues. Please note that NO REFILLS for any discharge medications will be authorized once you are discharged, as it is imperative that you return to your primary care physician (or establish a relationship with a primary care physician if you do not have one) for your aftercare needs so that they can reassess your need for medications and monitor your lab values.

## 2017-02-06 ENCOUNTER — Emergency Department
Admission: EM | Admit: 2017-02-06 | Discharge: 2017-02-06 | Disposition: A | Discharge status: Home / Self Care | Payer: Medicare PPO | Attending: Emergency Medicine | Admitting: Emergency Medicine

## 2017-02-06 ENCOUNTER — Emergency Department: Payer: Medicare PPO

## 2017-02-06 DIAGNOSIS — Z7982 Long term (current) use of aspirin: Secondary | ICD-10-CM | POA: Diagnosis not present

## 2017-02-06 DIAGNOSIS — Z79899 Other long term (current) drug therapy: Secondary | ICD-10-CM | POA: Insufficient documentation

## 2017-02-06 DIAGNOSIS — E119 Type 2 diabetes mellitus without complications: Secondary | ICD-10-CM | POA: Diagnosis not present

## 2017-02-06 DIAGNOSIS — R252 Cramp and spasm: Secondary | ICD-10-CM

## 2017-02-06 DIAGNOSIS — R112 Nausea with vomiting, unspecified: Secondary | ICD-10-CM | POA: Insufficient documentation

## 2017-02-06 DIAGNOSIS — R109 Unspecified abdominal pain: Secondary | ICD-10-CM | POA: Diagnosis present

## 2017-02-06 DIAGNOSIS — Z794 Long term (current) use of insulin: Secondary | ICD-10-CM | POA: Insufficient documentation

## 2017-02-06 DIAGNOSIS — E876 Hypokalemia: Secondary | ICD-10-CM | POA: Diagnosis not present

## 2017-02-06 DIAGNOSIS — I10 Essential (primary) hypertension: Secondary | ICD-10-CM | POA: Diagnosis not present

## 2017-02-06 LAB — COMPREHENSIVE METABOLIC PANEL
ALBUMIN: 4.1 g/dL (ref 3.5–5.0)
ALT: 21 U/L (ref 14–54)
AST: 40 U/L (ref 15–41)
Alkaline Phosphatase: 67 U/L (ref 38–126)
Anion gap: 13 (ref 5–15)
BUN: 33 mg/dL — AB (ref 6–20)
CHLORIDE: 99 mmol/L — AB (ref 101–111)
CO2: 21 mmol/L — ABNORMAL LOW (ref 22–32)
Calcium: 9.4 mg/dL (ref 8.9–10.3)
Creatinine, Ser: 1.38 mg/dL — ABNORMAL HIGH (ref 0.44–1.00)
GFR calc Af Amer: 41 mL/min — ABNORMAL LOW (ref >60)
GFR calc non Af Amer: 36 mL/min — ABNORMAL LOW (ref >60)
GLUCOSE: 191 mg/dL — AB (ref 65–99)
POTASSIUM: 3.3 mmol/L — AB (ref 3.5–5.1)
SODIUM: 133 mmol/L — AB (ref 135–145)
Total Bilirubin: 0.8 mg/dL (ref 0.3–1.2)
Total Protein: 7.3 g/dL (ref 6.5–8.1)

## 2017-02-06 LAB — URINALYSIS, COMPLETE (UACMP) WITH MICROSCOPIC
Bilirubin Urine: NEGATIVE
GLUCOSE, UA: NEGATIVE mg/dL
Hgb urine dipstick: NEGATIVE
KETONES UR: NEGATIVE mg/dL
LEUKOCYTES UA: NEGATIVE
Nitrite: NEGATIVE
PROTEIN: NEGATIVE mg/dL
SQUAMOUS EPITHELIAL / LPF: NONE SEEN
Specific Gravity, Urine: 1.017 (ref 1.005–1.030)
pH: 5 (ref 5.0–8.0)

## 2017-02-06 LAB — LIPASE, BLOOD: LIPASE: 25 U/L (ref 11–51)

## 2017-02-06 LAB — CBC
HCT: 34.9 % — ABNORMAL LOW (ref 35.0–47.0)
Hemoglobin: 12 g/dL (ref 12.0–16.0)
MCH: 30.6 pg (ref 26.0–34.0)
MCHC: 34.3 g/dL (ref 32.0–36.0)
MCV: 89.2 fL (ref 80.0–100.0)
PLATELETS: 254 10*3/uL (ref 150–440)
RBC: 3.92 MIL/uL (ref 3.80–5.20)
RDW: 13 % (ref 11.5–14.5)
WBC: 9.5 10*3/uL (ref 3.6–11.0)

## 2017-02-06 LAB — TROPONIN I: Troponin I: <0.03 ng/mL (ref <0.03)

## 2017-02-06 MED ORDER — IOPAMIDOL (ISOVUE-300) INJECTION 61%
75.0000 mL | Freq: Once | INTRAVENOUS | Status: AC | PRN
  Administered 2017-02-06: 75 mL via INTRAVENOUS

## 2017-02-06 MED ORDER — POTASSIUM CHLORIDE 20 MEQ PO PACK
40.0000 meq | PACK | Freq: Once | ORAL | Status: AC
  Administered 2017-02-06: 40 meq via ORAL
  Filled 2017-02-06: qty 2

## 2017-02-06 MED ORDER — POTASSIUM CHLORIDE 20 MEQ PO PACK
PACK | ORAL | Status: AC
  Filled 2017-02-06: qty 2

## 2017-02-06 MED ORDER — ONDANSETRON HCL 4 MG/2ML IJ SOLN
4.0000 mg | Freq: Once | INTRAMUSCULAR | Status: AC
Start: 2017-02-06 — End: 2017-02-06
  Administered 2017-02-06: 4 mg via INTRAVENOUS
  Filled 2017-02-06: qty 2

## 2017-02-06 MED ORDER — SODIUM CHLORIDE 0.9 % IV BOLUS (SEPSIS)
1000.0000 mL | Freq: Once | INTRAVENOUS | Status: AC
  Administered 2017-02-06: 1000 mL via INTRAVENOUS

## 2017-02-06 NOTE — ED Provider Notes (Signed)
[facility] Emergency Department Provider Note   First MD Initiated Contact with Patient 02/06/17 0112     (approximate)  I have reviewed the triage vital signs and the nursing notes.   HISTORY  Chief Complaint Abdominal Cramping and Leg Pain   HPI Vicki HippsRoberta [name] is a 76 y.o. female with bolus of chronic medical conditions including hypokalemia presents to the emergency department with acute onset of generalized muscle aches tonight. Patient states that the pain began initially in her lower extremities but then subsequently became generalized. Patient does admit to one episode of vomiting after eating today. Patient denies any abdominal pain no fever no diarrhea or constipation.   Past Medical History:  Diagnosis Date  . Diabetes mellitus without complication (HCC)   . GERD (gastroesophageal reflux disease)   . Hypertension   . Lesion of sciatic nerve, right side     Patient Active Problem List   Diagnosis Date Noted  . NSTEMI (non-ST elevated myocardial infarction) (HCC) 10/01/2016  . 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 Diabetes (HCC) 09/29/2016  . GERD (gastroesophageal reflux disease) 09/29/2016  . Nausea and vomiting 03/24/2016  . Diffuse abdominal pain 03/24/2016  . Hypokalemia 03/24/2016  . Chest pain 03/24/2016  . Elevated troponin I level 09/26/2015  . DKA (diabetic ketoacidoses) (HCC) 09/25/2015    Past Surgical History:  Procedure Laterality Date  . ABDOMINAL HYSTERECTOMY    . BREAST BIOPSY Right   . BREAST SURGERY    . LEFT HEART CATH AND CORONARY ANGIOGRAPHY N/A 10/01/2016   Procedure: Left Heart Cath and Coronary Angiography and possible PCI;  Surgeon: Alwyn Peawayne D Callwood, MD;  Location: [facility] INVASIVE CV LAB;  Service: Cardiovascular;  Laterality: N/A;    Prior to Admission medications   Medication Sig Start Date End Date Taking? Authorizing Provider  acetaminophen (TYLENOL) 500 MG tablet Take 1,000-1,500 mg by mouth  every 6 (six) hours as needed for mild pain or headache.    [provider]  aspirin EC 81 MG EC tablet Take 1 tablet (81 mg total) by mouth daily. 03/26/16   [name], [name], MD  brimonidine-timolol (COMBIGAN) 0.2-0.5 % ophthalmic solution Place 1 drop into both eyes daily.    [provider]  citalopram (CELEXA) 20 MG tablet Take 20 mg by mouth daily.    [provider]  docusate sodium (COLACE) 100 MG capsule Take 1 capsule (100 mg total) by mouth 2 (two) times daily. 10/04/16   [name], [name], MD  fluticasone (FLONASE) 50 MCG/ACT nasal spray Place 2 sprays into both nostrils daily.    [provider]  hydrALAZINE (APRESOLINE) 25 MG tablet Take 1 tablet (25 mg total) by mouth every 8 (eight) hours. 10/03/16   [name], [name], MD  hydrochlorothiazide (HYDRODIURIL) 12.5 MG tablet Take 1 tablet (12.5 mg total) by mouth daily. 10/03/16   [name], [name], MD  Insulin Glargine (TOUJEO SOLOSTAR) 300 UNIT/ML SOPN Inject 40 Units into the skin at bedtime. 03/26/16   [name], [name], MD  insulin lispro (HUMALOG) 100 UNIT/ML injection Inject 0.07 mLs (7 Units total) into the skin 3 (three) times daily before meals. Pt uses per sliding scale. 03/26/16   [name], [name], MD  isosorbide mononitrate (IMDUR) 30 MG 24 hr tablet Take 1 tablet (30 mg total) by mouth daily. 03/26/16   [name], [name], MD  labetalol (NORMODYNE) 100 MG tablet Take 100 mg by mouth 2 (two) times daily.    [provider]  loratadine (CLARITIN) 10 MG tablet Take 10  mg by mouth daily.    [provider]  lovastatin (MEVACOR) 20 MG tablet Take 40 mg by mouth at bedtime.     [provider]  magnesium oxide (MAG-OX) 400 MG tablet Take 400 mg by mouth daily.    [provider]  Multiple Vitamins-Minerals (CENTRUM ADULTS) TABS Take by mouth.    [provider]  omeprazole (PRILOSEC) 20 MG capsule Take 1 capsule (20 mg total) by mouth daily. 09/28/15   Katha Hamming, MD    ondansetron (ZOFRAN) 4 MG tablet Take 1 tablet (4 mg total) by mouth every 8 (eight) hours as needed for nausea or vomiting. 10/03/16   Auburn Bilberry, MD  polyethylene glycol [facility] Ethelene Hal) packet Take 17 g by mouth daily. Patient not taking: Reported on 09/25/2015 05/03/15   [name], MD  polyethylene glycol [facility] / Ethelene Hal) packet Take 17 g by mouth daily. 10/04/16   Auburn Bilberry, MD  quinapril (ACCUPRIL) 20 MG tablet Take 2 tablets (40 mg total) by mouth daily. 10/01/16   Auburn Bilberry, MD    Allergies No known drug allergies  Family History  Problem Relation Age of Onset  . Heart disease Mother   . Diabetes Mellitus II Father   . Hypertension Sister   . Diabetes Mellitus II Sister   . Breast cancer Sister 40  . Hypertension Brother   . Diabetes Mellitus II Brother     Social History Social History  Substance Use Topics  . Smoking status: Never Smoker  . Smokeless tobacco: Never Used  . Alcohol use No    Review of Systems Constitutional: No fever/chills Eyes: No visual changes. ENT: No sore throat. Cardiovascular: Denies chest pain. Respiratory: Denies shortness of breath. Gastrointestinal: No abdominal pain.  No nausea, Positive for vomiting. No diarrhea or constipation.  Genitourinary: Negative for dysuria. Musculoskeletal: Positive for generalized muscle aches Integumentary: Negative for rash. Neurological: Negative for headaches, focal weakness or numbness.  ____________________________________________   PHYSICAL EXAM:  VITAL SIGNS: ED Triage Vitals  Enc Vitals Group     BP      Pulse      Resp      Temp      Temp src      SpO2      Weight      Height      Head Circumference      Peak Flow      Pain Score      Pain Loc      Pain Edu?      Excl. in GC?     Constitutional: Alert and oriented. Well appearing and in no acute distress. Eyes: Conjunctivae are normal. PERRL. EOMI. Head: Atraumatic. Mouth/Throat: Mucous  membranes are moist.  Oropharynx non-erythematous. Neck: No stridor.  Cardiovascular: Normal rate, regular rhythm. Good peripheral circulation. Grossly normal heart sounds. Respiratory: Normal respiratory effort.  No retractions. Lungs CTAB. Gastrointestinal: Soft and nontender. No distention.  Musculoskeletal: No lower extremity tenderness nor edema. No gross deformities of extremities. Neurologic:  Normal speech and language. No gross focal neurologic deficits are appreciated.  Skin:  Skin is warm, dry and intact. No rash noted. Psychiatric: Mood and affect are normal. Speech and behavior are normal.  ____________________________________________   LABS (all labs ordered are listed, but only abnormal results are displayed)  Labs Reviewed  CBC - Abnormal; Notable for the following:       Result Value   HCT 34.9 (*)    All other components within  normal limits  COMPREHENSIVE METABOLIC PANEL - Abnormal; Notable for the following:    Sodium 133 (*)    Potassium 3.3 (*)    Chloride 99 (*)    CO2 21 (*)    Glucose, Bld 191 (*)    BUN 33 (*)    Creatinine, Ser 1.38 (*)    GFR calc non Af Amer 36 (*)    GFR calc Af Amer 41 (*)    All other components within normal limits  LIPASE, BLOOD  TROPONIN I  URINALYSIS, COMPLETE (UACMP) WITH MICROSCOPIC   ____________________________________________  EKG  ED ECG REPORT I, [place] Lyonel Morejon, the attending physician, personally viewed and interpreted this ECG.   Date: 02/06/2017  EKG Time: 1:17 AM  Rate: 70  Rhythm: Normal sinus rhythm  Axis: Normal  Intervals: Normal  ST&T Change: Lateral T-wave inversions with diffuse T-wave flattening  ____________________________________________  RADIOLOGY I, [place] Dewayne Shorter, personally viewed and evaluated these images (plain radiographs) as part of my medical decision making, as well as reviewing the written report by the radiologist.  Ct Abdomen Pelvis W Contrast  Result Date:  02/06/2017 CLINICAL DATA:  Diarrhea and vomiting.  Abdominal pain. EXAM: CT ABDOMEN AND PELVIS WITH CONTRAST TECHNIQUE: Multidetector CT imaging of the abdomen and pelvis was performed using the standard protocol following bolus administration of intravenous contrast. CONTRAST:  75mL ISOVUE-300 IOPAMIDOL (ISOVUE-300) INJECTION 61% COMPARISON:  CT abdomen pelvis 05/03/2015 FINDINGS: Lower chest: No pulmonary nodules. No visible pleural or pericardial effusion. Hepatobiliary: Normal hepatic size and contours. No focal lesion. There is intrahepatic biliary dilatation, likely owing to postcholecystectomy status. The gallbladder is absent. Pancreas: Normal pancreatic contours and enhancement. No peripancreatic fluid collection or pancreatic ductal dilatation. Spleen: Normal. Adrenals/Urinary Tract: Normal adrenal glands. No hydronephrosis or solid renal mass. Stomach/Bowel: There is no hiatal hernia. The stomach and duodenum are normal. There is no dilated small bowel or enteric inflammation. There is no colonic abnormality. The appendix is normal. Vascular/Lymphatic: Normal course and caliber of the major abdominal vessels. No abdominal or pelvic adenopathy. Reproductive: Status post hysterectomy.  No adnexal mass. Musculoskeletal: Multilevel lumbar facet arthrosis. No bony spinal canal stenosis. Normal visualized extrathoracic and extraperitoneal soft tissues. Other: No contributory non-categorized findings. IMPRESSION: No acute abnormality of the abdomen or pelvis. Electronically Signed   By: Deatra Robinson M.D.   On: 02/06/2017 03:04     Procedures   ____________________________________________   INITIAL IMPRESSION / ASSESSMENT AND PLAN / ED COURSE  Pertinent labs & imaging results that were available during my care of the patient were reviewed by me and considered in my medical decision making (see chart for details).  76 year old female presenting to the emergency department with generalized muscle  cramps and vomiting. Laboratory data consistent with hypokalemia. CT scan of the abdomen revealed no acute intra-abdominal pathology. Patient given 40 mEq of oral potassium in the emergency department. No further episodes of emesis or muscle cramps while in ED following potassium. Patient states that she feels "much better" at this time.    ____________________________________________  FINAL CLINICAL IMPRESSION(S) / ED DIAGNOSES  Final diagnoses:  Muscle cramps  Hypokalemia  Non-intractable vomiting with nausea, unspecified vomiting type     MEDICATIONS GIVEN DURING THIS VISIT:  Medications  sodium chloride 0.9 % bolus 1,000 mL (0 mLs Intravenous Stopped 02/06/17 0451)  potassium chloride (KLOR-CON) packet 40 mEq (40 mEq Oral Given 02/06/17 0227)  ondansetron (ZOFRAN) injection 4 mg (4 mg Intravenous Given 02/06/17 0227)  iopamidol (ISOVUE-300) 61 %  injection 75 mL (75 mLs Intravenous Contrast Given 02/06/17 0235)     NEW OUTPATIENT MEDICATIONS STARTED DURING THIS VISIT:  New Prescriptions   No medications on file    Modified Medications   No medications on file    Discontinued Medications   No medications on file     Note:  This document was prepared using Dragon voice recognition software and may include unintentional dictation errors.    Darci Current, MD 02/06/17 [id number]

## 2017-02-06 NOTE — ED Triage Notes (Signed)
Pt from home, having cramps in stomach and legs.  Pt states that she has been having diarrhea and vomiting recently.  Pt A&Ox4 at this time.  Pt has hx of MI, stroke and diabetes.  EMS reports pt having flattened T waves.  Pt in NAD at this time.

## 2017-05-13 ENCOUNTER — Emergency Department
Admission: EM | Admit: 2017-05-13 | Discharge: 2017-05-13 | Disposition: A | Discharge status: Home / Self Care | Payer: Medicare PPO | Attending: Emergency Medicine | Admitting: Emergency Medicine

## 2017-05-13 DIAGNOSIS — R112 Nausea with vomiting, unspecified: Secondary | ICD-10-CM | POA: Diagnosis present

## 2017-05-13 DIAGNOSIS — M6281 Muscle weakness (generalized): Secondary | ICD-10-CM | POA: Diagnosis not present

## 2017-05-13 DIAGNOSIS — Z7982 Long term (current) use of aspirin: Secondary | ICD-10-CM | POA: Diagnosis not present

## 2017-05-13 DIAGNOSIS — W19XXXA Unspecified fall, initial encounter: Secondary | ICD-10-CM

## 2017-05-13 DIAGNOSIS — I252 Old myocardial infarction: Secondary | ICD-10-CM | POA: Diagnosis not present

## 2017-05-13 DIAGNOSIS — R531 Weakness: Secondary | ICD-10-CM

## 2017-05-13 DIAGNOSIS — Z794 Long term (current) use of insulin: Secondary | ICD-10-CM | POA: Diagnosis not present

## 2017-05-13 DIAGNOSIS — I10 Essential (primary) hypertension: Secondary | ICD-10-CM | POA: Insufficient documentation

## 2017-05-13 DIAGNOSIS — Z79899 Other long term (current) drug therapy: Secondary | ICD-10-CM | POA: Insufficient documentation

## 2017-05-13 DIAGNOSIS — R197 Diarrhea, unspecified: Secondary | ICD-10-CM | POA: Insufficient documentation

## 2017-05-13 DIAGNOSIS — E119 Type 2 diabetes mellitus without complications: Secondary | ICD-10-CM | POA: Diagnosis not present

## 2017-05-13 DIAGNOSIS — N390 Urinary tract infection, site not specified: Secondary | ICD-10-CM

## 2017-05-13 LAB — URINALYSIS, COMPLETE (UACMP) WITH MICROSCOPIC
BACTERIA UA: NONE SEEN
BILIRUBIN URINE: NEGATIVE
GLUCOSE, UA: 50 mg/dL — AB
HGB URINE DIPSTICK: NEGATIVE
Ketones, ur: NEGATIVE mg/dL
NITRITE: NEGATIVE
PROTEIN: 100 mg/dL — AB
Specific Gravity, Urine: 1.023 (ref 1.005–1.030)
pH: 5 (ref 5.0–8.0)

## 2017-05-13 LAB — COMPREHENSIVE METABOLIC PANEL
ALBUMIN: 3.5 g/dL (ref 3.5–5.0)
ALK PHOS: 73 U/L (ref 38–126)
ALT: 21 U/L (ref 14–54)
AST: 27 U/L (ref 15–41)
Anion gap: 6 (ref 5–15)
BUN: 17 mg/dL (ref 6–20)
CALCIUM: 8.9 mg/dL (ref 8.9–10.3)
CO2: 28 mmol/L (ref 22–32)
CREATININE: 0.99 mg/dL (ref 0.44–1.00)
Chloride: 106 mmol/L (ref 101–111)
GFR calc Af Amer: >60 mL/min (ref >60)
GFR calc non Af Amer: 53 mL/min — ABNORMAL LOW (ref >60)
GLUCOSE: 77 mg/dL (ref 65–99)
Potassium: 3.5 mmol/L (ref 3.5–5.1)
SODIUM: 140 mmol/L (ref 135–145)
Total Bilirubin: 0.7 mg/dL (ref 0.3–1.2)
Total Protein: 6.8 g/dL (ref 6.5–8.1)

## 2017-05-13 LAB — CBC WITH DIFFERENTIAL/PLATELET
BASOS ABS: 0 10*3/uL (ref 0–0.1)
BASOS PCT: 1 %
Eosinophils Absolute: 0.3 10*3/uL (ref 0–0.7)
Eosinophils Relative: 3 %
HEMATOCRIT: 38.2 % (ref 35.0–47.0)
HEMOGLOBIN: 13.2 g/dL (ref 12.0–16.0)
Lymphocytes Relative: 38 %
Lymphs Abs: 3.4 10*3/uL (ref 1.0–3.6)
MCH: 30.3 pg (ref 26.0–34.0)
MCHC: 34.5 g/dL (ref 32.0–36.0)
MCV: 87.8 fL (ref 80.0–100.0)
Monocytes Absolute: 0.7 10*3/uL (ref 0.2–0.9)
Monocytes Relative: 7 %
NEUTROS ABS: 4.6 10*3/uL (ref 1.4–6.5)
NEUTROS PCT: 51 %
Platelets: 301 10*3/uL (ref 150–440)
RBC: 4.35 MIL/uL (ref 3.80–5.20)
RDW: 13.3 % (ref 11.5–14.5)
WBC: 8.9 10*3/uL (ref 3.6–11.0)

## 2017-05-13 LAB — LIPASE, BLOOD: Lipase: 23 U/L (ref 11–51)

## 2017-05-13 LAB — TROPONIN I: Troponin I: <0.03 ng/mL (ref <0.03)

## 2017-05-13 MED ORDER — ONDANSETRON 4 MG PO TBDP: 4.0000 mg | ORAL_TABLET | Freq: Three times a day (TID) | ORAL | 0 refills | Status: DC | PRN

## 2017-05-13 MED ORDER — CEPHALEXIN 500 MG PO CAPS: 500.0000 mg | ORAL_CAPSULE | Freq: Three times a day (TID) | ORAL | 0 refills | Status: DC

## 2017-05-13 MED ORDER — SODIUM CHLORIDE 0.9 % IV BOLUS (SEPSIS)
1000.0000 mL | Freq: Once | INTRAVENOUS | Status: AC
  Administered 2017-05-13: 1000 mL via INTRAVENOUS

## 2017-05-13 MED ORDER — CEFTRIAXONE SODIUM IN DEXTROSE 20 MG/ML IV SOLN
1.0000 g | Freq: Once | INTRAVENOUS | Status: AC
  Administered 2017-05-13: 1 g via INTRAVENOUS
  Filled 2017-05-13: qty 50

## 2017-05-13 NOTE — Discharge Instructions (Signed)
1. Take antibiotic as prescribed (Keflex 500 mg 3 times daily 7 days). 2. You may take Zofran as needed for nausea. 3. Clear liquids 12 hours, then BRAT diet 3 days, then slowly advance diet as tolerated. Return to the ER for worsening symptoms, persistent vomiting, difficulty breathing, fever, or other concerns.

## 2017-05-13 NOTE — ED Triage Notes (Signed)
PT arrived via EMS from home with complaints a fall and associated nausea, vomiting, and diarrhea. Pt states she is having cramps in her stomach. VS per EMS WNL BP-142/65 HR-64 BS-199 O2sat-100%RA. EMS gave  Zofran. Pt has a 20g Left AC

## 2017-05-13 NOTE — ED Provider Notes (Signed)
[facility] Emergency Department Provider Note   ____________________________________________   First MD Initiated Contact with Patient 05/13/17 0134     (approximate)  I have reviewed the triage vital signs and the nursing notes.   HISTORY  Chief Complaint Fall; Nausea; Emesis; and Diarrhea    HPI Vicki [name] is a 76 y.o. female brought to the ED from home via EMS with a chief complaint of N/V/D.Patient was in her usual state of health until this evening when she had one episode of simultaneous nausea/vomiting/diarrhea. Symptoms associated with abdominal cramps. She made her way into the kitchen, felt generally weak, held onto the stove and eased herself onto the ground. Did not strike head or suffer LOC. Denies recent fever, chills, chest pain, shortness of breath, dysuria. Denies recent travel or trauma. Zofran administered by EMS en route alleviated her nausea.   Past Medical History:  Diagnosis Date  . Diabetes mellitus without complication (HCC)   . GERD (gastroesophageal reflux disease)   . Hypertension   . Lesion of sciatic nerve, right side     Patient Active Problem List   Diagnosis Date Noted  . NSTEMI (non-ST elevated myocardial infarction) (HCC) 10/01/2016  . 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 Diabetes (HCC) 09/29/2016  . GERD (gastroesophageal reflux disease) 09/29/2016  . Nausea and vomiting 03/24/2016  . Diffuse abdominal pain 03/24/2016  . Hypokalemia 03/24/2016  . Chest pain 03/24/2016  . Elevated troponin I level 09/26/2015  . DKA (diabetic ketoacidoses) (HCC) 09/25/2015    Past Surgical History:  Procedure Laterality Date  . ABDOMINAL HYSTERECTOMY    . BREAST BIOPSY Right   . BREAST SURGERY    . LEFT HEART CATH AND CORONARY ANGIOGRAPHY N/A 10/01/2016   Procedure: Left Heart Cath and Coronary Angiography and possible PCI;  Surgeon: Alwyn Pea, MD;  Location: [facility] INVASIVE CV LAB;  Service:  Cardiovascular;  Laterality: N/A;    Prior to Admission medications   Medication Sig Start Date End Date Taking? Authorizing Provider  acetaminophen (TYLENOL) 500 MG tablet Take 1,000-1,500 mg by mouth every 6 (six) hours as needed for mild pain or headache.    [provider]  aspirin EC 81 MG EC tablet Take 1 tablet (81 mg total) by mouth daily. 03/26/16   Katharina Caper, MD  brimonidine-timolol (COMBIGAN) 0.2-0.5 % ophthalmic solution Place 1 drop into both eyes daily.    [provider]  citalopram (CELEXA) 20 MG tablet Take 20 mg by mouth daily.    [provider]  docusate sodium (COLACE) 100 MG capsule Take 1 capsule (100 mg total) by mouth 2 (two) times daily. 10/04/16   Auburn Bilberry, MD  donepezil (ARICEPT) 5 MG tablet Take 5 mg by mouth at bedtime.    [provider]  fluticasone (FLONASE) 50 MCG/ACT nasal spray Place 2 sprays into both nostrils daily.    [provider]  hydrALAZINE (APRESOLINE) 25 MG tablet Take 1 tablet (25 mg total) by mouth every 8 (eight) hours. 10/03/16   Auburn Bilberry, MD  hydrochlorothiazide (HYDRODIURIL) 12.5 MG tablet Take 1 tablet (12.5 mg total) by mouth daily. 10/03/16   Auburn Bilberry, MD  Insulin Glargine (TOUJEO SOLOSTAR) 300 UNIT/ML SOPN Inject 40 Units into the skin at bedtime. 03/26/16   Katharina Caper, MD  insulin lispro (HUMALOG) 100 UNIT/ML injection Inject 0.07 mLs (7 Units total) into the skin 3 (three) times daily before meals. Pt uses per sliding scale. 03/26/16   Katharina Caper, MD  isosorbide mononitrate (IMDUR) 30 MG 24 hr tablet Take 1 tablet (30 mg total) by mouth daily. 03/26/16   Katharina Caper, MD  labetalol (NORMODYNE) 100 MG tablet Take 100 mg by mouth 2 (two) times daily.    [provider]  loratadine (CLARITIN) 10 MG tablet Take 10 mg by mouth daily.    [provider]  lovastatin (MEVACOR) 20 MG tablet Take 40 mg by mouth at bedtime.     [provider]    magnesium oxide (MAG-OX) 400 MG tablet Take 400 mg by mouth daily.    [provider]  Multiple Vitamins-Minerals (CENTRUM ADULTS) TABS Take by mouth.    [provider]  omeprazole (PRILOSEC) 20 MG capsule Take 1 capsule (20 mg total) by mouth daily. 09/28/15   Katha Hamming, MD  ondansetron (ZOFRAN) 4 MG tablet Take 1 tablet (4 mg total) by mouth every 8 (eight) hours as needed for nausea or vomiting. 10/03/16   Auburn Bilberry, MD  polyethylene glycol [facility] / Ethelene Hal) packet Take 17 g by mouth daily. Patient not taking: Reported on 09/25/2015 05/03/15   [name], MD  polyethylene glycol [facility] / Ethelene Hal) packet Take 17 g by mouth daily. 10/04/16   Auburn Bilberry, MD  quinapril (ACCUPRIL) 20 MG tablet Take 2 tablets (40 mg total) by mouth daily. 10/01/16   Auburn Bilberry, MD    Allergies Patient has no known allergies.  Family History  Problem Relation Age of Onset  . Heart disease Mother   . Diabetes Mellitus II Father   . Hypertension Sister   . Diabetes Mellitus II Sister   . Breast cancer Sister 69  . Hypertension Brother   . Diabetes Mellitus II Brother     Social History Social History  Substance Use Topics  . Smoking status: Never Smoker  . Smokeless tobacco: Never Used  . Alcohol use No    Review of Systems  Constitutional: No fever/chills. Eyes: No visual changes. ENT: No sore throat. Cardiovascular: Denies chest pain. Respiratory: Denies shortness of breath. Gastrointestinal: Positive for abdominal cramps, nausea, vomiting and diarrhea.  No constipation. Genitourinary: Negative for dysuria. Musculoskeletal: Negative for back pain. Skin: Negative for rash. Neurological: Negative for headaches, focal weakness or numbness.   ____________________________________________   PHYSICAL EXAM:  VITAL SIGNS: ED Triage Vitals  Enc Vitals Group     BP 05/13/17 0127 (!) 164/84     Pulse Rate 05/13/17 0127 60     Resp  05/13/17 0127 18     Temp 05/13/17 0127 97.8 F (36.6 C)     Temp Source 05/13/17 0127 Oral     SpO2 05/13/17 0127 100 %     Weight 05/13/17 0127 154 lb (69.9 kg)     Height 05/13/17 0127  (1.6 m)     Head Circumference --      Peak Flow --      Pain Score 05/13/17 0126 10     Pain Loc --      Pain Edu? --      Excl. in GC? --     Constitutional: Alert and oriented. Well appearing and in no acute distress. Eyes: Conjunctivae are normal. PERRL. EOMI. Head: Atraumatic. Nose: No congestion/rhinnorhea. Mouth/Throat: Mucous membranes are mildly dry.  Oropharynx non-erythematous. Neck: No stridor.  No carotid bruits. Cardiovascular: Normal rate, regular rhythm. Grossly normal heart sounds.  Good peripheral circulation. Respiratory: Normal respiratory effort.  No retractions. Lungs CTAB. Gastrointestinal: Soft and nontender to light or deep  palpation. No distention. No abdominal bruits. No CVA tenderness. Musculoskeletal: No lower extremity tenderness nor edema.  No joint effusions. Neurologic:  Normal speech and language. No gross focal neurologic deficits are appreciated.  Skin:  Skin is warm, dry and intact. No rash noted. Psychiatric: Mood and affect are normal. Speech and behavior are normal.  ____________________________________________   LABS (all labs ordered are listed, but only abnormal results are displayed)  Labs Reviewed  URINALYSIS, COMPLETE (UACMP) WITH MICROSCOPIC - Abnormal; Notable for the following:       Result Value   Color, Urine AMBER (*)    APPearance HAZY (*)    Glucose, UA 50 (*)    Protein, ur 100 (*)    Leukocytes, UA TRACE (*)    Squamous Epithelial / LPF 0-5 (*)    All other components within normal limits  COMPREHENSIVE METABOLIC PANEL - Abnormal; Notable for the following:    GFR calc non Af Amer 53 (*)    All other components within normal limits  URINE CULTURE  CBC WITH DIFFERENTIAL/PLATELET  LIPASE, BLOOD  TROPONIN I    ____________________________________________  EKG  ED ECG REPORT I, SUNG,JADE J, the attending physician, personally viewed and interpreted this ECG.   Date: 05/13/2017  EKG Time: 0126  Rate: 59  Rhythm: normal EKG, normal sinus rhythm  Axis: Normal  Intervals:none  ST&T Change: Nonspecific T-wave abnormalities Unchanged from 02/06/2017  ____________________________________________  RADIOLOGY  No results found.  ____________________________________________   PROCEDURES  Procedure(s) performed: None  Procedures  Critical Care performed: No  ____________________________________________   INITIAL IMPRESSION / ASSESSMENT AND PLAN / ED COURSE  As part of my medical decision making, I reviewed the following data within the electronic MEDICAL RECORD NUMBER Nursing notes reviewed and incorporated, Labs reviewed, EKG interpreted\, Old EKG reviewed, Old chart reviewed and Notes from prior ED visits.   76 year old female who presents with N/V/D and generalized weakness. Differential diagnosis includes, but is not limited to, biliary disease (biliary colic, acute cholecystitis, cholangitis, choledocholithiasis, etc), intrathoracic causes for epigastric abdominal pain including ACS, gastritis, duodenitis, pancreatitis, small bowel or large bowel obstruction, abdominal aortic aneurysm, hernia, and gastritis. Urinalysis reveals mild UTI. Will initiate IV fluid resuscitation, antibiotic. Awaiting laboratory results.  Clinical Course as of May 13 636  Tue May 13, 2017  1610 Updated patient of laboratory results. She is feeling much better and willing to attempt PO challenge.  [JS]  [id number] Patient tolerated ice chips without emesis. Overall feels much better. Will discharge home with Zofran to use as needed and she will follow up closely with her PCP this week. Strict return precautions given. Patient verbalizes understanding and agrees with plan of care.  [JS]    Clinical Course User  Index [JS] Irean Hong, MD     ____________________________________________   FINAL CLINICAL IMPRESSION(S) / ED DIAGNOSES  Final diagnoses:  Nausea vomiting and diarrhea  Lower urinary tract infectious disease  Weakness generalized  Fall, initial encounter      NEW MEDICATIONS STARTED DURING THIS VISIT:  New Prescriptions   No medications on file     Note:  This document was prepared using Dragon voice recognition software and may include unintentional dictation errors.    Irean Hong, MD 05/13/17 [phone]

## 2017-05-14 LAB — URINE CULTURE

## 2017-06-03 ENCOUNTER — Other Ambulatory Visit: Payer: Self-pay | Admitting: Internal Medicine

## 2017-06-03 DIAGNOSIS — Z1231 Encounter for screening mammogram for malignant neoplasm of breast: Secondary | ICD-10-CM

## 2017-07-20 ENCOUNTER — Emergency Department: Payer: Medicare PPO

## 2017-07-20 ENCOUNTER — Emergency Department
Admission: EM | Admit: 2017-07-20 | Discharge: 2017-07-20 | Disposition: A | Discharge status: Home / Self Care | Payer: Medicare PPO | Attending: Emergency Medicine | Admitting: Emergency Medicine

## 2017-07-20 DIAGNOSIS — E1165 Type 2 diabetes mellitus with hyperglycemia: Secondary | ICD-10-CM | POA: Diagnosis not present

## 2017-07-20 DIAGNOSIS — Z794 Long term (current) use of insulin: Secondary | ICD-10-CM | POA: Insufficient documentation

## 2017-07-20 DIAGNOSIS — R739 Hyperglycemia, unspecified: Secondary | ICD-10-CM

## 2017-07-20 DIAGNOSIS — Z7982 Long term (current) use of aspirin: Secondary | ICD-10-CM | POA: Insufficient documentation

## 2017-07-20 DIAGNOSIS — Z79899 Other long term (current) drug therapy: Secondary | ICD-10-CM | POA: Diagnosis not present

## 2017-07-20 DIAGNOSIS — I10 Essential (primary) hypertension: Secondary | ICD-10-CM | POA: Diagnosis not present

## 2017-07-20 DIAGNOSIS — R079 Chest pain, unspecified: Secondary | ICD-10-CM | POA: Diagnosis present

## 2017-07-20 DIAGNOSIS — I252 Old myocardial infarction: Secondary | ICD-10-CM | POA: Diagnosis not present

## 2017-07-20 LAB — CBC
HCT: 35.4 % (ref 35.0–47.0)
Hemoglobin: 11.6 g/dL — ABNORMAL LOW (ref 12.0–16.0)
MCH: 29.9 pg (ref 26.0–34.0)
MCHC: 32.7 g/dL (ref 32.0–36.0)
MCV: 91.4 fL (ref 80.0–100.0)
PLATELETS: 255 10*3/uL (ref 150–440)
RBC: 3.87 MIL/uL (ref 3.80–5.20)
RDW: 13.5 % (ref 11.5–14.5)
WBC: 5.6 10*3/uL (ref 3.6–11.0)

## 2017-07-20 LAB — BASIC METABOLIC PANEL
ANION GAP: 11 (ref 5–15)
BUN: 20 mg/dL (ref 6–20)
CALCIUM: 8.8 mg/dL — AB (ref 8.9–10.3)
CO2: 22 mmol/L (ref 22–32)
CREATININE: 0.94 mg/dL (ref 0.44–1.00)
Chloride: 105 mmol/L (ref 101–111)
GFR, EST NON AFRICAN AMERICAN: 57 mL/min — AB (ref >60)
GLUCOSE: 339 mg/dL — AB (ref 65–99)
Potassium: 3.5 mmol/L (ref 3.5–5.1)
Sodium: 138 mmol/L (ref 135–145)

## 2017-07-20 LAB — TROPONIN I: Troponin I: <0.03 ng/mL (ref <0.03)

## 2017-07-20 LAB — FIBRIN DERIVATIVES D-DIMER (ARMC ONLY): FIBRIN DERIVATIVES D-DIMER (ARMC): 881.35 ng{FEU}/mL — AB (ref 0.00–499.00)

## 2017-07-20 MED ORDER — ASPIRIN 81 MG PO CHEW
CHEWABLE_TABLET | ORAL | Status: AC
  Administered 2017-07-20: 11:00:00
  Filled 2017-07-20: qty 4

## 2017-07-20 MED ORDER — OMEPRAZOLE 20 MG PO CPDR: 20.0000 mg | DELAYED_RELEASE_CAPSULE | Freq: Every day | ORAL | 1 refills | Status: DC

## 2017-07-20 MED ORDER — NITROGLYCERIN 0.4 MG SL SUBL
SUBLINGUAL_TABLET | SUBLINGUAL | Status: AC
  Administered 2017-07-20: 11:00:00
  Filled 2017-07-20: qty 1

## 2017-07-20 MED ORDER — IOPAMIDOL (ISOVUE-370) INJECTION 76%
75.0000 mL | Freq: Once | INTRAVENOUS | Status: AC | PRN
Start: 2017-07-20 — End: 2017-07-20
  Administered 2017-07-20: 75 mL via INTRAVENOUS

## 2017-07-20 NOTE — ED Notes (Signed)
Patient transported to CT 

## 2017-07-20 NOTE — ED Triage Notes (Signed)
Pt presents today via [facility] with chest pain. Pt  Is A&Ox4 Pt has hx of HA and CVA in 09/2016. Pt glucose in transport was 528.

## 2017-07-20 NOTE — ED Notes (Signed)
MD notified that CTA and second troponin are resulting as negative.

## 2017-07-20 NOTE — ED Notes (Signed)
ED Provider at bedside. 

## 2017-07-20 NOTE — ED Provider Notes (Signed)
[facility] Emergency Department Provider Note    First MD Initiated Contact with Patient 07/20/17 1020     (approximate)  I have reviewed the triage vital signs and the nursing notes.   HISTORY  Chief Complaint Chest Pain    HPI Vicki [name] is a 76 y.o. female with below list of chronic medical conditions including non-ST segment elevation MI and CVA in February presents to the emergency department with acute onset of central chest pressure that is nonradiating which began 30 minutes before arrival.  Patient states that discomfort occurred while she was getting ready for church.  Patient admits to dyspnea at the time but no diaphoresis no nausea or vomiting no palpitations.  Patient states that she took a sublingual nitroglycerin persisted and as such she notified EMS.  Patient denies any lower extremity pain or swelling.  Patient states that pain is still present with a score of 6 out of 10 and described as pressure.   Past Medical History:  Diagnosis Date  . Diabetes mellitus without complication (HCC)   . GERD (gastroesophageal reflux disease)   . Hypertension   . Lesion of sciatic nerve, right side     Patient Active Problem List   Diagnosis Date Noted  . NSTEMI (non-ST elevated myocardial infarction) (HCC) 10/01/2016  . 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 Diabetes (HCC) 09/29/2016  . GERD (gastroesophageal reflux disease) 09/29/2016  . Nausea and vomiting 03/24/2016  . Diffuse abdominal pain 03/24/2016  . Hypokalemia 03/24/2016  . Chest pain 03/24/2016  . Elevated troponin I level 09/26/2015  . DKA (diabetic ketoacidoses) (HCC) 09/25/2015    Past Surgical History:  Procedure Laterality Date  . ABDOMINAL HYSTERECTOMY    . BREAST BIOPSY Right   . BREAST SURGERY    . LEFT HEART CATH AND CORONARY ANGIOGRAPHY N/A 10/01/2016   Procedure: Left Heart Cath and Coronary Angiography and possible PCI;  Surgeon: Alwyn Peawayne D Callwood, MD;   Location: [facility] INVASIVE CV LAB;  Service: Cardiovascular;  Laterality: N/A;    Prior to Admission medications   Medication Sig Start Date End Date Taking? Authorizing Provider  acetaminophen (TYLENOL) 500 MG tablet Take 1,000-1,500 mg by mouth every 6 (six) hours as needed for mild pain or headache.    [provider]  aspirin EC 81 MG EC tablet Take 1 tablet (81 mg total) by mouth daily. 03/26/16   [name], [name], MD  brimonidine-timolol (COMBIGAN) 0.2-0.5 % ophthalmic solution Place 1 drop into both eyes daily.    [provider]  cephALEXin (KEFLEX) 500 MG capsule Take 1 capsule (500 mg total) by mouth 3 (three) times daily. 05/13/17   Irean HongSung, Jade J, MD  citalopram (CELEXA) 20 MG tablet Take 20 mg by mouth daily.    [provider]  docusate sodium (COLACE) 100 MG capsule Take 1 capsule (100 mg total) by mouth 2 (two) times daily. 10/04/16   [name], [name], MD  donepezil (ARICEPT) 5 MG tablet Take 5 mg by mouth at bedtime.    [provider]  fluticasone (FLONASE) 50 MCG/ACT nasal spray Place 2 sprays into both nostrils daily.    [provider]  hydrALAZINE (APRESOLINE) 25 MG tablet Take 1 tablet (25 mg total) by mouth every 8 (eight) hours. 10/03/16   [name], [name], MD  hydrochlorothiazide (HYDRODIURIL) 12.5 MG tablet Take 1 tablet (12.5 mg total) by mouth daily. 10/03/16   [name], [name], MD  Insulin Glargine (TOUJEO SOLOSTAR) 300 UNIT/ML SOPN Inject 40 Units into  the skin at bedtime. 03/26/16   [name], [name], MD  insulin lispro (HUMALOG) 100 UNIT/ML injection Inject 0.07 mLs (7 Units total) into the skin 3 (three) times daily before meals. Pt uses per sliding scale. 03/26/16   [name], [name], MD  isosorbide mononitrate (IMDUR) 30 MG 24 hr tablet Take 1 tablet (30 mg total) by mouth daily. 03/26/16   [name], [name], MD  labetalol (NORMODYNE) 100 MG tablet Take 100 mg by mouth 2 (two) times daily.    [provider]  loratadine (CLARITIN) 10 MG  tablet Take 10 mg by mouth daily.    [provider]  lovastatin (MEVACOR) 20 MG tablet Take 40 mg by mouth at bedtime.     [provider]  magnesium oxide (MAG-OX) 400 MG tablet Take 400 mg by mouth daily.    [provider]  Multiple Vitamins-Minerals (CENTRUM ADULTS) TABS Take by mouth.    [provider]  omeprazole (PRILOSEC) 20 MG capsule Take 1 capsule (20 mg total) by mouth daily. 09/28/15   Katha HammingKonidena, Snehalatha, MD  omeprazole (PRILOSEC) 20 MG capsule Take 1 capsule (20 mg total) by mouth daily. 07/20/17 08/19/17  Darci CurrentBrown, [place] N, MD  ondansetron (ZOFRAN ODT) 4 MG disintegrating tablet Take 1 tablet (4 mg total) by mouth every 8 (eight) hours as needed for nausea or vomiting. 05/13/17   Irean HongSung, Jade J, MD  ondansetron (ZOFRAN) 4 MG tablet Take 1 tablet (4 mg total) by mouth every 8 (eight) hours as needed for nausea or vomiting. 10/03/16   [name], [name], MD  polyethylene glycol [facility](MIRALAX / Ethelene HalGLYCOLAX) packet Take 17 g by mouth daily. Patient not taking: Reported on 09/25/2015 05/03/15   Emily FilbertWilliams, Jonathan E, MD  polyethylene glycol [facility](MIRALAX / Ethelene HalGLYCOLAX) packet Take 17 g by mouth daily. 10/04/16   [name], [name], MD  quinapril (ACCUPRIL) 20 MG tablet Take 2 tablets (40 mg total) by mouth daily. 10/01/16   [name], [name], MD    Allergies No known drug allergies  Family History  Problem Relation Age of Onset  . Heart disease Mother   . Diabetes Mellitus II Father   . Hypertension Sister   . Diabetes Mellitus II Sister   . Breast cancer Sister [id number]  . Hypertension Brother   . Diabetes Mellitus II Brother     Social History Social History   Tobacco Use  . Smoking status: Never Smoker  . Smokeless tobacco: Never Used  Substance Use Topics  . Alcohol use: No  . Drug use: No    Review of Systems Constitutional: No fever/chills Eyes: No visual changes. ENT: No sore throat. Cardiovascular: Positive for chest pain. Respiratory: Denies shortness of  breath. Gastrointestinal: No abdominal pain.  No nausea, no vomiting.  No diarrhea.  No constipation. Genitourinary: Negative for dysuria. Musculoskeletal: Negative for neck pain.  Negative for back pain. Integumentary: Negative for rash. Neurological: Negative for headaches, focal weakness or numbness.   ____________________________________________   PHYSICAL EXAM:  VITAL SIGNS: ED Triage Vitals  Enc Vitals Group     BP 07/20/17 1030 (!) 167/63     Pulse --      Resp 07/20/17 1030 19     Temp 07/20/17 1030 98.3 F (36.8 C)     Temp Source 07/20/17 1030 Oral     SpO2 07/20/17 1030 100 %     Weight 07/20/17 1034 70.3 kg (155 lb)     Height 07/20/17 1034 1.575 m (5\' 2" )     Head Circumference --  Peak Flow --      Pain Score 07/20/17 1029 2     Pain Loc --      Pain Edu? --      Excl. in GC? --     Constitutional: Alert and oriented. Well appearing and in no acute distress. Eyes: Conjunctivae are normal.  Head: Atraumatic. Mouth/Throat: Mucous membranes are moist.  Oropharynx non-erythematous. Neck: No stridor.   Cardiovascular: Normal rate, regular rhythm. Good peripheral circulation. Grossly normal heart sounds. Respiratory: Normal respiratory effort.  No retractions. Lungs CTAB. Gastrointestinal: Soft and nontender. No distention.   Musculoskeletal: No lower extremity tenderness nor edema. No gross deformities of extremities. Neurologic:  Normal speech and language. No gross focal neurologic deficits are appreciated.  Skin:  Skin is warm, dry and intact. No rash noted. Psychiatric: Mood and affect are normal. Speech and behavior are normal.  ____________________________________________   LABS (all labs ordered are listed, but only abnormal results are displayed)  Labs Reviewed  BASIC METABOLIC PANEL - Abnormal; Notable for the following components:      Result Value   Glucose, Bld 339 (*)    Calcium 8.8 (*)    GFR calc non Af Amer 57 (*)    All other  components within normal limits  CBC - Abnormal; Notable for the following components:   Hemoglobin 11.6 (*)    All other components within normal limits  FIBRIN DERIVATIVES D-DIMER ([facility] ONLY) - Abnormal; Notable for the following components:   Fibrin derivatives D-dimer ([facility]) 881.35 (*)    All other components within normal limits  TROPONIN I  TROPONIN I   ____________________________________________  EKG  ED ECG REPORT I, [place] N Merlen Gurry, the attending physician, personally viewed and interpreted this ECG.   Date: 07/20/2017  EKG Time: 10:37 AM  Rate: 81  Rhythm: Normal sinus rhythm  Axis: Normal  Intervals: Normal  ST&T Change: None  ____________________________________________  RADIOLOGY I, [place] N Diana Armijo, personally viewed and evaluated these images (plain radiographs) as part of my medical decision making, as well as reviewing the written report by the radiologist.  Ct Angio Chest Pe W And/or Wo Contrast  Result Date: 07/20/2017 CLINICAL DATA:  Chest pain EXAM: CT ANGIOGRAPHY CHEST WITH CONTRAST TECHNIQUE: Multidetector CT imaging of the chest was performed using the standard protocol during bolus administration of intravenous contrast. Multiplanar CT image reconstructions and MIPs were obtained to evaluate the vascular anatomy. CONTRAST:  75mL ISOVUE-370 IOPAMIDOL (ISOVUE-370) INJECTION 76% COMPARISON:  02/06/2017 CT abdomen pelvis FINDINGS: Cardiovascular: There are no filling defects in the pulmonary arterial tree to suggest acute pulmonary thromboembolism. Atherosclerotic calcifications and plaque in the aortic arch. Minimal right coronary artery calcification. Minimal LAD territory calcification. No evidence of aortic aneurysm or dissection. Mediastinum/Nodes: No evidence of abnormal mediastinal adenopathy. There is wall thickening of the esophagus involving the mid and distal thirds. No pericardial effusion. Thyroid is unremarkable. Lungs/Pleura: No pneumothorax. No  pleural effusion. Dependent atelectasis in the lungs. Upper Abdomen: No acute abnormality. Musculoskeletal: No acute rib fracture. T11 and T12 superior endplate compression deformities are stable. Review of the MIP images confirms the above findings. IMPRESSION: No evidence of acute pulmonary thromboembolism There is wall thickening involving the distal 2/3 of the esophagus. An inflammatory process is not excluded. Consider esophagram when feasible. Aortic Atherosclerosis (ICD10-I70.0). Electronically Signed   By: Jolaine Click M.D.   On: 07/20/2017 13:51   Dg Chest Port 1 View  Result Date: 07/20/2017 CLINICAL DATA:  New onset chest pain. EXAM:  PORTABLE CHEST 1 VIEW COMPARISON:  09/29/2016 FINDINGS: Cardiomediastinal silhouette is normal. Mediastinal contours appear intact. Tortuosity and calcific atherosclerotic disease of the aorta. There is no evidence of focal airspace consolidation, pleural effusion or pneumothorax. Osseous structures are without acute abnormality. Soft tissues are grossly normal. IMPRESSION: No active disease. Electronically Signed   By: Ted Mcalpine M.D.   On: 07/20/2017 11:29     Procedures   ____________________________________________   INITIAL IMPRESSION / ASSESSMENT AND PLAN / ED COURSE  As part of my medical decision making, I reviewed the following data within the electronic MEDICAL RECORD NUMBER60 year old female presenting with above-stated history of chest pain.  Patient received aspirin 3 and 24 mg and 1 sublingual nitroglycerin with complete resolution of pain.  EKG revealed no evidence of ischemia or infarction laboratory data negative including troponin x2.  D-dimer noted to be elevated at 881 and as such CT scan of the chest was performed to evaluate for pulmonary emboli.  No PE noted thickening of the distal esophagus noted.  Patient will be referred to primary care provider for further outpatient evaluation and  management ____________________________________________  FINAL CLINICAL IMPRESSION(S) / ED DIAGNOSES  Final diagnoses:  Nonspecific chest pain  Hyperglycemia     MEDICATIONS GIVEN DURING THIS VISIT:  Medications  aspirin 81 MG chewable tablet (  Given 07/20/17 1041)  nitroGLYCERIN (NITROSTAT) 0.4 MG SL tablet (  Given 07/20/17 1041)  iopamidol (ISOVUE-370) 76 % injection 75 mL (75 mLs Intravenous Contrast Given 07/20/17 1307)     ED Discharge Orders        Ordered    omeprazole (PRILOSEC) 20 MG capsule  Daily     07/20/17 1429       Note:  This document was prepared using Dragon voice recognition software and may include unintentional dictation errors.    Darci Current, MD 07/20/17 [phone]

## 2017-07-20 NOTE — ED Notes (Signed)
MD notified of positive D-dimer.

## 2017-08-14 ENCOUNTER — Encounter: Payer: Self-pay | Admitting: Emergency Medicine

## 2017-08-14 ENCOUNTER — Emergency Department
Admission: EM | Admit: 2017-08-14 | Discharge: 2017-08-14 | Disposition: A | Discharge status: Home / Self Care | Payer: Medicare HMO | Attending: Emergency Medicine | Admitting: Emergency Medicine

## 2017-08-14 ENCOUNTER — Other Ambulatory Visit: Payer: Self-pay

## 2017-08-14 DIAGNOSIS — R454 Irritability and anger: Secondary | ICD-10-CM | POA: Diagnosis not present

## 2017-08-14 DIAGNOSIS — Z79899 Other long term (current) drug therapy: Secondary | ICD-10-CM | POA: Insufficient documentation

## 2017-08-14 DIAGNOSIS — R456 Violent behavior: Secondary | ICD-10-CM | POA: Diagnosis present

## 2017-08-14 DIAGNOSIS — F0390 Unspecified dementia without behavioral disturbance: Secondary | ICD-10-CM

## 2017-08-14 DIAGNOSIS — I252 Old myocardial infarction: Secondary | ICD-10-CM | POA: Diagnosis not present

## 2017-08-14 DIAGNOSIS — G309 Alzheimer's disease, unspecified: Secondary | ICD-10-CM | POA: Insufficient documentation

## 2017-08-14 DIAGNOSIS — I10 Essential (primary) hypertension: Secondary | ICD-10-CM | POA: Diagnosis not present

## 2017-08-14 DIAGNOSIS — Z794 Long term (current) use of insulin: Secondary | ICD-10-CM | POA: Insufficient documentation

## 2017-08-14 DIAGNOSIS — E119 Type 2 diabetes mellitus without complications: Secondary | ICD-10-CM | POA: Insufficient documentation

## 2017-08-14 DIAGNOSIS — Z046 Encounter for general psychiatric examination, requested by authority: Secondary | ICD-10-CM | POA: Insufficient documentation

## 2017-08-14 DIAGNOSIS — Z7982 Long term (current) use of aspirin: Secondary | ICD-10-CM | POA: Diagnosis not present

## 2017-08-14 DIAGNOSIS — F4325 Adjustment disorder with mixed disturbance of emotions and conduct: Secondary | ICD-10-CM

## 2017-08-14 DIAGNOSIS — F03A0 Unspecified dementia, mild, without behavioral disturbance, psychotic disturbance, mood disturbance, and anxiety: Secondary | ICD-10-CM

## 2017-08-14 LAB — ETHANOL: Alcohol, Ethyl (B): <10 mg/dL (ref <10)

## 2017-08-14 LAB — URINE DRUG SCREEN, QUALITATIVE (ARMC ONLY)
Amphetamines, Ur Screen: NOT DETECTED
Barbiturates, Ur Screen: NOT DETECTED
Benzodiazepine, Ur Scrn: NOT DETECTED
CANNABINOID 50 NG, UR ~~LOC~~: NOT DETECTED
COCAINE METABOLITE, UR ~~LOC~~: NOT DETECTED
MDMA (ECSTASY) UR SCREEN: NOT DETECTED
Methadone Scn, Ur: NOT DETECTED
Opiate, Ur Screen: NOT DETECTED
PHENCYCLIDINE (PCP) UR S: NOT DETECTED
TRICYCLIC, UR SCREEN: NOT DETECTED

## 2017-08-14 LAB — COMPREHENSIVE METABOLIC PANEL
ALBUMIN: 3.8 g/dL (ref 3.5–5.0)
ALT: 17 U/L (ref 14–54)
ANION GAP: 13 (ref 5–15)
AST: 37 U/L (ref 15–41)
Alkaline Phosphatase: 74 U/L (ref 38–126)
BUN: 26 mg/dL — ABNORMAL HIGH (ref 6–20)
CO2: 21 mmol/L — ABNORMAL LOW (ref 22–32)
Calcium: 9.2 mg/dL (ref 8.9–10.3)
Chloride: 103 mmol/L (ref 101–111)
Creatinine, Ser: 1.01 mg/dL — ABNORMAL HIGH (ref 0.44–1.00)
GFR calc Af Amer: >60 mL/min (ref >60)
GFR calc non Af Amer: 52 mL/min — ABNORMAL LOW (ref >60)
GLUCOSE: 315 mg/dL — AB (ref 65–99)
POTASSIUM: 3 mmol/L — AB (ref 3.5–5.1)
Sodium: 137 mmol/L (ref 135–145)
Total Bilirubin: 0.5 mg/dL (ref 0.3–1.2)
Total Protein: 7 g/dL (ref 6.5–8.1)

## 2017-08-14 LAB — CBC
HCT: 37.5 % (ref 35.0–47.0)
HEMOGLOBIN: 12.4 g/dL (ref 12.0–16.0)
MCH: 30 pg (ref 26.0–34.0)
MCHC: 33 g/dL (ref 32.0–36.0)
MCV: 91 fL (ref 80.0–100.0)
Platelets: 263 10*3/uL (ref 150–440)
RBC: 4.12 MIL/uL (ref 3.80–5.20)
RDW: 13.8 % (ref 11.5–14.5)
WBC: 6.5 10*3/uL (ref 3.6–11.0)

## 2017-08-14 MED ORDER — ACETAMINOPHEN 500 MG PO TABS
1000.0000 mg | ORAL_TABLET | Freq: Once | ORAL | Status: AC
  Administered 2017-08-14: 1000 mg via ORAL
  Filled 2017-08-14: qty 2

## 2017-08-14 MED ORDER — SODIUM CHLORIDE 0.9 % IV BOLUS (SEPSIS)
1000.0000 mL | Freq: Once | INTRAVENOUS | Status: AC
  Administered 2017-08-14: 1000 mL via INTRAVENOUS

## 2017-08-14 NOTE — ED Notes (Signed)
Dr Toni Amendlapacs rescinded IVC papers/MD Derrill KayGoodman, RN Carollee HerterShannon and [employer] security officer made aware.

## 2017-08-14 NOTE — ED Notes (Signed)

## 2017-08-14 NOTE — Discharge Instructions (Signed)
Please seek medical attention for any high fevers, chest pain, persistent vomiting, or any other new or concerning symptoms.

## 2017-08-14 NOTE — ED Notes (Signed)
Pt given lunch tray. Pt is lying in bed watching TV. Pt given blanket
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
## 2017-08-14 NOTE — ED Notes (Signed)
[place] [facility] [place] [facility]MD Shaune PollackLord, RN Amy T. And [facility] officer Jaci StandardLyon made aware.

## 2017-08-14 NOTE — ED Notes (Signed)
Pt dressed in wine colored scrubs by this RN.

## 2017-08-14 NOTE — ED Notes (Signed)
BEHAVIORAL HEALTH ROUNDING Patient sleeping: No. Patient alert and oriented: yes Behavior appropriate: Yes.  ; If no, describe:  Nutrition and fluids offered: yes Toileting and hygiene offered: Yes  Sitter present: q15 minute observations and security  monitoring Law enforcement present: Yes  ODS  
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
## 2017-12-02 ENCOUNTER — Observation Stay
Admission: EM | Admit: 2017-12-02 | Discharge: 2017-12-04 | Disposition: A | Discharge status: Home / Self Care | Payer: Medicare Other | Attending: Internal Medicine | Admitting: Internal Medicine

## 2017-12-02 ENCOUNTER — Emergency Department: Payer: Medicare Other

## 2017-12-02 ENCOUNTER — Other Ambulatory Visit: Payer: Self-pay

## 2017-12-02 ENCOUNTER — Observation Stay
Admit: 2017-12-02 | Discharge: 2017-12-02 | Disposition: A | Discharge status: Home / Self Care | Payer: Medicare Other | Attending: Internal Medicine | Admitting: Internal Medicine

## 2017-12-02 ENCOUNTER — Encounter: Payer: Self-pay | Admitting: Emergency Medicine

## 2017-12-02 DIAGNOSIS — R0789 Other chest pain: Secondary | ICD-10-CM | POA: Diagnosis not present

## 2017-12-02 DIAGNOSIS — Z7982 Long term (current) use of aspirin: Secondary | ICD-10-CM | POA: Diagnosis not present

## 2017-12-02 DIAGNOSIS — N289 Disorder of kidney and ureter, unspecified: Secondary | ICD-10-CM | POA: Diagnosis present

## 2017-12-02 DIAGNOSIS — I252 Old myocardial infarction: Secondary | ICD-10-CM | POA: Diagnosis not present

## 2017-12-02 DIAGNOSIS — I700 Atherosclerosis of aorta: Secondary | ICD-10-CM | POA: Diagnosis not present

## 2017-12-02 DIAGNOSIS — R9431 Abnormal electrocardiogram [ECG] [EKG]: Secondary | ICD-10-CM

## 2017-12-02 DIAGNOSIS — R110 Nausea: Secondary | ICD-10-CM

## 2017-12-02 DIAGNOSIS — Z8673 Personal history of transient ischemic attack (TIA), and cerebral infarction without residual deficits: Secondary | ICD-10-CM | POA: Diagnosis not present

## 2017-12-02 DIAGNOSIS — R55 Syncope and collapse: Secondary | ICD-10-CM

## 2017-12-02 DIAGNOSIS — K219 Gastro-esophageal reflux disease without esophagitis: Secondary | ICD-10-CM | POA: Diagnosis not present

## 2017-12-02 DIAGNOSIS — E162 Hypoglycemia, unspecified: Secondary | ICD-10-CM

## 2017-12-02 DIAGNOSIS — I10 Essential (primary) hypertension: Secondary | ICD-10-CM | POA: Insufficient documentation

## 2017-12-02 DIAGNOSIS — I2510 Atherosclerotic heart disease of native coronary artery without angina pectoris: Secondary | ICD-10-CM | POA: Insufficient documentation

## 2017-12-02 DIAGNOSIS — Z79899 Other long term (current) drug therapy: Secondary | ICD-10-CM | POA: Diagnosis not present

## 2017-12-02 DIAGNOSIS — E10649 Type 1 diabetes mellitus with hypoglycemia without coma: Secondary | ICD-10-CM | POA: Diagnosis not present

## 2017-12-02 DIAGNOSIS — R079 Chest pain, unspecified: Secondary | ICD-10-CM | POA: Diagnosis present

## 2017-12-02 HISTORY — DX: Cerebral infarction, unspecified: I63.9

## 2017-12-02 HISTORY — DX: Acute myocardial infarction, unspecified: I21.9

## 2017-12-02 LAB — CBC
HCT: 41.1 % (ref 35.0–47.0)
Hemoglobin: 13.8 g/dL (ref 12.0–16.0)
MCH: 30 pg (ref 26.0–34.0)
MCHC: 33.6 g/dL (ref 32.0–36.0)
MCV: 89.3 fL (ref 80.0–100.0)
PLATELETS: 318 10*3/uL (ref 150–440)
RBC: 4.6 MIL/uL (ref 3.80–5.20)
RDW: 13.5 % (ref 11.5–14.5)
WBC: 6.5 10*3/uL (ref 3.6–11.0)

## 2017-12-02 LAB — GLUCOSE, CAPILLARY
GLUCOSE-CAPILLARY: 344 mg/dL — AB (ref 65–99)
Glucose-Capillary: 46 mg/dL — ABNORMAL LOW (ref 65–99)
Glucose-Capillary: 89 mg/dL (ref 65–99)
Glucose-Capillary: 109 mg/dL — ABNORMAL HIGH (ref 65–99)
Glucose-Capillary: 144 mg/dL — ABNORMAL HIGH (ref 65–99)

## 2017-12-02 LAB — BASIC METABOLIC PANEL
Anion gap: 9 (ref 5–15)
BUN: 27 mg/dL — AB (ref 6–20)
CO2: 30 mmol/L (ref 22–32)
CREATININE: 1.12 mg/dL — AB (ref 0.44–1.00)
Calcium: 9.5 mg/dL (ref 8.9–10.3)
Chloride: 99 mmol/L — ABNORMAL LOW (ref 101–111)
GFR calc Af Amer: 53 mL/min — ABNORMAL LOW (ref >60)
GFR calc non Af Amer: 45 mL/min — ABNORMAL LOW (ref >60)
Glucose, Bld: 67 mg/dL (ref 65–99)
Potassium: 3.6 mmol/L (ref 3.5–5.1)
Sodium: 138 mmol/L (ref 135–145)

## 2017-12-02 LAB — URINALYSIS, COMPLETE (UACMP) WITH MICROSCOPIC
BILIRUBIN URINE: NEGATIVE
GLUCOSE, UA: 150 mg/dL — AB
HGB URINE DIPSTICK: NEGATIVE
Ketones, ur: NEGATIVE mg/dL
NITRITE: NEGATIVE
Protein, ur: 30 mg/dL — AB
SPECIFIC GRAVITY, URINE: 1.02 (ref 1.005–1.030)
pH: 5 (ref 5.0–8.0)

## 2017-12-02 LAB — HEMOGLOBIN A1C
Hgb A1c MFr Bld: 10.4 % — ABNORMAL HIGH (ref 4.8–5.6)
MEAN PLASMA GLUCOSE: 251.78 mg/dL

## 2017-12-02 LAB — TROPONIN I
Troponin I: <0.03 ng/mL (ref <0.03)
Troponin I: <0.03 ng/mL (ref <0.03)
Troponin I: <0.03 ng/mL (ref <0.03)

## 2017-12-02 LAB — TSH: TSH: 0.523 u[IU]/mL (ref 0.350–4.500)

## 2017-12-02 MED ORDER — HYDRALAZINE HCL 20 MG/ML IJ SOLN
10.0000 mg | Freq: Once | INTRAMUSCULAR | Status: AC
  Administered 2017-12-02: 10 mg via INTRAVENOUS
  Filled 2017-12-02: qty 1

## 2017-12-02 MED ORDER — QUINAPRIL HCL 10 MG PO TABS
40.0000 mg | ORAL_TABLET | Freq: Every day | ORAL | Status: DC
  Administered 2017-12-03 – 2017-12-04 (×2): 40 mg via ORAL
  Filled 2017-12-02 (×2): qty 4

## 2017-12-02 MED ORDER — ASPIRIN EC 81 MG PO TBEC
81.0000 mg | DELAYED_RELEASE_TABLET | Freq: Every day | ORAL | Status: DC
  Administered 2017-12-03 – 2017-12-04 (×2): 81 mg via ORAL
  Filled 2017-12-02 (×2): qty 1

## 2017-12-02 MED ORDER — ACETAMINOPHEN 650 MG RE SUPP: 650.0000 mg | Freq: Four times a day (QID) | RECTAL | Status: DC | PRN

## 2017-12-02 MED ORDER — HYDRALAZINE HCL 50 MG PO TABS
25.0000 mg | ORAL_TABLET | Freq: Three times a day (TID) | ORAL | Status: DC
  Administered 2017-12-02 – 2017-12-04 (×4): 25 mg via ORAL
  Filled 2017-12-02 (×5): qty 1

## 2017-12-02 MED ORDER — BRIMONIDINE TARTRATE 0.2 % OP SOLN
1.0000 [drp] | Freq: Every day | OPHTHALMIC | Status: DC
  Administered 2017-12-03 – 2017-12-04 (×2): 1 [drp] via OPHTHALMIC
  Filled 2017-12-02: qty 5

## 2017-12-02 MED ORDER — CITALOPRAM HYDROBROMIDE 20 MG PO TABS
20.0000 mg | ORAL_TABLET | Freq: Every day | ORAL | Status: DC
  Administered 2017-12-03 – 2017-12-04 (×2): 20 mg via ORAL
  Filled 2017-12-02 (×2): qty 1

## 2017-12-02 MED ORDER — SODIUM CHLORIDE 0.9% FLUSH
3.0000 mL | Freq: Two times a day (BID) | INTRAVENOUS | Status: DC
  Administered 2017-12-02 – 2017-12-03 (×3): 3 mL via INTRAVENOUS

## 2017-12-02 MED ORDER — NITROGLYCERIN 2 % TD OINT
0.5000 [in_us] | TOPICAL_OINTMENT | Freq: Four times a day (QID) | TRANSDERMAL | Status: DC
  Administered 2017-12-02 – 2017-12-04 (×6): 0.5 [in_us] via TOPICAL
  Filled 2017-12-02 (×6): qty 1

## 2017-12-02 MED ORDER — DONEPEZIL HCL 5 MG PO TABS
5.0000 mg | ORAL_TABLET | Freq: Every day | ORAL | Status: DC
  Administered 2017-12-02 – 2017-12-03 (×2): 5 mg via ORAL
  Filled 2017-12-02 (×3): qty 1

## 2017-12-02 MED ORDER — ADULT MULTIVITAMIN W/MINERALS CH
1.0000 | ORAL_TABLET | Freq: Every day | ORAL | Status: DC
  Administered 2017-12-03 – 2017-12-04 (×2): 1 via ORAL
  Filled 2017-12-02 (×2): qty 1

## 2017-12-02 MED ORDER — PRAVASTATIN SODIUM 40 MG PO TABS
40.0000 mg | ORAL_TABLET | Freq: Every day | ORAL | Status: DC
  Administered 2017-12-03: 40 mg via ORAL
  Filled 2017-12-02: qty 1

## 2017-12-02 MED ORDER — ONDANSETRON HCL 4 MG PO TABS: 4.0000 mg | ORAL_TABLET | Freq: Four times a day (QID) | ORAL | Status: DC | PRN

## 2017-12-02 MED ORDER — PANTOPRAZOLE SODIUM 40 MG PO TBEC
40.0000 mg | DELAYED_RELEASE_TABLET | Freq: Every day | ORAL | Status: DC
  Administered 2017-12-03 – 2017-12-04 (×2): 40 mg via ORAL
  Filled 2017-12-02 (×2): qty 1

## 2017-12-02 MED ORDER — INSULIN GLARGINE 100 UNIT/ML ~~LOC~~ SOLN
25.0000 [IU] | Freq: Every day | SUBCUTANEOUS | Status: DC
  Administered 2017-12-02: 25 [IU] via SUBCUTANEOUS
  Filled 2017-12-02 (×2): qty 0.25

## 2017-12-02 MED ORDER — HYDROCHLOROTHIAZIDE 25 MG PO TABS
12.5000 mg | ORAL_TABLET | Freq: Every day | ORAL | Status: DC
  Administered 2017-12-03 – 2017-12-04 (×2): 12.5 mg via ORAL
  Filled 2017-12-02 (×2): qty 1

## 2017-12-02 MED ORDER — ONDANSETRON HCL 4 MG/2ML IJ SOLN
4.0000 mg | Freq: Four times a day (QID) | INTRAMUSCULAR | Status: DC | PRN
  Administered 2017-12-03: 4 mg via INTRAVENOUS
  Filled 2017-12-02: qty 2

## 2017-12-02 MED ORDER — ENOXAPARIN SODIUM 40 MG/0.4ML ~~LOC~~ SOLN
40.0000 mg | SUBCUTANEOUS | Status: DC
  Administered 2017-12-02 – 2017-12-03 (×2): 40 mg via SUBCUTANEOUS
  Filled 2017-12-02 (×2): qty 0.4

## 2017-12-02 MED ORDER — ACETAMINOPHEN 325 MG PO TABS: 650.0000 mg | ORAL_TABLET | Freq: Four times a day (QID) | ORAL | Status: DC | PRN

## 2017-12-02 MED ORDER — TIMOLOL MALEATE 0.5 % OP SOLN
1.0000 [drp] | Freq: Every day | OPHTHALMIC | Status: DC
  Administered 2017-12-03 – 2017-12-04 (×2): 1 [drp] via OPHTHALMIC
  Filled 2017-12-02: qty 5

## 2017-12-02 MED ORDER — INSULIN ASPART 100 UNIT/ML ~~LOC~~ SOLN
0.0000 [IU] | Freq: Every day | SUBCUTANEOUS | Status: DC
  Administered 2017-12-02: 4 [IU] via SUBCUTANEOUS
  Filled 2017-12-02: qty 1

## 2017-12-02 MED ORDER — MAGNESIUM OXIDE 400 (241.3 MG) MG PO TABS
400.0000 mg | ORAL_TABLET | Freq: Every day | ORAL | Status: DC
  Administered 2017-12-03 – 2017-12-04 (×2): 400 mg via ORAL
  Filled 2017-12-02 (×2): qty 1

## 2017-12-02 MED ORDER — INSULIN ASPART 100 UNIT/ML ~~LOC~~ SOLN
0.0000 [IU] | Freq: Three times a day (TID) | SUBCUTANEOUS | Status: DC
  Administered 2017-12-04 (×2): 7 [IU] via SUBCUTANEOUS
  Filled 2017-12-02 (×2): qty 1

## 2017-12-02 MED ORDER — ASPIRIN 81 MG PO CHEW
324.0000 mg | CHEWABLE_TABLET | Freq: Once | ORAL | Status: AC
  Administered 2017-12-02: 324 mg via ORAL
  Filled 2017-12-02: qty 4

## 2017-12-02 MED ORDER — LABETALOL HCL 100 MG PO TABS
100.0000 mg | ORAL_TABLET | Freq: Two times a day (BID) | ORAL | Status: DC
  Administered 2017-12-02 – 2017-12-04 (×3): 100 mg via ORAL
  Filled 2017-12-02 (×5): qty 1

## 2017-12-02 MED ORDER — HYDRALAZINE HCL 20 MG/ML IJ SOLN: 10.0000 mg | Freq: Four times a day (QID) | INTRAMUSCULAR | Status: DC | PRN

## 2017-12-02 MED ORDER — ISOSORBIDE MONONITRATE ER 60 MG PO TB24
30.0000 mg | ORAL_TABLET | Freq: Every day | ORAL | Status: DC
  Administered 2017-12-03 – 2017-12-04 (×2): 30 mg via ORAL
  Filled 2017-12-02 (×2): qty 1

## 2017-12-02 MED ORDER — FLUTICASONE PROPIONATE 50 MCG/ACT NA SUSP
2.0000 | Freq: Every day | NASAL | Status: DC
Start: 2017-12-03 — End: 2017-12-04
  Administered 2017-12-03 – 2017-12-04 (×2): 2 via NASAL
  Filled 2017-12-02: qty 16

## 2017-12-02 MED ORDER — BRIMONIDINE TARTRATE-TIMOLOL 0.2-0.5 % OP SOLN
1.0000 [drp] | Freq: Every day | OPHTHALMIC | Status: DC
  Filled 2017-12-02: qty 5

## 2017-12-02 MED ORDER — LORATADINE 10 MG PO TABS
10.0000 mg | ORAL_TABLET | Freq: Every day | ORAL | Status: DC
  Administered 2017-12-03 – 2017-12-04 (×2): 10 mg via ORAL
  Filled 2017-12-02 (×2): qty 1

## 2017-12-02 NOTE — ED Notes (Signed)
Warm blanket provided to pt.

## 2017-12-02 NOTE — ED Notes (Signed)
Pt drank some orange juice. Alert, oriented, speaking in complete sentences. Informed will recheck CBG in a few minutes.

## 2017-12-02 NOTE — ED Triage Notes (Signed)
Pt to ED from Dr. Wilmon Arms office c/o chest pain, SOB x2 days.  Was at PCP for routine check up but EKG showed new changes.  Chest pain center and is dull but sharp when she moves around, SOB also worse with exertion.  Denies n/v.  Chest rise even and unlabored upon arrival, skin warm and dry.

## 2017-12-02 NOTE — ED Provider Notes (Addendum)
[facility] Emergency Department Provider Note  ____________________________________________  Time seen: Approximately 3:20 PM  I have reviewed the triage vital signs and the nursing notes.   HISTORY  Chief Complaint Chest Pain and Shortness of Breath    HPI Vicki [name] is a 77 y.o. female with a history of CAD status post MI, HTN, DM, presenting with chest pain, syncope, and hypoglycemia.  The patient reports that approximately 1 month ago, she was started on a diuretic by her cardiologist, Dr. Vennie Homans, for lower extremity edema.  Her lower extremity edema has significantly improved.  Over the past week, she has noted that she has a central and left-sided chest "pressure" every time she exerts herself, for example when she is walking to the [employer].  When she has these episodes, it is associated with shortness of breath, diaphoresis, nausea without vomiting, and visual changes like she may pass out.  She did have one episode of syncope.  Her CP improves with SL Nitro. In addition, she has not had any recent changes to her diabetes medicines, but is having recurrent hypoglycemia at home where her blood sugar is dropping into the 40s and she is feeling poorly with that.  Today, she went to see her primary care physician, Dr. Graciela Husbands, and was found to have a blood sugar in the 40s so sent to the emergency department for further evaluation.  No cough or cold symptoms, fever or chills, vomiting, diarrhea or constipation.  Her last cardiac catheterization occurred in the setting of an end STEMI 10/01/2016 with 50% stenosis of the distal circumflex, no intervention.  The patient takes a low-dose aspirin daily.  Past Medical History:  Diagnosis Date  . Diabetes mellitus without complication (HCC)   . GERD (gastroesophageal reflux disease)   . Hypertension   . Lesion of sciatic nerve, right side   . MI (myocardial infarction) (HCC)   . Stroke [facility])     Patient  Active Problem List   Diagnosis Date Noted  . 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 NSTEMI (non-ST elevated myocardial infarction) (HCC) 10/01/2016  . 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 Diabetes (HCC) 09/29/2016  . GERD (gastroesophageal reflux disease) 09/29/2016  . Nausea and vomiting 03/24/2016  . Diffuse abdominal pain 03/24/2016  . Hypokalemia 03/24/2016  . Chest pain 03/24/2016  . Elevated troponin I level 09/26/2015  . DKA (diabetic ketoacidoses) (HCC) 09/25/2015    Past Surgical History:  Procedure Laterality Date  . ABDOMINAL HYSTERECTOMY    . BREAST BIOPSY Right   . BREAST SURGERY    . LEFT HEART CATH AND CORONARY ANGIOGRAPHY N/A 10/01/2016   Procedure: Left Heart Cath and Coronary Angiography and possible PCI;  Surgeon: Alwyn Pea, MD;  Location: [facility] INVASIVE CV LAB;  Service: Cardiovascular;  Laterality: N/A;    Current Outpatient Rx  . Order #: [id number] Class: Historical Med  . Order #: [id number] Class: Normal  . Order #: [id number] Class: Historical Med  . Order #: [id number] Class: Print  . Order #: [id number] Class: Historical Med  . Order #: [id number] Class: Normal  . Order #: [id number] Class: Historical Med  . Order #: [id number] Class: Historical Med  . Order #: [id number] Class: Normal  . Order #: [id number] Class: Normal  . Order #: [id number] Class: Normal  . Order #: [id number] Class: Normal  . Order #: [id number] Class: Normal  . Order #: [id number] Class: Historical Med  . Order #: [id number] Class: Historical Med  . Order #: [id number] Class:  Historical Med  . Order #: [id number] Class: Historical Med  . Order #: [id number] Class: Historical Med  . Order #: [id number] Class: Normal  . Order #: [id number] Class: Print  . Order #: [id number] Class: Print  . Order #: [id number] Class: Normal  . Order #: [id number] Class: Print  . Order #: [id number] Class: Normal  . Order #: [id number] Class: Normal     Allergies Patient has no known allergies.  Family History  Problem Relation Age of Onset  . Heart disease Mother   . Diabetes Mellitus II Father   . Hypertension Sister   . Diabetes Mellitus II Sister   . Breast cancer Sister 66  . Hypertension Brother   . Diabetes Mellitus II Brother     Social History Social History   Tobacco Use  . Smoking status: Never Smoker  . Smokeless tobacco: Never Used  Substance Use Topics  . Alcohol use: No  . Drug use: No    Review of Systems Constitutional: No fever/chills.  Positive lightheaded.  Positive episode of syncope several days ago.  Positive diaphoresis. Eyes: Positive "spotty" vision with exertion. ENT: No sore throat. No congestion or rhinorrhea. Cardiovascular: Positive chest pain. Denies palpitations. Respiratory: Positive shortness of breath.  No cough. Gastrointestinal: No abdominal pain.  Positive nausea, no vomiting.  No diarrhea.  No constipation.  Positive new anorexia. Genitourinary: Negative for dysuria. Musculoskeletal: Negative for back pain. Skin: Negative for rash. Neurological: Negative for headaches. No focal numbness, tingling or weakness.  ENDOCRINE: Positive recurrent episodes of hypoglycemia that are symptomatic; no recent changes in her diabetes medications    ____________________________________________   PHYSICAL EXAM:  VITAL SIGNS: ED Triage Vitals  Enc Vitals Group     BP 12/02/17 1201 (!) 176/68     Pulse Rate 12/02/17 1201 64     Resp 12/02/17 1201 16     Temp 12/02/17 1201 98.7 F (37.1 C)     Temp Source 12/02/17 1201 Oral     SpO2 12/02/17 1201 100 %     Weight 12/02/17 1203 150 lb (68 kg)     Height 12/02/17 1203  (1.575 m)     Head Circumference --      Peak Flow --      Pain Score 12/02/17 1203 0     Pain Loc --      Pain Edu? --      Excl. in GC? --     Constitutional: Alert and oriented.  Chronically ill appearing but in no acute distress. Answers questions  appropriately. Eyes: Conjunctivae are normal.  EOMI. No scleral icterus. Head: Atraumatic. Nose: No congestion/rhinnorhea. Mouth/Throat: Mucous membranes are moist.  Neck: No stridor.  Supple.  No JVD.  No meningismus. Cardiovascular: Normal rate, regular rhythm. No murmurs, rubs or gallops.  Respiratory: Normal respiratory effort.  No accessory muscle use or retractions. Lungs CTAB.  No wheezes, rales or ronchi. Gastrointestinal: Soft, nontender and nondistended.  No guarding or rebound.  No peritoneal signs. Musculoskeletal: No LE edema. No ttp in the calves or palpable cords.  Negative Homan's sign. Neurologic:  A&Ox3.  Speech is clear.  Face and smile are symmetric.  EOMI.  Moves all extremities well. Skin:  Skin is warm, dry and intact. No rash noted. Psychiatric: Mood and affect are normal. Speech and behavior are normal.  Normal judgement. Endocrine: Initial CBG 46, now improved.  ____________________________________________   LABS (all labs ordered are listed, but only abnormal results are displayed)  Labs Reviewed  BASIC METABOLIC PANEL - Abnormal; Notable  for the following components:      Result Value   Chloride 99 (*)    BUN 27 (*)    Creatinine, Ser 1.12 (*)    GFR calc non Af Amer 45 (*)    GFR calc Af Amer 53 (*)    All other components within normal limits  GLUCOSE, CAPILLARY - Abnormal; Notable for the following components:   Glucose-Capillary 46 (*)    All other components within normal limits  CBC  TROPONIN I  GLUCOSE, CAPILLARY  TROPONIN I  CBG MONITORING, ED  CBG MONITORING, ED  CBG MONITORING, ED  CBG MONITORING, ED   ____________________________________________  EKG  ED ECG REPORT I, Rockne Menghini, the attending physician, personally viewed and interpreted this ECG.   Date: 12/02/2017  EKG Time: 1201  Rate: 64  Rhythm: normal sinus rhythm  Axis: normal  Intervals:none  ST&T Change: No STEMI; nonspecific T wave inversions in V3  through V6.  The patient has similar T wave inversions and an EKG that was performed today 11:34.  These EKG changes are new compared to an EKG performed 2/18.   ____________________________________________  RADIOLOGY  Dg Chest 2 View  Result Date: 12/02/2017 CLINICAL DATA:  Chest pain and shortness of breath since this morning. History of hypertension, diabetes, previous MI, nonsmoker. EXAM: CHEST - 2 VIEW COMPARISON:  Chest x-ray of July 20, 2017 FINDINGS: The lungs are adequately inflated. There is no focal infiltrate. There is no pleural effusion or pneumothorax. The heart and pulmonary vascularity are normal. The mediastinum is normal in width. There is calcification in the wall of the aortic arch. The bony thorax exhibits no acute abnormality. IMPRESSION: There is no pneumonia nor other acute cardiopulmonary abnormality. Thoracic aortic atherosclerosis. Electronically Signed   By: David  [place] M.D.   On: 12/02/2017 12:34    ____________________________________________   PROCEDURES  Procedure(s) performed: None  Procedures  Critical Care performed: No ____________________________________________   INITIAL IMPRESSION / ASSESSMENT AND PLAN / ED COURSE  Pertinent labs & imaging results that were available during my care of the patient were reviewed by me and considered in my medical decision making (see chart for details).  77 y.o. female with a history of CAD status post MI presenting with 1 week of exertional chest pain, as well as intermittent episodes of hypoglycemia.  Overall, the patient is hypertensive in the emergency department at 191/70.  Her heart rate is in the high 50s to low 60s on my exam.  She is currently symptom-free.  Her episode of hypoglycemia may be due to her insulin regimen, but I would also be concerned about other causes including angina.  At this time, the patient will receive aspirin but heparinization is not indicated as she is not having an acute  episode of ACS or unstable angina; she has no current chest pain and her troponin is negative.  She does have EKG changes, which are concerning.  The patient will need admission to the hospital for continued evaluation and treatment of her chest pain, as well as her endocrinopathy.  The patient understands the plan and is in agreement.  ____________________________________________  FINAL CLINICAL IMPRESSION(S) / ED DIAGNOSES  Final diagnoses:  Exertional chest pain  Syncope, unspecified syncope type  Hypoglycemia  Nausea  Acute renal insufficiency  EKG, abnormal         NEW MEDICATIONS STARTED DURING THIS VISIT:  New Prescriptions   No medications on file      Rockne Menghini, MD 12/02/17  1538    Rockne Menghini, MD 12/02/17 1546

## 2017-12-02 NOTE — Consult Note (Signed)
[facility] Cardiology  CARDIOLOGY CONSULT NOTE  Patient ID: Vicki [name] MRN: [id number] DOB/AGE: 1941/04/15 77 y.o.  Admit date: 12/02/2017 Referring Physician Nemiah Commander Primary Physician Graciela Husbands Primary Cardiologist [facility] Reason for Consultation chest pain  HPI: 77 year old female referred for evaluation of chest pain.  Shunt underwent recent cardiac catheterization 10/01/2016 which revealed 50% stenosis distal left circumflex and normal ventricular function, with LVEF 55 to 65%.  She presents chief complaint of substernal chest pain, fibrous tightness, treated with nausea, shortness of breath and diaphoresis, without radiation.  ECG revealed normal sinus rhythm with lateral T wave inversions.  Labs for troponin less than 0.03, with follow-up troponin less than 0.03.  Review of systems complete and found to be negative unless listed above     Past Medical History:  Diagnosis Date  . Diabetes mellitus without complication (HCC)   . GERD (gastroesophageal reflux disease)   . Hypertension   . Lesion of sciatic nerve, right side   . MI (myocardial infarction) (HCC)   . Stroke [facility])     Past Surgical History:  Procedure Laterality Date  . ABDOMINAL HYSTERECTOMY    . BREAST BIOPSY Right   . BREAST SURGERY    . LEFT HEART CATH AND CORONARY ANGIOGRAPHY N/A 10/01/2016   Procedure: Left Heart Cath and Coronary Angiography and possible PCI;  Surgeon: Alwyn Pea, MD;  Location: [facility] INVASIVE CV LAB;  Service: Cardiovascular;  Laterality: N/A;    Medications Prior to Admission  Medication Sig Dispense Refill Last Dose  . acetaminophen (TYLENOL) 500 MG tablet Take 1,000-1,500 mg by mouth every 6 (six) hours as needed for mild pain or headache.   prn at prn  . aspirin EC 81 MG EC tablet Take 1 tablet (81 mg total) by mouth daily. 30 tablet 6 09/29/2016 at 0800  . brimonidine-timolol (COMBIGAN) 0.2-0.5 % ophthalmic solution Place 1 drop into both eyes daily.   09/29/2016 at 0800  .  cephALEXin (KEFLEX) 500 MG capsule Take 1 capsule (500 mg total) by mouth 3 (three) times daily. 21 capsule 0   . citalopram (CELEXA) 20 MG tablet Take 20 mg by mouth daily.   09/29/2016 at 0800  . docusate sodium (COLACE) 100 MG capsule Take 1 capsule (100 mg total) by mouth 2 (two) times daily. 10 capsule 0   . donepezil (ARICEPT) 5 MG tablet Take 5 mg by mouth at bedtime.     . fluticasone (FLONASE) 50 MCG/ACT nasal spray Place 2 sprays into both nostrils daily.   09/29/2016 at 0800  . hydrALAZINE (APRESOLINE) 25 MG tablet Take 1 tablet (25 mg total) by mouth every 8 (eight) hours. 90 tablet 0   . hydrochlorothiazide (HYDRODIURIL) 12.5 MG tablet Take 1 tablet (12.5 mg total) by mouth daily. 30 tablet 0   . Insulin Glargine (TOUJEO SOLOSTAR) 300 UNIT/ML SOPN Inject 40 Units into the skin at bedtime. 1 pen 0 09/28/2016 at pm  . insulin lispro (HUMALOG) 100 UNIT/ML injection Inject 0.07 mLs (7 Units total) into the skin 3 (three) times daily before meals. Pt uses per sliding scale. 10 mL 11 09/29/2016 at 0800  . isosorbide mononitrate (IMDUR) 30 MG 24 hr tablet Take 1 tablet (30 mg total) by mouth daily. 30 tablet 6 09/29/2016 at 0800  . labetalol (NORMODYNE) 100 MG tablet Take 100 mg by mouth 2 (two) times daily.   09/29/2016 at 0800  . loratadine (CLARITIN) 10 MG tablet Take 10 mg by mouth daily.   unknown at unknown  . lovastatin (  MEVACOR) 20 MG tablet Take 40 mg by mouth at bedtime.    09/28/2016 at qhs  . magnesium oxide (MAG-OX) 400 MG tablet Take 400 mg by mouth daily.   Not Taking at Unknown time  . Multiple Vitamins-Minerals (CENTRUM ADULTS) TABS Take by mouth.   09/29/2016 at am  . omeprazole (PRILOSEC) 20 MG capsule Take 1 capsule (20 mg total) by mouth daily. 30 capsule 0 09/29/2016 at 0800  . omeprazole (PRILOSEC) 20 MG capsule Take 1 capsule (20 mg total) by mouth daily. 30 capsule 1   . ondansetron (ZOFRAN ODT) 4 MG disintegrating tablet Take 1 tablet (4 mg total) by mouth every 8 (eight)  hours as needed for nausea or vomiting. 20 tablet 0   . ondansetron (ZOFRAN) 4 MG tablet Take 1 tablet (4 mg total) by mouth every 8 (eight) hours as needed for nausea or vomiting. 20 tablet 0   . polyethylene glycol (MIRALAX / GLYCOLAX) packet Take 17 g by mouth daily. (Patient not taking: Reported on 09/25/2015) 14 each 0 Not Taking at Unknown time  . polyethylene glycol (MIRALAX / GLYCOLAX) packet Take 17 g by mouth daily. 14 each 0   . quinapril (ACCUPRIL) 20 MG tablet Take 2 tablets (40 mg total) by mouth daily. 30 tablet 0    Social History   Socioeconomic History  . Marital status: Widowed    Spouse name: Not on file  . Number of children: Not on file  . Years of education: Not on file  . Highest education level: Not on file  Occupational History  . Not on file  Social Needs  . Financial resource strain: Not on file  . Food insecurity:    Worry: Not on file    Inability: Not on file  . Transportation needs:    Medical: Not on file    Non-medical: Not on file  Tobacco Use  . Smoking status: Never Smoker  . Smokeless tobacco: Never Used  Substance and Sexual Activity  . Alcohol use: No  . Drug use: No  . Sexual activity: Not on file  Lifestyle  . Physical activity:    Days per week: Not on file    Minutes per session: Not on file  . Stress: Not on file  Relationships  . Social connections:    Talks on phone: Not on file    Gets together: Not on file    Attends religious service: Not on file    Active member of club or organization: Not on file    Attends meetings of clubs or organizations: Not on file    Relationship status: Not on file  . Intimate partner violence:    Fear of current or ex partner: Not on file    Emotionally abused: Not on file    Physically abused: Not on file    Forced sexual activity: Not on file  Other Topics Concern  . Not on file  Social History Narrative   Lives with her cousin   Independent at baseline    Family History  Problem  Relation Age of Onset  . Heart disease Mother   . Diabetes Mellitus II Father   . Hypertension Sister   . Diabetes Mellitus II Sister   . Breast cancer Sister 63  . Hypertension Brother   . Diabetes Mellitus II Brother       Review of systems complete and found to be negative unless listed above      PHYSICAL EXAM  General:  Well developed, well nourished, in no acute distress HEENT:  Normocephalic and atramatic Neck:  No JVD.  Lungs: Clear bilaterally to auscultation and percussion. Heart: HRRR . Normal S1 and S2 without gallops or murmurs.  Abdomen: Bowel sounds are positive, abdomen soft and non-tender  Msk:  Back normal, normal gait. Normal strength and tone for age. Extremities: No clubbing, cyanosis or edema.   Neuro: Alert and oriented X 3. Psych:  Good affect, responds appropriately  Labs:   Lab Results  Component Value Date   WBC 6.5 12/02/2017   HGB 13.8 12/02/2017   HCT 41.1 12/02/2017   MCV 89.3 12/02/2017   PLT 318 12/02/2017    Recent Labs  Lab 12/02/17 1207  NA 138  K 3.6  CL 99*  CO2 30  BUN 27*  CREATININE 1.12*  CALCIUM 9.5  GLUCOSE 67   Lab Results  Component Value Date   CKTOTAL 562 (H) 06/10/2015   CKMB 2.8 12/30/2011   TROPONINI <0.03 12/02/2017    Lab Results  Component Value Date   CHOL 184 09/30/2016   CHOL 192 04/05/2012   Lab Results  Component Value Date   HDL 70 09/30/2016   HDL 74 (H) 04/05/2012   Lab Results  Component Value Date   LDLCALC 88 09/30/2016   LDLCALC 94 04/05/2012   Lab Results  Component Value Date   TRIG 132 09/30/2016   TRIG 120 04/05/2012   Lab Results  Component Value Date   CHOLHDL 2.6 09/30/2016   No results found for: LDLDIRECT    Radiology: Dg Chest 2 View  Result Date: 12/02/2017 CLINICAL DATA:  Chest pain and shortness of breath since this morning. History of hypertension, diabetes, previous MI, nonsmoker. EXAM: CHEST - 2 VIEW COMPARISON:  Chest x-ray of July 20, 2017  FINDINGS: The lungs are adequately inflated. There is no focal infiltrate. There is no pleural effusion or pneumothorax. The heart and pulmonary vascularity are normal. The mediastinum is normal in width. There is calcification in the wall of the aortic arch. The bony thorax exhibits no acute abnormality. IMPRESSION: There is no pneumonia nor other acute cardiopulmonary abnormality. Thoracic aortic atherosclerosis. Electronically Signed   By: David  [place]and M.D.   On: 12/02/2017 12:34    EKG: Sinus rhythm, lateral T wave abnormalities  ASSESSMENT AND PLAN:   20.  77 year old female with multiple cardiovascular risk factors, with new onset chest pain, with abnormal baseline ECG, and negative troponin.  Patient underwent recent cardiac catheterization which revealed 50% stenosis distal left circumflex.  Recommendations  1.  Continue current therapy 2.  Defer full dose anticoagulation 3.  Proceed with Lexiscan Myoview in a.m.  Signed: Marcina Millard MD,PhD, [facility] 12/02/2017, 5:58 PM

## 2017-12-02 NOTE — Progress Notes (Signed)
Patient alert and oriented, vss, no complaints of pain.  Nitroglycerin paste on right upper thigh.  Patient scheduled for Stress test tomorrow.  Patient has had recent stress test and has no questions.

## 2017-12-02 NOTE — H&P (Signed)
[facility] [place] [facility]   PATIENT NAME: Jaretzy Lhommedieu    MR#:  [id number]  DATE OF BIRTH:  01-Feb-1941  DATE OF ADMISSION:  12/02/2017  PRIMARY CARE PHYSICIAN: Lynnea Ferrier, MD   REQUESTING/REFERRING PHYSICIAN: Dr. Rockne Menghini  CHIEF COMPLAINT:   Chief Complaint  Patient presents with  . Chest Pain  . Shortness of Breath    HISTORY OF PRESENT ILLNESS:  Demita Tobia  is a 77 y.o. female with a known history of hypertension, diabetes mellitus, GERD, CAD status post 50% left circumflex lesion presents to hospital secondary to exertional chest pain going on for a week now. Patient is not a great historian.  She says she has been feeling weak and tired for a while now.  She has been on 40 units of long-acting insulin at bedtime and short-acting insulin with meals.  According to the endocrinology visit 2 weeks ago, patient has trouble following instructions.  She is otherwise independent physically.  Last week she had a syncopal episode and when EMS was called, her sugar was in the 40s.  She responded to glucose and remained home.  It seems like her sugars have been low lately.  Patient also complains of exertional chest tightness which has been associated with nausea, dyspnea and diaphoresis for the last couple of days.  She usually enjoys walking, however due to this chest pain she is unable to walk a lot.  She was admitted for NSTEMI last year and had a cardiac catheterization done that showed nonobstructive coronary artery disease and 50% LCX lesion.  Due to advancement of her symptoms, will admit under observation as there are lateral lead T wave inversions noted.  Also blood pressure systolic is significantly elevated today.  PAST MEDICAL HISTORY:   Past Medical History:  Diagnosis Date  . Diabetes mellitus without complication (HCC)   . GERD (gastroesophageal reflux disease)   . Hypertension   . Lesion of sciatic nerve, right side   . MI  (myocardial infarction) (HCC)   . Stroke [facility])     PAST SURGICAL HISTORY:   Past Surgical History:  Procedure Laterality Date  . ABDOMINAL HYSTERECTOMY    . BREAST BIOPSY Right   . BREAST SURGERY    . LEFT HEART CATH AND CORONARY ANGIOGRAPHY N/A 10/01/2016   Procedure: Left Heart Cath and Coronary Angiography and possible PCI;  Surgeon: Alwyn Pea, MD;  Location: [facility] INVASIVE CV LAB;  Service: Cardiovascular;  Laterality: N/A;    SOCIAL HISTORY:   Social History   Tobacco Use  . Smoking status: Never Smoker  . Smokeless tobacco: Never Used  Substance Use Topics  . Alcohol use: No    FAMILY HISTORY:   Family History  Problem Relation Age of Onset  . Heart disease Mother   . Diabetes Mellitus II Father   . Hypertension Sister   . Diabetes Mellitus II Sister   . Breast cancer Sister 53  . Hypertension Brother   . Diabetes Mellitus II Brother     DRUG ALLERGIES:  No Known Allergies  REVIEW OF SYSTEMS:   Review of Systems  Constitutional: Positive for malaise/fatigue. Negative for chills, fever and weight loss.  HENT: Negative for ear discharge, ear pain, hearing loss and nosebleeds.   Eyes: Negative for blurred vision, double vision and photophobia.  Respiratory: Positive for shortness of breath. Negative for cough, hemoptysis and wheezing.   Cardiovascular: Positive for chest pain and leg swelling. Negative for palpitations  and orthopnea.  Gastrointestinal: Positive for diarrhea and nausea. Negative for abdominal pain, constipation, heartburn, melena and vomiting.  Genitourinary: Negative for dysuria, frequency and urgency.  Musculoskeletal: Negative for back pain, myalgias and neck pain.  Skin: Negative for rash.  Neurological: Negative for dizziness, tingling, sensory change, speech change, focal weakness and headaches.  Endo/Heme/Allergies: Does not bruise/bleed easily.  Psychiatric/Behavioral: Negative for depression.    MEDICATIONS AT HOME:    Prior to Admission medications   Medication Sig Start Date End Date Taking? Authorizing Provider  acetaminophen (TYLENOL) 500 MG tablet Take 1,000-1,500 mg by mouth every 6 (six) hours as needed for mild pain or headache.    [provider]  aspirin EC 81 MG EC tablet Take 1 tablet (81 mg total) by mouth daily. 03/26/16   Katharina Caper, MD  brimonidine-timolol (COMBIGAN) 0.2-0.5 % ophthalmic solution Place 1 drop into both eyes daily.    [provider]  cephALEXin (KEFLEX) 500 MG capsule Take 1 capsule (500 mg total) by mouth 3 (three) times daily. 05/13/17   Irean Hong, MD  citalopram (CELEXA) 20 MG tablet Take 20 mg by mouth daily.    [provider]  docusate sodium (COLACE) 100 MG capsule Take 1 capsule (100 mg total) by mouth 2 (two) times daily. 10/04/16   Auburn Bilberry, MD  donepezil (ARICEPT) 5 MG tablet Take 5 mg by mouth at bedtime.    [provider]  fluticasone (FLONASE) 50 MCG/ACT nasal spray Place 2 sprays into both nostrils daily.    [provider]  hydrALAZINE (APRESOLINE) 25 MG tablet Take 1 tablet (25 mg total) by mouth every 8 (eight) hours. 10/03/16   Auburn Bilberry, MD  hydrochlorothiazide (HYDRODIURIL) 12.5 MG tablet Take 1 tablet (12.5 mg total) by mouth daily. 10/03/16   Auburn Bilberry, MD  Insulin Glargine (TOUJEO SOLOSTAR) 300 UNIT/ML SOPN Inject 40 Units into the skin at bedtime. 03/26/16   Katharina Caper, MD  insulin lispro (HUMALOG) 100 UNIT/ML injection Inject 0.07 mLs (7 Units total) into the skin 3 (three) times daily before meals. Pt uses per sliding scale. 03/26/16   Katharina Caper, MD  isosorbide mononitrate (IMDUR) 30 MG 24 hr tablet Take 1 tablet (30 mg total) by mouth daily. 03/26/16   Katharina Caper, MD  labetalol (NORMODYNE) 100 MG tablet Take 100 mg by mouth 2 (two) times daily.    [provider]  loratadine (CLARITIN) 10 MG tablet Take 10 mg by mouth daily.    [provider]  lovastatin  (MEVACOR) 20 MG tablet Take 40 mg by mouth at bedtime.     [provider]  magnesium oxide (MAG-OX) 400 MG tablet Take 400 mg by mouth daily.    [provider]  Multiple Vitamins-Minerals (CENTRUM ADULTS) TABS Take by mouth.    [provider]  omeprazole (PRILOSEC) 20 MG capsule Take 1 capsule (20 mg total) by mouth daily. 09/28/15   Katha Hamming, MD  omeprazole (PRILOSEC) 20 MG capsule Take 1 capsule (20 mg total) by mouth daily. 07/20/17 08/19/17  Darci Current, MD  ondansetron (ZOFRAN ODT) 4 MG disintegrating tablet Take 1 tablet (4 mg total) by mouth every 8 (eight) hours as needed for nausea or vomiting. 05/13/17   Irean Hong, MD  ondansetron (ZOFRAN) 4 MG tablet Take 1 tablet (4 mg total) by mouth every 8 (eight) hours as needed for nausea or vomiting. 10/03/16   Auburn Bilberry, MD  polyethylene glycol [facility] / Ethelene Hal) packet Take 17  g by mouth daily. Patient not taking: Reported on 09/25/2015 05/03/15   [name], MD  polyethylene glycol [facility] / Ethelene Hal) packet Take 17 g by mouth daily. 10/04/16   Auburn Bilberry, MD  quinapril (ACCUPRIL) 20 MG tablet Take 2 tablets (40 mg total) by mouth daily. 10/01/16   Auburn Bilberry, MD      VITAL SIGNS:  Blood pressure (!) 191/70, pulse 64, temperature 98.7 F (37.1 C), temperature source Oral, resp. rate 13, height  (1.575 m), weight 68 kg (150 lb), SpO2 100 %.  PHYSICAL EXAMINATION:   Physical Exam  GENERAL:  77 y.o.-year-old patient lying in the bed with no acute distress.  EYES: Pupils equal, round, reactive to light and accommodation. No scleral icterus. Extraocular muscles intact.  HEENT: Head atraumatic, normocephalic. Oropharynx and nasopharynx clear.  NECK:  Supple, no jugular venous distention. No thyroid enlargement, no tenderness.  LUNGS: Normal breath sounds bilaterally, no wheezing, rales,rhonchi or crepitation. No use of accessory muscles of respiration.  Decreased  bibasilar breath sounds CARDIOVASCULAR: S1, S2 normal. No  rubs, or gallops.  2/6 systolic murmur present ABDOMEN: Soft, nontender, nondistended. Bowel sounds present. No organomegaly or mass.  EXTREMITIES: No pedal edema, cyanosis, or clubbing.  NEUROLOGIC: Cranial nerves II through XII are intact. Muscle strength 5/5 in all extremities. Sensation intact. Gait not checked.  PSYCHIATRIC: The patient is alert and oriented x 3.  SKIN: No obvious rash, lesion, or ulcer.   LABORATORY PANEL:   CBC Recent Labs  Lab 12/02/17 1207  WBC 6.5  HGB 13.8  HCT 41.1  PLT 318   ------------------------------------------------------------------------------------------------------------------  Chemistries  Recent Labs  Lab 12/02/17 1207  NA 138  K 3.6  CL 99*  CO2 30  GLUCOSE 67  BUN 27*  CREATININE 1.12*  CALCIUM 9.5   ------------------------------------------------------------------------------------------------------------------  Cardiac Enzymes Recent Labs  Lab 12/02/17 1207  TROPONINI <0.03   ------------------------------------------------------------------------------------------------------------------  RADIOLOGY:  Dg Chest 2 View  Result Date: 12/02/2017 CLINICAL DATA:  Chest pain and shortness of breath since this morning. History of hypertension, diabetes, previous MI, nonsmoker. EXAM: CHEST - 2 VIEW COMPARISON:  Chest x-ray of July 20, 2017 FINDINGS: The lungs are adequately inflated. There is no focal infiltrate. There is no pleural effusion or pneumothorax. The heart and pulmonary vascularity are normal. The mediastinum is normal in width. There is calcification in the wall of the aortic arch. The bony thorax exhibits no acute abnormality. IMPRESSION: There is no pneumonia nor other acute cardiopulmonary abnormality. Thoracic aortic atherosclerosis. Electronically Signed   By: David  [place] M.D.   On: 12/02/2017 12:34    EKG:   Orders placed or performed during  the hospital encounter of 12/02/17  . EKG 12-Lead  . EKG 12-Lead  . ED EKG within 10 minutes  . ED EKG within 10 minutes    IMPRESSION AND PLAN:   Zanaiya Calabria  is a 77 y.o. female with a known history of hypertension, diabetes mellitus, GERD, CAD status post 50% left circumflex lesion presents to hospital secondary to exertional chest pain going on for a week now.  1. Chest pain-could be unstable angina. -Could be from her elevated blood pressures 2.  Will admit, to telemetry. -Recycle troponins.  Echocardiogram, cardiology consulted. -Nitropaste.  Continue aspirin -Scheduled for Myoview tomorrow  2.  Malignant hypertension-IV hydralazine as needed and Nitropaste -Continue home medications which includes oral labetalol, hydralazine, hydrochlorothiazide, Imdur and quinapril -Titrate the doses as needed  3.  GERD-continue Protonix  4.  Diabetes mellitus with hypoglycemia-patient has type 1 diabetes mellitus.  She has had trouble controlling her sugars in the past. -Please see her outpatient endocrinology note. -We will decrease her Lantus to 25 units tonight, diabetes coordinator consult -Sliding scale insulin. -Follow-up A1c  5.  DVT prophylaxis-Lovenox    All the records are reviewed and case discussed with ED provider. Management plans discussed with the patient, family and they are in agreement.  CODE STATUS: Full code  TOTAL TIME TAKING CARE OF THIS PATIENT: 50 minutes.    Enid Baas M.D on 12/02/2017 at 4:02 PM  Between 7am to 6pm - Pager - [phone]  After 6pm go to [email or web address] - password [email or web address]  [facility]  Office  [phone]  CC: Primary care physician; Lynnea Ferrier, MD

## 2017-12-02 NOTE — ED Notes (Signed)
Report called to floor RN. Pt to go to room 232A.

## 2017-12-02 NOTE — ED Notes (Signed)
First Nurse Note: Dr. Clide Cliff sent pt over because for having chest pain and SOB when walking. She did have a stress test in March that was good.

## 2017-12-03 ENCOUNTER — Observation Stay: Payer: Medicare Other

## 2017-12-03 LAB — BASIC METABOLIC PANEL
ANION GAP: 7 (ref 5–15)
BUN: 26 mg/dL — ABNORMAL HIGH (ref 6–20)
CALCIUM: 8.7 mg/dL — AB (ref 8.9–10.3)
CO2: 27 mmol/L (ref 22–32)
Chloride: 98 mmol/L — ABNORMAL LOW (ref 101–111)
Creatinine, Ser: 0.97 mg/dL (ref 0.44–1.00)
GFR, EST NON AFRICAN AMERICAN: 54 mL/min — AB (ref >60)
Glucose, Bld: 426 mg/dL — ABNORMAL HIGH (ref 65–99)
POTASSIUM: 3.9 mmol/L (ref 3.5–5.1)
SODIUM: 132 mmol/L — AB (ref 135–145)

## 2017-12-03 LAB — NM MYOCAR MULTI W/SPECT W/WALL MOTION / EF
CHL CUP MPHR: 141 {beats}/min
CHL CUP NUCLEAR SDS: 0
CHL CUP NUCLEAR SRS: 1
CHL CUP NUCLEAR SSS: 2
CHL CUP RESTING HR STRESS: 68 {beats}/min
CSEPEDS: 0 s
CSEPEW: 1 METS
Exercise duration (min): 1 min
LV dias vol: 35 mL (ref 46–106)
LVSYSVOL: 7 mL
NUC STRESS TID: 1.25
Peak HR: 100 {beats}/min
Percent HR: 70 %

## 2017-12-03 LAB — CBC
HCT: 38.2 % (ref 35.0–47.0)
Hemoglobin: 12.9 g/dL (ref 12.0–16.0)
MCH: 30.3 pg (ref 26.0–34.0)
MCHC: 33.7 g/dL (ref 32.0–36.0)
MCV: 89.8 fL (ref 80.0–100.0)
PLATELETS: 275 10*3/uL (ref 150–440)
RBC: 4.26 MIL/uL (ref 3.80–5.20)
RDW: 13.3 % (ref 11.5–14.5)
WBC: 6.1 10*3/uL (ref 3.6–11.0)

## 2017-12-03 LAB — GLUCOSE, CAPILLARY
GLUCOSE-CAPILLARY: 416 mg/dL — AB (ref 65–99)
GLUCOSE-CAPILLARY: 111 mg/dL — AB (ref 65–99)
GLUCOSE-CAPILLARY: 168 mg/dL — AB (ref 65–99)
Glucose-Capillary: 366 mg/dL — ABNORMAL HIGH (ref 65–99)
Glucose-Capillary: 148 mg/dL — ABNORMAL HIGH (ref 65–99)

## 2017-12-03 LAB — ECHOCARDIOGRAM COMPLETE
HEIGHTINCHES: 64 in
Weight: 2406.4 oz

## 2017-12-03 LAB — TROPONIN I: Troponin I: <0.03 ng/mL (ref <0.03)

## 2017-12-03 MED ORDER — SODIUM CHLORIDE 0.9 % IV BOLUS
1000.0000 mL | Freq: Once | INTRAVENOUS | Status: AC
  Administered 2017-12-03: 1000 mL via INTRAVENOUS

## 2017-12-03 MED ORDER — TECHNETIUM TC 99M TETROFOSMIN IV KIT
13.5400 | PACK | Freq: Once | INTRAVENOUS | Status: AC | PRN
  Administered 2017-12-03: 13.54 via INTRAVENOUS

## 2017-12-03 MED ORDER — REGADENOSON 0.4 MG/5ML IV SOLN
0.4000 mg | Freq: Once | INTRAVENOUS | Status: AC
  Administered 2017-12-03: 0.4 mg via INTRAVENOUS
  Filled 2017-12-03: qty 5

## 2017-12-03 MED ORDER — SODIUM CHLORIDE 0.9 % IV BOLUS: 100.0000 mL | Freq: Once | INTRAVENOUS | Status: DC

## 2017-12-03 MED ORDER — DEXTROSE 50 % IV SOLN
INTRAVENOUS | Status: AC
  Filled 2017-12-03: qty 50

## 2017-12-03 MED ORDER — INSULIN GLARGINE 100 UNIT/ML ~~LOC~~ SOLN
35.0000 [IU] | Freq: Every day | SUBCUTANEOUS | Status: DC
  Administered 2017-12-03: 35 [IU] via SUBCUTANEOUS
  Filled 2017-12-03 (×2): qty 0.35

## 2017-12-03 MED ORDER — INSULIN ASPART 100 UNIT/ML ~~LOC~~ SOLN
15.0000 [IU] | Freq: Once | SUBCUTANEOUS | Status: AC
  Administered 2017-12-03: 15 [IU] via SUBCUTANEOUS
  Filled 2017-12-03: qty 1

## 2017-12-03 MED ORDER — PROMETHAZINE HCL 25 MG/ML IJ SOLN
12.5000 mg | Freq: Four times a day (QID) | INTRAMUSCULAR | Status: DC | PRN
  Administered 2017-12-03: 12.5 mg via INTRAVENOUS
  Filled 2017-12-03: qty 1

## 2017-12-03 MED ORDER — DEXTROSE 50 % IV SOLN
1.0000 | Freq: Once | INTRAVENOUS | Status: AC
  Administered 2017-12-03: 50 mL via INTRAVENOUS

## 2017-12-03 MED ORDER — TECHNETIUM TC 99M TETROFOSMIN IV KIT
30.0000 | PACK | Freq: Once | INTRAVENOUS | Status: AC | PRN
  Administered 2017-12-03: 33.01 via INTRAVENOUS

## 2017-12-03 MED ORDER — PROMETHAZINE HCL 25 MG/ML IJ SOLN: 25.0000 mg | Freq: Four times a day (QID) | INTRAMUSCULAR | Status: DC | PRN

## 2017-12-03 NOTE — Care Management Obs Status (Signed)
MEDICARE OBSERVATION STATUS NOTIFICATION   Patient Details  Name: Vicki [name] MRN: [id number] Date of Birth: 09-Apr-1941   Medicare Observation Status Notification Given:       Chapman Fitch, RN 12/03/2017, 3:34 PM

## 2017-12-03 NOTE — Progress Notes (Signed)
PHARMACY COMMUNICATION WITH PROVIDER  Per P&T Committee, maximum IV dose of promethazine (PHENERGAN) in a patient >/= 65 is 12.5mg . Per policy pharmacy has reduced the  dose to 12.5mg  to comply with P&T policy.  Burnis Medin, RPh

## 2017-12-03 NOTE — Plan of Care (Signed)
Up in room without complaints of pain this shift.

## 2017-12-03 NOTE — Progress Notes (Signed)
Patient c.o nausea/diarrhea during and after stress test, PRN zofran given, patient still c/o nausea, MD notified. Orders to give phenergan, will give and continue to assess and monitor.

## 2017-12-03 NOTE — Progress Notes (Signed)
Rapid Response Event Note  Overview:Patient diaphoretic upon entering room and giving phenergan, vitals signs check, BP 68/46, MD paged, rapid response called, 1L NS given.  BG check was 111, order for 25ml of dextrose 50 given.       Initial Focused Assessment, see above note.  Patient alert and oriented x4, pupils reactive.   Interventions: 1L NS, 25ml of dextrose 50 given  Plan of Care (if not transferred): BP stable, will continue to monitor.   Event Summary:  Patient to remain on telemetry floor, VSS, will continue to assess and monitor. 4:32 PM    at          Vicki [name] D Miah Boye

## 2017-12-03 NOTE — Progress Notes (Signed)
[facility] - [place] at [facility]   PATIENT NAME: Vicki [name]    MR#:  [id number]  DATE OF BIRTH:  [birth year]  SUBJECTIVE:  CHIEF COMPLAINT:   Chief Complaint  Patient presents with  . Chest Pain  . Shortness of Breath  saw her initially right after stress test, was feeling sick to stomach, chest pain resolved. Received phenergan as zofran didn't help much and had rapid response called so went back and saw her, hypotensive, lethargic REVIEW OF SYSTEMS:  Review of Systems  Constitutional: Negative for chills, fever and weight loss.  HENT: Negative for nosebleeds and sore throat.   Eyes: Negative for blurred vision.  Respiratory: Negative for cough, shortness of breath and wheezing.   Cardiovascular: Positive for chest pain. Negative for orthopnea, leg swelling and PND.  Gastrointestinal: Negative for abdominal pain, constipation, diarrhea, heartburn, nausea and vomiting.  Genitourinary: Negative for dysuria and urgency.  Musculoskeletal: Negative for back pain.  Skin: Negative for rash.  Neurological: Negative for dizziness, speech change, focal weakness and headaches.  Endo/Heme/Allergies: Does not bruise/bleed easily.  Psychiatric/Behavioral: Negative for depression.    DRUG ALLERGIES:  No Known Allergies VITALS:  Blood pressure (!) 115/55, pulse 66, temperature 98.6 F (37 C), resp. rate 18, height  (1.626 m), weight 68.2 kg (150 lb 6.4 oz), SpO2 97 %. PHYSICAL EXAMINATION:  Physical Exam  Constitutional: She is oriented to person, place, and time.  HENT:  Head: Normocephalic and atraumatic.  Eyes: Pupils are equal, round, and reactive to light. Conjunctivae and EOM are normal.  Neck: Normal range of motion. Neck supple. No tracheal deviation present. No thyromegaly present.  Cardiovascular: Normal rate, regular rhythm and normal heart sounds.  Pulmonary/Chest: Effort normal and breath sounds normal. No respiratory distress. She has no wheezes.  She exhibits no tenderness.  Abdominal: Soft. Bowel sounds are normal. She exhibits no distension. There is no tenderness.  Musculoskeletal: Normal range of motion.  Neurological: She is alert and oriented to person, place, and time. No cranial nerve deficit.  Skin: Skin is warm and dry. No rash noted.   LABORATORY PANEL:  Female CBC Recent Labs  Lab 12/03/17 0417  WBC 6.1  HGB 12.9  HCT 38.2  PLT 275   ------------------------------------------------------------------------------------------------------------------ Chemistries  Recent Labs  Lab 12/03/17 0417  NA 132*  K 3.9  CL 98*  CO2 27  GLUCOSE 426*  BUN 26*  CREATININE 0.97  CALCIUM 8.7*   RADIOLOGY:  Nm Myocar Multi W/spect W/wall Motion / Ef  Result Date: 12/03/2017  Blood pressure demonstrated a normal response to exercise.  The study is normal.  This is a low risk study.  The left ventricular ejection fraction is normal (55-65%).    ASSESSMENT AND PLAN:  Sande Pickert  is a 77 y.o. female with a known history of hypertension, diabetes mellitus, GERD, CAD status post 50% left circumflex lesion admitted to hospital secondary to exertional chest pain going on for a week now.  * Suspected reaction to Phenergan - rapid response called for lethargy, hypotension - within 30 mins of phenergan - fluid bolus, and 1/2 amp of D 50 as sugars were 112, she recovered.  1. Chest pain-likely non-cardiac, neg serial troponins. Normal Echocardiogram, cardiology seen -Nitropaste.  Continue aspirin -normal Myoview tomorrow - will do chest CT in am to r/o PE   2.  Malignant hypertension-IV hydralazine as needed and Nitropaste -Continue home medications which includes oral labetalol, hydralazine, hydrochlorothiazide, Imdur and quinapril -Titrate the  doses as needed  3.  GERD-continue Protonix  4.  Diabetes mellitus with hypoglycemia-patient has type 1 diabetes mellitus.  She has had trouble controlling her sugars in the  past. -Please see her outpatient endocrinology note. -increased her Lantus to 35 units per diabetes coordinator  -Sliding scale insulin. - A1c > 10 suggestive of poor control  5.  DVT prophylaxis-Lovenox       All the records are reviewed and case discussed with Care Management/Social Worker. Management plans discussed with the patient, family and they are in agreement.  CODE STATUS: Full Code  TOTAL TIME (Critical Care) TAKING CARE OF THIS PATIENT: 35 minutes.   More than 50% of the time was spent in counseling/coordination of care: YES  POSSIBLE D/C IN 1 DAYS, DEPENDING ON CLINICAL CONDITION.   Delfino Lovett M.D on 12/03/2017 at 7:40 PM  Between 7am to 6pm - Pager - [phone]  After 6pm go to [email or web address] - password [email or web address] [facility]  [facility] [place] [facility]  Office  [phone]  CC: Primary care physician; Lynnea Ferrier, MD  Note: This dictation was prepared with Dragon dictation along with smaller phrase technology. Any transcriptional errors that result from this process are unintentional.

## 2017-12-03 NOTE — Care Management (Signed)
Patient admitted from home with chest pain.  Patient states that she lives at home with her cousin.  Patient states that at baseline she Is independent, and has no medical equipment.  PCP Graciela Husbands.  Uses [facility] for transportation.  Denies issues obtaining medications.  Please consult RNCM if indicated.

## 2017-12-03 NOTE — Progress Notes (Addendum)
Patients BG 416, MD notified, orders to give 15 units Novolog and place consult to diabetes coordinator. Will give and continue to monitor.

## 2017-12-03 NOTE — Plan of Care (Signed)
Patient c/o nausea/diarrhea throughout shift after stress test, BP dropped, rapid response called, patient VS are now stable. Will continue to assess and monitor.  Problem: Activity: Goal: Risk for activity intolerance will decrease Outcome: Not Progressing   Problem: Nutrition: Goal: Adequate nutrition will be maintained Outcome: Not Progressing   Problem: Safety: Goal: Ability to remain free from injury will improve Outcome: Progressing

## 2017-12-03 NOTE — Care Management Obs Status (Signed)
MEDICARE OBSERVATION STATUS NOTIFICATION   Patient Details  Name: Vicki [name] MRN: [id number] Date of Birth: 01-11-1941   Medicare Observation Status Notification Given:  Yes    Chapman Fitch, RN 12/03/2017, 3:34 PM

## 2017-12-03 NOTE — Progress Notes (Signed)
Inpatient Diabetes Program Recommendations  AACE/ADA: New Consensus Statement on Inpatient Glycemic Control (2015)  Target Ranges:  Prepandial:   less than 140 mg/dL      Peak postprandial:   less than 180 mg/dL (1-2 hours)      Critically ill patients:  140 - 180 mg/dL   Lab Results  Component Value Date   GLUCAP 366 (H) 12/03/2017   HGBA1C 10.4 (H) 12/02/2017    Review of Glycemic Control Results for [name], Vicki [name] (MRN [id number]) as of 12/03/2017 12:45  Ref. Range 12/02/2017 16:08 12/02/2017 16:51 12/02/2017 21:08 12/03/2017 10:43 12/03/2017 12:26  Glucose-Capillary Latest Ref Range: 65 - 99 mg/dL 409 (H) 811 (H) 914 (H) 416 (H) 366 (H)   Diabetes history: DM Outpatient Diabetes medications: Toujeo 40 units qd + Humalog 7 units tid meal coverage + correction scale Current orders for Inpatient glycemic control: Lantus 25 units qd + Novolog sensitive correction scale tid + hs  Inpatient Diabetes Program Recommendations:   Spoke with patient @ bedside. Patient states she takes her Toujeo daily and takes her Humalog most of the time when she eats. States she has not felt like eating much over the past few weeks. Last appointment @ [facility] was yesterday with Dr. Graciela Husbands prior to admission to hospital. Patient shared that her CBG in the morning was sometimes 20-40 and then would elevate to >400. Note from Dr. Tedd Sias states patient is a type 1 so would recommend increase in Lantus to 35 units and add Novolog meal coverage tid when patient is eating. Discussed elevated A1c of 10.4 and reviewed hypoglycemia with patient as well. Patient has glucose meter and strips to check CBGs.  Thank you, [name]. Priscille Shadduck, RN, MSN, CDE  Diabetes Coordinator Inpatient Glycemic Control Team Team Pager [phone] (8am-5pm) 12/03/2017 12:52 PM

## 2017-12-04 ENCOUNTER — Observation Stay: Payer: Medicare Other

## 2017-12-04 ENCOUNTER — Encounter: Payer: Self-pay | Admitting: Radiology

## 2017-12-04 LAB — CBC
HCT: 34.8 % — ABNORMAL LOW (ref 35.0–47.0)
Hemoglobin: 11.7 g/dL — ABNORMAL LOW (ref 12.0–16.0)
MCH: 30.3 pg (ref 26.0–34.0)
MCHC: 33.6 g/dL (ref 32.0–36.0)
MCV: 90.4 fL (ref 80.0–100.0)
Platelets: 296 10*3/uL (ref 150–440)
RBC: 3.86 MIL/uL (ref 3.80–5.20)
RDW: 13.5 % (ref 11.5–14.5)
WBC: 7.2 10*3/uL (ref 3.6–11.0)

## 2017-12-04 LAB — BASIC METABOLIC PANEL
ANION GAP: 8 (ref 5–15)
BUN: 27 mg/dL — ABNORMAL HIGH (ref 6–20)
CALCIUM: 8.2 mg/dL — AB (ref 8.9–10.3)
CO2: 26 mmol/L (ref 22–32)
Chloride: 100 mmol/L — ABNORMAL LOW (ref 101–111)
Creatinine, Ser: 1.19 mg/dL — ABNORMAL HIGH (ref 0.44–1.00)
GFR, EST AFRICAN AMERICAN: 49 mL/min — AB (ref >60)
GFR, EST NON AFRICAN AMERICAN: 42 mL/min — AB (ref >60)
Glucose, Bld: 386 mg/dL — ABNORMAL HIGH (ref 65–99)
Potassium: 3.5 mmol/L (ref 3.5–5.1)
Sodium: 134 mmol/L — ABNORMAL LOW (ref 135–145)

## 2017-12-04 LAB — GLUCOSE, CAPILLARY
GLUCOSE-CAPILLARY: 340 mg/dL — AB (ref 65–99)
Glucose-Capillary: 312 mg/dL — ABNORMAL HIGH (ref 65–99)

## 2017-12-04 MED ORDER — IOPAMIDOL (ISOVUE-370) INJECTION 76%
75.0000 mL | Freq: Once | INTRAVENOUS | Status: AC | PRN
  Administered 2017-12-04: 75 mL via INTRAVENOUS

## 2017-12-04 MED ORDER — INSULIN GLARGINE 100 UNIT/ML ~~LOC~~ SOLN
40.0000 [IU] | Freq: Every day | SUBCUTANEOUS | Status: DC
  Filled 2017-12-04: qty 0.4

## 2017-12-04 MED ORDER — AZITHROMYCIN 500 MG PO TABS: 500.0000 mg | ORAL_TABLET | Freq: Every day | ORAL | 0 refills | Status: AC

## 2017-12-04 MED ORDER — INSULIN ASPART 100 UNIT/ML ~~LOC~~ SOLN
5.0000 [IU] | Freq: Three times a day (TID) | SUBCUTANEOUS | Status: DC
Start: 2017-12-04 — End: 2017-12-04
  Administered 2017-12-04: 5 [IU] via SUBCUTANEOUS
  Filled 2017-12-04: qty 1

## 2017-12-04 NOTE — Plan of Care (Signed)
No chest pain or ectopics this shift.

## 2017-12-04 NOTE — Discharge Instructions (Signed)
Chest Wall Pain °Chest wall pain is pain in or around the bones and muscles of your chest. Sometimes, an injury causes this pain. Sometimes, the cause may not be known. This pain may take several weeks or longer to get better. °Follow these instructions at home: °Pay attention to any changes in your symptoms. Take these actions to help with your pain: °· Rest as told by your doctor. °· Avoid activities that cause pain. Try not to use your chest, belly (abdominal), or side muscles to lift heavy things. °· If directed, apply ice to the painful area: °? Put ice in a plastic bag. °? Place a towel between your skin and the bag. °? Leave the ice on for 20 minutes, 2-3 times per day. °· Take over-the-counter and prescription medicines only as told by your doctor. °· Do not use tobacco products, including cigarettes, chewing tobacco, and e-cigarettes. If you need help quitting, ask your doctor. °· Keep all follow-up visits as told by your doctor. This is important. ° °Contact a doctor if: °· You have a fever. °· Your chest pain gets worse. °· You have new symptoms. °Get help right away if: °· You feel sick to your stomach (nauseous) or you throw up (vomit). °· You feel sweaty or light-headed. °· You have a cough with phlegm (sputum) or you cough up blood. °· You are short of breath. °This information is not intended to replace advice given to you by your health care provider. Make sure you discuss any questions you have with your health care provider. °Document Released: 01/08/2008 Document Revised: 12/28/2015 Document Reviewed: 10/17/2014 °Elsevier Interactive Patient Education © 2018 Elsevier Inc. ° °

## 2017-12-04 NOTE — Progress Notes (Signed)
Inpatient Diabetes Program Recommendations  AACE/ADA: New Consensus Statement on Inpatient Glycemic Control (2015)  Target Ranges:  Prepandial:   less than 140 mg/dL      Peak postprandial:   less than 180 mg/dL (1-2 hours)      Critically ill patients:  140 - 180 mg/dL   Lab Results  Component Value Date   GLUCAP 168 (H) 12/03/2017   HGBA1C 10.4 (H) 12/02/2017    Review of Glycemic Control  Diabetes history: DM Outpatient Diabetes medications: Toujeo 40 units qd + Humalog 7 units tid meal coverage + correction scale Current orders for Inpatient glycemic control: Lantus 35 units qd + Novolog sensitive correction scale tid + hs  Inpatient Diabetes Program Recommendations:   Fasting CBG 312. Spoke with RN Vicki [name].Patient states she is hungry this morning and plans to eat. -Increase Lantus to 40 units qd -Add Novolog 5 units tid meal coverage if eats 50%  Thank you, Vicki Hong E. Julietta Batterman, RN, MSN, CDE  Diabetes Coordinator Inpatient Glycemic Control Team Team Pager [phone] (8am-5pm) 12/04/2017 7:58 AM

## 2017-12-04 NOTE — Progress Notes (Signed)
Patient alert and oriented, vss, no complaints of pain.  To be escorted out of hospital via wheelchair by volunteers.   

## 2017-12-06 NOTE — Discharge Summary (Signed)
[facility] - [place] at [facility]   PATIENT NAME: Vicki [name]    MR#:  [id number]  DATE OF BIRTH:  17-Aug-1940  DATE OF ADMISSION:  12/02/2017   ADMITTING PHYSICIAN: Enid Baas, MD  DATE OF DISCHARGE: 12/04/2017 12:30 PM  PRIMARY CARE PHYSICIAN: Lynnea Ferrier, MD   ADMISSION DIAGNOSIS:  Nausea [R11.0] Hypoglycemia [E16.2] Acute renal insufficiency [N28.9] Exertional chest pain [R07.9] EKG, abnormal [R94.31] Syncope, unspecified syncope type [R55] Hypertension, unspecified type [I10] DISCHARGE DIAGNOSIS:  Active Problems:   Chest pain  SECONDARY DIAGNOSIS:   Past Medical History:  Diagnosis Date  . Diabetes mellitus without complication (HCC)   . GERD (gastroesophageal reflux disease)   . Hypertension   . Lesion of sciatic nerve, right side   . MI (myocardial infarction) (HCC)   . 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 Chest pain-likely non-cardiac, neg serial troponins. NormalEchocardiogram, cardiology seen -Continue aspirin -normal Myoview  - chest CT neg for PE   2. Malignant hypertension-improved -Continue home medications which includes oral labetalol, hydralazine, hydrochlorothiazide, Imdur and quinapril  3. GERD-continue Protonix  4. Diabetes mellitus with hypoglycemia-patient has type 1 diabetes mellitus. She has had trouble controlling her sugars in the past. -recommend outpt endocrine f/up - A1c > 10 suggestive of poor control DISCHARGE CONDITIONS:  stable CONSULTS OBTAINED:   DRUG  ALLERGIES:  No Known Allergies DISCHARGE MEDICATIONS:   Allergies as of 12/04/2017   No Known Allergies     Medication List    STOP taking these medications   cephALEXin 500 MG capsule Commonly known as:  KEFLEX     TAKE these medications   acetaminophen 500 MG tablet Commonly known as:  TYLENOL Take 1,000-1,500 mg by mouth every 6 (six) hours as needed for mild pain or headache.   aspirin 81 MG EC tablet Take 1 tablet (81 mg total) by mouth daily.   azithromycin 500 MG tablet Commonly known as:  ZITHROMAX Take 1 tablet (500 mg total) by mouth daily for 3 days. Take 1 tablet daily for 3 days.   CENTRUM ADULTS Tabs Take by mouth.   citalopram 20 MG tablet Commonly known as:  CELEXA Take 20 mg by mouth daily.   COMBIGAN 0.2-0.5 % ophthalmic solution Generic drug:  brimonidine-timolol Place 1 drop into both eyes daily.   docusate sodium 100 MG capsule Commonly known as:  COLACE Take 1 capsule (100 mg total) by mouth 2 (two) times daily.   donepezil 5 MG tablet Commonly known as:  ARICEPT Take 5 mg by mouth at bedtime.   fluticasone 50 MCG/ACT nasal spray Commonly known as:  FLONASE Place 2 sprays into both nostrils daily.   hydrALAZINE 25 MG tablet Commonly known as:  APRESOLINE Take 1 tablet (25 mg total) by mouth every 8 (eight) hours.   hydrochlorothiazide 12.5 MG tablet Commonly known as:  HYDRODIURIL Take 1 tablet (12.5 mg total) by mouth daily.   Insulin Glargine 300 UNIT/ML Sopn Commonly known as:  TOUJEO SOLOSTAR Inject 40 Units into the skin at bedtime.  insulin lispro 100 UNIT/ML injection Commonly known as:  HUMALOG Inject 0.07 mLs (7 Units total) into the skin 3 (three) times daily before meals. Pt uses per sliding scale.   isosorbide mononitrate 30 MG 24 hr tablet Commonly known as:  IMDUR Take 1 tablet (30 mg total) by mouth daily.   labetalol 100 MG tablet Commonly known as:  NORMODYNE Take 100 mg by mouth 2 (two) times daily.     loratadine 10 MG tablet Commonly known as:  CLARITIN Take 10 mg by mouth daily.   lovastatin 20 MG tablet Commonly known as:  MEVACOR Take 40 mg by mouth at bedtime.   magnesium oxide 400 MG tablet Commonly known as:  MAG-OX Take 400 mg by mouth daily.   omeprazole 20 MG capsule Commonly known as:  PRILOSEC Take 1 capsule (20 mg total) by mouth daily.   omeprazole 20 MG capsule Commonly known as:  PRILOSEC Take 1 capsule (20 mg total) by mouth daily.   ondansetron 4 MG disintegrating tablet Commonly known as:  ZOFRAN ODT Take 1 tablet (4 mg total) by mouth every 8 (eight) hours as needed for nausea or vomiting.   ondansetron 4 MG tablet Commonly known as:  ZOFRAN Take 1 tablet (4 mg total) by mouth every 8 (eight) hours as needed for nausea or vomiting.   polyethylene glycol packet Commonly known as:  MIRALAX / GLYCOLAX Take 17 g by mouth daily.   polyethylene glycol packet Commonly known as:  MIRALAX / GLYCOLAX Take 17 g by mouth daily.   quinapril 20 MG tablet Commonly known as:  ACCUPRIL Take 2 tablets (40 mg total) by mouth daily.        DISCHARGE INSTRUCTIONS:   DIET:  Diabetic diet DISCHARGE CONDITION:  Stable ACTIVITY:  Activity as tolerated OXYGEN:  Home Oxygen: No.  Oxygen Delivery: room air DISCHARGE LOCATION:  home   If you experience worsening of your admission symptoms, develop shortness of breath, life threatening emergency, suicidal or homicidal thoughts you must seek medical attention immediately by calling 911 or calling your MD immediately  if symptoms less severe.  You Must read complete instructions/literature along with all the possible adverse reactions/side effects for all the Medicines you take and that have been prescribed to you. Take any new Medicines after you have completely understood and accpet all the possible adverse reactions/side effects.   Please note  You were cared for by a hospitalist during your hospital stay.  If you have any questions about your discharge medications or the care you received while you were in the hospital after you are discharged, you can call the unit and asked to speak with the hospitalist on call if the hospitalist that took care of you is not available. Once you are discharged, your primary care physician will handle any further medical issues. Please note that NO REFILLS for any discharge medications will be authorized once you are discharged, as it is imperative that you return to your primary care physician (or establish a relationship with a primary care physician if you do not have one) for your aftercare needs so that they can reassess your need for medications and monitor your lab values.    On the day of Discharge:  VITAL SIGNS:  Blood pressure (!) 169/67, pulse (!) 59, temperature 98.4 F (36.9 C), temperature source Oral, resp. rate 16, height  (1.626 m), weight 68.2 kg (150 lb 6.4 oz), SpO2 99 %. PHYSICAL EXAMINATION:  GENERAL:  77 y.o.-year-old patient  lying in the bed with no acute distress.  EYES: Pupils equal, round, reactive to light and accommodation. No scleral icterus. Extraocular muscles intact.  HEENT: Head atraumatic, normocephalic. Oropharynx and nasopharynx clear.  NECK:  Supple, no jugular venous distention. No thyroid enlargement, no tenderness.  LUNGS: Normal breath sounds bilaterally, no wheezing, rales,rhonchi or crepitation. No use of accessory muscles of respiration.  CARDIOVASCULAR: S1, S2 normal. No murmurs, rubs, or gallops.  ABDOMEN: Soft, non-tender, non-distended. Bowel sounds present. No organomegaly or mass.  EXTREMITIES: No pedal edema, cyanosis, or clubbing.  NEUROLOGIC: Cranial nerves II through XII are intact. Muscle strength 5/5 in all extremities. Sensation intact. Gait not checked.  PSYCHIATRIC: The patient is alert and oriented x 3.  SKIN: No obvious rash, lesion, or ulcer.  DATA REVIEW:   CBC Recent Labs  Lab 12/04/17 0456   WBC 7.2  HGB 11.7*  HCT 34.8*  PLT 296    Chemistries  Recent Labs  Lab 12/04/17 0456  NA 134*  K 3.5  CL 100*  CO2 26  GLUCOSE 386*  BUN 27*  CREATININE 1.19*  CALCIUM 8.2*     Follow-up Information    Lynnea Ferrier, MD. Go on 12/10/2017.   Specialty:  Internal Medicine Why:  Appointment Time: 10:30am Contact information: [address] [facility] [place] [address] [phone]        Marcina Millard, MD. Nyra Capes on 12/11/2017.   Specialty:  Cardiology Why:  Appointment Time: 9:45am Contact information: [address] [facility] [place] [address] [phone]            Management plans discussed with the patient, family and they are in agreement.  CODE STATUS: Prior   TOTAL TIME TAKING CARE OF THIS PATIENT: 45 minutes.    Delfino Lovett M.D on 12/06/2017 at 10:42 AM  Between 7am to 6pm - Pager - [phone]  After 6pm go to [email or web address] - password [email or web address] [facility]  [facility] [place] [facility]  Office  [phone]  CC: Primary care physician; Lynnea Ferrier, MD   Note: This dictation was prepared with Dragon dictation along with smaller phrase technology. Any transcriptional errors that result from this process are unintentional.

## 2017-12-10 ENCOUNTER — Encounter: Payer: Self-pay | Admitting: Emergency Medicine

## 2017-12-10 ENCOUNTER — Other Ambulatory Visit: Payer: Self-pay

## 2017-12-10 ENCOUNTER — Observation Stay
Admission: EM | Admit: 2017-12-10 | Discharge: 2017-12-11 | Disposition: A | Discharge status: Home / Self Care | Payer: Medicare Other | Attending: Specialist | Admitting: Specialist

## 2017-12-10 DIAGNOSIS — Z794 Long term (current) use of insulin: Secondary | ICD-10-CM | POA: Diagnosis present

## 2017-12-10 DIAGNOSIS — E876 Hypokalemia: Secondary | ICD-10-CM | POA: Diagnosis not present

## 2017-12-10 DIAGNOSIS — E119 Type 2 diabetes mellitus without complications: Secondary | ICD-10-CM | POA: Diagnosis present

## 2017-12-10 DIAGNOSIS — Z7982 Long term (current) use of aspirin: Secondary | ICD-10-CM | POA: Insufficient documentation

## 2017-12-10 DIAGNOSIS — I10 Essential (primary) hypertension: Secondary | ICD-10-CM | POA: Insufficient documentation

## 2017-12-10 DIAGNOSIS — Z79899 Other long term (current) drug therapy: Secondary | ICD-10-CM | POA: Diagnosis not present

## 2017-12-10 DIAGNOSIS — Z8673 Personal history of transient ischemic attack (TIA), and cerebral infarction without residual deficits: Secondary | ICD-10-CM | POA: Insufficient documentation

## 2017-12-10 DIAGNOSIS — K219 Gastro-esophageal reflux disease without esophagitis: Secondary | ICD-10-CM | POA: Insufficient documentation

## 2017-12-10 DIAGNOSIS — H409 Unspecified glaucoma: Secondary | ICD-10-CM | POA: Diagnosis not present

## 2017-12-10 DIAGNOSIS — G5701 Lesion of sciatic nerve, right lower limb: Secondary | ICD-10-CM | POA: Diagnosis not present

## 2017-12-10 DIAGNOSIS — IMO0001 Reserved for inherently not codable concepts without codable children: Secondary | ICD-10-CM

## 2017-12-10 DIAGNOSIS — R42 Dizziness and giddiness: Secondary | ICD-10-CM | POA: Insufficient documentation

## 2017-12-10 DIAGNOSIS — E162 Hypoglycemia, unspecified: Secondary | ICD-10-CM | POA: Diagnosis present

## 2017-12-10 DIAGNOSIS — F0390 Unspecified dementia without behavioral disturbance: Secondary | ICD-10-CM | POA: Insufficient documentation

## 2017-12-10 DIAGNOSIS — F4325 Adjustment disorder with mixed disturbance of emotions and conduct: Secondary | ICD-10-CM | POA: Diagnosis not present

## 2017-12-10 DIAGNOSIS — E11649 Type 2 diabetes mellitus with hypoglycemia without coma: Secondary | ICD-10-CM | POA: Diagnosis not present

## 2017-12-10 DIAGNOSIS — Z8249 Family history of ischemic heart disease and other diseases of the circulatory system: Secondary | ICD-10-CM | POA: Diagnosis not present

## 2017-12-10 DIAGNOSIS — Z803 Family history of malignant neoplasm of breast: Secondary | ICD-10-CM | POA: Diagnosis not present

## 2017-12-10 DIAGNOSIS — I2510 Atherosclerotic heart disease of native coronary artery without angina pectoris: Secondary | ICD-10-CM | POA: Diagnosis not present

## 2017-12-10 DIAGNOSIS — Z90710 Acquired absence of both cervix and uterus: Secondary | ICD-10-CM | POA: Insufficient documentation

## 2017-12-10 DIAGNOSIS — E785 Hyperlipidemia, unspecified: Secondary | ICD-10-CM | POA: Insufficient documentation

## 2017-12-10 DIAGNOSIS — T383X5A Adverse effect of insulin and oral hypoglycemic [antidiabetic] drugs, initial encounter: Secondary | ICD-10-CM | POA: Insufficient documentation

## 2017-12-10 DIAGNOSIS — H538 Other visual disturbances: Secondary | ICD-10-CM | POA: Diagnosis not present

## 2017-12-10 DIAGNOSIS — I252 Old myocardial infarction: Secondary | ICD-10-CM | POA: Insufficient documentation

## 2017-12-10 DIAGNOSIS — Z833 Family history of diabetes mellitus: Secondary | ICD-10-CM | POA: Insufficient documentation

## 2017-12-10 LAB — GLUCOSE, CAPILLARY
GLUCOSE-CAPILLARY: 114 mg/dL — AB (ref 65–99)
GLUCOSE-CAPILLARY: 41 mg/dL — AB (ref 65–99)
GLUCOSE-CAPILLARY: 265 mg/dL — AB (ref 65–99)
GLUCOSE-CAPILLARY: 388 mg/dL — AB (ref 65–99)
GLUCOSE-CAPILLARY: 342 mg/dL — AB (ref 65–99)
Glucose-Capillary: 256 mg/dL — ABNORMAL HIGH (ref 65–99)

## 2017-12-10 LAB — CBC
HEMATOCRIT: 37.2 % (ref 35.0–47.0)
HEMOGLOBIN: 12.2 g/dL (ref 12.0–16.0)
MCH: 30 pg (ref 26.0–34.0)
MCHC: 32.8 g/dL (ref 32.0–36.0)
MCV: 91.3 fL (ref 80.0–100.0)
Platelets: 357 10*3/uL (ref 150–440)
RBC: 4.07 MIL/uL (ref 3.80–5.20)
RDW: 13.4 % (ref 11.5–14.5)
WBC: 5.9 10*3/uL (ref 3.6–11.0)

## 2017-12-10 LAB — HEMOGLOBIN A1C
Hgb A1c MFr Bld: 10.9 % — ABNORMAL HIGH (ref 4.8–5.6)
MEAN PLASMA GLUCOSE: 266.13 mg/dL

## 2017-12-10 MED ORDER — ALBUTEROL SULFATE (2.5 MG/3ML) 0.083% IN NEBU: 2.5000 mg | INHALATION_SOLUTION | RESPIRATORY_TRACT | Status: DC | PRN

## 2017-12-10 MED ORDER — ENOXAPARIN SODIUM 40 MG/0.4ML ~~LOC~~ SOLN: 40.0000 mg | SUBCUTANEOUS | Status: DC

## 2017-12-10 MED ORDER — BISACODYL 5 MG PO TBEC: 5.0000 mg | DELAYED_RELEASE_TABLET | Freq: Every day | ORAL | Status: DC | PRN

## 2017-12-10 MED ORDER — DEXTROSE 50 % IV SOLN
25.0000 g | Freq: Once | INTRAVENOUS | Status: AC
Start: 2017-12-10 — End: 2017-12-10
  Administered 2017-12-10: 25 g via INTRAVENOUS
  Filled 2017-12-10: qty 50

## 2017-12-10 MED ORDER — ONDANSETRON HCL 4 MG/2ML IJ SOLN: 4.0000 mg | Freq: Four times a day (QID) | INTRAMUSCULAR | Status: DC | PRN

## 2017-12-10 MED ORDER — HYDROCODONE-ACETAMINOPHEN 5-325 MG PO TABS: 1.0000 | ORAL_TABLET | ORAL | Status: DC | PRN

## 2017-12-10 MED ORDER — DEXTROSE 5 % IV SOLN
Freq: Once | INTRAVENOUS | Status: AC
  Administered 2017-12-10: 15:00:00 via INTRAVENOUS

## 2017-12-10 MED ORDER — INSULIN ASPART 100 UNIT/ML ~~LOC~~ SOLN
0.0000 [IU] | Freq: Three times a day (TID) | SUBCUTANEOUS | Status: DC
Start: 2017-12-10 — End: 2017-12-11
  Administered 2017-12-10 – 2017-12-11 (×2): 9 [IU] via SUBCUTANEOUS
  Administered 2017-12-11: 3 [IU] via SUBCUTANEOUS
  Filled 2017-12-10 (×3): qty 1

## 2017-12-10 MED ORDER — ONDANSETRON HCL 4 MG PO TABS: 4.0000 mg | ORAL_TABLET | Freq: Four times a day (QID) | ORAL | Status: DC | PRN

## 2017-12-10 MED ORDER — ENOXAPARIN SODIUM 40 MG/0.4ML ~~LOC~~ SOLN
40.0000 mg | SUBCUTANEOUS | Status: DC
  Administered 2017-12-10: 40 mg via SUBCUTANEOUS
  Filled 2017-12-10: qty 0.4

## 2017-12-10 MED ORDER — INSULIN ASPART 100 UNIT/ML ~~LOC~~ SOLN
0.0000 [IU] | Freq: Every day | SUBCUTANEOUS | Status: DC
  Administered 2017-12-10: 21:00:00 4 [IU] via SUBCUTANEOUS
  Filled 2017-12-10: qty 1

## 2017-12-10 MED ORDER — ACETAMINOPHEN 650 MG RE SUPP: 650.0000 mg | Freq: Four times a day (QID) | RECTAL | Status: DC | PRN

## 2017-12-10 MED ORDER — ACETAMINOPHEN 325 MG PO TABS: 650.0000 mg | ORAL_TABLET | Freq: Four times a day (QID) | ORAL | Status: DC | PRN

## 2017-12-10 MED ORDER — SENNOSIDES-DOCUSATE SODIUM 8.6-50 MG PO TABS: 1.0000 | ORAL_TABLET | Freq: Every evening | ORAL | Status: DC | PRN

## 2017-12-10 NOTE — Care Management Obs Status (Signed)
MEDICARE OBSERVATION STATUS NOTIFICATION   Patient Details  Name: Vicki [name] MRN: [id number] Date of Birth: September 12, 1940   Medicare Observation Status Notification Given:  Yes    Collie Siad, RN 12/10/2017, 2:34 PM

## 2017-12-10 NOTE — ED Notes (Signed)
Pt given meal tray by EDT Crystal

## 2017-12-10 NOTE — ED Notes (Signed)
Pt given snacks by MD Scotty Court

## 2017-12-10 NOTE — Care Management Note (Addendum)
Case Management Note  Patient Details  Name: Vicki [name] MRN: [id number] Date of Birth: 1940/12/26  Subjective/Objective:                  RNCM met with patient to discuss transition of care and to address insulin administration to self in home.  She states Dr. Caryl Comes has told her that she is developing Alzheimer's. She shares with me that she may not be administering her insulin correctly.  She lives with a cousin however this cousin is older than patient and patient states she cannot help.   She is independent with mobility. She pays $3 to [facility] for transportation.  She typically gets meds through mail order however she also uses [facility]. She agrees to home health without preference. She states nurse with [facility] visited with her yesterday. There's a DM RN consult pending- please consider a safer means for patient to administer insulin and we can get [facility] to follow through.    Action/Plan: Referral made to [facility].   Expected Discharge Date:                  Expected Discharge Plan:     In-House Referral:     Discharge planning Services  CM Consult  Post Acute Care Choice:  Home Health Choice offered to:  Patient  DME Arranged:    DME Agency:     HH Arranged:  RN, Nurse's Aide, Social Work [employer] Agency:  [place]  Status of Service:  In process, will continue to follow  If discussed at Long Length of Stay Meetings, dates discussed:    Additional Comments:  Marshell Garfinkel, RN 12/10/2017, 2:55 PM

## 2017-12-10 NOTE — ED Notes (Signed)
Report to Sam RN, Dr. Scotty Court in room.

## 2017-12-10 NOTE — ED Triage Notes (Signed)
Dr. Clide Cliff called to state he was sending patient over from his office with BS of 45 and accidental overdose of insulin this AM.  States family prepares her insulin and he is concerned that patient may need to have Care Management involvement.  Patient alert, skin warm and dry.  To room 10 via WC.

## 2017-12-10 NOTE — Progress Notes (Signed)
Advanced Care Plan.  Purpose of Encounter: CODE STATUS. Parties in Attendance: The patient and me. Patient's Decisional Capacity: Yes. Medical Story: RobertaTateis a77 y.o.femalewith a known history of hypertension, diabetes, MI, stroke and GERD. The patient was brought to ED due to low blood sugar.  She is being admitted for observation due to persistent hypoglycemia.   I discussed with the patient about her condition, prognosis and CODE STATUS.  The patient had DNR and full code in the past.  She wants full code at this time.  Plan:  Code Status: Full code. Time spent discussing advance care planning: 17 minutes.

## 2017-12-10 NOTE — ED Provider Notes (Signed)
[facility] Emergency Department Provider Note  ____________________________________________  Time seen: Approximately 12:05 PM  I have reviewed the triage vital signs and the nursing notes.   HISTORY  Chief Complaint Ingestion    HPI Vicki [name] is a 77 y.o. female with a history of hypertension and diabetes who sent to the ED today for evaluation due to hypoglycemia. The patient has a continuous glucose monitor on her left upper arm. She administers her own humalog insulin at home based on a sliding scale and takes to Toujeo at night.    She reports waking up this morning with dizziness but otherwise feeling okay. She used her electronic monitor to check her blood sugar, but had difficulty reading the number because her vision was slightly blurry. She gave herself some insulin and ate oatmeal for breakfast and then went to her primary care clinic this morning for an appointment. They found her blood sugar to be 45 and sent her to the ED.  Review of her glucose monitor history this morning shows that around 4 AM her blood sugar got as low as 50. At that increased to a peak of 200 before dropping down to 45 around the time she showed up to clinic. Patient cannot say off the top of her head where her sliding scale is, but thinks she gave herself more than the 2-4 units that would be typical of a blood sugar 200 scale.  Patient reports that she manages her own insulin using this continuous cutaneous monitor.  in the ED, her glucose monitor reads 115, correlating with our fingerstick blood glucose.  she now feels better and back to normal. she wants to eat.   Past Medical History:  Diagnosis Date  . Diabetes mellitus without complication (HCC)   . GERD (gastroesophageal reflux disease)   . Hypertension   . Lesion of sciatic nerve, right side   . MI (myocardial infarction) (HCC)   . Stroke [facility])      Patient Active Problem List   Diagnosis Date Noted   . Hypoglycemia 12/10/2017  . 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 NSTEMI (non-ST elevated myocardial infarction) (HCC) 10/01/2016  . 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 Diabetes (HCC) 09/29/2016  . GERD (gastroesophageal reflux disease) 09/29/2016  . Nausea and vomiting 03/24/2016  . Diffuse abdominal pain 03/24/2016  . Hypokalemia 03/24/2016  . Chest pain 03/24/2016  . Elevated troponin I level 09/26/2015  . DKA (diabetic ketoacidoses) (HCC) 09/25/2015     Past Surgical History:  Procedure Laterality Date  . ABDOMINAL HYSTERECTOMY    . BREAST BIOPSY Right   . BREAST SURGERY    . LEFT HEART CATH AND CORONARY ANGIOGRAPHY N/A 10/01/2016   Procedure: Left Heart Cath and Coronary Angiography and possible PCI;  Surgeon: Alwyn Pea, MD;  Location: [facility] INVASIVE CV LAB;  Service: Cardiovascular;  Laterality: N/A;     Prior to Admission medications   Medication Sig Start Date End Date Taking? Authorizing Provider  acetaminophen (TYLENOL) 500 MG tablet Take 1,000-1,500 mg by mouth every 6 (six) hours as needed for mild pain or headache.    [provider]  aspirin EC 81 MG EC tablet Take 1 tablet (81 mg total) by mouth daily. 03/26/16   Katharina Caper, MD  brimonidine-timolol (COMBIGAN) 0.2-0.5 % ophthalmic solution Place 1 drop into both eyes daily.    [provider]  citalopram (CELEXA) 20 MG tablet Take 20  mg by mouth daily.    [provider]  docusate sodium (COLACE) 100 MG capsule Take 1 capsule (100 mg total) by mouth 2 (two) times daily. 10/04/16   Auburn Bilberry, MD  donepezil (ARICEPT) 5 MG tablet Take 5 mg by mouth at bedtime.    [provider]  fluticasone (FLONASE) 50 MCG/ACT nasal spray Place 2 sprays into both nostrils daily.    [provider]  hydrALAZINE (APRESOLINE) 25 MG tablet Take 1 tablet (25 mg total) by mouth every 8 (eight) hours.  10/03/16   Auburn Bilberry, MD  hydrochlorothiazide (HYDRODIURIL) 12.5 MG tablet Take 1 tablet (12.5 mg total) by mouth daily. 10/03/16   Auburn Bilberry, MD  Insulin Glargine (TOUJEO SOLOSTAR) 300 UNIT/ML SOPN Inject 40 Units into the skin at bedtime. 03/26/16   Katharina Caper, MD  insulin lispro (HUMALOG) 100 UNIT/ML injection Inject 0.07 mLs (7 Units total) into the skin 3 (three) times daily before meals. Pt uses per sliding scale. 03/26/16   Katharina Caper, MD  isosorbide mononitrate (IMDUR) 30 MG 24 hr tablet Take 1 tablet (30 mg total) by mouth daily. 03/26/16   Katharina Caper, MD  labetalol (NORMODYNE) 100 MG tablet Take 100 mg by mouth 2 (two) times daily.    [provider]  loratadine (CLARITIN) 10 MG tablet Take 10 mg by mouth daily.    [provider]  lovastatin (MEVACOR) 20 MG tablet Take 40 mg by mouth at bedtime.     [provider]  magnesium oxide (MAG-OX) 400 MG tablet Take 400 mg by mouth daily.    [provider]  Multiple Vitamins-Minerals (CENTRUM ADULTS) TABS Take by mouth.    [provider]  omeprazole (PRILOSEC) 20 MG capsule Take 1 capsule (20 mg total) by mouth daily. 09/28/15   Katha Hamming, MD  omeprazole (PRILOSEC) 20 MG capsule Take 1 capsule (20 mg total) by mouth daily. 07/20/17 08/19/17  Darci Current, MD  ondansetron (ZOFRAN ODT) 4 MG disintegrating tablet Take 1 tablet (4 mg total) by mouth every 8 (eight) hours as needed for nausea or vomiting. 05/13/17   Irean Hong, MD  ondansetron (ZOFRAN) 4 MG tablet Take 1 tablet (4 mg total) by mouth every 8 (eight) hours as needed for nausea or vomiting. 10/03/16   Auburn Bilberry, MD  polyethylene glycol [facility] / Ethelene Hal) packet Take 17 g by mouth daily. Patient not taking: Reported on 09/25/2015 05/03/15   [name], MD  polyethylene glycol [facility] / Ethelene Hal) packet Take 17 g by mouth daily. 10/04/16   Auburn Bilberry, MD  quinapril (ACCUPRIL) 20 MG tablet Take  2 tablets (40 mg total) by mouth daily. 10/01/16   Auburn Bilberry, MD     Allergies Patient has no known allergies.   Family History  Problem Relation Age of Onset  . Heart disease Mother   . Diabetes Mellitus II Father   . Hypertension Sister   . Diabetes Mellitus II Sister   . Breast cancer Sister 34  . Hypertension Brother   . Diabetes Mellitus II Brother     Social History Social History   Tobacco Use  . Smoking status: Never Smoker  . Smokeless tobacco: Never Used  Substance Use Topics  . Alcohol use: No  . Drug use: No    Review of Systems  Constitutional:   No fever or chills.  ENT:   No sore throat. No rhinorrhea. Cardiovascular:   No chest pain or syncope. Respiratory:   No  dyspnea or cough. Gastrointestinal:   Negative for abdominal pain, vomiting and diarrhea.  Musculoskeletal:   Negative for focal pain or swelling All other systems reviewed and are negative except as documented above in ROS and HPI.  ____________________________________________   PHYSICAL EXAM:  VITAL SIGNS: ED Triage Vitals  Enc Vitals Group     BP 12/10/17 1122 128/63     Pulse Rate 12/10/17 1122 68     Resp 12/10/17 1122 16     Temp 12/10/17 1122 97.8 F (36.6 C)     Temp Source 12/10/17 1122 Oral     SpO2 12/10/17 1122 98 %     Weight 12/10/17 1130 152 lb (68.9 kg)     Height 12/10/17 1130  (1.626 m)     Head Circumference --      Peak Flow --      Pain Score 12/10/17 1130 0     Pain Loc --      Pain Edu? --      Excl. in GC? --     Vital signs reviewed, nursing assessments reviewed.   Constitutional:   Alert and oriented. Well appearing and in no distress. Eyes:   Conjunctivae are normal. EOMI. PERRL. ENT      Head:   Normocephalic and atraumatic.      Nose:   No congestion/rhinnorhea.       Mouth/Throat:   MMM, no pharyngeal erythema. No peritonsillar mass.       Neck:   No meningismus. Full ROM. Hematological/Lymphatic/Immunilogical:   No cervical  lymphadenopathy. Cardiovascular:   RRR. Symmetric bilateral radial and DP pulses.  No murmurs.  Respiratory:   Normal respiratory effort without tachypnea/retractions. Breath sounds are clear and equal bilaterally. No wheezes/rales/rhonchi. Gastrointestinal:   Soft and nontender. Non distended. There is no CVA tenderness.  No rebound, rigidity, or guarding. Genitourinary:   deferred Musculoskeletal:   Normal range of motion in all extremities. No joint effusions.  No lower extremity tenderness.  No edema. Neurologic:   Normal speech and language.  Motor grossly intact. No acute focal neurologic deficits are appreciated.  Skin:    Skin is warm, dry and intact. No rash noted.  No petechiae, purpura, or bullae.  ____________________________________________    LABS (pertinent positives/negatives) (all labs ordered are listed, but only abnormal results are displayed) Labs Reviewed  GLUCOSE, CAPILLARY - Abnormal; Notable for the following components:      Result Value   Glucose-Capillary 114 (*)    All other components within normal limits  GLUCOSE, CAPILLARY - Abnormal; Notable for the following components:   Glucose-Capillary 37 (*)    All other components within normal limits  GLUCOSE, CAPILLARY - Abnormal; Notable for the following components:   Glucose-Capillary 41 (*)    All other components within normal limits   ____________________________________________   EKG    ____________________________________________    RADIOLOGY  No results found.  ____________________________________________   PROCEDURES .Critical Care Performed by: Sharman Cheek, MD Authorized by: Sharman Cheek, MD   Critical care provider statement:    Critical care time (minutes):  30   Critical care time was exclusive of:  Separately billable procedures and treating other patients   Critical care was necessary to treat or prevent imminent or life-threatening deterioration of the following  conditions:  Metabolic crisis, endocrine crisis and CNS failure or compromise   Critical care was time spent personally by me on the following activities:  Development of treatment plan with patient or surrogate,  discussions with consultants, evaluation of patient's response to treatment, examination of patient, obtaining history from patient or surrogate, ordering and performing treatments and interventions, ordering and review of laboratory studies, ordering and review of radiographic studies, pulse oximetry, re-evaluation of patient's condition and review of old charts    ____________________________________________    CLINICAL IMPRESSION / ASSESSMENT AND PLAN / ED COURSE  Pertinent labs & imaging results that were available during my care of the patient were reviewed by me and considered in my medical decision making (see chart for details).    patient presents with episode of hypoglycemia related to accidental overdose of her short acting insulin. Now appears to be resolved. We'll monitor her in the ED for a period of time, follow blood sugars, make sure she is eating okay. Review of electronic medical record and primary care note from today does indicate that they think she may be suitable for assisted living. I'll discuss this with the patient and offered to involve social work although she does have family support and would not need to be retained in the emergency department for placement.  Clinical Course as of Dec 10 1409  Wed Dec 10, 2017  1240 Pt still awake, alert, feeling well and at baseline. Assisted pt in calling her sister regarding discussion of ALF referral.   [PS]  1359 Repeat FSBG 41. Suspect unintentional overdose of her long acting Toujeo. Will admit for further management of hypoglycemia. Start D5 infusion to maintain euglycemia.    [PS]    Clinical Course User Index [PS] Sharman Cheek, MD    ----------------------------------------- 2:11 PM on  12/10/2017 -----------------------------------------  Case discussed with hospitalist for further management.  ____________________________________________   FINAL CLINICAL IMPRESSION(S) / ED DIAGNOSES    Final diagnoses:  Hypoglycemia  Insulin dependent diabetes mellitus [facility])     ED Discharge Orders    None      Portions of this note were generated with dragon dictation software. Dictation errors may occur despite best attempts at proofreading.    Sharman Cheek, MD 12/10/17 [phone]

## 2017-12-10 NOTE — ED Notes (Signed)
Pt states she is still having a little blurred vision. Pt denies any pain.

## 2017-12-10 NOTE — Plan of Care (Signed)

## 2017-12-10 NOTE — H&P (Addendum)
[facility] - [place] [facility]   PATIENT NAME: Vicki [name]    MR#:  [id number]  DATE OF BIRTH:  05-05-1941  DATE OF ADMISSION:  12/10/2017  PRIMARY CARE PHYSICIAN: Vicki Ferrier, MD   REQUESTING/REFERRING PHYSICIAN: Dr. Scotty [name]  CHIEF COMPLAINT:   Chief Complaint  Patient presents with  . Ingestion   Low blood sugar today. HISTORY OF PRESENT ILLNESS:  Vicki [name]  is a 77 y.o. female with a known history of hypertension, diabetes, MI, stroke and GERD.  The patient was brought to ED due to persistent low blood sugar.  The patient administer her own Humalog insulin at home based on sliding scale.  She said that she injected Lantus 40 unit last night.  She checked her sugar, which she thought it was high, so she inject Humalog and ate oatmeal this morning.  She went to see her PCP in clinic and was found low blood sugar at 50.  So she was sent to ED for further evaluation.  Her blood sugar is 114, 37 and 41. in the ED.  She was given D50 and start D5 IV.  She complains of dizziness and blurred vision.  But denies any other symptoms. PAST MEDICAL HISTORY:   Past Medical History:  Diagnosis Date  . Diabetes mellitus without complication (HCC)   . GERD (gastroesophageal reflux disease)   . Hypertension   . Lesion of sciatic nerve, right side   . MI (myocardial infarction) (HCC)   . Stroke [facility])     PAST SURGICAL HISTORY:   Past Surgical History:  Procedure Laterality Date  . ABDOMINAL HYSTERECTOMY    . BREAST BIOPSY Right   . BREAST SURGERY    . LEFT HEART CATH AND CORONARY ANGIOGRAPHY N/A 10/01/2016   Procedure: Left Heart Cath and Coronary Angiography and possible PCI;  Surgeon: Alwyn Pea, MD;  Location: [facility] INVASIVE CV LAB;  Service: Cardiovascular;  Laterality: N/A;    SOCIAL HISTORY:   Social History   Tobacco Use  . Smoking status: Never Smoker  . Smokeless tobacco: Never Used  Substance Use Topics  . Alcohol use: No     FAMILY HISTORY:   Family History  Problem Relation Age of Onset  . Heart disease Mother   . Diabetes Mellitus II Father   . Hypertension Sister   . Diabetes Mellitus II Sister   . Breast cancer Sister 29  . Hypertension Brother   . Diabetes Mellitus II Brother     DRUG ALLERGIES:  No Known Allergies  REVIEW OF SYSTEMS:   Review of Systems  Constitutional: Negative for chills, fever and malaise/fatigue.  HENT: Negative for sore throat.   Eyes: Positive for blurred vision. Negative for double vision.  Respiratory: Negative for cough, hemoptysis, shortness of breath, wheezing and stridor.   Cardiovascular: Negative for chest pain, palpitations, orthopnea and leg swelling.  Gastrointestinal: Negative for abdominal pain, blood in stool, diarrhea, melena, nausea and vomiting.  Genitourinary: Negative for dysuria, flank pain and hematuria.  Musculoskeletal: Negative for back pain and joint pain.  Neurological: Positive for dizziness. Negative for sensory change, focal weakness, seizures, loss of consciousness, weakness and headaches.  Endo/Heme/Allergies: Negative for polydipsia.  Psychiatric/Behavioral: Negative for depression. The patient is not nervous/anxious.     MEDICATIONS AT HOME:   Prior to Admission medications   Medication Sig Start Date End Date Taking? Authorizing Provider  acetaminophen (TYLENOL) 500 MG tablet Take 1,000-1,500 mg by mouth every 6 (six) hours  as needed for mild pain or headache.    [provider]  aspirin EC 81 MG EC tablet Take 1 tablet (81 mg total) by mouth daily. 03/26/16   Katharina Caper, MD  brimonidine-timolol (COMBIGAN) 0.2-0.5 % ophthalmic solution Place 1 drop into both eyes daily.    [provider]  citalopram (CELEXA) 20 MG tablet Take 20 mg by mouth daily.    [provider]  docusate sodium (COLACE) 100 MG capsule Take 1 capsule (100 mg total) by mouth 2 (two) times daily. 10/04/16   Auburn Bilberry, MD   donepezil (ARICEPT) 5 MG tablet Take 5 mg by mouth at bedtime.    [provider]  fluticasone (FLONASE) 50 MCG/ACT nasal spray Place 2 sprays into both nostrils daily.    [provider]  hydrALAZINE (APRESOLINE) 25 MG tablet Take 1 tablet (25 mg total) by mouth every 8 (eight) hours. 10/03/16   Auburn Bilberry, MD  hydrochlorothiazide (HYDRODIURIL) 12.5 MG tablet Take 1 tablet (12.5 mg total) by mouth daily. 10/03/16   Auburn Bilberry, MD  Insulin Glargine (TOUJEO SOLOSTAR) 300 UNIT/ML SOPN Inject 40 Units into the skin at bedtime. 03/26/16   Katharina Caper, MD  insulin lispro (HUMALOG) 100 UNIT/ML injection Inject 0.07 mLs (7 Units total) into the skin 3 (three) times daily before meals. Pt uses per sliding scale. 03/26/16   Katharina Caper, MD  isosorbide mononitrate (IMDUR) 30 MG 24 hr tablet Take 1 tablet (30 mg total) by mouth daily. 03/26/16   Katharina Caper, MD  labetalol (NORMODYNE) 100 MG tablet Take 100 mg by mouth 2 (two) times daily.    [provider]  loratadine (CLARITIN) 10 MG tablet Take 10 mg by mouth daily.    [provider]  lovastatin (MEVACOR) 20 MG tablet Take 40 mg by mouth at bedtime.     [provider]  magnesium oxide (MAG-OX) 400 MG tablet Take 400 mg by mouth daily.    [provider]  Multiple Vitamins-Minerals (CENTRUM ADULTS) TABS Take by mouth.    [provider]  omeprazole (PRILOSEC) 20 MG capsule Take 1 capsule (20 mg total) by mouth daily. 09/28/15   Katha Hamming, MD  omeprazole (PRILOSEC) 20 MG capsule Take 1 capsule (20 mg total) by mouth daily. 07/20/17 08/19/17  Darci Current, MD  ondansetron (ZOFRAN ODT) 4 MG disintegrating tablet Take 1 tablet (4 mg total) by mouth every 8 (eight) hours as needed for nausea or vomiting. 05/13/17   Irean Hong, MD  ondansetron (ZOFRAN) 4 MG tablet Take 1 tablet (4 mg total) by mouth every 8 (eight) hours as needed for nausea or vomiting. 10/03/16   Auburn Bilberry, MD  polyethylene glycol [facility] / Ethelene Hal) packet Take 17 g by mouth daily. Patient not taking: Reported on 09/25/2015 05/03/15   [name], MD  polyethylene glycol [facility] / Ethelene Hal) packet Take 17 g by mouth daily. 10/04/16   Auburn Bilberry, MD  quinapril (ACCUPRIL) 20 MG tablet Take 2 tablets (40 mg total) by mouth daily. 10/01/16   Auburn Bilberry, MD      VITAL SIGNS:  Blood pressure 124/63, pulse (!) 55, temperature 97.8 F (36.6 C), temperature source Oral, resp. rate 13, height  (1.626 m), weight 152 lb (68.9 kg), SpO2 100 %.  PHYSICAL EXAMINATION:  Physical Exam  GENERAL:  77 y.o.-year-old patient lying in the bed with no acute distress.  EYES: Pupils equal, round, reactive to light and accommodation. No scleral icterus. Extraocular  muscles intact.  HEENT: Head atraumatic, normocephalic. Oropharynx and nasopharynx clear.  NECK:  Supple, no jugular venous distention. No thyroid enlargement, no tenderness.  LUNGS: Normal breath sounds bilaterally, no wheezing, rales,rhonchi or crepitation. No use of accessory muscles of respiration.  CARDIOVASCULAR: S1, S2 normal. No murmurs, rubs, or gallops.  ABDOMEN: Soft, nontender, nondistended. Bowel sounds present. No organomegaly or mass.  EXTREMITIES: No pedal edema, cyanosis, or clubbing.  NEUROLOGIC: Cranial nerves II through XII are intact. Muscle strength 5/5 in all extremities. Sensation intact. Gait not checked.  PSYCHIATRIC: The patient is alert and oriented x 3.  SKIN: No obvious rash, lesion, or ulcer.   LABORATORY PANEL:   CBC Recent Labs  Lab 12/04/17 0456  WBC 7.2  HGB 11.7*  HCT 34.8*  PLT 296   ------------------------------------------------------------------------------------------------------------------  Chemistries  Recent Labs  Lab 12/04/17 0456  NA 134*  K 3.5  CL 100*  CO2 26  GLUCOSE 386*  BUN 27*  CREATININE 1.19*  CALCIUM 8.2*    ------------------------------------------------------------------------------------------------------------------  Cardiac Enzymes No results for input(s): TROPONINI in the last 168 hours. ------------------------------------------------------------------------------------------------------------------  RADIOLOGY:  No results found.    IMPRESSION AND PLAN:   Hypoglycemia The patient will be placed for observation. Continue D5 IV, encourage oral intake, hold Toujeo 40 units HS and start sliding scale.  Diabetes coordinator consult.  Hypertension.  Controlled.  Continue home hypertension medication. CAD.  Continue aspirin and statin. Check CBC and BMP.  All the records are reviewed and case discussed with ED provider. Management plans discussed with the patient, family and they are in agreement.  CODE STATUS: Full code  TOTAL TIME TAKING CARE OF THIS PATIENT: 42 minutes.    Shaune Pollack M.D on 12/10/2017 at 2:22 PM  Between 7am to 6pm - Pager - [phone]  After 6pm go to [email or web address] - password [email or web address] [facility]  [facility]  Office  [phone]  CC: Primary care physician; Vicki Ferrier, MD   Note: This dictation was prepared with Dragon dictation along with smaller phrase technology. Any transcriptional errors that result from this process are unin

## 2017-12-11 LAB — BASIC METABOLIC PANEL
ANION GAP: 6 (ref 5–15)
BUN: 18 mg/dL (ref 6–20)
CHLORIDE: 102 mmol/L (ref 101–111)
CO2: 28 mmol/L (ref 22–32)
CREATININE: 1.02 mg/dL — AB (ref 0.44–1.00)
Calcium: 8.9 mg/dL (ref 8.9–10.3)
GFR calc non Af Amer: 51 mL/min — ABNORMAL LOW (ref >60)
GFR, EST AFRICAN AMERICAN: 59 mL/min — AB (ref >60)
Glucose, Bld: 86 mg/dL (ref 65–99)
Potassium: 3.8 mmol/L (ref 3.5–5.1)
SODIUM: 136 mmol/L (ref 135–145)

## 2017-12-11 LAB — GLUCOSE, CAPILLARY
GLUCOSE-CAPILLARY: 37 mg/dL — AB (ref 65–99)
GLUCOSE-CAPILLARY: 245 mg/dL — AB (ref 65–99)
GLUCOSE-CAPILLARY: 373 mg/dL — AB (ref 65–99)
Glucose-Capillary: 215 mg/dL — ABNORMAL HIGH (ref 65–99)

## 2017-12-11 MED ORDER — HYDROCHLOROTHIAZIDE 12.5 MG PO CAPS
12.5000 mg | ORAL_CAPSULE | Freq: Every day | ORAL | Status: DC
  Administered 2017-12-11: 16:00:00 12.5 mg via ORAL
  Filled 2017-12-11: qty 1

## 2017-12-11 MED ORDER — PANTOPRAZOLE SODIUM 40 MG PO TBEC: 40.0000 mg | DELAYED_RELEASE_TABLET | Freq: Every day | ORAL | Status: DC

## 2017-12-11 MED ORDER — LABETALOL HCL 100 MG PO TABS
100.0000 mg | ORAL_TABLET | Freq: Two times a day (BID) | ORAL | Status: DC
  Administered 2017-12-11: 16:00:00 100 mg via ORAL
  Filled 2017-12-11 (×2): qty 1

## 2017-12-11 MED ORDER — INSULIN ASPART 100 UNIT/ML ~~LOC~~ SOLN
4.0000 [IU] | Freq: Three times a day (TID) | SUBCUTANEOUS | Status: DC
  Administered 2017-12-11: 13:00:00 4 [IU] via SUBCUTANEOUS
  Filled 2017-12-11: qty 1

## 2017-12-11 MED ORDER — BRIMONIDINE TARTRATE 0.2 % OP SOLN
1.0000 [drp] | Freq: Every day | OPHTHALMIC | Status: DC
  Administered 2017-12-11: 16:00:00 1 [drp] via OPHTHALMIC
  Filled 2017-12-11: qty 5

## 2017-12-11 MED ORDER — MAGNESIUM OXIDE 400 (241.3 MG) MG PO TABS
400.0000 mg | ORAL_TABLET | Freq: Every day | ORAL | Status: DC
  Administered 2017-12-11: 16:00:00 400 mg via ORAL
  Filled 2017-12-11: qty 1

## 2017-12-11 MED ORDER — PRAVASTATIN SODIUM 20 MG PO TABS: 40.0000 mg | ORAL_TABLET | Freq: Every day | ORAL | Status: DC

## 2017-12-11 MED ORDER — CITALOPRAM HYDROBROMIDE 20 MG PO TABS
20.0000 mg | ORAL_TABLET | Freq: Every day | ORAL | Status: DC
Start: 2017-12-11 — End: 2017-12-11
  Administered 2017-12-11: 16:00:00 20 mg via ORAL
  Filled 2017-12-11: qty 1

## 2017-12-11 MED ORDER — FLUTICASONE PROPIONATE 50 MCG/ACT NA SUSP
2.0000 | Freq: Every day | NASAL | Status: DC
  Administered 2017-12-11: 16:00:00 2 via NASAL
  Filled 2017-12-11: qty 16

## 2017-12-11 MED ORDER — LORATADINE 10 MG PO TABS
10.0000 mg | ORAL_TABLET | Freq: Every day | ORAL | Status: DC
  Administered 2017-12-11: 10 mg via ORAL
  Filled 2017-12-11: qty 1

## 2017-12-11 MED ORDER — ADULT MULTIVITAMIN W/MINERALS CH
1.0000 | ORAL_TABLET | Freq: Every day | ORAL | Status: DC
  Administered 2017-12-11: 1 via ORAL
  Filled 2017-12-11: qty 1

## 2017-12-11 MED ORDER — TIMOLOL MALEATE 0.5 % OP SOLN
1.0000 [drp] | Freq: Every day | OPHTHALMIC | Status: DC
  Administered 2017-12-11: 1 [drp] via OPHTHALMIC
  Filled 2017-12-11: qty 5

## 2017-12-11 MED ORDER — DOCUSATE SODIUM 100 MG PO CAPS: 100.0000 mg | ORAL_CAPSULE | Freq: Two times a day (BID) | ORAL | Status: DC

## 2017-12-11 MED ORDER — INSULIN LISPRO 100 UNIT/ML ~~LOC~~ SOLN: 4.0000 [IU] | Freq: Three times a day (TID) | SUBCUTANEOUS | 11 refills | Status: DC

## 2017-12-11 MED ORDER — LISINOPRIL 20 MG PO TABS
40.0000 mg | ORAL_TABLET | Freq: Every day | ORAL | Status: DC
  Administered 2017-12-11: 16:00:00 40 mg via ORAL
  Filled 2017-12-11: qty 2

## 2017-12-11 MED ORDER — DONEPEZIL HCL 5 MG PO TABS
5.0000 mg | ORAL_TABLET | Freq: Every day | ORAL | Status: DC
  Filled 2017-12-11: qty 1

## 2017-12-11 MED ORDER — INSULIN GLARGINE 300 UNIT/ML ~~LOC~~ SOPN: 30.0000 [IU] | PEN_INJECTOR | Freq: Every day | SUBCUTANEOUS | 0 refills | Status: DC

## 2017-12-11 MED ORDER — ISOSORBIDE MONONITRATE ER 30 MG PO TB24
30.0000 mg | ORAL_TABLET | Freq: Every day | ORAL | Status: DC
  Administered 2017-12-11: 30 mg via ORAL
  Filled 2017-12-11: qty 1

## 2017-12-11 MED ORDER — ASPIRIN EC 81 MG PO TBEC
81.0000 mg | DELAYED_RELEASE_TABLET | Freq: Every day | ORAL | Status: DC
  Administered 2017-12-11: 81 mg via ORAL
  Filled 2017-12-11: qty 1

## 2017-12-11 MED ORDER — INSULIN GLARGINE 100 UNIT/ML ~~LOC~~ SOLN
30.0000 [IU] | Freq: Every day | SUBCUTANEOUS | Status: DC
  Administered 2017-12-11: 30 [IU] via SUBCUTANEOUS
  Filled 2017-12-11 (×2): qty 0.3

## 2017-12-11 MED ORDER — HYDRALAZINE HCL 50 MG PO TABS
25.0000 mg | ORAL_TABLET | Freq: Three times a day (TID) | ORAL | Status: DC
  Administered 2017-12-11: 25 mg via ORAL
  Filled 2017-12-11: qty 1

## 2017-12-11 MED ORDER — POLYETHYLENE GLYCOL 3350 17 G PO PACK: 17.0000 g | PACK | Freq: Every day | ORAL | Status: DC | PRN

## 2017-12-11 NOTE — Progress Notes (Addendum)
Inpatient Diabetes Program Recommendations  AACE/ADA: New Consensus Statement on Inpatient Glycemic Control (2015)  Target Ranges:  Prepandial:   less than 140 mg/dL      Peak postprandial:   less than 180 mg/dL (1-2 hours)      Critically ill patients:  140 - 180 mg/dL   Lab Results  Component Value Date   GLUCAP 245 (H) 12/11/2017   HGBA1C 10.9 (H) 12/10/2017    Review of Glycemic Control Results for Vicki [name], Vicki [name] (MRN [id number]) as of 12/11/2017 08:50  Ref. Range 12/10/2017 14:45 12/10/2017 15:21 12/10/2017 17:44 12/10/2017 20:54 12/11/2017 07:45  Glucose-Capillary Latest Ref Range: 65 - 99 mg/dL 147 (H) 829 (H) 562 (H) 342 (H) 245 (H)   Diabetes history: DM1 Outpatient Diabetes medications: Toujeo 40 units qd + Humalog 7 units tid meal coverage tid Current orders for Inpatient glycemic control: Novolog sensitive correction + hs 0-5 units  Inpatient Diabetes Program Recommendations:   Spoke with Dr. Cherlynn Kaiser and recommended starting Lantus 30-35 units daily (to be given as soon as possible due to patient is type 1) and Novolog 4 units meal coverage tid if eats 50%. DM coordinator spoke with patient last admission concerning elevated A1c and insulin regimen. Patient is on best regimen of insulin per Dr. Tedd Sias and would recommend consult to Dr. Tedd Sias for further recommendations if patient does not have support to assist her @ home with her insulin administration. Addendum 12:00 Spoke with patient @ bedside. Patient feels that she is able to give herself insulin herself if she has a sheet of medications listed out for her and her sister in law helps her when she is discharged home. Patient states she thinks she took too much insulin. Requested patient to followup with Dr. Tedd Sias as soon as possible after discharge and requested sister in law to attend visit with patient to help with medication understanding and regimen. Patient agrees to have her sister in law more involved in her care. Spoke with RN  Fabian November concerning plans for patient and lantus ordered as morning dose.  Thank you, [name]. Jerrin Recore, RN, MSN, CDE  Diabetes Coordinator Inpatient Glycemic Control Team Team Pager [phone] (8am-5pm) 12/11/2017 8:54 AM

## 2017-12-11 NOTE — Progress Notes (Signed)
[facility] - [place] at [facility]   PATIENT NAME: Vicki [name]    MR#:  [id number]  DATE OF BIRTH:  1940/12/08  SUBJECTIVE:   Pt. Here due to Hypoglycemia.  Has had recurrent admissions due to similar reasons. No complaints presently.   REVIEW OF SYSTEMS:    Review of Systems  Constitutional: Negative for chills and fever.  HENT: Negative for congestion and tinnitus.   Eyes: Negative for blurred vision and double vision.  Respiratory: Negative for cough, shortness of breath and wheezing.   Cardiovascular: Negative for chest pain, orthopnea and PND.  Gastrointestinal: Negative for abdominal pain, diarrhea, nausea and vomiting.  Genitourinary: Negative for dysuria and hematuria.  Neurological: Negative for dizziness, sensory change and focal weakness.  All other systems reviewed and are negative.   Nutrition: Carb Control Tolerating Diet: yes Tolerating PT: Ambulatory  DRUG ALLERGIES:  No Known Allergies  VITALS:  Blood pressure (!) 182/97, pulse 68, temperature 98.6 F (37 C), resp. rate 18, height  (1.6 m), weight 70.8 kg (156 lb), SpO2 100 %.  PHYSICAL EXAMINATION:   Physical Exam  GENERAL:  77 y.o.-year-old patient lying in bed in no acute distress.  EYES: Pupils equal, round, reactive to light and accommodation.  No scleral icterus.  Extraocular muscles intact.  HEENT: Head atraumatic, normocephalic. Oropharynx and nasopharynx clear.  NECK:  Supple, no jugular venous distention. No thyroid enlargement, no tenderness.  LUNGS: Normal breath sounds bilaterally, no wheezing, rales, rhonchi. No use of accessory muscles of respiration.  CARDIOVASCULAR: S1, S2 normal. No murmurs, rubs, or gallops.  ABDOMEN: Soft, nontender, nondistended. Bowel sounds present. No organomegaly or mass.  EXTREMITIES: No cyanosis, clubbing or edema b/l.    NEUROLOGIC: Cranial nerves II through XII are intact. No focal Motor or sensory deficits b/l.   PSYCHIATRIC: The  patient is alert and oriented x 3.  SKIN: No obvious rash, lesion, or ulcer.    LABORATORY PANEL:   CBC Recent Labs  Lab 12/10/17 1527  WBC 5.9  HGB 12.2  HCT 37.2  PLT 357   ------------------------------------------------------------------------------------------------------------------  Chemistries  Recent Labs  Lab 12/10/17 1146  NA 136  K 3.8  CL 102  CO2 28  GLUCOSE 86  BUN 18  CREATININE 1.02*  CALCIUM 8.9   ------------------------------------------------------------------------------------------------------------------  Cardiac Enzymes No results for input(s): TROPONINI in the last 168 hours. ------------------------------------------------------------------------------------------------------------------  RADIOLOGY:  No results found.   ASSESSMENT AND PLAN:   77 year old female with past medical history of diabetes type 2, hypertension, previous history of MI, stroke, GERD who presents to the hospital due to hypoglycemia.  1.  Hypoglycemia- secondary to her Insulin regimen.   - seen by DM Coordinator and patient's diabetic regimen has been adjusted. - Patient is on Toujeo at home and now on 30 to 35 units of Lantus with 4 units of NovoLog with meals.  Blood sugars are presently stable and she has had no further hypoglycemia. - Plan to have patient follow-up with her endocrinologist Dr. Tedd Sias shortly after discharge.  2.  Essential hypertension-continue hydralazine, hydrochlorothiazide, lisinopril, labetalol, Imdur.  3.  Glaucoma-continue brimonidine, timolol eyedrops.  4.  Hyperlipidemia-continue Pravachol.  5.  GERD-continue Protonix.  6. Dementia - cont. Aricept.   All the records are reviewed and case discussed with Care Management/Social Worker. Management plans discussed with the patient, family and they are in agreement.  CODE STATUS: Full code  DVT Prophylaxis: Lovenox  TOTAL TIME TAKING CARE OF THIS PATIENT: 30 minutes.  POSSIBLE  D/C IN 1-2 DAYS, DEPENDING ON CLINICAL CONDITION.   Houston Siren M.D on 12/11/2017 at 2:27 PM  Between 7am to 6pm - Pager - [phone]  After 6pm go to [email or web address] - Social research officer, government  [facility] [place] [facility]  Office  [phone]  CC: Primary care physician; Lynnea Ferrier, MD

## 2017-12-11 NOTE — Progress Notes (Signed)
Discharge instructions given and went over with patient at bedside. All questions answered. Patient verbalized understanding of changes to insulin regimen. Follow-up appointments reviewed. Patient discharged home with family. Bo Mcclintock, RN

## 2017-12-12 NOTE — Discharge Summary (Signed)
[facility] - [place] at [facility]   PATIENT NAME: Vicki [name]    MR#:  [id number]  DATE OF BIRTH:  [birth year]  DATE OF ADMISSION:  12/10/2017 ADMITTING PHYSICIAN: Shaune Pollack, MD  DATE OF DISCHARGE: 12/11/2017  4:41 PM  PRIMARY CARE PHYSICIAN: Curtis Sites III, MD    ADMISSION DIAGNOSIS:  Hypoglycemia [E16.2] Insulin dependent diabetes mellitus (HCC) [E11.9, Z79.4]  DISCHARGE DIAGNOSIS:  Active Problems:   Hypoglycemia   SECONDARY DIAGNOSIS:   Past Medical History:  Diagnosis Date  . Diabetes mellitus without complication (HCC)   . GERD (gastroesophageal reflux disease)   . Hypertension   . Lesion of sciatic nerve, right side   . MI (myocardial infarction) (HCC)   . Stroke [facility])     HOSPITAL COURSE:   77 year old female with past medical history of diabetes type 2, hypertension, previous history of MI, stroke, GERD who presents to the hospital due to hypoglycemia.  1.  Hypoglycemia- secondary to her Insulin regimen.   - seen by DM Coordinator and patient's diabetic regimen has been adjusted. - Patient is on Toujeo at home and her dosage was adjusted to 30 units at bedtime with only 4 units of NovoLog with meals.  Her hypoglycemia has now resolved after dextrose infusion.  She will follow-up with her endocrinologist next week to have further adjustments to her diabetic regimen.  She is clinically stable to be discharged and she is in agreement with this plan.  2.  Essential hypertension-she will continue hydralazine, hydrochlorothiazide, lisinopril, labetalol, Imdur.  3.  Glaucoma- she will continue brimonidine, timolol eyedrops.  4.  Hyperlipidemia- she will continue Pravachol.  5.  GERD- she will continue Protonix.  6. Dementia - she will cont. Aricept.    DISCHARGE CONDITIONS:   Stable  CONSULTS OBTAINED:    DRUG ALLERGIES:  No Known Allergies  DISCHARGE MEDICATIONS:   Allergies as of 12/11/2017   No Known Allergies      Medication List    TAKE these medications   acetaminophen 500 MG tablet Commonly known as:  TYLENOL Take 1,000-1,500 mg by mouth every 6 (six) hours as needed for mild pain or headache.   aspirin 81 MG EC tablet Take 1 tablet (81 mg total) by mouth daily.   CENTRUM ADULTS Tabs Take 1 tablet by mouth every morning.   citalopram 20 MG tablet Commonly known as:  CELEXA Take 20 mg by mouth daily.   COMBIGAN 0.2-0.5 % ophthalmic solution Generic drug:  brimonidine-timolol Place 1 drop into both eyes daily.   docusate sodium 100 MG capsule Commonly known as:  COLACE Take 1 capsule (100 mg total) by mouth 2 (two) times daily.   donepezil 5 MG tablet Commonly known as:  ARICEPT Take 5 mg by mouth at bedtime.   fluticasone 50 MCG/ACT nasal spray Commonly known as:  FLONASE Place 2 sprays into both nostrils daily.   hydrALAZINE 25 MG tablet Commonly known as:  APRESOLINE Take 1 tablet (25 mg total) by mouth every 8 (eight) hours.   hydrochlorothiazide 12.5 MG tablet Commonly known as:  HYDRODIURIL Take 1 tablet (12.5 mg total) by mouth daily.   Insulin Glargine 300 UNIT/ML Sopn Commonly known as:  TOUJEO SOLOSTAR Inject 30 Units into the skin at bedtime. What changed:  how much to take   insulin lispro 100 UNIT/ML injection Commonly known as:  HUMALOG Inject 0.04 mLs (4 Units total) into the skin 3 (three) times daily before meals. Pt uses per sliding scale. What  changed:  how much to take   isosorbide mononitrate 30 MG 24 hr tablet Commonly known as:  IMDUR Take 1 tablet (30 mg total) by mouth daily.   labetalol 100 MG tablet Commonly known as:  NORMODYNE Take 100 mg by mouth 2 (two) times daily.   loratadine 10 MG tablet Commonly known as:  CLARITIN Take 10 mg by mouth daily.   lovastatin 40 MG tablet Commonly known as:  MEVACOR Take 40 mg by mouth at bedtime.   magnesium oxide 400 MG tablet Commonly known as:  MAG-OX Take 400 mg by mouth daily.    omeprazole 20 MG capsule Commonly known as:  PRILOSEC Take 1 capsule (20 mg total) by mouth daily.   ondansetron 4 MG disintegrating tablet Commonly known as:  ZOFRAN ODT Take 1 tablet (4 mg total) by mouth every 8 (eight) hours as needed for nausea or vomiting.   polyethylene glycol packet Commonly known as:  MIRALAX / GLYCOLAX Take 17 g by mouth daily.   quinapril 20 MG tablet Commonly known as:  ACCUPRIL Take 2 tablets (40 mg total) by mouth daily.         DISCHARGE INSTRUCTIONS:   DIET:  Cardiac diet and Diabetic diet  DISCHARGE CONDITION:  Stable  ACTIVITY:  Activity as tolerated  OXYGEN:  Home Oxygen: No.   Oxygen Delivery: room air  DISCHARGE LOCATION:  home   If you experience worsening of your admission symptoms, develop shortness of breath, life threatening emergency, suicidal or homicidal thoughts you must seek medical attention immediately by calling 911 or calling your MD immediately  if symptoms less severe.  You Must read complete instructions/literature along with all the possible adverse reactions/side effects for all the Medicines you take and that have been prescribed to you. Take any new Medicines after you have completely understood and accpet all the possible adverse reactions/side effects.   Please note  You were cared for by a hospitalist during your hospital stay. If you have any questions about your discharge medications or the care you received while you were in the hospital after you are discharged, you can call the unit and asked to speak with the hospitalist on call if the hospitalist that took care of you is not available. Once you are discharged, your primary care physician will handle any further medical issues. Please note that NO REFILLS for any discharge medications will be authorized once you are discharged, as it is imperative that you return to your primary care physician (or establish a relationship with a primary care physician  if you do not have one) for your aftercare needs so that they can reassess your need for medications and monitor your lab values.  DATA REVIEW:   CBC Recent Labs  Lab 12/10/17 1527  WBC 5.9  HGB 12.2  HCT 37.2  PLT 357    Chemistries  Recent Labs  Lab 12/10/17 1146  NA 136  K 3.8  CL 102  CO2 28  GLUCOSE 86  BUN 18  CREATININE 1.02*  CALCIUM 8.9    Cardiac Enzymes No results for input(s): TROPONINI in the last 168 hours.  Microbiology Results  Results for orders placed or performed during the hospital encounter of 05/13/17  Urine culture     Status: Abnormal   Collection Time: 05/13/17  1:36 AM  Result Value Ref Range Status   Specimen Description URINE, RANDOM  Final   Special Requests NONE  Final   Culture MULTIPLE SPECIES PRESENT, SUGGEST RECOLLECTION (  A)  Final   Report Status 05/14/2017 FINAL  Final    RADIOLOGY:  No results found.    Management plans discussed with the patient, family and they are in agreement.  CODE STATUS:  Code Status History    Date Active Date Inactive Code Status Order ID Comments User Context   12/10/2017 1526 12/11/2017 2011 Full Code [id number]  Shaune Pollack, MD Inpatient    TOTAL TIME TAKING CARE OF THIS PATIENT: 40 minutes.    Houston Siren M.D on 12/12/2017 at 4:19 PM  Between 7am to 6pm - Pager - [phone]  After 6pm go to [email or web address] - Social research officer, government  [facility] [place] [facility]  Office  [phone]  CC: Primary care physician; Lynnea Ferrier, MD

## 2018-03-11 ENCOUNTER — Encounter: Payer: Self-pay | Admitting: Emergency Medicine

## 2018-03-11 ENCOUNTER — Emergency Department: Payer: Medicare Other

## 2018-03-11 ENCOUNTER — Emergency Department
Admission: EM | Admit: 2018-03-11 | Discharge: 2018-03-11 | Disposition: A | Discharge status: Home / Self Care | Payer: Medicare Other | Attending: Emergency Medicine | Admitting: Emergency Medicine

## 2018-03-11 ENCOUNTER — Other Ambulatory Visit: Payer: Self-pay

## 2018-03-11 DIAGNOSIS — E119 Type 2 diabetes mellitus without complications: Secondary | ICD-10-CM | POA: Diagnosis not present

## 2018-03-11 DIAGNOSIS — Z794 Long term (current) use of insulin: Secondary | ICD-10-CM | POA: Insufficient documentation

## 2018-03-11 DIAGNOSIS — Z79899 Other long term (current) drug therapy: Secondary | ICD-10-CM | POA: Insufficient documentation

## 2018-03-11 DIAGNOSIS — I252 Old myocardial infarction: Secondary | ICD-10-CM | POA: Diagnosis not present

## 2018-03-11 DIAGNOSIS — F0390 Unspecified dementia without behavioral disturbance: Secondary | ICD-10-CM | POA: Diagnosis not present

## 2018-03-11 DIAGNOSIS — Z7982 Long term (current) use of aspirin: Secondary | ICD-10-CM | POA: Diagnosis not present

## 2018-03-11 DIAGNOSIS — I10 Essential (primary) hypertension: Secondary | ICD-10-CM | POA: Insufficient documentation

## 2018-03-11 DIAGNOSIS — R531 Weakness: Secondary | ICD-10-CM

## 2018-03-11 LAB — CBC
HCT: 35.2 % (ref 35.0–47.0)
Hemoglobin: 11.9 g/dL — ABNORMAL LOW (ref 12.0–16.0)
MCH: 31.1 pg (ref 26.0–34.0)
MCHC: 33.7 g/dL (ref 32.0–36.0)
MCV: 92.2 fL (ref 80.0–100.0)
PLATELETS: 239 10*3/uL (ref 150–440)
RBC: 3.81 MIL/uL (ref 3.80–5.20)
RDW: 13.4 % (ref 11.5–14.5)
WBC: 6.3 10*3/uL (ref 3.6–11.0)

## 2018-03-11 LAB — BASIC METABOLIC PANEL
Anion gap: 8 (ref 5–15)
BUN: 18 mg/dL (ref 8–23)
CO2: 25 mmol/L (ref 22–32)
CREATININE: 0.97 mg/dL (ref 0.44–1.00)
Calcium: 9.1 mg/dL (ref 8.9–10.3)
Chloride: 105 mmol/L (ref 98–111)
GFR calc Af Amer: >60 mL/min (ref >60)
GFR, EST NON AFRICAN AMERICAN: 54 mL/min — AB (ref >60)
GLUCOSE: 198 mg/dL — AB (ref 70–99)
POTASSIUM: 4.2 mmol/L (ref 3.5–5.1)
SODIUM: 138 mmol/L (ref 135–145)

## 2018-03-11 LAB — URINALYSIS, COMPLETE (UACMP) WITH MICROSCOPIC
Bacteria, UA: NONE SEEN
Bilirubin Urine: NEGATIVE
Glucose, UA: NEGATIVE mg/dL
HGB URINE DIPSTICK: NEGATIVE
Ketones, ur: NEGATIVE mg/dL
LEUKOCYTES UA: NEGATIVE
NITRITE: NEGATIVE
PH: 6 (ref 5.0–8.0)
Protein, ur: NEGATIVE mg/dL
SPECIFIC GRAVITY, URINE: 1.016 (ref 1.005–1.030)

## 2018-03-11 LAB — GLUCOSE, CAPILLARY: GLUCOSE-CAPILLARY: 183 mg/dL — AB (ref 70–99)

## 2018-03-11 LAB — TROPONIN I

## 2018-03-11 NOTE — Discharge Instructions (Signed)
It was a pleasure to take care of you today, and thank you for coming to our emergency department.  If you have any questions or concerns before leaving please ask the nurse to grab me and I'm more than happy to go through your aftercare instructions again.  If you were prescribed any opioid pain medication today such as Norco, Vicodin, Percocet, morphine, hydrocodone, or oxycodone please make sure you do not drive when you are taking this medication as it can alter your ability to drive safely.  If you have any concerns once you are home that you are not improving or are in fact getting worse before you can make it to your follow-up appointment, please do not hesitate to call 911 and come back for further evaluation.  Merrily BrittleNeil Hina Gupta, MD  Results for orders placed or performed during the hospital encounter of 03/11/18  Basic metabolic panel  Result Value Ref Range   Sodium 138 135 - 145 mmol/L   Potassium 4.2 3.5 - 5.1 mmol/L   Chloride 105 98 - 111 mmol/L   CO2 25 22 - 32 mmol/L   Glucose, Bld 198 (H) 70 - 99 mg/dL   BUN 18 8 - 23 mg/dL   Creatinine, Ser 4.090.97 0.44 - 1.00 mg/dL   Calcium 9.1 8.9 - 81.110.3 mg/dL   GFR calc non Af Amer 54 (L) >60 mL/min   GFR calc Af Amer >60 >60 mL/min   Anion gap 8 5 - 15  CBC  Result Value Ref Range   WBC 6.3 3.6 - 11.0 K/uL   RBC 3.81 3.80 - 5.20 MIL/uL   Hemoglobin 11.9 (L) 12.0 - 16.0 g/dL   HCT 91.435.2 78.235.0 - 95.647.0 %   MCV 92.2 80.0 - 100.0 fL   MCH 31.1 26.0 - 34.0 pg   MCHC 33.7 32.0 - 36.0 g/dL   RDW 21.313.4 08.611.5 - 57.814.5 %   Platelets 239 150 - 440 K/uL  Urinalysis, Complete w Microscopic  Result Value Ref Range   Color, Urine YELLOW (A) YELLOW   APPearance CLEAR (A) CLEAR   Specific Gravity, Urine 1.016 1.005 - 1.030   pH 6.0 5.0 - 8.0   Glucose, UA NEGATIVE NEGATIVE mg/dL   Hgb urine dipstick NEGATIVE NEGATIVE   Bilirubin Urine NEGATIVE NEGATIVE   Ketones, ur NEGATIVE NEGATIVE mg/dL   Protein, ur NEGATIVE NEGATIVE mg/dL   Nitrite NEGATIVE  NEGATIVE   Leukocytes, UA NEGATIVE NEGATIVE   RBC / HPF 0-5 0 - 5 RBC/hpf   WBC, UA 0-5 0 - 5 WBC/hpf   Bacteria, UA NONE SEEN NONE SEEN   Squamous Epithelial / LPF 0-5 0 - 5   Hyaline Casts, UA PRESENT   Troponin I  Result Value Ref Range   Troponin I <0.03 <0.03 ng/mL  Glucose, capillary  Result Value Ref Range   Glucose-Capillary 183 (H) 70 - 99 mg/dL   Dg Chest 2 View  Result Date: 03/11/2018 CLINICAL DATA:  Shortness of breath EXAM: CHEST - 2 VIEW COMPARISON:  Chest radiograph December 02, 2017 and chest CT Dec 04, 2017 FINDINGS: There is no evident edema or consolidation. Heart is upper normal in size with pulmonary vascularity within normal limits. No adenopathy. There is aortic atherosclerosis. No evident bone lesions. There are surgical clips in the right upper quadrant of the abdomen. IMPRESSION: No edema or consolidation. Heart upper normal in size. There is aortic atherosclerosis. Aortic Atherosclerosis (ICD10-I70.0). Electronically Signed   By: Bretta BangWilliam  Woodruff III M.D.   On:  03/11/2018 13:28  ° ° °

## 2018-03-11 NOTE — ED Provider Notes (Signed)
[facility] Emergency Department Provider Note  ____________________________________________   First MD Initiated Contact with Patient 03/11/18 1302     (approximate)  I have reviewed the triage vital signs and the nursing notes.   HISTORY  Chief Complaint Weakness and Shortness of Breath    HPI Vicki LucksRoberta Grayce SessionsLee [name] is a 77 y.o. female who self presents to the emergency department feeling generally "weak and tired" when she woke up this morning.  She was in her usual state of health last night however when she awoke this morning she felt "different".  She reports mild shortness of breath.  No pain.  She has had increasing amounts of stress at home recently and she is concerned that this could represent "something with my heart".  Symptoms have been sudden onset constant are worsened by stress and somewhat improved with rest.  She denies abdominal pain nausea or vomiting.    Past Medical History:  Diagnosis Date  . Diabetes mellitus without complication (HCC)   . GERD (gastroesophageal reflux disease)   . Hypertension   . Lesion of sciatic nerve, right side   . MI (myocardial infarction) (HCC)   . Stroke [facility](HCC)     Patient Active Problem List   Diagnosis Date Noted  . Hypoglycemia 12/10/2017  . 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 NSTEMI (non-ST elevated myocardial infarction) (HCC) 10/01/2016  . 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 Diabetes (HCC) 09/29/2016  . GERD (gastroesophageal reflux disease) 09/29/2016  . Nausea and vomiting 03/24/2016  . Diffuse abdominal pain 03/24/2016  . Hypokalemia 03/24/2016  . Chest pain 03/24/2016  . Elevated troponin I level 09/26/2015  . DKA (diabetic ketoacidoses) (HCC) 09/25/2015    Past Surgical History:  Procedure Laterality Date  . ABDOMINAL HYSTERECTOMY    . BREAST BIOPSY Right   . BREAST SURGERY    . LEFT HEART CATH AND  CORONARY ANGIOGRAPHY N/A 10/01/2016   Procedure: Left Heart Cath and Coronary Angiography and possible PCI;  Surgeon: Alwyn Peawayne D Callwood, MD;  Location: [facility] INVASIVE CV LAB;  Service: Cardiovascular;  Laterality: N/A;    Prior to Admission medications   Medication Sig Start Date End Date Taking? Authorizing Provider  acetaminophen (TYLENOL) 500 MG tablet Take 1,000-1,500 mg by mouth every 6 (six) hours as needed for mild pain or headache.    [provider]  aspirin EC 81 MG EC tablet Take 1 tablet (81 mg total) by mouth daily. 03/26/16   [name], [name], MD  brimonidine-timolol (COMBIGAN) 0.2-0.5 % ophthalmic solution Place 1 drop into both eyes daily.    [provider]  citalopram (CELEXA) 20 MG tablet Take 20 mg by mouth daily.    [provider]  docusate sodium (COLACE) 100 MG capsule Take 1 capsule (100 mg total) by mouth 2 (two) times daily. 10/04/16   [name], [name], MD  donepezil (ARICEPT) 5 MG tablet Take 5 mg by mouth at bedtime.    [provider]  fluticasone (FLONASE) 50 MCG/ACT nasal spray Place 2 sprays into both nostrils daily.    [provider]  hydrALAZINE (APRESOLINE) 50 MG tablet Take 1 tablet (50 mg total) by mouth every 8 (eight) hours. 03/12/18   Emily FilbertWilliams, Jonathan E, MD  hydrochlorothiazide (HYDRODIURIL) 12.5 MG tablet Take 1 tablet (12.5 mg total) by mouth daily. 10/03/16   [name], [name], MD  Insulin Glargine (TOUJEO SOLOSTAR) 300 UNIT/ML SOPN Inject 30 Units into the skin at  bedtime. 12/11/17   Houston Siren, MD  insulin lispro (HUMALOG) 100 UNIT/ML injection Inject 0.04 mLs (4 Units total) into the skin 3 (three) times daily before meals. Pt uses per sliding scale. 12/11/17   Houston Siren, MD  isosorbide mononitrate (IMDUR) 30 MG 24 hr tablet Take 1 tablet (30 mg total) by mouth daily. 03/26/16   Katharina Caper, MD  labetalol (NORMODYNE) 100 MG tablet Take 100 mg by mouth 2 (two) times daily.    [provider]    loratadine (CLARITIN) 10 MG tablet Take 10 mg by mouth daily.    [provider]  lovastatin (MEVACOR) 40 MG tablet Take 40 mg by mouth at bedtime.     [provider]  magnesium oxide (MAG-OX) 400 MG tablet Take 400 mg by mouth daily.    [provider]  Multiple Vitamins-Minerals (CENTRUM ADULTS) TABS Take 1 tablet by mouth every morning.     [provider]  omeprazole (PRILOSEC) 20 MG capsule Take 1 capsule (20 mg total) by mouth daily. 09/28/15   Katha Hamming, MD  ondansetron (ZOFRAN ODT) 4 MG disintegrating tablet Take 1 tablet (4 mg total) by mouth every 8 (eight) hours as needed for nausea or vomiting. 05/13/17   Irean Hong, MD  polyethylene glycol [facility] Ethelene Hal) packet Take 17 g by mouth daily. 05/03/15   [name], MD  quinapril (ACCUPRIL) 20 MG tablet Take 2 tablets (40 mg total) by mouth daily. 10/01/16   Auburn Bilberry, MD  traMADol (ULTRAM) 50 MG tablet Take 1 tablet (50 mg total) by mouth every 12 (twelve) hours as needed for moderate pain. 03/12/18 03/12/19  [name], MD    Allergies Patient has no known allergies.  Family History  Problem Relation Age of Onset  . Heart disease Mother   . Diabetes Mellitus II Father   . Hypertension Sister   . Diabetes Mellitus II Sister   . Breast cancer Sister 51  . Hypertension Brother   . Diabetes Mellitus II Brother     Social History Social History   Tobacco Use  . Smoking status: Never Smoker  . Smokeless tobacco: Never Used  Substance Use Topics  . Alcohol use: No  . Drug use: No    Review of Systems Constitutional: No fever/chills Eyes: No visual changes. ENT: No sore throat. Cardiovascular: Denies chest pain. Respiratory: Positive for shortness of breath. Gastrointestinal: No abdominal pain.  No nausea, no vomiting.  No diarrhea.  No constipation. Genitourinary: Negative for dysuria. Musculoskeletal: Negative for back pain. Skin: Negative for  rash. Neurological: Negative for headaches, focal weakness or numbness.   ____________________________________________   PHYSICAL EXAM:  VITAL SIGNS: ED Triage Vitals [03/11/18 1243]  Enc Vitals Group     BP (!) 147/67     Pulse Rate 61     Resp 18     Temp 98.5 F (36.9 C)     Temp Source Oral     SpO2 96 %     Weight 156 lb (70.8 kg)     Height 5\' 4"  (1.626 m)     Head Circumference      Peak Flow      Pain Score 0     Pain Loc      Pain Edu?      Excl. in GC?     Constitutional: Alert and oriented x4 somewhat anxious appearing although nontoxic no diaphoresis speaks in full clear sentences Eyes: PERRL EOMI. Head:  Atraumatic. Nose: No congestion/rhinnorhea. Mouth/Throat: No trismus Neck: No stridor.   Cardiovascular: Normal rate, regular rhythm. Grossly normal heart sounds.  Good peripheral circulation. Respiratory: Normal respiratory effort.  No retractions. Lungs CTAB and moving good air Gastrointestinal: Soft nontender Musculoskeletal: No lower extremity edema   Neurologic:  Normal speech and language. No gross focal neurologic deficits are appreciated. Skin:  Skin is warm, dry and intact. No rash noted. Psychiatric: Anxious appearing   ____________________________________________   DIFFERENTIAL includes but not limited to  Pneumonia, pneumothorax, pulmonary embolism, urinary tract infection, metabolic derangement, anxiety ____________________________________________   LABS (all labs ordered are listed, but only abnormal results are displayed)  Labs Reviewed  BASIC METABOLIC PANEL - Abnormal; Notable for the following components:      Result Value   Glucose, Bld 198 (*)    GFR calc non Af Amer 54 (*)    All other components within normal limits  CBC - Abnormal; Notable for the following components:   Hemoglobin 11.9 (*)    All other components within normal limits  URINALYSIS, COMPLETE (UACMP) WITH MICROSCOPIC - Abnormal; Notable for the following  components:   Color, Urine YELLOW (*)    APPearance CLEAR (*)    All other components within normal limits  GLUCOSE, CAPILLARY - Abnormal; Notable for the following components:   Glucose-Capillary 183 (*)    All other components within normal limits  TROPONIN I    Lab work reviewed by me with no acute disease __________________________________________  EKG  ED ECG REPORT I, Merrily Brittle, the attending physician, personally viewed and interpreted this ECG.  Date: 03/13/2018 EKG Time:  Rate: 60 Rhythm: normal sinus rhythm QRS Axis: Leftward axis Intervals: normal ST/T Wave abnormalities: normal Narrative Interpretation: no evidence of acute ischemia  ____________________________________________  RADIOLOGY  Chest x-ray reviewed by me with no acute disease ____________________________________________   PROCEDURES  Procedure(s) performed: no  Procedures  Critical Care performed: no  ____________________________________________   INITIAL IMPRESSION / ASSESSMENT AND PLAN / ED COURSE  Pertinent labs & imaging results that were available during my care of the patient were reviewed by me and considered in my medical decision making (see chart for details).   As part of my medical decision making, I reviewed the following data within the electronic MEDICAL RECORD NUMBER History obtained from family if available, nursing notes, old chart and ekg, as well as notes from prior ED visits.  The patient arrives afebrile with an unremarkable exam although with generalized malaise.  Lab work, chest x-ray, urinalysis, and EKG are all reassuring.  She was kept on monitor multiple hours with no ectopy.  I do lengthy discussion with the patient regarding the diagnostic uncertainty but at this point I was comfortable having her follow-up with primary care.  She verbalizes understanding and agreement with the plan.      ____________________________________________   FINAL CLINICAL  IMPRESSION(S) / ED DIAGNOSES  Final diagnoses:  Weakness      NEW MEDICATIONS STARTED DURING THIS VISIT:  Discharge Medication List as of 03/11/2018  3:02 PM       Note:  This document was prepared using Dragon voice recognition software and may include unintentional dictation errors.     Merrily Brittle, MD 03/13/18 2137

## 2018-03-11 NOTE — ED Notes (Signed)
Pt states she woke up this morning feeling week. Pt was helping brother and was overwhelmed. Pt ambulatory to restroom w/o assistance at this time. Pt on BP and 02 monitor at this time.

## 2018-03-11 NOTE — ED Triage Notes (Signed)
Pt arrived from home with complaints of weakness starting when she woke up. Pt ambulates independently with a steady gait in triage and denies any pain. Pt states she went to bed last night "and felt fine." Pt states she woke up this morning "feeling weak" and then she became short of breath and called her brother. Pt in NAD in triage. Pt states she is under a lot of stress and thought many something was wrong with her heart.

## 2018-03-12 ENCOUNTER — Emergency Department: Payer: Medicare Other

## 2018-03-12 ENCOUNTER — Emergency Department
Admission: EM | Admit: 2018-03-12 | Discharge: 2018-03-12 | Disposition: A | Discharge status: Home / Self Care | Payer: Medicare Other | Attending: Emergency Medicine | Admitting: Emergency Medicine

## 2018-03-12 DIAGNOSIS — E119 Type 2 diabetes mellitus without complications: Secondary | ICD-10-CM | POA: Diagnosis not present

## 2018-03-12 DIAGNOSIS — I252 Old myocardial infarction: Secondary | ICD-10-CM | POA: Insufficient documentation

## 2018-03-12 DIAGNOSIS — R51 Headache: Secondary | ICD-10-CM | POA: Insufficient documentation

## 2018-03-12 DIAGNOSIS — F0390 Unspecified dementia without behavioral disturbance: Secondary | ICD-10-CM | POA: Insufficient documentation

## 2018-03-12 DIAGNOSIS — I10 Essential (primary) hypertension: Secondary | ICD-10-CM | POA: Diagnosis not present

## 2018-03-12 DIAGNOSIS — R519 Headache, unspecified: Secondary | ICD-10-CM

## 2018-03-12 MED ORDER — HYDRALAZINE HCL 50 MG PO TABS
25.0000 mg | ORAL_TABLET | Freq: Once | ORAL | Status: AC
  Administered 2018-03-12: 25 mg via ORAL
  Filled 2018-03-12: qty 0.5
  Filled 2018-03-12: qty 1

## 2018-03-12 MED ORDER — ACETAMINOPHEN 500 MG PO TABS
1000.0000 mg | ORAL_TABLET | Freq: Once | ORAL | Status: AC
Start: 2018-03-12 — End: 2018-03-12
  Administered 2018-03-12: 1000 mg via ORAL
  Filled 2018-03-12: qty 2

## 2018-03-12 MED ORDER — METOCLOPRAMIDE HCL 10 MG PO TABS
10.0000 mg | ORAL_TABLET | Freq: Once | ORAL | Status: AC
  Administered 2018-03-12: 10 mg via ORAL
  Filled 2018-03-12: qty 1

## 2018-03-12 MED ORDER — HYDRALAZINE HCL 50 MG PO TABS: 50.0000 mg | ORAL_TABLET | Freq: Three times a day (TID) | ORAL | 1 refills | Status: DC

## 2018-03-12 MED ORDER — TRAMADOL HCL 50 MG PO TABS: 50.0000 mg | ORAL_TABLET | Freq: Two times a day (BID) | ORAL | 0 refills | Status: AC | PRN

## 2018-03-12 NOTE — ED Provider Notes (Signed)
[facility] Emergency Department Provider Note       Time seen: ----------------------------------------- 4:10 PM on 03/12/2018 -----------------------------------------   I have reviewed the triage vital signs and the nursing notes.  HISTORY   Chief Complaint Headache    HPI Vicki [name] is a 77 y.o. female with a history of diabetes, GERD, hypertension, MI, CVA who presents to the ED for headache since yesterday.  Patient reports weakness then headache.  She denies taking anything for it.  Yesterday prior to her evaluation she was helping her brother and thinks she may have overdone it.  She states she is taking her medications as prescribed.  She denies any recent illness.  Past Medical History:  Diagnosis Date  . Diabetes mellitus without complication (HCC)   . GERD (gastroesophageal reflux disease)   . Hypertension   . Lesion of sciatic nerve, right side   . MI (myocardial infarction) (HCC)   . Stroke [facility](HCC)     Patient Active Problem List   Diagnosis Date Noted  . Hypoglycemia 12/10/2017  . 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 NSTEMI (non-ST elevated myocardial infarction) (HCC) 10/01/2016  . 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 Diabetes (HCC) 09/29/2016  . GERD (gastroesophageal reflux disease) 09/29/2016  . Nausea and vomiting 03/24/2016  . Diffuse abdominal pain 03/24/2016  . Hypokalemia 03/24/2016  . Chest pain 03/24/2016  . Elevated troponin I level 09/26/2015  . DKA (diabetic ketoacidoses) (HCC) 09/25/2015    Past Surgical History:  Procedure Laterality Date  . ABDOMINAL HYSTERECTOMY    . BREAST BIOPSY Right   . BREAST SURGERY    . LEFT HEART CATH AND CORONARY ANGIOGRAPHY N/A 10/01/2016   Procedure: Left Heart Cath and Coronary Angiography and possible PCI;  Surgeon: Alwyn Peawayne D Callwood, MD;  Location: [facility] INVASIVE CV LAB;  Service: Cardiovascular;   Laterality: N/A;    Allergies Patient has no known allergies.  Social History Social History   Tobacco Use  . Smoking status: Never Smoker  . Smokeless tobacco: Never Used  Substance Use Topics  . Alcohol use: No  . Drug use: No   Review of Systems Constitutional: Negative for fever. Cardiovascular: Negative for chest pain. Respiratory: Negative for shortness of breath. Gastrointestinal: Negative for abdominal pain, vomiting and diarrhea. Musculoskeletal: Negative for back pain. Skin: Negative for rash. Neurological: Positive for headache, weakness  All systems negative/normal/unremarkable except as stated in the HPI  ____________________________________________   PHYSICAL EXAM:  VITAL SIGNS: ED Triage Vitals  Enc Vitals Group     BP 03/12/18 1400 (!) 179/71     Pulse Rate 03/12/18 1400 67     Resp 03/12/18 1400 15     Temp 03/12/18 1400 98.6 F (37 C)     Temp Source 03/12/18 1400 Oral     SpO2 03/12/18 1400 99 %     Weight 03/12/18 1401 150 lb (68 kg)     Height 03/12/18 1401 5\' 3"  (1.6 m)     Head Circumference --      Peak Flow --      Pain Score 03/12/18 1409 5     Pain Loc --      Pain Edu? --      Excl. in GC? --    Constitutional: Alert and oriented. Well appearing and in no distress. Eyes: Conjunctivae are normal. Normal extraocular movements. ENT   Head: Normocephalic and atraumatic.   Nose: No congestion/rhinnorhea.  Mouth/Throat: Mucous membranes are moist.   Neck: No stridor. Cardiovascular: Normal rate, regular rhythm. No murmurs, rubs, or gallops. Respiratory: Normal respiratory effort without tachypnea nor retractions. Breath sounds are clear and equal bilaterally. No wheezes/rales/rhonchi. Gastrointestinal: Soft and nontender. Normal bowel sounds Musculoskeletal: Nontender with normal range of motion in extremities. No lower extremity tenderness nor edema. Neurologic:  Normal speech and language. No gross focal neurologic  deficits are appreciated.  Skin:  Skin is warm, dry and intact. No rash noted. Psychiatric: Mood and affect are normal. Speech and behavior are normal.  ____________________________________________  ED COURSE:  As part of my medical decision making, I reviewed the following data within the electronic MEDICAL RECORD NUMBER History obtained from family if available, nursing notes, old chart and ekg, as well as notes from prior ED visits. Patient presented for headache and weakness, we will assess with imaging as indicated at this time.  Labs performed yesterday were unremarkable   Procedures ____________________________________________   RADIOLOGY  CT head does not reveal any acute process  ____________________________________________  DIFFERENTIAL DIAGNOSIS   Migraine, tension headache, hypertension, generalized weakness  FINAL ASSESSMENT AND PLAN  Headache   Plan: The patient had presented for weakness and headache. Patient's labs yesterday were normal. Patient's imaging today did not reveal any acute process.  We did give her a dose of headache medication as well as an additional dose of hydralazine.  We will advise close outpatient follow-up with her doctor.   Ulice Dash, MD   Note: This note was generated in part or whole with voice recognition software. Voice recognition is usually quite accurate but there are transcription errors that can and very often do occur. I apologize for any typographical errors that were not detected and corrected.     [name], MD 03/12/18 [phone]

## 2018-03-12 NOTE — ED Notes (Signed)
Pt returns with headache since yesterday. She reports weakness, then headache. She denies taking anything for it. Pt alert & oriented with NAD noted.

## 2018-03-12 NOTE — ED Triage Notes (Signed)
Pt arrives with complaints of weakness and headache. Pt seen for same concerns yesterday. PT denies any new complaints but states "it hasn't gotten better." Pt ambulates to triage room independently with steady gait, no facial droop, grips equal and strong, a & o x 4.

## 2018-03-12 NOTE — ED Notes (Signed)
Spoke with MD Mayford KnifeWilliams, see new orders
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
## 2018-06-11 ENCOUNTER — Emergency Department
Admission: EM | Admit: 2018-06-11 | Discharge: 2018-06-11 | Disposition: A | Discharge status: Home / Self Care | Payer: Medicare Other | Attending: Emergency Medicine | Admitting: Emergency Medicine

## 2018-06-11 ENCOUNTER — Encounter: Payer: Self-pay | Admitting: Emergency Medicine

## 2018-06-11 ENCOUNTER — Other Ambulatory Visit: Payer: Self-pay

## 2018-06-11 DIAGNOSIS — E860 Dehydration: Secondary | ICD-10-CM | POA: Diagnosis not present

## 2018-06-11 DIAGNOSIS — N179 Acute kidney failure, unspecified: Secondary | ICD-10-CM | POA: Diagnosis not present

## 2018-06-11 DIAGNOSIS — Z79899 Other long term (current) drug therapy: Secondary | ICD-10-CM | POA: Insufficient documentation

## 2018-06-11 DIAGNOSIS — E119 Type 2 diabetes mellitus without complications: Secondary | ICD-10-CM | POA: Diagnosis not present

## 2018-06-11 DIAGNOSIS — I10 Essential (primary) hypertension: Secondary | ICD-10-CM | POA: Insufficient documentation

## 2018-06-11 DIAGNOSIS — R319 Hematuria, unspecified: Secondary | ICD-10-CM

## 2018-06-11 DIAGNOSIS — N390 Urinary tract infection, site not specified: Secondary | ICD-10-CM

## 2018-06-11 DIAGNOSIS — R55 Syncope and collapse: Secondary | ICD-10-CM | POA: Diagnosis not present

## 2018-06-11 DIAGNOSIS — N3001 Acute cystitis with hematuria: Secondary | ICD-10-CM | POA: Diagnosis not present

## 2018-06-11 DIAGNOSIS — Z794 Long term (current) use of insulin: Secondary | ICD-10-CM | POA: Diagnosis not present

## 2018-06-11 DIAGNOSIS — Z7982 Long term (current) use of aspirin: Secondary | ICD-10-CM | POA: Insufficient documentation

## 2018-06-11 DIAGNOSIS — E876 Hypokalemia: Secondary | ICD-10-CM | POA: Diagnosis not present

## 2018-06-11 DIAGNOSIS — R531 Weakness: Secondary | ICD-10-CM | POA: Diagnosis present

## 2018-06-11 DIAGNOSIS — F0390 Unspecified dementia without behavioral disturbance: Secondary | ICD-10-CM | POA: Insufficient documentation

## 2018-06-11 LAB — URINALYSIS, COMPLETE (UACMP) WITH MICROSCOPIC
Bacteria, UA: NONE SEEN
Bilirubin Urine: NEGATIVE
Glucose, UA: NEGATIVE mg/dL
HGB URINE DIPSTICK: NEGATIVE
Ketones, ur: 5 mg/dL — AB
Nitrite: NEGATIVE
Protein, ur: 30 mg/dL — AB
SPECIFIC GRAVITY, URINE: 1.026 (ref 1.005–1.030)
WBC, UA: >50 WBC/hpf — ABNORMAL HIGH (ref 0–5)
pH: 5 (ref 5.0–8.0)

## 2018-06-11 LAB — BASIC METABOLIC PANEL
ANION GAP: 9 (ref 5–15)
BUN: 23 mg/dL (ref 8–23)
CALCIUM: 10.1 mg/dL (ref 8.9–10.3)
CO2: 29 mmol/L (ref 22–32)
Chloride: 102 mmol/L (ref 98–111)
Creatinine, Ser: 1.13 mg/dL — ABNORMAL HIGH (ref 0.44–1.00)
GFR, EST AFRICAN AMERICAN: 52 mL/min — AB (ref >60)
GFR, EST NON AFRICAN AMERICAN: 45 mL/min — AB (ref >60)
Glucose, Bld: 83 mg/dL (ref 70–99)
Potassium: 3.2 mmol/L — ABNORMAL LOW (ref 3.5–5.1)
Sodium: 140 mmol/L (ref 135–145)

## 2018-06-11 LAB — CBC
HCT: 38.8 % (ref 36.0–46.0)
Hemoglobin: 12.7 g/dL (ref 12.0–15.0)
MCH: 29.9 pg (ref 26.0–34.0)
MCHC: 32.7 g/dL (ref 30.0–36.0)
MCV: 91.3 fL (ref 80.0–100.0)
NRBC: 0 % (ref 0.0–0.2)
PLATELETS: 297 10*3/uL (ref 150–400)
RBC: 4.25 MIL/uL (ref 3.87–5.11)
RDW: 12 % (ref 11.5–15.5)
WBC: 7 10*3/uL (ref 4.0–10.5)

## 2018-06-11 LAB — TROPONIN I

## 2018-06-11 MED ORDER — SODIUM CHLORIDE 0.9 % IV SOLN
1.0000 g | Freq: Once | INTRAVENOUS | Status: AC
  Administered 2018-06-11: 1 g via INTRAVENOUS
  Filled 2018-06-11: qty 10

## 2018-06-11 MED ORDER — SODIUM CHLORIDE 0.9 % IV BOLUS
1000.0000 mL | Freq: Once | INTRAVENOUS | Status: AC
  Administered 2018-06-11: 1000 mL via INTRAVENOUS

## 2018-06-11 MED ORDER — POTASSIUM CHLORIDE CRYS ER 20 MEQ PO TBCR
40.0000 meq | EXTENDED_RELEASE_TABLET | Freq: Once | ORAL | Status: AC
  Administered 2018-06-11: 40 meq via ORAL
  Filled 2018-06-11: qty 2

## 2018-06-11 MED ORDER — CEPHALEXIN 500 MG PO CAPS: 500.0000 mg | ORAL_CAPSULE | Freq: Two times a day (BID) | ORAL | 0 refills | Status: DC

## 2018-06-11 NOTE — Discharge Instructions (Addendum)
You are evaluated for generalized weakness and found to have evidence of dehydration and given IV fluids in the emergency department.  Your potassium was low and you are given potassium supplement in the emergency department.  You are being treated for urinary tract infection with Keflex.  Return to the emergency department immediately for any worsening symptoms including any weakness on one side the other, trouble speaking, facial droop, chest pain, fever, dizziness or passing out, or any other symptoms concerning to you.

## 2018-06-11 NOTE — ED Triage Notes (Addendum)
PT from home via EMS with c/o generalized weakness and leg cramps since this am. PT received pneumonia and flu vaccine yesterday at PCP . Denies fever. NSR and VSS per EMS

## 2018-06-11 NOTE — ED Provider Notes (Addendum)
[facility] Emergency Department Provider Note ____________________________________________   I have reviewed the triage vital signs and the triage nursing note.  HISTORY  Chief Complaint Weakness   Historian Patient  HPI Vicki [name] is a 77 y.o. female presents from home stating that she felt weak all over today.  Reports feeling like she might pass out the bathroom earlier but did not pass out.  No chest pain.  No palpitations.  No nausea vomiting or diarrhea.  States that she think she may be dehydrated.  Denies urinary symptoms.  Denies abdominal pain.  States that she felt like her stomach and legs were cramping up on her today.  No slurred speech or facial droop or focal weakness or numbness.     Past Medical History:  Diagnosis Date  . Diabetes mellitus without complication (HCC)   . GERD (gastroesophageal reflux disease)   . Hypertension   . Lesion of sciatic nerve, right side   . MI (myocardial infarction) (HCC)   . Stroke [facility])     Patient Active Problem List   Diagnosis Date Noted  . Hypoglycemia 12/10/2017  . 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 NSTEMI (non-ST elevated myocardial infarction) (HCC) 10/01/2016  . 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 Diabetes (HCC) 09/29/2016  . GERD (gastroesophageal reflux disease) 09/29/2016  . Nausea and vomiting 03/24/2016  . Diffuse abdominal pain 03/24/2016  . Hypokalemia 03/24/2016  . Chest pain 03/24/2016  . Elevated troponin I level 09/26/2015  . DKA (diabetic ketoacidoses) (HCC) 09/25/2015    Past Surgical History:  Procedure Laterality Date  . ABDOMINAL HYSTERECTOMY    . BREAST BIOPSY Right   . BREAST SURGERY    . LEFT HEART CATH AND CORONARY ANGIOGRAPHY N/A 10/01/2016   Procedure: Left Heart Cath and Coronary Angiography and possible PCI;  Surgeon: Alwyn Pea, MD;  Location: [facility] INVASIVE  CV LAB;  Service: Cardiovascular;  Laterality: N/A;    Prior to Admission medications   Medication Sig Start Date End Date Taking? Authorizing Provider  acetaminophen (TYLENOL) 500 MG tablet Take 1,000-1,500 mg by mouth every 6 (six) hours as needed for mild pain or headache.    [provider]  aspirin EC 81 MG EC tablet Take 1 tablet (81 mg total) by mouth daily. 03/26/16   Katharina Caper, MD  brimonidine-timolol (COMBIGAN) 0.2-0.5 % ophthalmic solution Place 1 drop into both eyes daily.    [provider]  cephALEXin (KEFLEX) 500 MG capsule Take 1 capsule (500 mg total) by mouth 2 (two) times daily. 06/11/18   Governor Rooks, MD  citalopram (CELEXA) 20 MG tablet Take 20 mg by mouth daily.    [provider]  docusate sodium (COLACE) 100 MG capsule Take 1 capsule (100 mg total) by mouth 2 (two) times daily. 10/04/16   Auburn Bilberry, MD  donepezil (ARICEPT) 5 MG tablet Take 5 mg by mouth at bedtime.    [provider]  fluticasone (FLONASE) 50 MCG/ACT nasal spray Place 2 sprays into both nostrils daily.    [provider]  hydrALAZINE (APRESOLINE) 50 MG tablet Take 1 tablet (50 mg total) by mouth every 8 (eight) hours. 03/12/18   [name], MD  hydrochlorothiazide (HYDRODIURIL) 12.5 MG tablet Take 1 tablet (12.5 mg total) by mouth daily. 10/03/16   Auburn Bilberry, MD  Insulin Glargine (TOUJEO SOLOSTAR) 300 UNIT/ML SOPN Inject 30 Units into the skin at bedtime. 12/11/17  Houston Siren, MD  insulin lispro (HUMALOG) 100 UNIT/ML injection Inject 0.04 mLs (4 Units total) into the skin 3 (three) times daily before meals. Pt uses per sliding scale. 12/11/17   Houston Siren, MD  isosorbide mononitrate (IMDUR) 30 MG 24 hr tablet Take 1 tablet (30 mg total) by mouth daily. 03/26/16   Katharina Caper, MD  labetalol (NORMODYNE) 100 MG tablet Take 100 mg by mouth 2 (two) times daily.    [provider]  loratadine (CLARITIN) 10 MG tablet Take 10 mg  by mouth daily.    [provider]  lovastatin (MEVACOR) 40 MG tablet Take 40 mg by mouth at bedtime.     [provider]  magnesium oxide (MAG-OX) 400 MG tablet Take 400 mg by mouth daily.    [provider]  Multiple Vitamins-Minerals (CENTRUM ADULTS) TABS Take 1 tablet by mouth every morning.     [provider]  omeprazole (PRILOSEC) 20 MG capsule Take 1 capsule (20 mg total) by mouth daily. 09/28/15   Katha Hamming, MD  ondansetron (ZOFRAN ODT) 4 MG disintegrating tablet Take 1 tablet (4 mg total) by mouth every 8 (eight) hours as needed for nausea or vomiting. 05/13/17   Irean Hong, MD  polyethylene glycol [facility] / Ethelene Hal) packet Take 17 g by mouth daily. 05/03/15   [name], MD  quinapril (ACCUPRIL) 20 MG tablet Take 2 tablets (40 mg total) by mouth daily. 10/01/16   Auburn Bilberry, MD  traMADol (ULTRAM) 50 MG tablet Take 1 tablet (50 mg total) by mouth every 12 (twelve) hours as needed for moderate pain. 03/12/18 03/12/19  [name], MD    No Known Allergies  Family History  Problem Relation Age of Onset  . Heart disease Mother   . Diabetes Mellitus II Father   . Hypertension Sister   . Diabetes Mellitus II Sister   . Breast cancer Sister 46  . Hypertension Brother   . Diabetes Mellitus II Brother     Social History Social History   Tobacco Use  . Smoking status: Never Smoker  . Smokeless tobacco: Never Used  Substance Use Topics  . Alcohol use: No  . Drug use: No    Review of Systems  Constitutional: Negative for fever. Eyes: Negative for visual changes. ENT: Negative for sore throat. Cardiovascular: Negative for chest pain. Respiratory: Negative for shortness of breath. Gastrointestinal: Negative for abdominal pain, vomiting and diarrhea. Genitourinary: Negative for dysuria. Musculoskeletal: Negative for back pain. Skin: Negative for rash. Neurological: Negative for  headache.  ____________________________________________   PHYSICAL EXAM:  VITAL SIGNS: ED Triage Vitals [06/11/18 1749]  Enc Vitals Group     BP (!) 132/57     Pulse Rate (!) 58     Resp 16     Temp 98.2 F (36.8 C)     Temp Source Oral     SpO2 99 %     Weight      Height      Head Circumference      Peak Flow      Pain Score      Pain Loc      Pain Edu?      Excl. in GC?      Constitutional: Alert and oriented.  HEENT      Head: Normocephalic and atraumatic.      Eyes: Conjunctivae are normal. Pupils equal and round.       Ears:  Nose: No congestion/rhinnorhea.      Mouth/Throat: Mucous membranes are moderately dry.      Neck: No stridor. Cardiovascular/Chest: Normal rate, regular rhythm.  No murmurs, rubs, or gallops. Respiratory: Normal respiratory effort without tachypnea nor retractions. Breath sounds are clear and equal bilaterally. No wheezes/rales/rhonchi. Gastrointestinal: Soft. No distention, no guarding, no rebound. Nontender.    Genitourinary/rectal:Deferred Musculoskeletal: Nontender with normal range of motion in all extremities. No joint effusions.  No lower extremity tenderness.  No edema. Neurologic:  Normal speech and language. No gross or focal neurologic deficits are appreciated. Skin:  Skin is warm, dry and intact. No rash noted. Psychiatric: Mood and affect are normal. Speech and behavior are normal. Patient exhibits appropriate insight and judgment.   ____________________________________________  LABS (pertinent positives/negatives) I, Governor Rooks, MD the attending physician have reviewed the labs noted below.  Labs Reviewed  BASIC METABOLIC PANEL - Abnormal; Notable for the following components:      Result Value   Potassium 3.2 (*)    Creatinine, Ser 1.13 (*)    GFR calc non Af Amer 45 (*)    GFR calc Af Amer 52 (*)    All other components within normal limits  URINALYSIS, COMPLETE (UACMP) WITH MICROSCOPIC - Abnormal;  Notable for the following components:   Color, Urine AMBER (*)    APPearance HAZY (*)    Ketones, ur 5 (*)    Protein, ur 30 (*)    Leukocytes, UA MODERATE (*)    WBC, UA >50 (*)    All other components within normal limits  URINE CULTURE  CBC  TROPONIN I  CBG MONITORING, ED    ____________________________________________    EKG I, Governor Rooks, MD, the attending physician have personally viewed and interpreted all ECGs.  63 beats per minute.  normal sinus rhythm.  Narrow QS.  Normal axis.  Flattened T waves.  Nonspecific. ____________________________________________  RADIOLOGY   None __________________________________________  PROCEDURES  Procedure(s) performed: None  Procedures  Critical Care performed: None   ____________________________________________  ED COURSE / ASSESSMENT AND PLAN  Pertinent labs & imaging results that were available during my care of the patient were reviewed by me and considered in my medical decision making (see chart for details).     Patient reports generalized weakness, no focal deficits by history or on exam.  Patient reports decreased p.o. intake, and certain why, but she does not have fever.  Laboratory studies show hypokalemia which may be why she had some muscle cramping.  Given p.o. repletion here.  Also slightly elevated BUN and creatinine to baseline, likely consistent with dehydration.  Patient given IV fluids here.  No urinary symptoms.  Will send urine culture.  Suspect denies weakness and muscle cramping likely due to dehydration and hypokalemia.  Patient care to be transferred to Dr. Mayford Knife at shift change -- pending IV fluid and orthostatics after iv fluids complete and likely discharge home.  I have prepared discharge instructions.  Going to give a dose of IV Rocephin here for possible urinary tract infection, and discharged on Keflex.   CONSULTATIONS:   None   Patient / Family / Caregiver informed of  clinical course, medical decision-making process, and agree with plan.   I discussed return precautions, follow-up instructions, and discharge instructions with patient and/or family.  Discharge Instructions : You are evaluated for generalized weakness and found to have evidence of dehydration and given IV fluids in the emergency department.  Your potassium was low and you are  given potassium supplement in the emergency department.  You are being treated for urinary tract infection with Keflex.  Return to the emergency department immediately for any worsening symptoms including any weakness on one side the other, trouble speaking, facial droop, chest pain, fever, dizziness or passing out, or any other symptoms concerning to you.     ___________________________________________   FINAL CLINICAL IMPRESSION(S) / ED DIAGNOSES   Final diagnoses:  Dehydration  Hypokalemia  AKI (acute kidney injury) (HCC)  Near syncope  Urinary tract infection with hematuria, site unspecified      ___________________________________________         Note: This dictation was prepared with Dragon dictation. Any transcriptional errors that result from this process are unintentional    Governor Rooks, MD 06/11/18 Sandrea Hughs, MD 06/11/18 2014

## 2018-06-13 LAB — URINE CULTURE: CULTURE: NORMAL — AB

## 2018-06-16 ENCOUNTER — Other Ambulatory Visit: Payer: Self-pay | Admitting: Internal Medicine

## 2018-06-16 DIAGNOSIS — R1084 Generalized abdominal pain: Secondary | ICD-10-CM

## 2018-06-18 ENCOUNTER — Ambulatory Visit
Admission: RE | Admit: 2018-06-18 | Discharge: 2018-06-18 | Disposition: A | Discharge status: Home / Self Care | Payer: Medicare Other | Source: Ambulatory Visit | Attending: Internal Medicine | Admitting: Internal Medicine

## 2018-06-18 DIAGNOSIS — I10 Essential (primary) hypertension: Secondary | ICD-10-CM | POA: Diagnosis not present

## 2018-06-18 DIAGNOSIS — Z9049 Acquired absence of other specified parts of digestive tract: Secondary | ICD-10-CM | POA: Diagnosis not present

## 2018-06-18 DIAGNOSIS — R1084 Generalized abdominal pain: Secondary | ICD-10-CM

## 2018-07-09 ENCOUNTER — Ambulatory Visit
Admission: RE | Admit: 2018-07-09 | Discharge: 2018-07-09 | Disposition: A | Discharge status: Home / Self Care | Payer: Medicare Other | Source: Ambulatory Visit | Attending: Internal Medicine | Admitting: Internal Medicine

## 2018-07-09 DIAGNOSIS — Z1231 Encounter for screening mammogram for malignant neoplasm of breast: Secondary | ICD-10-CM | POA: Diagnosis present

## 2018-08-30 ENCOUNTER — Other Ambulatory Visit: Payer: Self-pay

## 2018-08-30 ENCOUNTER — Emergency Department
Admission: EM | Admit: 2018-08-30 | Discharge: 2018-08-30 | Disposition: A | Discharge status: Home / Self Care | Payer: Medicare Other | Attending: Emergency Medicine | Admitting: Emergency Medicine

## 2018-08-30 DIAGNOSIS — K591 Functional diarrhea: Secondary | ICD-10-CM | POA: Insufficient documentation

## 2018-08-30 DIAGNOSIS — I10 Essential (primary) hypertension: Secondary | ICD-10-CM | POA: Insufficient documentation

## 2018-08-30 DIAGNOSIS — E860 Dehydration: Secondary | ICD-10-CM

## 2018-08-30 DIAGNOSIS — Z79899 Other long term (current) drug therapy: Secondary | ICD-10-CM | POA: Diagnosis not present

## 2018-08-30 DIAGNOSIS — I2510 Atherosclerotic heart disease of native coronary artery without angina pectoris: Secondary | ICD-10-CM | POA: Diagnosis not present

## 2018-08-30 DIAGNOSIS — K219 Gastro-esophageal reflux disease without esophagitis: Secondary | ICD-10-CM | POA: Diagnosis not present

## 2018-08-30 DIAGNOSIS — E119 Type 2 diabetes mellitus without complications: Secondary | ICD-10-CM | POA: Diagnosis not present

## 2018-08-30 DIAGNOSIS — R197 Diarrhea, unspecified: Secondary | ICD-10-CM | POA: Diagnosis present

## 2018-08-30 LAB — COMPREHENSIVE METABOLIC PANEL
ALK PHOS: 55 U/L (ref 38–126)
ALT: 20 U/L (ref 0–44)
AST: 32 U/L (ref 15–41)
Albumin: 4 g/dL (ref 3.5–5.0)
Anion gap: 7 (ref 5–15)
BUN: 20 mg/dL (ref 8–23)
CALCIUM: 8.9 mg/dL (ref 8.9–10.3)
CO2: 25 mmol/L (ref 22–32)
CREATININE: 0.91 mg/dL (ref 0.44–1.00)
Chloride: 104 mmol/L (ref 98–111)
GFR calc non Af Amer: >60 mL/min (ref >60)
Glucose, Bld: 95 mg/dL (ref 70–99)
Potassium: 3.3 mmol/L — ABNORMAL LOW (ref 3.5–5.1)
Sodium: 136 mmol/L (ref 135–145)
TOTAL PROTEIN: 6.8 g/dL (ref 6.5–8.1)
Total Bilirubin: 0.6 mg/dL (ref 0.3–1.2)

## 2018-08-30 LAB — URINALYSIS, COMPLETE (UACMP) WITH MICROSCOPIC
Bacteria, UA: NONE SEEN
Bilirubin Urine: NEGATIVE
Glucose, UA: NEGATIVE mg/dL
Ketones, ur: NEGATIVE mg/dL
NITRITE: NEGATIVE
PH: 6 (ref 5.0–8.0)
Protein, ur: NEGATIVE mg/dL
Specific Gravity, Urine: 1.002 — ABNORMAL LOW (ref 1.005–1.030)
Squamous Epithelial / HPF: NONE SEEN (ref 0–5)

## 2018-08-30 LAB — CBC
HCT: 38.3 % (ref 36.0–46.0)
Hemoglobin: 12.2 g/dL (ref 12.0–15.0)
MCH: 30 pg (ref 26.0–34.0)
MCHC: 31.9 g/dL (ref 30.0–36.0)
MCV: 94.3 fL (ref 80.0–100.0)
PLATELETS: 273 10*3/uL (ref 150–400)
RBC: 4.06 MIL/uL (ref 3.87–5.11)
RDW: 12.4 % (ref 11.5–15.5)
WBC: 7.4 10*3/uL (ref 4.0–10.5)
nRBC: 0 % (ref 0.0–0.2)

## 2018-08-30 LAB — LIPASE, BLOOD: Lipase: 29 U/L (ref 11–51)

## 2018-08-30 MED ORDER — SODIUM CHLORIDE 0.9 % IV BOLUS
1000.0000 mL | Freq: Once | INTRAVENOUS | Status: AC
  Administered 2018-08-30: 1000 mL via INTRAVENOUS

## 2018-08-30 MED ORDER — LOPERAMIDE HCL 2 MG PO TABS: 2.0000 mg | ORAL_TABLET | Freq: Four times a day (QID) | ORAL | 0 refills | Status: DC | PRN

## 2018-08-30 MED ORDER — SODIUM CHLORIDE 0.9% FLUSH
3.0000 mL | Freq: Once | INTRAVENOUS | Status: AC
  Administered 2018-08-30: 3 mL via INTRAVENOUS

## 2018-08-30 NOTE — ED Notes (Signed)
RN Sherrie aware of pt being roomed at this time. RN given brief report

## 2018-08-30 NOTE — ED Notes (Signed)
Pt given water to take her home meds per DR Don Perking.

## 2018-08-30 NOTE — ED Provider Notes (Addendum)
[facility] Emergency Department Provider Note  ____________________________________________  Time seen: Approximately 4:12 PM  I have reviewed the triage vital signs and the nursing notes.   HISTORY  Chief Complaint Diarrhea   HPI Vicki [name] is a 78 y.o. female the history of diabetes, GERD, hypertension, CAD, CVA who presents for evaluation of diarrhea.  Patient reports that she has a history of chronic diarrhea.  Sometimes her diarrhea gets so severe that she becomes dehydrated.  Over the last 24 hours she has had several episodes of watery diarrhea.  She reports that she has been unable to get to the toilet and has soiled herself both overnight and this morning.  This has happened in the past as well.  She reports that her stool was black this morning.  She is not on blood thinners. She feels weak and is worried that she may be dehydrated.  She denies any prior history of GI bleed.  She denies abdominal pain, nausea, vomiting, fever, history of C. Difficile.  Past Medical History:  Diagnosis Date  . Diabetes mellitus without complication (HCC)   . GERD (gastroesophageal reflux disease)   . Hypertension   . Lesion of sciatic nerve, right side   . MI (myocardial infarction) (HCC)   . Stroke [facility])     Patient Active Problem List   Diagnosis Date Noted  . Hypoglycemia 12/10/2017  . 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 NSTEMI (non-ST elevated myocardial infarction) (HCC) 10/01/2016  . 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 Diabetes (HCC) 09/29/2016  . GERD (gastroesophageal reflux disease) 09/29/2016  . Nausea and vomiting 03/24/2016  . Diffuse abdominal pain 03/24/2016  . Hypokalemia 03/24/2016  . Chest pain 03/24/2016  . Elevated troponin I level 09/26/2015  . DKA (diabetic ketoacidoses) (HCC) 09/25/2015    Past Surgical History:  Procedure Laterality Date    . ABDOMINAL HYSTERECTOMY    . BREAST BIOPSY Right    neg  . BREAST SURGERY    . LEFT HEART CATH AND CORONARY ANGIOGRAPHY N/A 10/01/2016   Procedure: Left Heart Cath and Coronary Angiography and possible PCI;  Surgeon: Alwyn Pea, MD;  Location: [facility] INVASIVE CV LAB;  Service: Cardiovascular;  Laterality: N/A;    Prior to Admission medications   Medication Sig Start Date End Date Taking? Authorizing Provider  acetaminophen (TYLENOL) 500 MG tablet Take 1,000-1,500 mg by mouth every 6 (six) hours as needed for mild pain or headache.    [provider]  aspirin EC 81 MG EC tablet Take 1 tablet (81 mg total) by mouth daily. 03/26/16   Katharina Caper, MD  brimonidine-timolol (COMBIGAN) 0.2-0.5 % ophthalmic solution Place 1 drop into both eyes daily.    [provider]  cephALEXin (KEFLEX) 500 MG capsule Take 1 capsule (500 mg total) by mouth 2 (two) times daily. 06/11/18   Governor Rooks, MD  citalopram (CELEXA) 20 MG tablet Take 20 mg by mouth daily.    [provider]  docusate sodium (COLACE) 100 MG capsule Take 1 capsule (100 mg total) by mouth 2 (two) times daily. 10/04/16   Auburn Bilberry, MD  donepezil (ARICEPT) 5 MG tablet Take 5 mg by mouth at bedtime.    [provider]  fluticasone (FLONASE) 50 MCG/ACT nasal spray Place 2 sprays into both nostrils daily.    [provider]  hydrALAZINE (APRESOLINE) 50 MG tablet Take 1 tablet (50 mg total) by  mouth every 8 (eight) hours. 03/12/18   Emily FilbertWilliams, Jonathan E, MD  hydrochlorothiazide (HYDRODIURIL) 12.5 MG tablet Take 1 tablet (12.5 mg total) by mouth daily. 10/03/16   [name], [name], MD  Insulin Glargine (TOUJEO SOLOSTAR) 300 UNIT/ML SOPN Inject 30 Units into the skin at bedtime. 12/11/17   Houston SirenSainani, Vivek J, MD  insulin lispro (HUMALOG) 100 UNIT/ML injection Inject 0.04 mLs (4 Units total) into the skin 3 (three) times daily before meals. Pt uses per sliding scale. 12/11/17   Houston SirenSainani, Vivek J, MD   isosorbide mononitrate (IMDUR) 30 MG 24 hr tablet Take 1 tablet (30 mg total) by mouth daily. 03/26/16   [name], [name], MD  labetalol (NORMODYNE) 100 MG tablet Take 100 mg by mouth 2 (two) times daily.    [provider]  loperamide (IMODIUM A-D) 2 MG tablet Take 1 tablet (2 mg total) by mouth 4 (four) times daily as needed for diarrhea or loose stools. 08/30/18   Nita SickleVeronese, [place], MD  loratadine (CLARITIN) 10 MG tablet Take 10 mg by mouth daily.    [provider]  lovastatin (MEVACOR) 40 MG tablet Take 40 mg by mouth at bedtime.     [provider]  magnesium oxide (MAG-OX) 400 MG tablet Take 400 mg by mouth daily.    [provider]  Multiple Vitamins-Minerals (CENTRUM ADULTS) TABS Take 1 tablet by mouth every morning.     [provider]  omeprazole (PRILOSEC) 20 MG capsule Take 1 capsule (20 mg total) by mouth daily. 09/28/15   Katha HammingKonidena, Snehalatha, MD  ondansetron (ZOFRAN ODT) 4 MG disintegrating tablet Take 1 tablet (4 mg total) by mouth every 8 (eight) hours as needed for nausea or vomiting. 05/13/17   Irean HongSung, Jade J, MD  polyethylene glycol [facility](MIRALAX / Ethelene HalGLYCOLAX) packet Take 17 g by mouth daily. 05/03/15   Emily FilbertWilliams, Jonathan E, MD  quinapril (ACCUPRIL) 20 MG tablet Take 2 tablets (40 mg total) by mouth daily. 10/01/16   [name], [name], MD  traMADol (ULTRAM) 50 MG tablet Take 1 tablet (50 mg total) by mouth every 12 (twelve) hours as needed for moderate pain. 03/12/18 03/12/19  Emily FilbertWilliams, Jonathan E, MD    Allergies Patient has no known allergies.  Family History  Problem Relation Age of Onset  . Heart disease Mother   . Diabetes Mellitus II Father   . Hypertension Sister   . Diabetes Mellitus II Sister   . Breast cancer Sister 3160  . Hypertension Brother   . Diabetes Mellitus II Brother     Social History Social History   Tobacco Use  . Smoking status: Never Smoker  . Smokeless tobacco: Never Used  Substance Use Topics  . Alcohol use: No   . Drug use: No    Review of Systems  Constitutional: Negative for fever. Eyes: Negative for visual changes. ENT: Negative for sore throat. Neck: No neck pain  Cardiovascular: Negative for chest pain. Respiratory: Negative for shortness of breath. Gastrointestinal: Negative for abdominal pain, vomiting. + diarrhea. Genitourinary: Negative for dysuria. Musculoskeletal: Negative for back pain. Skin: Negative for rash. Neurological: Negative for headaches, weakness or numbness. Psych: No SI or HI  ____________________________________________   PHYSICAL EXAM:  VITAL SIGNS: ED Triage Vitals [08/30/18 1455]  Enc Vitals Group     BP (!) 173/77     Pulse Rate 71     Resp 18     Temp 97.9 F (36.6 C)     Temp src      SpO2 100 %  Weight 151 lb (68.5 kg)     Height 5\' 4"  (1.626 m)     Head Circumference      Peak Flow      Pain Score 0     Pain Loc      Pain Edu?      Excl. in GC?     Constitutional: Alert and oriented. Well appearing and in no apparent distress. HEENT:      Head: Normocephalic and atraumatic.         Eyes: Conjunctivae are normal. Sclera is non-icteric.       Mouth/Throat: Mucous membranes are moist.       Neck: Supple with no signs of meningismus. Cardiovascular: Regular rate and rhythm. No murmurs, gallops, or rubs. 2+ symmetrical distal pulses are present in all extremities. No JVD. Respiratory: Normal respiratory effort. Lungs are clear to auscultation bilaterally. No wheezes, crackles, or rhonchi.  Gastrointestinal: Soft, non tender, and non distended with positive bowel sounds. No rebound or guarding. Genitourinary: No CVA tenderness.  Rectal exam showing dark brown stool guaiac negative Musculoskeletal: Nontender with normal range of motion in all extremities. No edema, cyanosis, or erythema of extremities. Neurologic: Normal speech and language. Face is symmetric. Moving all extremities. No gross focal neurologic deficits are  appreciated. Skin: Skin is warm, dry and intact. No rash noted. Psychiatric: Mood and affect are normal. Speech and behavior are normal.  ____________________________________________   LABS (all labs ordered are listed, but only abnormal results are displayed)  Labs Reviewed  COMPREHENSIVE METABOLIC PANEL - Abnormal; Notable for the following components:      Result Value   Potassium 3.3 (*)    All other components within normal limits  URINALYSIS, COMPLETE (UACMP) WITH MICROSCOPIC - Abnormal; Notable for the following components:   Color, Urine STRAW (*)    APPearance CLEAR (*)    Specific Gravity, Urine 1.002 (*)    Hgb urine dipstick SMALL (*)    Leukocytes, UA TRACE (*)    All other components within normal limits  C DIFFICILE QUICK SCREEN W PCR REFLEX  LIPASE, BLOOD  CBC   ____________________________________________  EKG  ED ECG REPORT I, Nita Sickle, the attending physician, personally viewed and interpreted this ECG.  Normal sinus rhythm, rate of 67, normal intervals, normal axis, no ST elevations or depressions, LVH.  Unchanged from prior ____________________________________________  RADIOLOGY  none  ____________________________________________   PROCEDURES  Procedure(s) performed: None Procedures Critical Care performed:  None ____________________________________________   INITIAL IMPRESSION / ASSESSMENT AND PLAN / ED COURSE   78 y.o. female the history of diabetes, GERD, hypertension, CAD, CVA who presents for evaluation of acute on chronic diarrhea.  Rectal exam showing brown stool guaiac negative.  Patient is hemodynamically stable.  Labs showing no significant electrolyte abnormalities, AKI, anemia, leukocytosis.  Ddx c. Diff, colitis, gastroenteritis. We will send stool for C. difficile testing.  Will give IV fluids for hydration.  If C. difficile is negative will start patient on Imodium.  Patient has no abdominal pain or tenderness, no  fever, no nausea or vomiting therefore no imaging indicated at this time.  Clinical Course as of Aug 30 1848  Sun Aug 30, 2018  2836 Patient monitored in the emergency department for several hours with no episodes of diarrhea.  Labs showing no acute findings.  Patient received IV fluids with resolution of her generalized weakness.  Will discharge home on Imodium and follow-up with primary care doctor.  Discussed and return precautions   [  CV]    Clinical Course User Index [CV] Don PerkingVeronese, [place], MD   _________________________ 6:50 PM on 08/30/2018 -----------------------------------------  BP elevated, patient did not take home meds. She is asymptomatic. Home meds given before dc/  As part of my medical decision making, I reviewed the following data within the electronic MEDICAL RECORD NUMBER Nursing notes reviewed and incorporated, Labs reviewed , Old chart reviewed, Notes from prior ED visits and [place] Controlled Substance Database    Pertinent labs & imaging results that were available during my care of the patient were reviewed by me and considered in my medical decision making (see chart for details).    ____________________________________________   FINAL CLINICAL IMPRESSION(S) / ED DIAGNOSES  Final diagnoses:  Functional diarrhea  Dehydration      NEW MEDICATIONS STARTED DURING THIS VISIT:  ED Discharge Orders         Ordered    loperamide (IMODIUM A-D) 2 MG tablet  4 times daily PRN     08/30/18 1849           Note:  This document was prepared using Dragon voice recognition software and may include unintentional dictation errors.    Don PerkingVeronese, [place], MD 08/30/18 1850    Don PerkingVeronese, [place], MD 08/30/18 [phone]

## 2018-08-30 NOTE — ED Notes (Signed)
Pt up to the bedside commode to given urine specimen.

## 2018-08-30 NOTE — ED Triage Notes (Addendum)
Pt comes via POV from home with c/o diarrhea. Pt states this am she woke up and had messed herself. Pt states she started feeling weak last night.  Pt states it is really dark. Pt also states nausea.  Pt states high BP but has not taken BP medication today.

## 2018-08-30 NOTE — ED Notes (Signed)
Pt to the er for watery diarrhea that is dark in nature. Pt states she has episodes at night where she will have a BM while in bed. Pt says she has discussed with her MD but says she has not been given an answer. Pt says she has not been eating much as her blood sugar has been running high even though she is compliant with medications. Pt is ambulatory.

## 2018-08-30 NOTE — ED Notes (Signed)
Pt up to the bedside commode 

## 2018-09-07 ENCOUNTER — Other Ambulatory Visit: Payer: Self-pay

## 2018-09-07 ENCOUNTER — Emergency Department
Admission: EM | Admit: 2018-09-07 | Discharge: 2018-09-07 | Disposition: A | Discharge status: Home / Self Care | Payer: Medicare Other | Attending: Emergency Medicine | Admitting: Emergency Medicine

## 2018-09-07 ENCOUNTER — Encounter: Payer: Self-pay | Admitting: *Deleted

## 2018-09-07 DIAGNOSIS — Z7982 Long term (current) use of aspirin: Secondary | ICD-10-CM | POA: Diagnosis not present

## 2018-09-07 DIAGNOSIS — Z794 Long term (current) use of insulin: Secondary | ICD-10-CM | POA: Diagnosis not present

## 2018-09-07 DIAGNOSIS — H538 Other visual disturbances: Secondary | ICD-10-CM | POA: Insufficient documentation

## 2018-09-07 DIAGNOSIS — E119 Type 2 diabetes mellitus without complications: Secondary | ICD-10-CM | POA: Insufficient documentation

## 2018-09-07 DIAGNOSIS — I10 Essential (primary) hypertension: Secondary | ICD-10-CM | POA: Insufficient documentation

## 2018-09-07 DIAGNOSIS — Z79899 Other long term (current) drug therapy: Secondary | ICD-10-CM | POA: Diagnosis not present

## 2018-09-07 LAB — BASIC METABOLIC PANEL
Anion gap: 5 (ref 5–15)
BUN: 17 mg/dL (ref 8–23)
CALCIUM: 8.6 mg/dL — AB (ref 8.9–10.3)
CO2: 28 mmol/L (ref 22–32)
Chloride: 105 mmol/L (ref 98–111)
Creatinine, Ser: 1.21 mg/dL — ABNORMAL HIGH (ref 0.44–1.00)
GFR calc Af Amer: 49 mL/min — ABNORMAL LOW (ref >60)
GFR calc non Af Amer: 43 mL/min — ABNORMAL LOW (ref >60)
Glucose, Bld: 80 mg/dL (ref 70–99)
Potassium: 4 mmol/L (ref 3.5–5.1)
Sodium: 138 mmol/L (ref 135–145)

## 2018-09-07 LAB — CBC
HCT: 35.3 % — ABNORMAL LOW (ref 36.0–46.0)
Hemoglobin: 11.5 g/dL — ABNORMAL LOW (ref 12.0–15.0)
MCH: 30.3 pg (ref 26.0–34.0)
MCHC: 32.6 g/dL (ref 30.0–36.0)
MCV: 92.9 fL (ref 80.0–100.0)
Platelets: 282 10*3/uL (ref 150–400)
RBC: 3.8 MIL/uL — AB (ref 3.87–5.11)
RDW: 12.5 % (ref 11.5–15.5)
WBC: 7.9 10*3/uL (ref 4.0–10.5)
nRBC: 0 % (ref 0.0–0.2)

## 2018-09-07 LAB — GLUCOSE, CAPILLARY: Glucose-Capillary: 74 mg/dL (ref 70–99)

## 2018-09-07 LAB — TROPONIN I: Troponin I: <0.03 ng/mL (ref <0.03)

## 2018-09-07 NOTE — ED Provider Notes (Signed)
[facility] Emergency Department Provider Note  ____________________________________________   First MD Initiated Contact with Patient 09/07/18 1906     (approximate)  I have reviewed the triage vital signs and the nursing notes.   HISTORY  Chief Complaint Eye Problem   HPI Vicki [name] is a 78 y.o. female with a history of diabetes, GERD and hypertension as well as stroke and MI who was presented emergency department bilaterally blurred vision.  She states that she thinks she took an extra "blood pressure medication" this afternoon and thereafter started having blurred vision which lasted for approximately half an hour.  She says that she was able to see things a close distance but was not able to see things far away.  She says that the visual disturbance was to both eyes and was to her entire visual field.  She denies any slurred speech, weakness or numbness during this time.  Says that her previous stroke was marked by confusion but she does not describe any visual disturbance.  Patient says that she also thinks that her low blood pressure of "70" could have contributed as well.  She says that she ate after seeing her blood pressure was low and her symptom started to improve around the time as well.  At this time she is asymptomatic without any complaints.   Past Medical History:  Diagnosis Date  . Diabetes mellitus without complication (HCC)   . GERD (gastroesophageal reflux disease)   . Hypertension   . Lesion of sciatic nerve, right side   . MI (myocardial infarction) (HCC)   . Stroke [facility])     Patient Active Problem List   Diagnosis Date Noted  . Hypoglycemia 12/10/2017  . 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 NSTEMI (non-ST elevated myocardial infarction) (HCC) 10/01/2016  . 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 Diabetes (HCC) 09/29/2016  . GERD  (gastroesophageal reflux disease) 09/29/2016  . Nausea and vomiting 03/24/2016  . Diffuse abdominal pain 03/24/2016  . Hypokalemia 03/24/2016  . Chest pain 03/24/2016  . Elevated troponin I level 09/26/2015  . DKA (diabetic ketoacidoses) (HCC) 09/25/2015    Past Surgical History:  Procedure Laterality Date  . ABDOMINAL HYSTERECTOMY    . BREAST BIOPSY Right    neg  . BREAST SURGERY    . LEFT HEART CATH AND CORONARY ANGIOGRAPHY N/A 10/01/2016   Procedure: Left Heart Cath and Coronary Angiography and possible PCI;  Surgeon: Alwyn Pea, MD;  Location: [facility] INVASIVE CV LAB;  Service: Cardiovascular;  Laterality: N/A;    Prior to Admission medications   Medication Sig Start Date End Date Taking? Authorizing Provider  acetaminophen (TYLENOL) 500 MG tablet Take 1,000-1,500 mg by mouth every 6 (six) hours as needed for mild pain or headache.    [provider]  aspirin EC 81 MG EC tablet Take 1 tablet (81 mg total) by mouth daily. 03/26/16   Katharina Caper, MD  brimonidine-timolol (COMBIGAN) 0.2-0.5 % ophthalmic solution Place 1 drop into both eyes daily.    [provider]  cephALEXin (KEFLEX) 500 MG capsule Take 1 capsule (500 mg total) by mouth 2 (two) times daily. 06/11/18   Governor Rooks, MD  citalopram (CELEXA) 20 MG tablet Take 20 mg by mouth daily.    [provider]  docusate sodium (COLACE) 100 MG capsule Take 1 capsule (100 mg total) by mouth 2 (two) times daily. 10/04/16   Auburn Bilberry,  MD  donepezil (ARICEPT) 5 MG tablet Take 5 mg by mouth at bedtime.    [provider]  fluticasone (FLONASE) 50 MCG/ACT nasal spray Place 2 sprays into both nostrils daily.    [provider]  hydrALAZINE (APRESOLINE) 50 MG tablet Take 1 tablet (50 mg total) by mouth every 8 (eight) hours. 03/12/18   Emily FilbertWilliams, Jonathan E, MD  hydrochlorothiazide (HYDRODIURIL) 12.5 MG tablet Take 1 tablet (12.5 mg total) by mouth daily. 10/03/16   [name], [name], MD    Insulin Glargine (TOUJEO SOLOSTAR) 300 UNIT/ML SOPN Inject 30 Units into the skin at bedtime. 12/11/17   Houston SirenSainani, Vivek J, MD  insulin lispro (HUMALOG) 100 UNIT/ML injection Inject 0.04 mLs (4 Units total) into the skin 3 (three) times daily before meals. Pt uses per sliding scale. 12/11/17   Houston SirenSainani, Vivek J, MD  isosorbide mononitrate (IMDUR) 30 MG 24 hr tablet Take 1 tablet (30 mg total) by mouth daily. 03/26/16   [name], [name], MD  labetalol (NORMODYNE) 100 MG tablet Take 100 mg by mouth 2 (two) times daily.    [provider]  loperamide (IMODIUM A-D) 2 MG tablet Take 1 tablet (2 mg total) by mouth 4 (four) times daily as needed for diarrhea or loose stools. 08/30/18   Nita SickleVeronese, Sciota, MD  loratadine (CLARITIN) 10 MG tablet Take 10 mg by mouth daily.    [provider]  lovastatin (MEVACOR) 40 MG tablet Take 40 mg by mouth at bedtime.     [provider]  magnesium oxide (MAG-OX) 400 MG tablet Take 400 mg by mouth daily.    [provider]  Multiple Vitamins-Minerals (CENTRUM ADULTS) TABS Take 1 tablet by mouth every morning.     [provider]  omeprazole (PRILOSEC) 20 MG capsule Take 1 capsule (20 mg total) by mouth daily. 09/28/15   Katha HammingKonidena, Snehalatha, MD  ondansetron (ZOFRAN ODT) 4 MG disintegrating tablet Take 1 tablet (4 mg total) by mouth every 8 (eight) hours as needed for nausea or vomiting. 05/13/17   Irean HongSung, Jade J, MD  polyethylene glycol [facility](MIRALAX / Ethelene HalGLYCOLAX) packet Take 17 g by mouth daily. 05/03/15   Emily FilbertWilliams, Jonathan E, MD  quinapril (ACCUPRIL) 20 MG tablet Take 2 tablets (40 mg total) by mouth daily. 10/01/16   [name], [name], MD  traMADol (ULTRAM) 50 MG tablet Take 1 tablet (50 mg total) by mouth every 12 (twelve) hours as needed for moderate pain. 03/12/18 03/12/19  Emily FilbertWilliams, Jonathan E, MD    Allergies Patient has no known allergies.  Family History  Problem Relation Age of Onset  . Heart disease Mother   . Diabetes Mellitus II  Father   . Hypertension Sister   . Diabetes Mellitus II Sister   . Breast cancer Sister [id number]  . Hypertension Brother   . Diabetes Mellitus II Brother     Social History Social History   Tobacco Use  . Smoking status: Never Smoker  . Smokeless tobacco: Never Used  Substance Use Topics  . Alcohol use: No  . Drug use: No    Review of Systems  Constitutional: No fever/chills Eyes: As above ENT: No sore throat. Cardiovascular: Denies chest pain. Respiratory: Denies shortness of breath. Gastrointestinal: No abdominal pain.  No nausea, no vomiting.  No diarrhea.  No constipation. Genitourinary: Negative for dysuria. Musculoskeletal: Negative for back pain. Skin: Negative for rash. Neurological: Negative for headaches, focal weakness or numbness.   ____________________________________________   PHYSICAL EXAM:  VITAL SIGNS: ED Triage Vitals  Enc  Vitals Group     BP 09/07/18 1841 (!) 114/48     Pulse Rate 09/07/18 1841 65     Resp 09/07/18 1841 18     Temp 09/07/18 1841 98.4 F (36.9 C)     Temp Source 09/07/18 1841 Oral     SpO2 09/07/18 1841 99 %     Weight 09/07/18 1842 151 lb (68.5 kg)     Height 09/07/18 1842 5\' 4"  (1.626 m)     Head Circumference --      Peak Flow --      Pain Score 09/07/18 1840 0     Pain Loc --      Pain Edu? --      Excl. in GC? --     Constitutional: Alert and oriented. Well appearing and in no acute distress. Eyes: Conjunctivae are normal.  Head: Atraumatic. Nose: No congestion/rhinnorhea. Mouth/Throat: Mucous membranes are moist.  Neck: No stridor.   Cardiovascular: Normal rate, regular rhythm. Grossly normal heart sounds.   Respiratory: Normal respiratory effort.  No retractions. Lungs CTAB. Gastrointestinal: Soft and nontender. No distention. Musculoskeletal: No lower extremity tenderness nor edema.  No joint effusions. Neurologic:  Normal speech and language. No gross focal neurologic deficits are appreciated. Skin:  Skin is  warm, dry and intact. No rash noted. Psychiatric: Mood and affect are normal. Speech and behavior are normal.  ____________________________________________   LABS (all labs ordered are listed, but only abnormal results are displayed)  Labs Reviewed  BASIC METABOLIC PANEL - Abnormal; Notable for the following components:      Result Value   Creatinine, Ser 1.21 (*)    Calcium 8.6 (*)    GFR calc non Af Amer 43 (*)    GFR calc Af Amer 49 (*)    All other components within normal limits  CBC - Abnormal; Notable for the following components:   RBC 3.80 (*)    Hemoglobin 11.5 (*)    HCT 35.3 (*)    All other components within normal limits  TROPONIN I  GLUCOSE, CAPILLARY  CBG MONITORING, ED   ____________________________________________  EKG  ED ECG REPORT I, Arelia Longest, the attending physician, personally viewed and interpreted this ECG.   Date: 09/07/2018  EKG Time: 1853  Rate: 62  Rhythm: normal sinus rhythm  Axis: Normal  Intervals:none  ST&T Change: No ST segment elevation or depression.  No abnormal T wave inversion.  ____________________________________________  RADIOLOGY   ____________________________________________   PROCEDURES  Procedure(s) performed:   Procedures  Critical Care performed:   ____________________________________________   INITIAL IMPRESSION / ASSESSMENT AND PLAN / ED COURSE  Pertinent labs & imaging results that were available during my care of the patient were reviewed by me and considered in my medical decision making (see chart for details).  Differential diagnosis includes, but is not limited to, alcohol, illicit or prescription medications, or other toxic ingestion; intracranial pathology such as stroke or intracerebral hemorrhage; fever or infectious causes including sepsis; hypoxemia and/or hypercarbia; uremia; trauma; endocrine related disorders such as diabetes, hypoglycemia, and thyroid-related diseases;  hypertensive encephalopathy; etc. As part of my medical decision making, I reviewed the following data within the electronic MEDICAL RECORD NUMBER Notes from prior ED visits  ----------------------------------------- 8:58 PM on 09/07/2018 -----------------------------------------  Patient at this time continues to be without any acute complaints and at her baseline neuro status.  Says that her vision is completely normal at this time.  No focal weakness or numbness.  We discussed  possible CAT scan because of history of stroke and MI.  However, the patient says that she would rather be discharged home and thinks it is likely related to her taking extra medication.  Feel like it is less likely to be CVA because of the bilateral symptoms as well as no associated other neurologic symptoms such as numbness weakness or shortness of breath.  Patient knows to return immediately for any worsening or concerning symptoms.  She is understanding of the diagnosis well treatment and willing to comply. ____________________________________________   FINAL CLINICAL IMPRESSION(S) / ED DIAGNOSES  Blurred vision.  NEW MEDICATIONS STARTED DURING THIS VISIT:  New Prescriptions   No medications on file     Note:  This document was prepared using Dragon voice recognition software and may include unintentional dictation errors.     Myrna BlazerSchaevitz,  Matthew, MD 09/07/18 2059

## 2018-09-07 NOTE — ED Notes (Signed)
Pt used glasses while completing visual acuity screening. LEFT eye 20/40 RIGHT eye 20/50.

## 2018-09-07 NOTE — ED Triage Notes (Signed)
Pt reports blurred vision in both eyes. .  No headache.  No slurred speech.  Pt states she may have double dosed on her  bp meds today.  Sx began at 1700 today.  Pt denies any pain.

## 2018-09-07 NOTE — ED Notes (Signed)
fsbs 74

## 2018-09-16 ENCOUNTER — Other Ambulatory Visit: Payer: Self-pay | Admitting: Neurology

## 2018-09-16 ENCOUNTER — Other Ambulatory Visit: Payer: Self-pay | Admitting: Internal Medicine

## 2018-09-16 DIAGNOSIS — R413 Other amnesia: Secondary | ICD-10-CM

## 2018-09-25 ENCOUNTER — Emergency Department: Payer: Medicare Other

## 2018-09-25 ENCOUNTER — Emergency Department
Admission: EM | Admit: 2018-09-25 | Discharge: 2018-09-25 | Disposition: A | Discharge status: Home / Self Care | Payer: Medicare Other | Attending: Emergency Medicine | Admitting: Emergency Medicine

## 2018-09-25 ENCOUNTER — Other Ambulatory Visit: Payer: Self-pay

## 2018-09-25 ENCOUNTER — Encounter: Payer: Self-pay | Admitting: Emergency Medicine

## 2018-09-25 DIAGNOSIS — T466X5A Adverse effect of antihyperlipidemic and antiarteriosclerotic drugs, initial encounter: Secondary | ICD-10-CM | POA: Insufficient documentation

## 2018-09-25 DIAGNOSIS — M62838 Other muscle spasm: Secondary | ICD-10-CM

## 2018-09-25 DIAGNOSIS — Z79899 Other long term (current) drug therapy: Secondary | ICD-10-CM | POA: Diagnosis not present

## 2018-09-25 DIAGNOSIS — Z7982 Long term (current) use of aspirin: Secondary | ICD-10-CM | POA: Insufficient documentation

## 2018-09-25 DIAGNOSIS — G720 Drug-induced myopathy: Secondary | ICD-10-CM | POA: Diagnosis not present

## 2018-09-25 DIAGNOSIS — M7918 Myalgia, other site: Secondary | ICD-10-CM | POA: Diagnosis present

## 2018-09-25 LAB — CBC
HCT: 38.4 % (ref 36.0–46.0)
Hemoglobin: 12.7 g/dL (ref 12.0–15.0)
MCH: 30.2 pg (ref 26.0–34.0)
MCHC: 33.1 g/dL (ref 30.0–36.0)
MCV: 91.4 fL (ref 80.0–100.0)
Platelets: 280 10*3/uL (ref 150–400)
RBC: 4.2 MIL/uL (ref 3.87–5.11)
RDW: 12.1 % (ref 11.5–15.5)
WBC: 6.8 10*3/uL (ref 4.0–10.5)
nRBC: 0 % (ref 0.0–0.2)

## 2018-09-25 LAB — BASIC METABOLIC PANEL
Anion gap: 9 (ref 5–15)
BUN: 19 mg/dL (ref 8–23)
CO2: 28 mmol/L (ref 22–32)
CREATININE: 0.96 mg/dL (ref 0.44–1.00)
Calcium: 9.7 mg/dL (ref 8.9–10.3)
Chloride: 102 mmol/L (ref 98–111)
GFR calc Af Amer: >60 mL/min (ref >60)
GFR calc non Af Amer: 56 mL/min — ABNORMAL LOW (ref >60)
Glucose, Bld: 195 mg/dL — ABNORMAL HIGH (ref 70–99)
Potassium: 4 mmol/L (ref 3.5–5.1)
Sodium: 139 mmol/L (ref 135–145)

## 2018-09-25 LAB — CK: Total CK: 409 U/L — ABNORMAL HIGH (ref 38–234)

## 2018-09-25 LAB — MAGNESIUM: Magnesium: 1.7 mg/dL (ref 1.7–2.4)

## 2018-09-25 LAB — TROPONIN I: Troponin I: <0.03 ng/mL (ref <0.03)

## 2018-09-25 MED ORDER — ACETAMINOPHEN 500 MG PO TABS
1000.0000 mg | ORAL_TABLET | ORAL | Status: AC
  Administered 2018-09-25: 1000 mg via ORAL
  Filled 2018-09-25: qty 2

## 2018-09-25 MED ORDER — SODIUM CHLORIDE 0.9% FLUSH: 3.0000 mL | Freq: Once | INTRAVENOUS | Status: DC

## 2018-09-25 NOTE — ED Provider Notes (Signed)
[facility] Emergency Department Provider Note   ____________________________________________   First MD Initiated Contact with Patient 09/25/18 [phone]     (approximate)  I have reviewed the triage vital signs and the nursing notes.   HISTORY  Chief Complaint Leg Pain    HPI Vicki [name] is a 78 y.o. female here for evaluation of muscle aches  Patient reports that she went to bed last night she had cramps in both of her calves, she also started feeling a crampy feeling in right upper shoulder region this morning.  The cramps in the legs went away and her shoulder starting to feel better at this point.  Reports this happens from time to time and usually she takes Tylenol for it and it relieves it, denies taking any new medications.  She denies any fever chills or muscle aches other than the cramps that she experienced.  No nausea or vomiting.  No headache.  No chest pain or shortness of breath  No leg swelling.  Ports that she has made Tylenol since last night but it was helpful.  This happened many times in the past, reports usually she gets better after taking Tylenol tablet.  No numbness tingling or weakness.  No trouble speaking.  No headache.   Past Medical History:  Diagnosis Date  . Diabetes mellitus without complication (HCC)   . GERD (gastroesophageal reflux disease)   . Hypertension   . Lesion of sciatic nerve, right side   . MI (myocardial infarction) (HCC)   . Stroke [facility])     Patient Active Problem List   Diagnosis Date Noted  . Hypoglycemia 12/10/2017  . 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 NSTEMI (non-ST elevated myocardial infarction) (HCC) 10/01/2016  . 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 Diabetes (HCC) 09/29/2016  . GERD (gastroesophageal reflux disease) 09/29/2016  . Nausea and vomiting 03/24/2016  . Diffuse abdominal pain 03/24/2016  .  Hypokalemia 03/24/2016  . Chest pain 03/24/2016  . Elevated troponin I level 09/26/2015  . DKA (diabetic ketoacidoses) (HCC) 09/25/2015    Past Surgical History:  Procedure Laterality Date  . ABDOMINAL HYSTERECTOMY    . BREAST BIOPSY Right    neg  . BREAST SURGERY    . LEFT HEART CATH AND CORONARY ANGIOGRAPHY N/A 10/01/2016   Procedure: Left Heart Cath and Coronary Angiography and possible PCI;  Surgeon: Alwyn Pea, MD;  Location: [facility] INVASIVE CV LAB;  Service: Cardiovascular;  Laterality: N/A;    Prior to Admission medications   Medication Sig Start Date End Date Taking? Authorizing Provider  acetaminophen (TYLENOL) 500 MG tablet Take 1,000-1,500 mg by mouth every 6 (six) hours as needed for mild pain or headache.    [provider]  aspirin EC 81 MG EC tablet Take 1 tablet (81 mg total) by mouth daily. 03/26/16   Katharina Caper, MD  brimonidine-timolol (COMBIGAN) 0.2-0.5 % ophthalmic solution Place 1 drop into both eyes daily.    [provider]  cephALEXin (KEFLEX) 500 MG capsule Take 1 capsule (500 mg total) by mouth 2 (two) times daily. 06/11/18   Governor Rooks, MD  citalopram (CELEXA) 20 MG tablet Take 20 mg by mouth daily.    [provider]  docusate sodium (COLACE) 100 MG capsule Take 1 capsule (100 mg total) by mouth 2 (two) times daily. 10/04/16   Auburn Bilberry, MD  donepezil (ARICEPT) 5 MG tablet Take 5 mg by  mouth at bedtime.    [provider]  fluticasone (FLONASE) 50 MCG/ACT nasal spray Place 2 sprays into both nostrils daily.    [provider]  hydrALAZINE (APRESOLINE) 50 MG tablet Take 1 tablet (50 mg total) by mouth every 8 (eight) hours. 03/12/18   [name], MD  hydrochlorothiazide (HYDRODIURIL) 12.5 MG tablet Take 1 tablet (12.5 mg total) by mouth daily. 10/03/16   Auburn Bilberry, MD  Insulin Glargine (TOUJEO SOLOSTAR) 300 UNIT/ML SOPN Inject 30 Units into the skin at bedtime. 12/11/17   Houston Siren, MD    insulin lispro (HUMALOG) 100 UNIT/ML injection Inject 0.04 mLs (4 Units total) into the skin 3 (three) times daily before meals. Pt uses per sliding scale. 12/11/17   Houston Siren, MD  isosorbide mononitrate (IMDUR) 30 MG 24 hr tablet Take 1 tablet (30 mg total) by mouth daily. 03/26/16   Katharina Caper, MD  labetalol (NORMODYNE) 100 MG tablet Take 100 mg by mouth 2 (two) times daily.    [provider]  loperamide (IMODIUM A-D) 2 MG tablet Take 1 tablet (2 mg total) by mouth 4 (four) times daily as needed for diarrhea or loose stools. 08/30/18   Nita Sickle, MD  loratadine (CLARITIN) 10 MG tablet Take 10 mg by mouth daily.    [provider]  lovastatin (MEVACOR) 40 MG tablet Take 40 mg by mouth at bedtime.     [provider]  magnesium oxide (MAG-OX) 400 MG tablet Take 400 mg by mouth daily.    [provider]  Multiple Vitamins-Minerals (CENTRUM ADULTS) TABS Take 1 tablet by mouth every morning.     [provider]  omeprazole (PRILOSEC) 20 MG capsule Take 1 capsule (20 mg total) by mouth daily. 09/28/15   Katha Hamming, MD  ondansetron (ZOFRAN ODT) 4 MG disintegrating tablet Take 1 tablet (4 mg total) by mouth every 8 (eight) hours as needed for nausea or vomiting. 05/13/17   Irean Hong, MD  polyethylene glycol [facility] / Ethelene Hal) packet Take 17 g by mouth daily. 05/03/15   [name], MD  quinapril (ACCUPRIL) 20 MG tablet Take 2 tablets (40 mg total) by mouth daily. 10/01/16   Auburn Bilberry, MD  traMADol (ULTRAM) 50 MG tablet Take 1 tablet (50 mg total) by mouth every 12 (twelve) hours as needed for moderate pain. 03/12/18 03/12/19  [name], MD    Allergies Patient has no known allergies.  Family History  Problem Relation Age of Onset  . Heart disease Mother   . Diabetes Mellitus II Father   . Hypertension Sister   . Diabetes Mellitus II Sister   . Breast cancer Sister 27  . Hypertension Brother   .  Diabetes Mellitus II Brother     Social History Social History   Tobacco Use  . Smoking status: Never Smoker  . Smokeless tobacco: Never Used  Substance Use Topics  . Alcohol use: No  . Drug use: No    Review of Systems Constitutional: No fever/chills Eyes: No visual changes. ENT: No sore throat. Cardiovascular: Denies chest pain. Respiratory: Denies shortness of breath. Gastrointestinal: No abdominal pain.   Genitourinary: Negative for dysuria. Musculoskeletal: Negative for back pain.  See HPI.  No falls or injuries.  She reports she is able to use her arms and legs well, the right shoulder is just a little bit "sore" through the range of motion. Skin: Negative for rash. Neurological: Negative for headaches, areas of focal weakness  or numbness.    ____________________________________________   PHYSICAL EXAM:  VITAL SIGNS: ED Triage Vitals  Enc Vitals Group     BP 09/25/18 0857 (!) 147/69     Pulse Rate 09/25/18 0857 71     Resp 09/25/18 0857 17     Temp 09/25/18 0857 98.4 F (36.9 C)     Temp Source 09/25/18 0857 Oral     SpO2 09/25/18 0857 98 %     Weight 09/25/18 0902 151 lb (68.5 kg)     Height 09/25/18 0902 5\' 4"  (1.626 m)     Head Circumference --      Peak Flow --      Pain Score 09/25/18 0902 8     Pain Loc --      Pain Edu? --      Excl. in GC? --     Constitutional: Alert and oriented. Well appearing and in no acute distress.  He is very pleasant.  Resting comfortably. Eyes: Conjunctivae are normal. Head: Atraumatic. Nose: No congestion/rhinnorhea. Mouth/Throat: Mucous membranes are moist. Neck: No stridor.  Cardiovascular: Normal rate, regular rhythm. Grossly normal heart sounds.  Good peripheral circulation. Respiratory: Normal respiratory effort.  No retractions. Lungs CTAB. Gastrointestinal: Soft and nontender. No distention. Musculoskeletal: No lower extremity tenderness nor edema.  No tenderness to palpation of any of the extremities  except a little bit of discomfort over the deltoid of the right shoulder without any bruising or lesion.  Full range of motion of all extremities with normal strength. Neurologic:  Normal speech and language. No gross focal neurologic deficits are appreciated.  Skin:  Skin is warm, dry and intact. No rash noted. Psychiatric: Mood and affect are normal. Speech and behavior are normal.  ____________________________________________   LABS (all labs ordered are listed, but only abnormal results are displayed)  Labs Reviewed  BASIC METABOLIC PANEL - Abnormal; Notable for the following components:      Result Value   Glucose, Bld 195 (*)    GFR calc non Af Amer 56 (*)    All other components within normal limits  CK - Abnormal; Notable for the following components:   Total CK 409 (*)    All other components within normal limits  CBC  TROPONIN I  MAGNESIUM   ____________________________________________  EKG  Reviewed interpreted at 910 Heart rate 65 QRS 89 PR 140 QTc 450 Normal sinus rhythm, left ventricular hypertrophy, old septal MI, no change from February 3 EKG. no evidence acute ischemia ____________________________________________  RADIOLOGY  Dg Chest 2 View  Result Date: 09/25/2018 CLINICAL DATA:  RIGHT arm pain, BILATERAL leg pain, history MI, hypertension, diabetes mellitus EXAM: CHEST - 2 VIEW COMPARISON:  03/11/2018 FINDINGS: Borderline enlargement of cardiac silhouette. Mediastinal contours and pulmonary vascularity normal. Minimal atherosclerotic calcification aorta. Lungs clear. No pulmonary infiltrate, pleural effusion, or pneumothorax. Bones demineralized. Surgical clips RIGHT upper quadrant consistent with history of prior cholecystectomy. IMPRESSION: No acute abnormalities. Electronically Signed   By: Ulyses SouthwardMark  Boles M.D.   On: 09/25/2018 09:52    Chest x-ray reviewed is negative ____________________________________________   PROCEDURES  Procedure(s) performed:  None  Procedures  Critical Care performed: No  ____________________________________________   INITIAL IMPRESSION / ASSESSMENT AND PLAN / ED COURSE  Pertinent labs & imaging results that were available during my care of the patient were reviewed by me and considered in my medical decision making (see chart for details).   Patient returns for evaluation of cramps.  Reporting she is feeling somewhat  better but still having some residual discomfort in her right shoulder region.  Unclear as to cause, she reports staying well-hydrated, she is afebrile, no associated systemic symptoms.  Very reassuring clinical examination.  No evidence that would suggest to be related to pulmonary etiology, ACS, major electrolyte abnormality or otherwise.  Very reassuring exam, will give Tylenol here, add magnesium and CK to labs and plan to reevaluate thereafter.     ----------------------------------------- 11:24 AM on 09/25/2018 -----------------------------------------  Patient feels well after Tylenol.  Resting calmly without discomfort.  CK is mildly elevated and given review of her medications this could be suspicious for statin induced.  We identified the medication lovastatin, counseled patient he will stop use of it continue to monitor her symptoms and follow closely with Dr. Graciela Husbands.  Return precautions and treatment recommendations and follow-up discussed with the patient who is agreeable with the plan.  ____________________________________________   FINAL CLINICAL IMPRESSION(S) / ED DIAGNOSES  Final diagnoses:  Statin myopathy  Muscle spasm        Note:  This document was prepared using Dragon voice recognition software and may include unintentional dictation errors       Sharyn Creamer, MD 09/25/18 1124

## 2018-09-25 NOTE — ED Notes (Signed)
Taken to Xray.

## 2018-09-25 NOTE — Discharge Instructions (Addendum)
STOP use of lavastatin (I suspect this may causing some of your muscles cramps and aches.)  Follow-up with Dr. Graciela Husbands.  Return to the emergency room if you develop a fever, worsening pain, difficulty breathing, swelling, chest pain, confusion, or other new concerns or symptoms arise.

## 2018-09-25 NOTE — ED Triage Notes (Signed)
States both legs were hurting last night and then today right arm hurts as well.  AAOx3.  Skin warm and dry. MAE equally and strong.  Gait steady.  Speech clear.  Facial movements equal.

## 2018-09-26 ENCOUNTER — Ambulatory Visit: Payer: Medicare Other

## 2018-10-02 ENCOUNTER — Ambulatory Visit
Admission: RE | Admit: 2018-10-02 | Discharge: 2018-10-02 | Disposition: A | Discharge status: Home / Self Care | Payer: Medicare Other | Source: Ambulatory Visit | Attending: Neurology | Admitting: Neurology

## 2018-10-02 DIAGNOSIS — R413 Other amnesia: Secondary | ICD-10-CM

## 2018-10-29 ENCOUNTER — Other Ambulatory Visit: Payer: Self-pay

## 2018-10-29 ENCOUNTER — Emergency Department
Admission: EM | Admit: 2018-10-29 | Discharge: 2018-10-29 | Disposition: A | Discharge status: Home / Self Care | Payer: Medicare Other | Attending: Emergency Medicine | Admitting: Emergency Medicine

## 2018-10-29 ENCOUNTER — Encounter: Payer: Self-pay | Admitting: Emergency Medicine

## 2018-10-29 DIAGNOSIS — E162 Hypoglycemia, unspecified: Secondary | ICD-10-CM | POA: Insufficient documentation

## 2018-10-29 DIAGNOSIS — I252 Old myocardial infarction: Secondary | ICD-10-CM | POA: Diagnosis not present

## 2018-10-29 DIAGNOSIS — F0390 Unspecified dementia without behavioral disturbance: Secondary | ICD-10-CM | POA: Insufficient documentation

## 2018-10-29 DIAGNOSIS — Z794 Long term (current) use of insulin: Secondary | ICD-10-CM | POA: Insufficient documentation

## 2018-10-29 DIAGNOSIS — E119 Type 2 diabetes mellitus without complications: Secondary | ICD-10-CM | POA: Diagnosis not present

## 2018-10-29 DIAGNOSIS — I10 Essential (primary) hypertension: Secondary | ICD-10-CM | POA: Diagnosis not present

## 2018-10-29 DIAGNOSIS — Z79899 Other long term (current) drug therapy: Secondary | ICD-10-CM | POA: Diagnosis not present

## 2018-10-29 DIAGNOSIS — H538 Other visual disturbances: Secondary | ICD-10-CM | POA: Insufficient documentation

## 2018-10-29 LAB — URINALYSIS, COMPLETE (UACMP) WITH MICROSCOPIC
Bacteria, UA: NONE SEEN
Bilirubin Urine: NEGATIVE
GLUCOSE, UA: NEGATIVE mg/dL
Hgb urine dipstick: NEGATIVE
Ketones, ur: NEGATIVE mg/dL
LEUKOCYTE UA: NEGATIVE
Nitrite: NEGATIVE
Protein, ur: NEGATIVE mg/dL
Specific Gravity, Urine: 1.004 — ABNORMAL LOW (ref 1.005–1.030)
Squamous Epithelial / HPF: NONE SEEN (ref 0–5)
pH: 5 (ref 5.0–8.0)

## 2018-10-29 LAB — COMPREHENSIVE METABOLIC PANEL
ALT: 20 U/L (ref 0–44)
AST: 36 U/L (ref 15–41)
Albumin: 3.5 g/dL (ref 3.5–5.0)
Alkaline Phosphatase: 46 U/L (ref 38–126)
Anion gap: 8 (ref 5–15)
BUN: 15 mg/dL (ref 8–23)
CO2: 25 mmol/L (ref 22–32)
CREATININE: 0.73 mg/dL (ref 0.44–1.00)
Calcium: 8.6 mg/dL — ABNORMAL LOW (ref 8.9–10.3)
Chloride: 103 mmol/L (ref 98–111)
GFR calc Af Amer: >60 mL/min (ref >60)
GFR calc non Af Amer: >60 mL/min (ref >60)
Glucose, Bld: 142 mg/dL — ABNORMAL HIGH (ref 70–99)
Potassium: 4.1 mmol/L (ref 3.5–5.1)
Sodium: 136 mmol/L (ref 135–145)
Total Bilirubin: 0.9 mg/dL (ref 0.3–1.2)
Total Protein: 6.6 g/dL (ref 6.5–8.1)

## 2018-10-29 LAB — CBC WITH DIFFERENTIAL/PLATELET
Abs Immature Granulocytes: 0.04 10*3/uL (ref 0.00–0.07)
Basophils Absolute: 0.1 10*3/uL (ref 0.0–0.1)
Basophils Relative: 1 %
Eosinophils Absolute: 0.3 10*3/uL (ref 0.0–0.5)
Eosinophils Relative: 3 %
HEMATOCRIT: 36.6 % (ref 36.0–46.0)
Hemoglobin: 11.7 g/dL — ABNORMAL LOW (ref 12.0–15.0)
Immature Granulocytes: 0 %
Lymphocytes Relative: 26 %
Lymphs Abs: 2.5 10*3/uL (ref 0.7–4.0)
MCH: 30.5 pg (ref 26.0–34.0)
MCHC: 32 g/dL (ref 30.0–36.0)
MCV: 95.6 fL (ref 80.0–100.0)
Monocytes Absolute: 0.7 10*3/uL (ref 0.1–1.0)
Monocytes Relative: 8 %
Neutro Abs: 5.9 10*3/uL (ref 1.7–7.7)
Neutrophils Relative %: 62 %
Platelets: 260 10*3/uL (ref 150–400)
RBC: 3.83 MIL/uL — ABNORMAL LOW (ref 3.87–5.11)
RDW: 12.6 % (ref 11.5–15.5)
WBC: 9.5 10*3/uL (ref 4.0–10.5)
nRBC: 0 % (ref 0.0–0.2)

## 2018-10-29 LAB — GLUCOSE, CAPILLARY
Glucose-Capillary: 103 mg/dL — ABNORMAL HIGH (ref 70–99)
Glucose-Capillary: 157 mg/dL — ABNORMAL HIGH (ref 70–99)
Glucose-Capillary: 212 mg/dL — ABNORMAL HIGH (ref 70–99)

## 2018-10-29 NOTE — ED Notes (Signed)
Pt assisted to toilet in room. Pt's gait steady

## 2018-10-29 NOTE — ED Provider Notes (Signed)
[facility] Emergency Department Provider Note  ____________________________________________  Time seen: Approximately 11:54 AM  I have reviewed the triage vital signs and the nursing notes.   HISTORY  Chief Complaint Hypoglycemia   HPI Vicki [name] is a 78 y.o. female with h/o DM on insulin, HTN, CVA, CADwho presents for evaluation for hypoglycemia. Patient reports that this morning she noticed that her vision was blurry which is usually how she feels when her sugar is low. She checked her sugar and it was low but she does not remember how low. She ate some crackers but symptoms did not improve so she called 911.  When EMS arrived patient's blood glucose was 79.  They gave her 100 mL's of D10.  Repeat per EMS was 156.  Patient reports that her vision is improved but still slightly blurry.  She reports compliance with her medications.  She denies any recent changes in her insulin.  She takes glargine at bedtime and a fixed amount of Humalog with every meal.  She reports that she has been eating and drinking normally.  She denies recent illness and denies cough, chest pain, shortness of breath, abdominal pain, nausea, vomiting, diarrhea, dysuria, hematuria, fever.  Patient denies dizziness, slurred speech, unilateral weakness or numbness, facial droop.  Past Medical History:  Diagnosis Date  . Diabetes mellitus without complication (HCC)   . GERD (gastroesophageal reflux disease)   . Hypertension   . Lesion of sciatic nerve, right side   . MI (myocardial infarction) (HCC)   . Stroke [facility])     Patient Active Problem List   Diagnosis Date Noted  . Hypoglycemia 12/10/2017  . 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 NSTEMI (non-ST elevated myocardial infarction) (HCC) 10/01/2016  . 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 Diabetes (HCC) 09/29/2016  . GERD (gastroesophageal reflux  disease) 09/29/2016  . Nausea and vomiting 03/24/2016  . Diffuse abdominal pain 03/24/2016  . Hypokalemia 03/24/2016  . Chest pain 03/24/2016  . Elevated troponin I level 09/26/2015  . DKA (diabetic ketoacidoses) (HCC) 09/25/2015    Past Surgical History:  Procedure Laterality Date  . ABDOMINAL HYSTERECTOMY    . BREAST BIOPSY Right    neg  . BREAST SURGERY    . LEFT HEART CATH AND CORONARY ANGIOGRAPHY N/A 10/01/2016   Procedure: Left Heart Cath and Coronary Angiography and possible PCI;  Surgeon: Alwyn Pea, MD;  Location: [facility] INVASIVE CV LAB;  Service: Cardiovascular;  Laterality: N/A;    Prior to Admission medications   Medication Sig Start Date End Date Taking? Authorizing Provider  acetaminophen (TYLENOL) 500 MG tablet Take 1,000-1,500 mg by mouth every 6 (six) hours as needed for mild pain or headache.    [provider]  aspirin EC 81 MG EC tablet Take 1 tablet (81 mg total) by mouth daily. 03/26/16   Katharina Caper, MD  brimonidine-timolol (COMBIGAN) 0.2-0.5 % ophthalmic solution Place 1 drop into both eyes daily.    [provider]  cephALEXin (KEFLEX) 500 MG capsule Take 1 capsule (500 mg total) by mouth 2 (two) times daily. 06/11/18   Governor Rooks, MD  citalopram (CELEXA) 20 MG tablet Take 20 mg by mouth daily.    [provider]  docusate sodium (COLACE) 100 MG capsule Take 1 capsule (100 mg total) by mouth 2 (two) times daily. 10/04/16   Auburn Bilberry, MD  donepezil (ARICEPT) 5 MG tablet Take 5 mg  by mouth at bedtime.    [provider]  fluticasone (FLONASE) 50 MCG/ACT nasal spray Place 2 sprays into both nostrils daily.    [provider]  hydrALAZINE (APRESOLINE) 50 MG tablet Take 1 tablet (50 mg total) by mouth every 8 (eight) hours. 03/12/18   [name], MD  hydrochlorothiazide (HYDRODIURIL) 12.5 MG tablet Take 1 tablet (12.5 mg total) by mouth daily. 10/03/16   Auburn Bilberry, MD  Insulin Glargine (TOUJEO  SOLOSTAR) 300 UNIT/ML SOPN Inject 30 Units into the skin at bedtime. 12/11/17   Houston Siren, MD  insulin lispro (HUMALOG) 100 UNIT/ML injection Inject 0.04 mLs (4 Units total) into the skin 3 (three) times daily before meals. Pt uses per sliding scale. 12/11/17   Houston Siren, MD  isosorbide mononitrate (IMDUR) 30 MG 24 hr tablet Take 1 tablet (30 mg total) by mouth daily. 03/26/16   Katharina Caper, MD  labetalol (NORMODYNE) 100 MG tablet Take 100 mg by mouth 2 (two) times daily.    [provider]  loperamide (IMODIUM A-D) 2 MG tablet Take 1 tablet (2 mg total) by mouth 4 (four) times daily as needed for diarrhea or loose stools. 08/30/18   Nita Sickle, MD  loratadine (CLARITIN) 10 MG tablet Take 10 mg by mouth daily.    [provider]  lovastatin (MEVACOR) 40 MG tablet Take 40 mg by mouth at bedtime.     [provider]  magnesium oxide (MAG-OX) 400 MG tablet Take 400 mg by mouth daily.    [provider]  Multiple Vitamins-Minerals (CENTRUM ADULTS) TABS Take 1 tablet by mouth every morning.     [provider]  omeprazole (PRILOSEC) 20 MG capsule Take 1 capsule (20 mg total) by mouth daily. 09/28/15   Katha Hamming, MD  ondansetron (ZOFRAN ODT) 4 MG disintegrating tablet Take 1 tablet (4 mg total) by mouth every 8 (eight) hours as needed for nausea or vomiting. 05/13/17   Irean Hong, MD  polyethylene glycol [facility]) packet Take 17 g by mouth daily. 05/03/15   [name], MD  quinapril (ACCUPRIL) 20 MG tablet Take 2 tablets (40 mg total) by mouth daily. 10/01/16   Auburn Bilberry, MD  traMADol (ULTRAM) 50 MG tablet Take 1 tablet (50 mg total) by mouth every 12 (twelve) hours as needed for moderate pain. 03/12/18 03/12/19  [name], MD    Allergies Patient has no known allergies.  Family History  Problem Relation Age of Onset  . Heart disease Mother   . Diabetes Mellitus II Father   . Hypertension  Sister   . Diabetes Mellitus II Sister   . Breast cancer Sister 74  . Hypertension Brother   . Diabetes Mellitus II Brother     Social History Social History   Tobacco Use  . Smoking status: Never Smoker  . Smokeless tobacco: Never Used  Substance Use Topics  . Alcohol use: No  . Drug use: No    Review of Systems  Constitutional: Negative for fever. Eyes: + blurry vision ENT: Negative for sore throat. Neck: No neck pain  Cardiovascular: Negative for chest pain. Respiratory: Negative for shortness of breath. Gastrointestinal: Negative for abdominal pain, vomiting or diarrhea. Genitourinary: Negative for dysuria. Musculoskeletal: Negative for back pain. Skin: Negative for rash. Neurological: Negative for headaches, weakness or numbness. Psych: No SI or HI  ____________________________________________   PHYSICAL EXAM:  VITAL SIGNS: ED Triage Vitals  Enc Vitals Group  BP 10/29/18 1148 (!) 143/58     Pulse Rate 10/29/18 1148 63     Resp 10/29/18 1145 18     Temp 10/29/18 1145 (!) 97.4 F (36.3 C)     Temp Source 10/29/18 1145 Oral     SpO2 10/29/18 1148 94 %     Weight 10/29/18 1145 157 lb (71.2 kg)     Height 10/29/18 1145  (1.626 m)     Head Circumference --      Peak Flow --      Pain Score 10/29/18 1145 0     Pain Loc --      Pain Edu? --      Excl. in GC? --     Constitutional: Alert and oriented. Well appearing and in no apparent distress. HEENT:      Head: Normocephalic and atraumatic.         Eyes: Conjunctivae are normal. Sclera is non-icteric.       Mouth/Throat: Mucous membranes are moist.       Neck: Supple with no signs of meningismus. Cardiovascular: Regular rate and rhythm. No murmurs, gallops, or rubs. 2+ symmetrical distal pulses are present in all extremities. No JVD. Respiratory: Normal respiratory effort. Lungs are clear to auscultation bilaterally. No wheezes, crackles, or rhonchi.  Gastrointestinal: Soft, non tender, and non  distended with positive bowel sounds. No rebound or guarding. Musculoskeletal: Nontender with normal range of motion in all extremities. No edema, cyanosis, or erythema of extremities. Neurologic: Normal speech and language. Face is symmetric. Moving all extremities. No gross focal neurologic deficits are appreciated. Skin: Skin is warm, dry and intact. No rash noted. Psychiatric: Mood and affect are normal. Speech and behavior are normal.  ____________________________________________   LABS (all labs ordered are listed, but only abnormal results are displayed)  Labs Reviewed  CBC WITH DIFFERENTIAL/PLATELET - Abnormal; Notable for the following components:      Result Value   RBC 3.83 (*)    Hemoglobin 11.7 (*)    All other components within normal limits  COMPREHENSIVE METABOLIC PANEL - Abnormal; Notable for the following components:   Glucose, Bld 142 (*)    Calcium 8.6 (*)    All other components within normal limits  URINALYSIS, COMPLETE (UACMP) WITH MICROSCOPIC - Abnormal; Notable for the following components:   Color, Urine STRAW (*)    APPearance CLEAR (*)    Specific Gravity, Urine 1.004 (*)    All other components within normal limits  GLUCOSE, CAPILLARY - Abnormal; Notable for the following components:   Glucose-Capillary 103 (*)    All other components within normal limits  GLUCOSE, CAPILLARY - Abnormal; Notable for the following components:   Glucose-Capillary 157 (*)    All other components within normal limits  GLUCOSE, CAPILLARY - Abnormal; Notable for the following components:   Glucose-Capillary 212 (*)    All other components within normal limits  CBG MONITORING, ED  CBG MONITORING, ED   ____________________________________________  EKG  none  ____________________________________________  RADIOLOGY  none ____________________________________________   PROCEDURES  Procedure(s) performed: None Procedures Critical Care performed:   None ____________________________________________   INITIAL IMPRESSION / ASSESSMENT AND PLAN / ED COURSE   78 y.o. female with h/o DM on insulin, HTN, CVA, CADwho presents for evaluation for hypoglycemia.  Patient denies any recent changes on her medications or taking any extra doses.  Was hypoglycemic at home.  Upon arrival to the emergency room blood glucose of 103 after receiving 100 mL's of  D10.  Will give oral crackers, peanut butter and orange juice. Will monitor closely for recurring hypoglycemia. Will check labs and UA.     _________________________ 3:01 PM on 10/29/2018 -----------------------------------------  Patient's blood glucose remained stable in the emergency room with no further drops.  Labs with no acute findings with no signs of dehydration, electrolyte abnormalities, urinary tract infection.  Patient remains well-appearing.  Will discharge home and recommend patient cut her long acting insulin by half to prevent any further episodes of hypoglycemia.  Discussed close follow-up with primary care doctor.   As part of my medical decision making, I reviewed the following data within the electronic MEDICAL RECORD NUMBER Nursing notes reviewed and incorporated, Labs reviewed , Old chart reviewed, Notes from prior ED visits and [place] Controlled Substance Database    Pertinent labs & imaging results that were available during my care of the patient were reviewed by me and considered in my medical decision making (see chart for details).    ____________________________________________   FINAL CLINICAL IMPRESSION(S) / ED DIAGNOSES  Final diagnoses:  Hypoglycemia      NEW MEDICATIONS STARTED DURING THIS VISIT:  ED Discharge Orders    None       Note:  This document was prepared using Dragon voice recognition software and may include unintentional dictation errors.    Don Perking, [place], MD 10/29/18 [phone]

## 2018-10-29 NOTE — ED Triage Notes (Signed)
Pt arrives via ems from home with concerns over hypoglycemia. EMS reports a blood sugar reading of 79 and pt was given of d10 in route.Prior to arrival ems repeats glucose reading with a finding of 156. PT arrives a&o x 4 with no complaints of pain. PT reports compliance with medication.

## 2018-10-29 NOTE — ED Notes (Signed)
Pt given graham cracker, peanut butter, and oj

## 2018-12-21 ENCOUNTER — Other Ambulatory Visit: Payer: Self-pay

## 2018-12-21 ENCOUNTER — Emergency Department: Payer: Medicare Other

## 2018-12-21 ENCOUNTER — Emergency Department
Admission: EM | Admit: 2018-12-21 | Discharge: 2018-12-21 | Disposition: A | Discharge status: Home / Self Care | Payer: Medicare Other | Attending: Emergency Medicine | Admitting: Emergency Medicine

## 2018-12-21 DIAGNOSIS — Z79899 Other long term (current) drug therapy: Secondary | ICD-10-CM | POA: Diagnosis not present

## 2018-12-21 DIAGNOSIS — Z794 Long term (current) use of insulin: Secondary | ICD-10-CM | POA: Diagnosis not present

## 2018-12-21 DIAGNOSIS — I10 Essential (primary) hypertension: Secondary | ICD-10-CM | POA: Diagnosis not present

## 2018-12-21 DIAGNOSIS — Z7982 Long term (current) use of aspirin: Secondary | ICD-10-CM | POA: Diagnosis not present

## 2018-12-21 DIAGNOSIS — Z8673 Personal history of transient ischemic attack (TIA), and cerebral infarction without residual deficits: Secondary | ICD-10-CM | POA: Diagnosis not present

## 2018-12-21 DIAGNOSIS — I252 Old myocardial infarction: Secondary | ICD-10-CM | POA: Diagnosis not present

## 2018-12-21 DIAGNOSIS — E1065 Type 1 diabetes mellitus with hyperglycemia: Secondary | ICD-10-CM | POA: Diagnosis not present

## 2018-12-21 DIAGNOSIS — R531 Weakness: Secondary | ICD-10-CM | POA: Diagnosis present

## 2018-12-21 LAB — URINALYSIS, COMPLETE (UACMP) WITH MICROSCOPIC
Bacteria, UA: NONE SEEN
Bilirubin Urine: NEGATIVE
Glucose, UA: NEGATIVE mg/dL
Hgb urine dipstick: NEGATIVE
Ketones, ur: NEGATIVE mg/dL
Leukocytes,Ua: NEGATIVE
Nitrite: NEGATIVE
Protein, ur: NEGATIVE mg/dL
Specific Gravity, Urine: 1.006 (ref 1.005–1.030)
pH: 5 (ref 5.0–8.0)

## 2018-12-21 LAB — GLUCOSE, CAPILLARY: Glucose-Capillary: 173 mg/dL — ABNORMAL HIGH (ref 70–99)

## 2018-12-21 LAB — BASIC METABOLIC PANEL
Anion gap: 7 (ref 5–15)
BUN: 29 mg/dL — ABNORMAL HIGH (ref 8–23)
CO2: 23 mmol/L (ref 22–32)
Calcium: 9 mg/dL (ref 8.9–10.3)
Chloride: 106 mmol/L (ref 98–111)
Creatinine, Ser: 1.17 mg/dL — ABNORMAL HIGH (ref 0.44–1.00)
GFR calc Af Amer: 51 mL/min — ABNORMAL LOW (ref >60)
GFR calc non Af Amer: 44 mL/min — ABNORMAL LOW (ref >60)
Glucose, Bld: 188 mg/dL — ABNORMAL HIGH (ref 70–99)
Potassium: 3.7 mmol/L (ref 3.5–5.1)
Sodium: 136 mmol/L (ref 135–145)

## 2018-12-21 LAB — CBC
HCT: 34.1 % — ABNORMAL LOW (ref 36.0–46.0)
Hemoglobin: 11.4 g/dL — ABNORMAL LOW (ref 12.0–15.0)
MCH: 30.8 pg (ref 26.0–34.0)
MCHC: 33.4 g/dL (ref 30.0–36.0)
MCV: 92.2 fL (ref 80.0–100.0)
Platelets: 272 10*3/uL (ref 150–400)
RBC: 3.7 MIL/uL — ABNORMAL LOW (ref 3.87–5.11)
RDW: 12.3 % (ref 11.5–15.5)
WBC: 7.8 10*3/uL (ref 4.0–10.5)
nRBC: 0 % (ref 0.0–0.2)

## 2018-12-21 LAB — TROPONIN I: Troponin I: <0.03 ng/mL (ref <0.03)

## 2018-12-21 LAB — LIPASE, BLOOD: Lipase: 31 U/L (ref 11–51)

## 2018-12-21 MED ORDER — SODIUM CHLORIDE 0.9 % IV BOLUS: 1000.0000 mL | Freq: Once | INTRAVENOUS | Status: DC

## 2018-12-21 NOTE — ED Triage Notes (Addendum)
Pt to ED via EMS from home. Pt states she has "felt sick" since yesterday and vomited once. Pt states when she woke up this morning her CBG was >500, pt took insulin and CBG is now 221 per ems. Pt denies pain or nausesa. Pt was given 4 zofran IM by ems.

## 2018-12-21 NOTE — Discharge Instructions (Addendum)
Please make sure to take your diabetes medications as prescribed.  Your blood work in the emergency department is reassuring.  Please call Dr. Lynnea Ferrier in the morning for an appointment this week.

## 2018-12-21 NOTE — ED Provider Notes (Addendum)
-----------------------------------------   2:32 PM on 12/21/2018 -----------------------------------------  I have seen and evaluated the patient.  Overall the patient appears well, no distress.  Patient states she has most worried about the high blood sugar this morning greater than 500.  States it was greater than 500 yesterday.  Patient took her normal morning medications including insulin.  Upon arrival patient's blood glucose is 173.  Patient's lab work has been very reassuring including a normal anion gap, negative troponin.  Urinalysis is pending.  As long as the patient's urinalysis appears well anticipate likely discharge home with PCP follow-up.  Patient agreeable to plan of care.   Minna Antis, MD 12/21/18 1433  EKG viewed and interpreted by myself shows sinus rhythm at 57 bpm with a narrow QRS, normal axis, normal intervals, nonspecific ST changes without ST elevation.    Minna Antis, MD 12/21/18 1504

## 2018-12-21 NOTE — ED Provider Notes (Signed)
[facility] Emergency Department Provider Note  ____________________________________________  Time seen: Approximately 1:24 PM  I have reviewed the triage vital signs and the nursing notes.   HISTORY  Chief Complaint Weakness    HPI Vicki [name] is a 78 y.o. female that presents to the emergency department for evaluation of weakness and elevated blood sugar for 2 days.  She had one episode of vomiting this morning.  She still feels a bit nauseous.  Patient states that her blood sugars have been 500 the last 2 mornings.  She had a normal bowel movement this morning.  Her blood sugars are usually about 100 in the mornings.  She takes Humalog in the morning and Toujeo at night.  She has a chart and knows how to dose her insulin. No sick contacts.  No fever, headache, chills, shortness of breath, chest pain, abdominal pain.   Past Medical History:  Diagnosis Date  . Diabetes mellitus without complication (HCC)   . GERD (gastroesophageal reflux disease)   . Hypertension   . Lesion of sciatic nerve, right side   . MI (myocardial infarction) (HCC)   . Stroke [facility](HCC)     Patient Active Problem List   Diagnosis Date Noted  . Hypoglycemia 12/10/2017  . 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 NSTEMI (non-ST elevated myocardial infarction) (HCC) 10/01/2016  . 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 Diabetes (HCC) 09/29/2016  . GERD (gastroesophageal reflux disease) 09/29/2016  . Nausea and vomiting 03/24/2016  . Diffuse abdominal pain 03/24/2016  . Hypokalemia 03/24/2016  . Chest pain 03/24/2016  . Elevated troponin I level 09/26/2015  . DKA (diabetic ketoacidoses) (HCC) 09/25/2015    Past Surgical History:  Procedure Laterality Date  . ABDOMINAL HYSTERECTOMY    . BREAST BIOPSY Right    neg  . BREAST SURGERY    . LEFT HEART CATH AND CORONARY ANGIOGRAPHY N/A 10/01/2016    Procedure: Left Heart Cath and Coronary Angiography and possible PCI;  Surgeon: Alwyn Peawayne D Callwood, MD;  Location: [facility] INVASIVE CV LAB;  Service: Cardiovascular;  Laterality: N/A;    Prior to Admission medications   Medication Sig Start Date End Date Taking? Authorizing Provider  acetaminophen (TYLENOL) 500 MG tablet Take 1,000-1,500 mg by mouth every 6 (six) hours as needed for mild pain or headache.    [provider]  aspirin EC 81 MG EC tablet Take 1 tablet (81 mg total) by mouth daily. 03/26/16   [name], [name], MD  brimonidine-timolol (COMBIGAN) 0.2-0.5 % ophthalmic solution Place 1 drop into both eyes daily.    [provider]  cephALEXin (KEFLEX) 500 MG capsule Take 1 capsule (500 mg total) by mouth 2 (two) times daily. 06/11/18   Governor RooksLord, Rebecca, MD  citalopram (CELEXA) 20 MG tablet Take 20 mg by mouth daily.    [provider]  docusate sodium (COLACE) 100 MG capsule Take 1 capsule (100 mg total) by mouth 2 (two) times daily. 10/04/16   [name], [name], MD  donepezil (ARICEPT) 5 MG tablet Take 5 mg by mouth at bedtime.    [provider]  fluticasone (FLONASE) 50 MCG/ACT nasal spray Place 2 sprays into both nostrils daily.    [provider]  hydrALAZINE (APRESOLINE) 50 MG tablet Take 1 tablet (50 mg total) by mouth every 8 (eight) hours. 03/12/18   Emily FilbertWilliams, Jonathan E, MD  hydrochlorothiazide (HYDRODIURIL) 12.5 MG tablet Take 1 tablet (12.5 mg total) by  mouth daily. 10/03/16   Auburn Bilberry, MD  Insulin Glargine (TOUJEO SOLOSTAR) 300 UNIT/ML SOPN Inject 30 Units into the skin at bedtime. 12/11/17   Houston Siren, MD  insulin lispro (HUMALOG) 100 UNIT/ML injection Inject 0.04 mLs (4 Units total) into the skin 3 (three) times daily before meals. Pt uses per sliding scale. 12/11/17   Houston Siren, MD  isosorbide mononitrate (IMDUR) 30 MG 24 hr tablet Take 1 tablet (30 mg total) by mouth daily. 03/26/16   Katharina Caper, MD  labetalol (NORMODYNE) 100  MG tablet Take 100 mg by mouth 2 (two) times daily.    [provider]  loperamide (IMODIUM A-D) 2 MG tablet Take 1 tablet (2 mg total) by mouth 4 (four) times daily as needed for diarrhea or loose stools. 08/30/18   Nita Sickle, MD  loratadine (CLARITIN) 10 MG tablet Take 10 mg by mouth daily.    [provider]  lovastatin (MEVACOR) 40 MG tablet Take 40 mg by mouth at bedtime.     [provider]  magnesium oxide (MAG-OX) 400 MG tablet Take 400 mg by mouth daily.    [provider]  Multiple Vitamins-Minerals (CENTRUM ADULTS) TABS Take 1 tablet by mouth every morning.     [provider]  omeprazole (PRILOSEC) 20 MG capsule Take 1 capsule (20 mg total) by mouth daily. 09/28/15   Katha Hamming, MD  ondansetron (ZOFRAN ODT) 4 MG disintegrating tablet Take 1 tablet (4 mg total) by mouth every 8 (eight) hours as needed for nausea or vomiting. 05/13/17   Irean Hong, MD  polyethylene glycol [facility]) packet Take 17 g by mouth daily. 05/03/15   [name], MD  quinapril (ACCUPRIL) 20 MG tablet Take 2 tablets (40 mg total) by mouth daily. 10/01/16   Auburn Bilberry, MD  traMADol (ULTRAM) 50 MG tablet Take 1 tablet (50 mg total) by mouth every 12 (twelve) hours as needed for moderate pain. 03/12/18 03/12/19  [name], MD    Allergies Patient has no known allergies.  Family History  Problem Relation Age of Onset  . Heart disease Mother   . Diabetes Mellitus II Father   . Hypertension Sister   . Diabetes Mellitus II Sister   . Breast cancer Sister 52  . Hypertension Brother   . Diabetes Mellitus II Brother     Social History Social History   Tobacco Use  . Smoking status: Never Smoker  . Smokeless tobacco: Never Used  Substance Use Topics  . Alcohol use: No  . Drug use: No     Review of Systems  Constitutional: No fever/chills ENT: No upper respiratory complaints. Cardiovascular: No chest  pain. Respiratory: No cough. No SOB. Gastrointestinal: No abdominal pain.  No nausea. Positive for vomiting x1.  Genitourinary: Negative for dysuria. Musculoskeletal: Negative for musculoskeletal pain. Skin: Negative for rash, abrasions, lacerations, ecchymosis. Neurological: Negative for headaches   ____________________________________________   PHYSICAL EXAM:  VITAL SIGNS: ED Triage Vitals  Enc Vitals Group     BP 12/21/18 1311 (!) 127/58     Pulse Rate 12/21/18 1310 (!) 57     Resp 12/21/18 1311 19     Temp 12/21/18 1310 97.7 F (36.5 C)     Temp Source 12/21/18 1310 Oral     SpO2 12/21/18 1311 98 %     Weight 12/21/18 1306 175 lb (79.4 kg)     Height 12/21/18 1306  (1.651 m)  Head Circumference --      Peak Flow --      Pain Score 12/21/18 1306 0     Pain Loc --      Pain Edu? --      Excl. in [place]? --      Constitutional: Alert and oriented. Well appearing and in no acute distress. Eyes: Conjunctivae are normal. PERRL. EOMI. Head: Atraumatic. ENT:      Ears:      Nose: No congestion/rhinnorhea.      Mouth/Throat: Mucous membranes are moist.  Neck: No stridor. Cardiovascular: Normal rate, regular rhythm.  Good peripheral circulation. Respiratory: Normal respiratory effort without tachypnea or retractions. Lungs CTAB. Good air entry to the bases with no decreased or absent breath sounds. Gastrointestinal: Bowel sounds 4 quadrants. Soft and nontender to palpation. No guarding or rigidity. No palpable masses. No distention. No CVA tenderness. Musculoskeletal: Full range of motion to all extremities. No gross deformities appreciated. Neurologic:  Normal speech and language. No gross focal neurologic deficits are appreciated.  Skin:  Skin is warm, dry and intact. No rash noted. Psychiatric: Mood and affect are normal. Speech and behavior are normal. Patient exhibits appropriate insight and judgement.   ____________________________________________    LABS (all labs ordered are listed, but only abnormal results are displayed)  Labs Reviewed  BASIC METABOLIC PANEL - Abnormal; Notable for the following components:      Result Value   Glucose, Bld 188 (*)    BUN 29 (*)    Creatinine, Ser 1.17 (*)    GFR calc non Af Amer 44 (*)    GFR calc Af Amer 51 (*)    All other components within normal limits  CBC - Abnormal; Notable for the following components:   RBC 3.70 (*)    Hemoglobin 11.4 (*)    HCT 34.1 (*)    All other components within normal limits  URINALYSIS, COMPLETE (UACMP) WITH MICROSCOPIC - Abnormal; Notable for the following components:   Color, Urine STRAW (*)    APPearance CLEAR (*)    All other components within normal limits  GLUCOSE, CAPILLARY - Abnormal; Notable for the following components:   Glucose-Capillary 173 (*)    All other components within normal limits  TROPONIN I  LIPASE, BLOOD  CBG MONITORING, ED   ____________________________________________  EKG  SB ____________________________________________  RADIOLOGY Lexine Baton, personally viewed and evaluated these images (plain radiographs) as part of my medical decision making, as well as reviewing the written report by the radiologist.  Dg Chest Portable 1 View  Result Date: 12/21/2018 CLINICAL DATA:  Hyperglycemia.  Vomiting.  Hypertension. EXAM: PORTABLE CHEST 1 VIEW COMPARISON:  September 25, 2018 FINDINGS: Lungs are clear. Heart size and pulmonary vascularity are normal. No adenopathy. There is aortic atherosclerosis. No bone lesions. IMPRESSION: No edema or consolidation. Stable cardiac silhouette. Aortic Atherosclerosis (ICD10-I70.0). Electronically Signed   By: Bretta Bang III M.D.   On: 12/21/2018 14:29    ____________________________________________    PROCEDURES  Procedure(s) performed:    Procedures    Medications  sodium chloride 0.9 % bolus 1,000 mL (0 mLs Intravenous Stopped 12/21/18 1345)      ____________________________________________   INITIAL IMPRESSION / ASSESSMENT AND PLAN / ED COURSE  Pertinent labs & imaging results that were available during my care of the patient were reviewed by me and considered in my medical decision making (see chart for details).  Review of the [place] CSRS was performed in accordance of the [facility]  prior to dispensing any controlled drugs.     Patient presented the emergency department for evaluation of weakness and elevated blood sugars for the last 2 days.  Patient's blood glucose on arrival was 173.  Patient lab work is all reassuring.  No indication of infection on urinalysis.  Chest x-ray negative for acute cardiopulmonary processes.  Dr. Deberah Castle has also evaluated the patient and is agreeable with plan of care.  Patient received a liter of fluids and felt "great "after fluids.  She will continue checking her influenza and using her insulin as directed.  She wants and is ready to go home.  She will call her endocrinologist tomorrow for an appointment. Patient is to follow up with endocrinology and PCP as directed. Patient is given ED precautions to return to the ED for any worsening or new symptoms.     ____________________________________________  FINAL CLINICAL IMPRESSION(S) / ED DIAGNOSES  Final diagnoses:  Uncontrolled type 1 diabetes mellitus with hyperglycemia (HCC)      NEW MEDICATIONS STARTED DURING THIS VISIT:  ED Discharge Orders    None          This chart was dictated using voice recognition software/Dragon. Despite best efforts to proofread, errors can occur which can change the meaning. Any change was purely unintentional.    Enid Derry, PA-C 12/21/18 1755    Minna Antis, MD 12/22/18 [phone]

## 2018-12-22 ENCOUNTER — Other Ambulatory Visit: Payer: Self-pay

## 2019-05-28 IMAGING — CR DG CHEST 2V
2 series · 2 of 2 positions shown · non-contrast
Comparison: Chest x-ray of July 20, 2017

CLINICAL DATA: Chest pain and shortness of breath since this
morning. History of hypertension, diabetes, previous MI, nonsmoker.

EXAM:
CHEST - 2 VIEW

[chest pa]
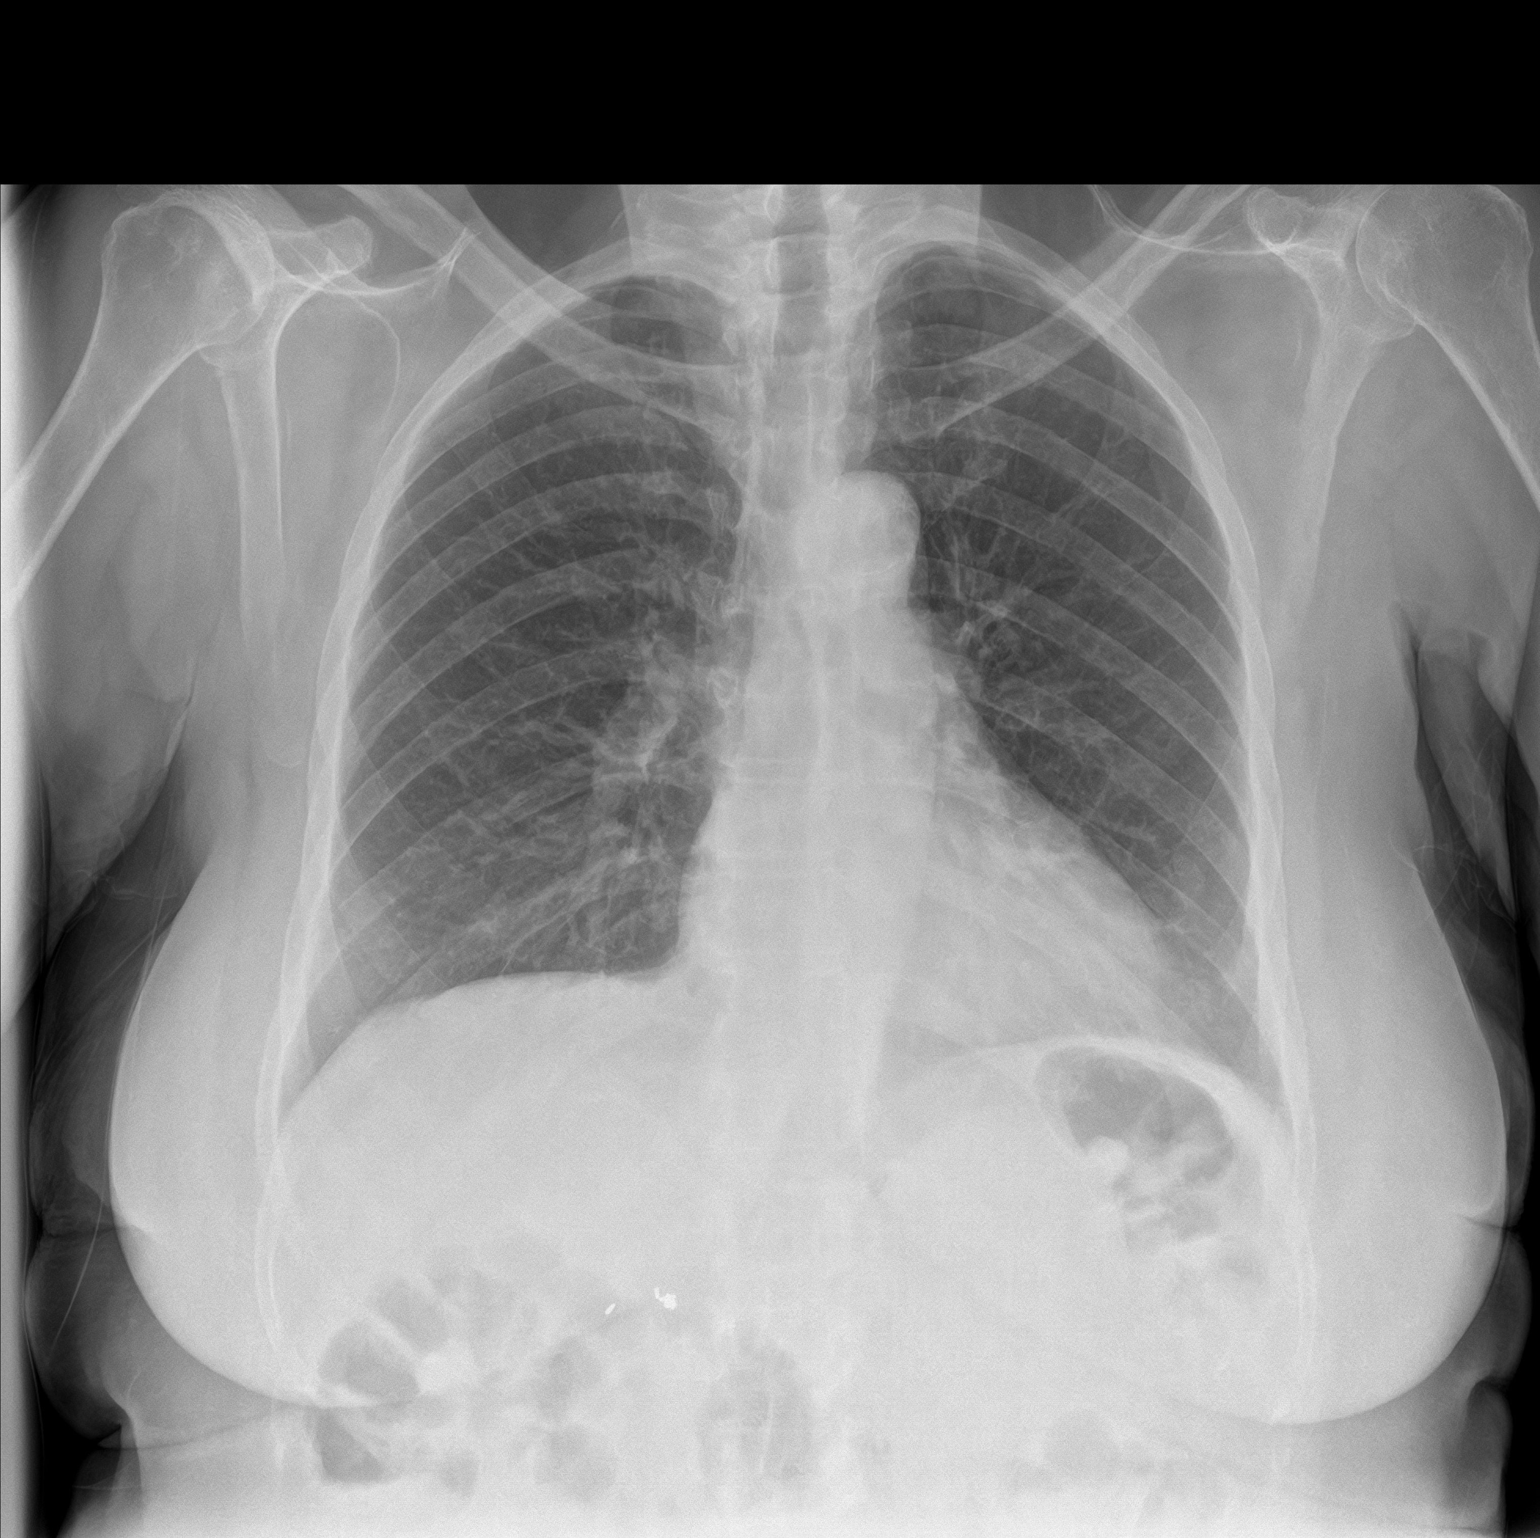

[chest lat]
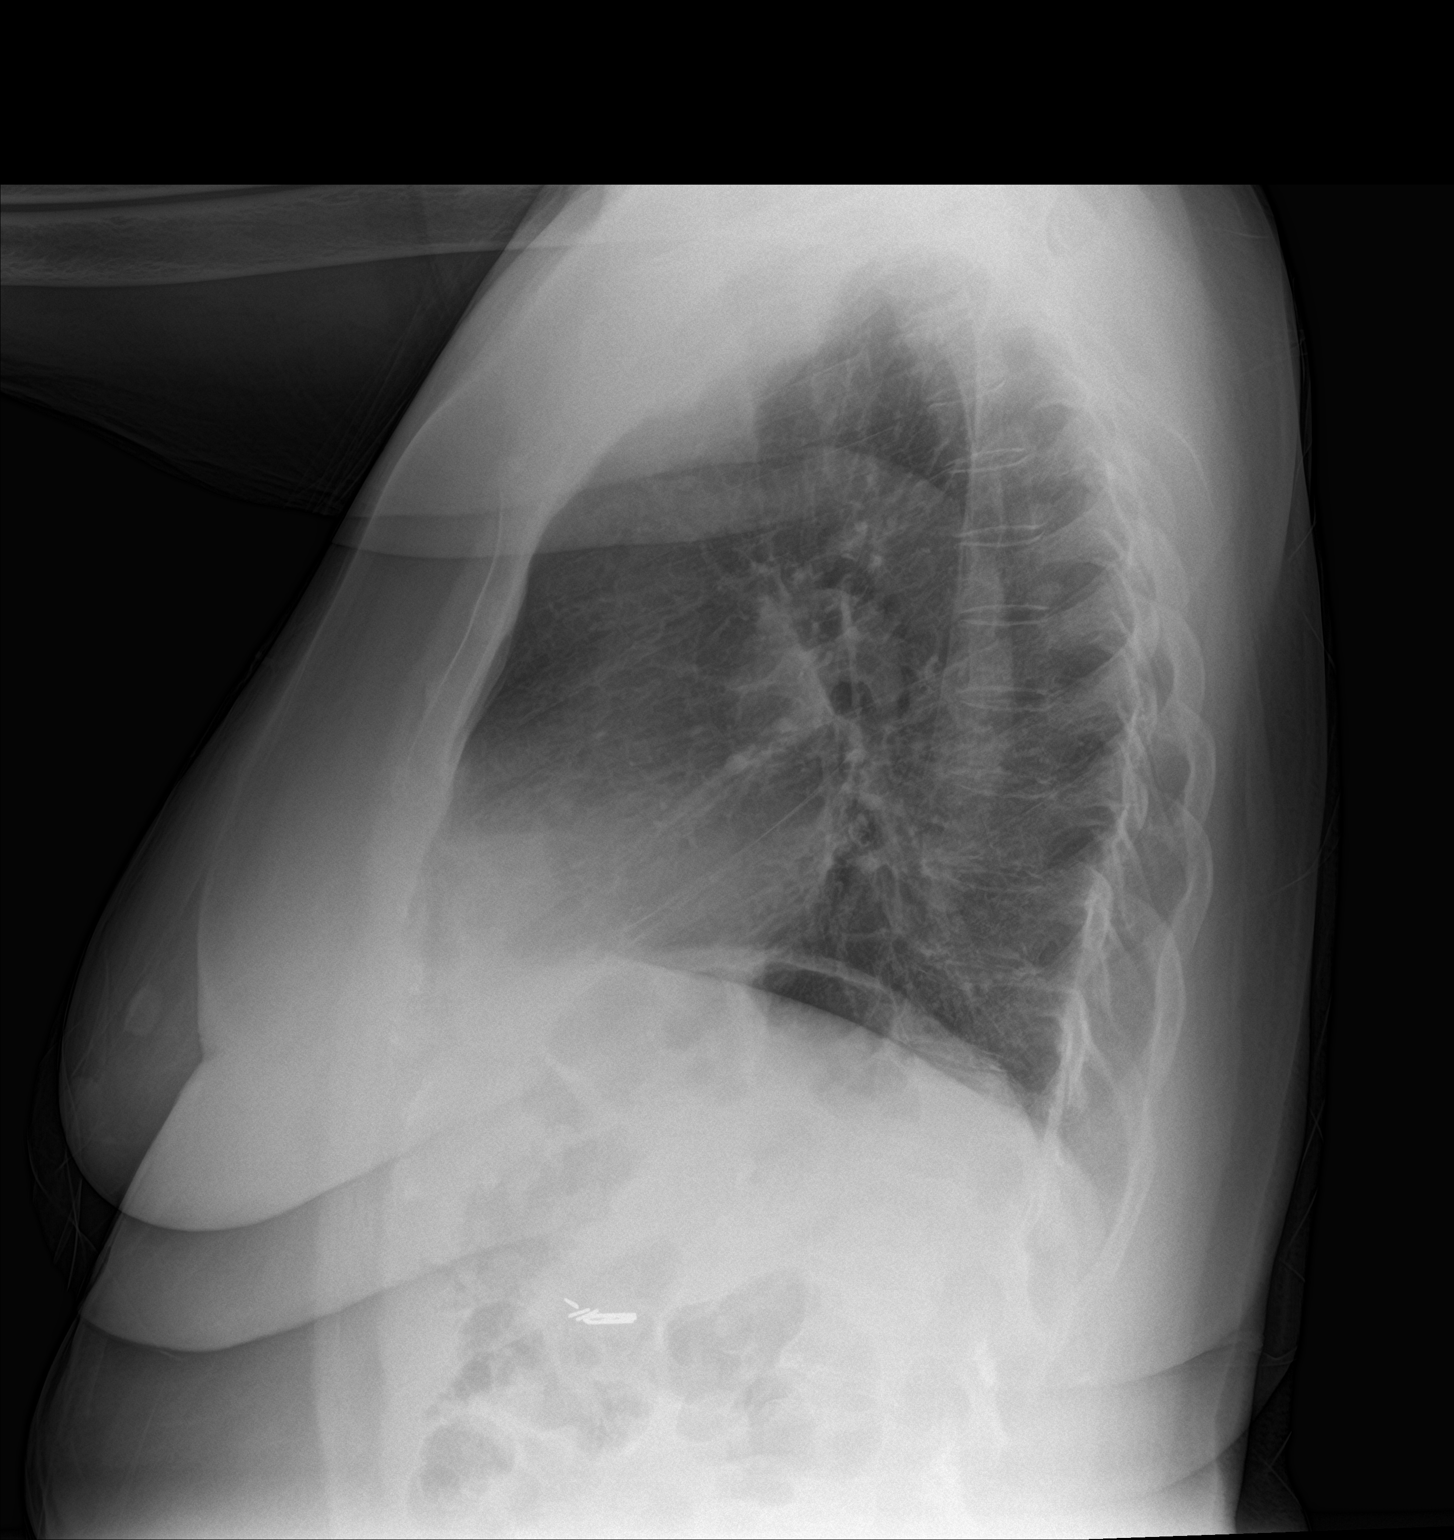

[2 of 2 positions shown; findings below may reference images not displayed]

FINDINGS: The lungs are adequately inflated. There is no focal infiltrate.
There is no pleural effusion or pneumothorax. The heart and
pulmonary vascularity are normal. The mediastinum is normal in
width. There is calcification in the wall of the aortic arch. The
bony thorax exhibits no acute abnormality.
IMPRESSION: There is no pneumonia nor other acute cardiopulmonary abnormality.

Thoracic aortic atherosclerosis.

## 2019-08-20 DIAGNOSIS — I10 Essential (primary) hypertension: Secondary | ICD-10-CM | POA: Diagnosis not present

## 2019-08-20 DIAGNOSIS — I25118 Atherosclerotic heart disease of native coronary artery with other forms of angina pectoris: Secondary | ICD-10-CM | POA: Diagnosis not present

## 2019-08-20 DIAGNOSIS — F3340 Major depressive disorder, recurrent, in remission, unspecified: Secondary | ICD-10-CM | POA: Diagnosis not present

## 2019-08-20 DIAGNOSIS — E1065 Type 1 diabetes mellitus with hyperglycemia: Secondary | ICD-10-CM | POA: Diagnosis not present

## 2019-08-20 DIAGNOSIS — R413 Other amnesia: Secondary | ICD-10-CM | POA: Diagnosis not present

## 2019-08-20 DIAGNOSIS — M5136 Other intervertebral disc degeneration, lumbar region: Secondary | ICD-10-CM | POA: Diagnosis not present

## 2019-08-20 DIAGNOSIS — F0150 Vascular dementia without behavioral disturbance: Secondary | ICD-10-CM | POA: Diagnosis not present

## 2019-08-20 DIAGNOSIS — E10319 Type 1 diabetes mellitus with unspecified diabetic retinopathy without macular edema: Secondary | ICD-10-CM | POA: Diagnosis not present

## 2019-08-20 DIAGNOSIS — G309 Alzheimer's disease, unspecified: Secondary | ICD-10-CM | POA: Diagnosis not present

## 2019-08-20 DIAGNOSIS — E782 Mixed hyperlipidemia: Secondary | ICD-10-CM | POA: Diagnosis not present

## 2019-08-20 DIAGNOSIS — E103293 Type 1 diabetes mellitus with mild nonproliferative diabetic retinopathy without macular edema, bilateral: Secondary | ICD-10-CM | POA: Diagnosis not present

## 2019-08-20 DIAGNOSIS — K219 Gastro-esophageal reflux disease without esophagitis: Secondary | ICD-10-CM | POA: Diagnosis not present

## 2019-08-31 DIAGNOSIS — E103293 Type 1 diabetes mellitus with mild nonproliferative diabetic retinopathy without macular edema, bilateral: Secondary | ICD-10-CM | POA: Diagnosis not present

## 2019-08-31 DIAGNOSIS — E1065 Type 1 diabetes mellitus with hyperglycemia: Secondary | ICD-10-CM | POA: Diagnosis not present

## 2019-09-07 DIAGNOSIS — E103293 Type 1 diabetes mellitus with mild nonproliferative diabetic retinopathy without macular edema, bilateral: Secondary | ICD-10-CM | POA: Diagnosis not present

## 2019-09-07 DIAGNOSIS — K219 Gastro-esophageal reflux disease without esophagitis: Secondary | ICD-10-CM | POA: Diagnosis not present

## 2019-09-07 DIAGNOSIS — E10319 Type 1 diabetes mellitus with unspecified diabetic retinopathy without macular edema: Secondary | ICD-10-CM | POA: Diagnosis not present

## 2019-09-07 DIAGNOSIS — G63 Polyneuropathy in diseases classified elsewhere: Secondary | ICD-10-CM | POA: Diagnosis not present

## 2019-09-07 DIAGNOSIS — I25118 Atherosclerotic heart disease of native coronary artery with other forms of angina pectoris: Secondary | ICD-10-CM | POA: Diagnosis not present

## 2019-09-07 DIAGNOSIS — R0781 Pleurodynia: Secondary | ICD-10-CM | POA: Diagnosis not present

## 2019-09-07 DIAGNOSIS — G309 Alzheimer's disease, unspecified: Secondary | ICD-10-CM | POA: Diagnosis not present

## 2019-09-07 DIAGNOSIS — E1065 Type 1 diabetes mellitus with hyperglycemia: Secondary | ICD-10-CM | POA: Diagnosis not present

## 2019-09-07 DIAGNOSIS — I10 Essential (primary) hypertension: Secondary | ICD-10-CM | POA: Diagnosis not present

## 2019-10-06 ENCOUNTER — Emergency Department: Payer: Medicare Other

## 2019-10-06 ENCOUNTER — Inpatient Hospital Stay
Admission: EM | Admit: 2019-10-06 | Discharge: 2019-10-10 | DRG: 638 | Disposition: A | Discharge status: Home Health | Payer: Medicare Other | Attending: Internal Medicine | Admitting: Internal Medicine

## 2019-10-06 ENCOUNTER — Other Ambulatory Visit: Payer: Self-pay

## 2019-10-06 DIAGNOSIS — R531 Weakness: Secondary | ICD-10-CM | POA: Diagnosis present

## 2019-10-06 DIAGNOSIS — Z79899 Other long term (current) drug therapy: Secondary | ICD-10-CM

## 2019-10-06 DIAGNOSIS — Z833 Family history of diabetes mellitus: Secondary | ICD-10-CM | POA: Diagnosis not present

## 2019-10-06 DIAGNOSIS — E119 Type 2 diabetes mellitus without complications: Secondary | ICD-10-CM | POA: Diagnosis not present

## 2019-10-06 DIAGNOSIS — E1110 Type 2 diabetes mellitus with ketoacidosis without coma: Principal | ICD-10-CM | POA: Diagnosis present

## 2019-10-06 DIAGNOSIS — Z7982 Long term (current) use of aspirin: Secondary | ICD-10-CM | POA: Diagnosis not present

## 2019-10-06 DIAGNOSIS — E871 Hypo-osmolality and hyponatremia: Secondary | ICD-10-CM | POA: Diagnosis present

## 2019-10-06 DIAGNOSIS — Z20822 Contact with and (suspected) exposure to covid-19: Secondary | ICD-10-CM | POA: Diagnosis present

## 2019-10-06 DIAGNOSIS — I248 Other forms of acute ischemic heart disease: Secondary | ICD-10-CM | POA: Diagnosis present

## 2019-10-06 DIAGNOSIS — H53129 Transient visual loss, unspecified eye: Secondary | ICD-10-CM | POA: Diagnosis present

## 2019-10-06 DIAGNOSIS — I152 Hypertension secondary to endocrine disorders: Secondary | ICD-10-CM | POA: Insufficient documentation

## 2019-10-06 DIAGNOSIS — F0390 Unspecified dementia without behavioral disturbance: Secondary | ICD-10-CM | POA: Diagnosis present

## 2019-10-06 DIAGNOSIS — E0810 Diabetes mellitus due to underlying condition with ketoacidosis without coma: Secondary | ICD-10-CM

## 2019-10-06 DIAGNOSIS — I10 Essential (primary) hypertension: Secondary | ICD-10-CM | POA: Diagnosis present

## 2019-10-06 DIAGNOSIS — Z794 Long term (current) use of insulin: Secondary | ICD-10-CM | POA: Diagnosis not present

## 2019-10-06 DIAGNOSIS — R778 Other specified abnormalities of plasma proteins: Secondary | ICD-10-CM | POA: Diagnosis not present

## 2019-10-06 DIAGNOSIS — K219 Gastro-esophageal reflux disease without esophagitis: Secondary | ICD-10-CM | POA: Diagnosis present

## 2019-10-06 DIAGNOSIS — R630 Anorexia: Secondary | ICD-10-CM | POA: Diagnosis present

## 2019-10-06 DIAGNOSIS — I252 Old myocardial infarction: Secondary | ICD-10-CM | POA: Diagnosis not present

## 2019-10-06 DIAGNOSIS — T383X6A Underdosing of insulin and oral hypoglycemic [antidiabetic] drugs, initial encounter: Secondary | ICD-10-CM | POA: Diagnosis present

## 2019-10-06 DIAGNOSIS — Z8249 Family history of ischemic heart disease and other diseases of the circulatory system: Secondary | ICD-10-CM

## 2019-10-06 DIAGNOSIS — E860 Dehydration: Secondary | ICD-10-CM | POA: Diagnosis not present

## 2019-10-06 DIAGNOSIS — N179 Acute kidney failure, unspecified: Secondary | ICD-10-CM | POA: Diagnosis present

## 2019-10-06 DIAGNOSIS — Z6825 Body mass index (BMI) 25.0-25.9, adult: Secondary | ICD-10-CM

## 2019-10-06 DIAGNOSIS — R112 Nausea with vomiting, unspecified: Secondary | ICD-10-CM | POA: Diagnosis present

## 2019-10-06 DIAGNOSIS — Z8673 Personal history of transient ischemic attack (TIA), and cerebral infarction without residual deficits: Secondary | ICD-10-CM

## 2019-10-06 DIAGNOSIS — F03A0 Unspecified dementia, mild, without behavioral disturbance, psychotic disturbance, mood disturbance, and anxiety: Secondary | ICD-10-CM | POA: Diagnosis present

## 2019-10-06 LAB — CBC WITH DIFFERENTIAL/PLATELET
Abs Immature Granulocytes: 0.04 10*3/uL (ref 0.00–0.07)
Basophils Absolute: 0 10*3/uL (ref 0.0–0.1)
Basophils Relative: 0 %
Eosinophils Absolute: 0.2 10*3/uL (ref 0.0–0.5)
Eosinophils Relative: 2 %
HCT: 37.7 % (ref 36.0–46.0)
Hemoglobin: 12.2 g/dL (ref 12.0–15.0)
Immature Granulocytes: 0 %
Lymphocytes Relative: 19 %
Lymphs Abs: 2.1 10*3/uL (ref 0.7–4.0)
MCH: 30.1 pg (ref 26.0–34.0)
MCHC: 32.4 g/dL (ref 30.0–36.0)
MCV: 93.1 fL (ref 80.0–100.0)
Monocytes Absolute: 0.7 10*3/uL (ref 0.1–1.0)
Monocytes Relative: 6 %
Neutro Abs: 8.1 10*3/uL — ABNORMAL HIGH (ref 1.7–7.7)
Neutrophils Relative %: 73 %
Platelets: 309 10*3/uL (ref 150–400)
RBC: 4.05 MIL/uL (ref 3.87–5.11)
RDW: 12 % (ref 11.5–15.5)
WBC: 11.2 10*3/uL — ABNORMAL HIGH (ref 4.0–10.5)
nRBC: 0 % (ref 0.0–0.2)

## 2019-10-06 LAB — GLUCOSE, CAPILLARY
Glucose-Capillary: 594 mg/dL (ref 70–99)
Glucose-Capillary: 593 mg/dL (ref 70–99)
Glucose-Capillary: 558 mg/dL (ref 70–99)
Glucose-Capillary: 323 mg/dL — ABNORMAL HIGH (ref 70–99)
Glucose-Capillary: 294 mg/dL — ABNORMAL HIGH (ref 70–99)
Glucose-Capillary: 106 mg/dL — ABNORMAL HIGH (ref 70–99)
Glucose-Capillary: 205 mg/dL — ABNORMAL HIGH (ref 70–99)
Glucose-Capillary: 245 mg/dL — ABNORMAL HIGH (ref 70–99)
Glucose-Capillary: 232 mg/dL — ABNORMAL HIGH (ref 70–99)

## 2019-10-06 LAB — URINALYSIS, COMPLETE (UACMP) WITH MICROSCOPIC
Bacteria, UA: NONE SEEN
Bilirubin Urine: NEGATIVE
Glucose, UA: >=500 mg/dL — AB
Hgb urine dipstick: NEGATIVE
Ketones, ur: 20 mg/dL — AB
Leukocytes,Ua: NEGATIVE
Nitrite: NEGATIVE
Protein, ur: NEGATIVE mg/dL
Specific Gravity, Urine: 1.017 (ref 1.005–1.030)
pH: 5 (ref 5.0–8.0)

## 2019-10-06 LAB — COMPREHENSIVE METABOLIC PANEL
ALT: 20 U/L (ref 0–44)
AST: 26 U/L (ref 15–41)
Albumin: 3.5 g/dL (ref 3.5–5.0)
Alkaline Phosphatase: 139 U/L — ABNORMAL HIGH (ref 38–126)
Anion gap: 19 — ABNORMAL HIGH (ref 5–15)
BUN: 49 mg/dL — ABNORMAL HIGH (ref 8–23)
CO2: 23 mmol/L (ref 22–32)
Calcium: 9.3 mg/dL (ref 8.9–10.3)
Chloride: 87 mmol/L — ABNORMAL LOW (ref 98–111)
Creatinine, Ser: 1.4 mg/dL — ABNORMAL HIGH (ref 0.44–1.00)
GFR calc Af Amer: 41 mL/min — ABNORMAL LOW (ref >60)
GFR calc non Af Amer: 35 mL/min — ABNORMAL LOW (ref >60)
Glucose, Bld: 667 mg/dL (ref 70–99)
Potassium: 4.9 mmol/L (ref 3.5–5.1)
Sodium: 129 mmol/L — ABNORMAL LOW (ref 135–145)
Total Bilirubin: 1.5 mg/dL — ABNORMAL HIGH (ref 0.3–1.2)
Total Protein: 6.7 g/dL (ref 6.5–8.1)

## 2019-10-06 LAB — RESPIRATORY PANEL BY RT PCR (FLU A&B, COVID)
Influenza A by PCR: NEGATIVE
Influenza B by PCR: NEGATIVE
SARS Coronavirus 2 by RT PCR: NEGATIVE

## 2019-10-06 LAB — BASIC METABOLIC PANEL
Anion gap: 18 — ABNORMAL HIGH (ref 5–15)
Anion gap: 9 (ref 5–15)
Anion gap: 8 (ref 5–15)
BUN: 45 mg/dL — ABNORMAL HIGH (ref 8–23)
BUN: 41 mg/dL — ABNORMAL HIGH (ref 8–23)
BUN: 36 mg/dL — ABNORMAL HIGH (ref 8–23)
CO2: 20 mmol/L — ABNORMAL LOW (ref 22–32)
CO2: 29 mmol/L (ref 22–32)
CO2: 28 mmol/L (ref 22–32)
Calcium: 9.9 mg/dL (ref 8.9–10.3)
Calcium: 9.6 mg/dL (ref 8.9–10.3)
Calcium: 9.1 mg/dL (ref 8.9–10.3)
Chloride: 93 mmol/L — ABNORMAL LOW (ref 98–111)
Chloride: 96 mmol/L — ABNORMAL LOW (ref 98–111)
Chloride: 99 mmol/L (ref 98–111)
Creatinine, Ser: 1.17 mg/dL — ABNORMAL HIGH (ref 0.44–1.00)
Creatinine, Ser: 0.94 mg/dL (ref 0.44–1.00)
Creatinine, Ser: 1.03 mg/dL — ABNORMAL HIGH (ref 0.44–1.00)
GFR calc Af Amer: 51 mL/min — ABNORMAL LOW (ref >60)
GFR calc Af Amer: >60 mL/min (ref >60)
GFR calc Af Amer: 59 mL/min — ABNORMAL LOW (ref >60)
GFR calc non Af Amer: 44 mL/min — ABNORMAL LOW (ref >60)
GFR calc non Af Amer: 57 mL/min — ABNORMAL LOW (ref >60)
GFR calc non Af Amer: 51 mL/min — ABNORMAL LOW (ref >60)
Glucose, Bld: 298 mg/dL — ABNORMAL HIGH (ref 70–99)
Glucose, Bld: 115 mg/dL — ABNORMAL HIGH (ref 70–99)
Glucose, Bld: 255 mg/dL — ABNORMAL HIGH (ref 70–99)
Potassium: 3.8 mmol/L (ref 3.5–5.1)
Potassium: 3.6 mmol/L (ref 3.5–5.1)
Potassium: 3.7 mmol/L (ref 3.5–5.1)
Sodium: 131 mmol/L — ABNORMAL LOW (ref 135–145)
Sodium: 134 mmol/L — ABNORMAL LOW (ref 135–145)
Sodium: 135 mmol/L (ref 135–145)

## 2019-10-06 LAB — BETA-HYDROXYBUTYRIC ACID
Beta-Hydroxybutyric Acid: 5.9 mmol/L — ABNORMAL HIGH (ref 0.05–0.27)
Beta-Hydroxybutyric Acid: 4.27 mmol/L — ABNORMAL HIGH (ref 0.05–0.27)
Beta-Hydroxybutyric Acid: 0.63 mmol/L — ABNORMAL HIGH (ref 0.05–0.27)

## 2019-10-06 LAB — TROPONIN I (HIGH SENSITIVITY)
Troponin I (High Sensitivity): 21 ng/L — ABNORMAL HIGH (ref <18)
Troponin I (High Sensitivity): 22 ng/L — ABNORMAL HIGH (ref <18)

## 2019-10-06 MED ORDER — ASPIRIN EC 81 MG PO TBEC
81.0000 mg | DELAYED_RELEASE_TABLET | Freq: Every day | ORAL | Status: DC
  Administered 2019-10-06 – 2019-10-10 (×5): 81 mg via ORAL
  Filled 2019-10-06 (×5): qty 1

## 2019-10-06 MED ORDER — ACETAMINOPHEN 500 MG PO TABS: 1000.0000 mg | ORAL_TABLET | Freq: Four times a day (QID) | ORAL | Status: DC | PRN

## 2019-10-06 MED ORDER — DEXTROSE-NACL 5-0.45 % IV SOLN: INTRAVENOUS | Status: DC

## 2019-10-06 MED ORDER — LABETALOL HCL 100 MG PO TABS
100.0000 mg | ORAL_TABLET | Freq: Two times a day (BID) | ORAL | Status: DC
  Administered 2019-10-06 – 2019-10-10 (×8): 100 mg via ORAL
  Filled 2019-10-06 (×13): qty 1

## 2019-10-06 MED ORDER — INSULIN REGULAR(HUMAN) IN NACL 100-0.9 UT/100ML-% IV SOLN
INTRAVENOUS | Status: DC
  Administered 2019-10-06: 13:00:00 8.5 [IU]/h via INTRAVENOUS

## 2019-10-06 MED ORDER — ENOXAPARIN SODIUM 40 MG/0.4ML ~~LOC~~ SOLN
40.0000 mg | SUBCUTANEOUS | Status: DC
  Administered 2019-10-06 – 2019-10-09 (×4): 40 mg via SUBCUTANEOUS
  Filled 2019-10-06 (×4): qty 0.4

## 2019-10-06 MED ORDER — POTASSIUM CHLORIDE 10 MEQ/100ML IV SOLN
10.0000 meq | INTRAVENOUS | Status: AC
  Administered 2019-10-06 (×2): 10 meq via INTRAVENOUS
  Filled 2019-10-06 (×2): qty 100

## 2019-10-06 MED ORDER — DEXTROSE IN LACTATED RINGERS 5 % IV SOLN: INTRAVENOUS | Status: DC

## 2019-10-06 MED ORDER — DEXTROSE 50 % IV SOLN: 0.0000 mL | INTRAVENOUS | Status: DC | PRN

## 2019-10-06 MED ORDER — DOCUSATE SODIUM 100 MG PO CAPS
100.0000 mg | ORAL_CAPSULE | Freq: Two times a day (BID) | ORAL | Status: DC
  Administered 2019-10-06 – 2019-10-10 (×8): 100 mg via ORAL
  Filled 2019-10-06 (×8): qty 1

## 2019-10-06 MED ORDER — ONDANSETRON HCL 4 MG/2ML IJ SOLN: 4.0000 mg | Freq: Four times a day (QID) | INTRAMUSCULAR | Status: DC | PRN

## 2019-10-06 MED ORDER — POLYETHYLENE GLYCOL 3350 17 G PO PACK
17.0000 g | PACK | Freq: Every day | ORAL | Status: DC
  Administered 2019-10-06 – 2019-10-10 (×5): 17 g via ORAL
  Filled 2019-10-06 (×5): qty 1

## 2019-10-06 MED ORDER — MAGNESIUM OXIDE 400 (241.3 MG) MG PO TABS
400.0000 mg | ORAL_TABLET | Freq: Every day | ORAL | Status: DC
  Administered 2019-10-06 – 2019-10-10 (×5): 400 mg via ORAL
  Filled 2019-10-06 (×5): qty 1

## 2019-10-06 MED ORDER — PANTOPRAZOLE SODIUM 40 MG PO TBEC
40.0000 mg | DELAYED_RELEASE_TABLET | Freq: Every day | ORAL | Status: DC
  Administered 2019-10-06 – 2019-10-10 (×5): 40 mg via ORAL
  Filled 2019-10-06 (×5): qty 1

## 2019-10-06 MED ORDER — CITALOPRAM HYDROBROMIDE 20 MG PO TABS
20.0000 mg | ORAL_TABLET | Freq: Every day | ORAL | Status: DC
  Administered 2019-10-06 – 2019-10-10 (×5): 20 mg via ORAL
  Filled 2019-10-06 (×4): qty 1

## 2019-10-06 MED ORDER — INSULIN ASPART 100 UNIT/ML ~~LOC~~ SOLN: 10.0000 [IU] | Freq: Once | SUBCUTANEOUS | Status: DC

## 2019-10-06 MED ORDER — MEMANTINE HCL 5 MG PO TABS
5.0000 mg | ORAL_TABLET | Freq: Every evening | ORAL | Status: DC
  Administered 2019-10-06 – 2019-10-09 (×4): 5 mg via ORAL
  Filled 2019-10-06 (×5): qty 1

## 2019-10-06 MED ORDER — INSULIN REGULAR(HUMAN) IN NACL 100-0.9 UT/100ML-% IV SOLN
INTRAVENOUS | Status: DC
  Administered 2019-10-06: 8.5 [IU]/h via INTRAVENOUS
  Administered 2019-10-06: 11:00:00 7 [IU]/h via INTRAVENOUS
  Filled 2019-10-06 (×2): qty 100

## 2019-10-06 MED ORDER — ADULT MULTIVITAMIN W/MINERALS CH
1.0000 | ORAL_TABLET | Freq: Every day | ORAL | Status: DC
  Administered 2019-10-06 – 2019-10-10 (×5): 1 via ORAL
  Filled 2019-10-06 (×5): qty 1

## 2019-10-06 MED ORDER — TIMOLOL MALEATE 0.5 % OP SOLN
1.0000 [drp] | Freq: Every day | OPHTHALMIC | Status: DC
  Administered 2019-10-06 – 2019-10-10 (×5): 1 [drp] via OPHTHALMIC
  Filled 2019-10-06: qty 5

## 2019-10-06 MED ORDER — SODIUM CHLORIDE 0.9 % IV BOLUS
500.0000 mL | Freq: Once | INTRAVENOUS | Status: AC
  Administered 2019-10-06: 500 mL via INTRAVENOUS

## 2019-10-06 MED ORDER — HYDRALAZINE HCL 25 MG PO TABS
25.0000 mg | ORAL_TABLET | Freq: Every day | ORAL | Status: DC
  Administered 2019-10-06 – 2019-10-08 (×3): 25 mg via ORAL
  Filled 2019-10-06 (×3): qty 1

## 2019-10-06 MED ORDER — ENOXAPARIN SODIUM 40 MG/0.4ML ~~LOC~~ SOLN: 30.0000 mg | SUBCUTANEOUS | Status: DC

## 2019-10-06 MED ORDER — SODIUM CHLORIDE 0.9 % IV SOLN: INTRAVENOUS | Status: DC

## 2019-10-06 MED ORDER — LORATADINE 10 MG PO TABS
10.0000 mg | ORAL_TABLET | Freq: Every day | ORAL | Status: DC
  Administered 2019-10-07 – 2019-10-10 (×4): 10 mg via ORAL
  Filled 2019-10-06 (×4): qty 1

## 2019-10-06 MED ORDER — BRIMONIDINE TARTRATE 0.2 % OP SOLN
1.0000 [drp] | Freq: Every day | OPHTHALMIC | Status: DC
  Administered 2019-10-06 – 2019-10-10 (×5): 1 [drp] via OPHTHALMIC
  Filled 2019-10-06: qty 5

## 2019-10-06 MED ORDER — FLUTICASONE PROPIONATE 50 MCG/ACT NA SUSP
2.0000 | Freq: Every day | NASAL | Status: DC | PRN
  Filled 2019-10-06: qty 16

## 2019-10-06 MED ORDER — ISOSORBIDE MONONITRATE ER 30 MG PO TB24
30.0000 mg | ORAL_TABLET | Freq: Every day | ORAL | Status: DC
  Administered 2019-10-06 – 2019-10-10 (×5): 30 mg via ORAL
  Filled 2019-10-06 (×4): qty 1

## 2019-10-06 MED ORDER — LACTATED RINGERS IV SOLN: INTRAVENOUS | Status: DC

## 2019-10-06 MED ORDER — DONEPEZIL HCL 5 MG PO TABS
5.0000 mg | ORAL_TABLET | Freq: Every day | ORAL | Status: DC
  Administered 2019-10-06 – 2019-10-09 (×4): 5 mg via ORAL
  Filled 2019-10-06 (×7): qty 1

## 2019-10-06 MED ORDER — PRAVASTATIN SODIUM 40 MG PO TABS
40.0000 mg | ORAL_TABLET | Freq: Every day | ORAL | Status: DC
  Administered 2019-10-06 – 2019-10-09 (×4): 40 mg via ORAL
  Filled 2019-10-06 (×2): qty 1
  Filled 2019-10-06: qty 2
  Filled 2019-10-06 (×2): qty 1

## 2019-10-06 MED ORDER — BRIMONIDINE TARTRATE-TIMOLOL 0.2-0.5 % OP SOLN
1.0000 [drp] | Freq: Every day | OPHTHALMIC | Status: DC
  Administered 2019-10-06: 13:00:00 1 [drp] via OPHTHALMIC
  Filled 2019-10-06: qty 5

## 2019-10-06 NOTE — Progress Notes (Signed)
   10/06/19 1308  Clinical Encounter Type  Visited With Patient  Visit Type Initial;Spiritual support  Referral From Nurse  Consult/Referral To Chaplain  Advance Directives (For Healthcare)  Would patient like information on creating a medical advance directive? No - Patient declined  Mental Health Advance Directives  Would patient like information on creating a mental health advance directive? No - Patient declined  Chaplain visited with patient in response to an OR, patient requested prayer. Patient said her sugar ws high and her cousin was scared that she was going to die in the house. Patient said that she is feeling much better. Patient got teary eyed when she started talking about her only son, who died in November 25, 2004. Chaplain offered pastoral presence, empathy, and prayer.

## 2019-10-06 NOTE — Progress Notes (Signed)
FYI: I talked with pt and she is running out of Toujeo and Humalog frequently b/c she can not always afford the copays of the insulin. I consulted TOC to see if she could get any assistance from [facility] but not sure that she can since she has Medicare. Dr. Tedd Sias has encouraged her to sign up for pt medication assistance programs but she would need help doing so (states she quit school and does not understand how to do things like that). May need more affordable insulins if she can't get any medication assistance. I also made recs for transition when she is ready (Lantus 20 units Q24H, CBGs Q4H, Novolog 0-15 Q4H). Thanks, Hilda Lias (Diabetes Coordinator)   Forwarded from secure chat.   Toya Smothers, NP

## 2019-10-06 NOTE — ED Notes (Signed)
Pt ambulated to toilet with slow but steady gait  

## 2019-10-06 NOTE — ED Notes (Signed)
cbg 558, per endotool insulin drip to stay at 8.5 units/h

## 2019-10-06 NOTE — ED Notes (Signed)
Sent light green, lavender, grey and blue tubes to lab

## 2019-10-06 NOTE — ED Provider Notes (Addendum)
[facility] Emergency Department Provider Note       Time seen: ----------------------------------------- 9:32 AM on 10/06/2019 -----------------------------------------   I have reviewed the triage vital signs and the nursing notes.  HISTORY   Chief Complaint Weakness    HPI Vicki [name] is a 79 y.o. female with a history of diabetes, GERD, hypertension, MI, CVA who presents to the ED for weakness with poor appetite for the last 2 weeks.  Patient states she just has not felt like eating anything and has been drinking shakes.  Patient states symptoms started after the second Covid vaccination.  Blood sugar was 585.  Past Medical History:  Diagnosis Date  . Diabetes mellitus without complication ([place])   . GERD (gastroesophageal reflux disease)   . Hypertension   . Lesion of sciatic nerve, right side   . MI (myocardial infarction) ([place])   . Stroke [facility])     Patient Active Problem List   Diagnosis Date Noted  . Hypoglycemia 12/10/2017  . Mild dementia ([place]) 08/14/2017  . 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 NSTEMI (non-ST elevated myocardial infarction) ([place]) 10/01/2016  . 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 Diabetes ([place]) 09/29/2016  . GERD (gastroesophageal reflux disease) 09/29/2016  . Nausea and vomiting 03/24/2016  . Diffuse abdominal pain 03/24/2016  . Hypokalemia 03/24/2016  . Chest pain 03/24/2016  . Elevated troponin I level 09/26/2015  . DKA (diabetic ketoacidoses) ([place]) 09/25/2015    Past Surgical History:  Procedure Laterality Date  . ABDOMINAL HYSTERECTOMY    . BREAST BIOPSY Right    neg  . BREAST SURGERY    . LEFT HEART CATH AND CORONARY ANGIOGRAPHY N/A 10/01/2016   Procedure: Left Heart Cath and Coronary Angiography and possible PCI;  Surgeon: Yolonda Kida, MD;  Location: [place] CV LAB;  Service: Cardiovascular;  Laterality: N/A;    Allergies Patient has no  known allergies.  Social History Social History   Tobacco Use  . Smoking status: Never Smoker  . Smokeless tobacco: Never Used  Substance Use Topics  . Alcohol use: No  . Drug use: No    Review of Systems Constitutional: Negative for fever. Cardiovascular: Negative for chest pain. Respiratory: Negative for shortness of breath. Gastrointestinal: Negative for abdominal pain, vomiting and diarrhea. Musculoskeletal: Negative for back pain. Skin: Negative for rash. Neurological: Positive for weakness  All systems negative/normal/unremarkable except as stated in the HPI  ____________________________________________   PHYSICAL EXAM:  VITAL SIGNS: ED Triage Vitals [10/06/19 0914]  Enc Vitals Group     BP      Pulse      Resp      Temp      Temp src      SpO2      Weight 150 lb 14.4 oz (68.4 kg)     Height 5\' 2"  (1.575 m)     Head Circumference      Peak Flow      Pain Score 0     Pain Loc      Pain Edu?      Excl. in [place]?     Constitutional: Alert and oriented. Well appearing and in no distress. Eyes: Conjunctivae are normal. Normal extraocular movements. ENT      Head: Normocephalic and atraumatic.      Nose: No congestion/rhinnorhea.      Mouth/Throat: Mucous membranes are moist.      Neck: No stridor. Cardiovascular: Normal rate, regular rhythm. No  murmurs, rubs, or gallops. Respiratory: Normal respiratory effort without tachypnea nor retractions. Breath sounds are clear and equal bilaterally. No wheezes/rales/rhonchi. Gastrointestinal: Soft and nontender. Normal bowel sounds Musculoskeletal: Nontender with normal range of motion in extremities. No lower extremity tenderness nor edema. Neurologic:  Normal speech and language. No gross focal neurologic deficits are appreciated.  Skin:  Skin is warm, dry and intact. No rash noted. Psychiatric: Mood and affect are normal. Speech and behavior are normal.  ____________________________________________  EKG:  Interpreted by me.  Sinus rhythm with rate of 73 bpm, normal PR interval, normal QRS, long QT  ____________________________________________  ED COURSE:  As part of my medical decision making, I reviewed the following data within the electronic MEDICAL RECORD NUMBER History obtained from family if available, nursing notes, old chart and ekg, as well as notes from prior ED visits. Patient presented for weakness, we will assess with labs and imaging as indicated at this time.   Procedures  Vicki [name] was evaluated in Emergency Department on 10/06/2019 for the symptoms described in the history of present illness. She was evaluated in the context of the global COVID-19 pandemic, which necessitated consideration that the patient might be at risk for infection with the SARS-CoV-2 virus that causes COVID-19. Institutional protocols and algorithms that pertain to the evaluation of patients at risk for COVID-19 are in a state of rapid change based on information released by regulatory bodies including the CDC and federal and state organizations. These policies and algorithms were followed during the patient's care in the ED.  ____________________________________________   LABS (pertinent positives/negatives)  Labs Reviewed  CBC WITH DIFFERENTIAL/PLATELET - Abnormal; Notable for the following components:      Result Value   WBC 11.2 (*)    Neutro Abs 8.1 (*)    All other components within normal limits  COMPREHENSIVE METABOLIC PANEL - Abnormal; Notable for the following components:   Sodium 129 (*)    Chloride 87 (*)    Glucose, Bld 667 (*)    BUN 49 (*)    Creatinine, Ser 1.40 (*)    Alkaline Phosphatase 139 (*)    Total Bilirubin 1.5 (*)    GFR calc non Af Amer 35 (*)    GFR calc Af Amer 41 (*)    Anion gap 19 (*)    All other components within normal limits  URINALYSIS, COMPLETE (UACMP) WITH MICROSCOPIC - Abnormal; Notable for the following components:   Color, Urine YELLOW (*)     APPearance CLEAR (*)    Glucose, UA >=500 (*)    Ketones, ur 20 (*)    All other components within normal limits  GLUCOSE, CAPILLARY - Abnormal; Notable for the following components:   Glucose-Capillary 594 (*)    All other components within normal limits  TROPONIN I (HIGH SENSITIVITY) - Abnormal; Notable for the following components:   Troponin I (High Sensitivity) 21 (*)    All other components within normal limits  RESPIRATORY PANEL BY RT PCR (FLU A&B, COVID)  BETA-HYDROXYBUTYRIC ACID  CBG MONITORING, ED   CRITICAL CARE Performed by: Ulice Dash   Total critical care time: 30 minutes  Critical care time was exclusive of separately billable procedures and treating other patients.  Critical care was necessary to treat or prevent imminent or life-threatening deterioration.  Critical care was time spent personally by me on the following activities: development of treatment plan with patient and/or surrogate as well as nursing, discussions with consultants, evaluation of patient's response  to treatment, examination of patient, obtaining history from patient or surrogate, ordering and performing treatments and interventions, ordering and review of laboratory studies, ordering and review of radiographic studies, pulse oximetry and re-evaluation of patient's condition.   RADIOLOGY Images were viewed by me  Chest x-ray IMPRESSION: No acute cardiopulmonary abnormalities. ____________________________________________   DIFFERENTIAL DIAGNOSIS   Dehydration, electrolyte abnormality, COVID-19 vaccination side effect, sepsis, MI, DKA, renal failure, hyperglycemia  FINAL ASSESSMENT AND PLAN  Weakness, hyperglycemia, dehydration, possible DKA   Plan: The patient had presented for weakness for the past 2 weeks. Patient's labs revealed significant hyperglycemia with blood sugar of 667.  Also elevated BUN and creatinine indicating dehydration.  Her anion gap was elevated, I have  ordered an insulin drip for her for what is likely mild DKA.  She also received some IV fluids.  Patient's imaging did not reveal any acute process.  I will discuss with the hospitalist for admission.   Ulice Dash, MD    Note: This note was generated in part or whole with voice recognition software. Voice recognition is usually quite accurate but there are transcription errors that can and very often do occur. I apologize for any typographical errors that were not detected and corrected.     [name], MD 10/06/19 1027    [name], MD 10/06/19 1036

## 2019-10-06 NOTE — Progress Notes (Signed)
PHARMACIST - PHYSICIAN COMMUNICATION  CONCERNING:  Enoxaparin (Lovenox) for DVT Prophylaxis    RECOMMENDATION: Patient was prescribed enoxaparin 30 mg q24 hours for VTE prophylaxis.   Filed Weights   10/06/19 0914 10/06/19 1227  Weight: 150 lb 14.4 oz (68.4 kg) 147 lb 11.3 oz (67 kg)    Body mass index is 25.35 kg/m.  Estimated Creatinine Clearance: 36.1 mL/min (A) (by C-G formula based on SCr of 1.17 mg/dL (H)).   Based on [facility] policy patient is candidate for enoxaparin 40 mg every 24 hours based on CrCl > 28ml/min  DESCRIPTION: Pharmacy has adjusted enoxaparin dose per [facility] policy.  Patient is now receiving enoxaparin 40mg  every 24 hours.  , PharmD Clinical Pharmacist  10/06/2019 4:55 PM
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
## 2019-10-06 NOTE — Progress Notes (Addendum)
Inpatient Diabetes Program Recommendations  AACE/ADA: New Consensus Statement on Inpatient Glycemic Control   Target Ranges:  Prepandial:   less than 140 mg/dL      Peak postprandial:   less than 180 mg/dL (1-2 hours)      Critically ill patients:  140 - 180 mg/dL   Results for Vicki [name], Vicki [name] (MRN [id number]) as of 10/06/2019 10:44  Ref. Range 10/06/2019 09:21  Glucose Latest Ref Range: 70 - 99 mg/dL 102 (HH)  Results for Vicki [name], Vicki [name] (MRN [id number]) as of 10/06/2019 10:44  Ref. Range 12/02/2017 17:30 12/10/2017 15:27  Hemoglobin A1C Latest Ref Range: 4.8 - 5.6 % 10.4 (H) 10.9 (H)   Review of Glycemic Control  Diabetes history: DM1 (per Dr. Pricilla Handler office notes) Outpatient Diabetes medications: Toujeo 30 units QHS, Humalog 0-12 units with meals and snacks Current orders for Inpatient glycemic control: IV insulin  Inpatient Diabetes Program Recommendations:   IV insulin: Noted IV insulin ordered this morning and started at 10:35 am today.   Insulin at transition from IV to SQ insulin: Once MD is ready to transition from IV to SQ insulin, please consider ordering Lantus 20 units Q24H (based on 67 kg x 0.3 units), CBGs Q4H, and Novolog 0-15 units Q4H.  NOTE: In reviewing chart, noted patient sees Dr. Tedd Sias (Endocrinologist) and was last seen 08/31/19. At that time, A1C was 11.7% and patient was instructed to take Toujeo 32 units QHS and Humalog 0-12 units before meals and snacks (dose depends on glucose). Patient currently in the Emergency Department with initial glucose of 667 mg/dl and has been started on IV insulin.  Per note by J. Fulcher, RN on 10/06/19 in ED Triage, patient reported she has not been taking her insulin due to no appetite. Agree with IV insulin at this time. Will continue to follow along while inpatient.  Addendum 10/06/19@13 :00-Spoke with patient about diabetes and home regimen for diabetes control. Patient reports being followed by Dr. Tedd Sias for diabetes management and  currently taking Toujeo 30 units QHS and Humalog 0-12 units (dose depends on glucose) TID with meals as an outpatient for diabetes control. Patient reports taking DM medications as prescribed when she has it. Patient admits that she runs out of insulin frequently due to cost. Patient reports she has not applied for any patient medication assistance programs in the past and notes that she quit school and would need help with trying to get signed up. Encouraged patient to see if family or someone at Dr. Pricilla Handler office can help with applying for patient assistance programs. Noted in the office visit note by Dr. Tedd Sias on 08/31/19 that patient was encouraged to sign up for patient medication assistance. Patient reports checking glucose 3-4 times per day and she notes that it is usually always high (up over 500 at times).   Discussed A1C results (11.7% on 08/20/19 ) and explained that current A1C indicates an average glucose of 289 mg/dl over the past 2-3 months. Discussed glucose and A1C goals. Discussed importance of checking CBGs and maintaining good CBG control to prevent long-term and short-term complications. Explained how hyperglycemia leads to damage within blood vessels which lead to the common complications seen with uncontrolled diabetes. Stressed to the patient the importance of improving glycemic control to prevent further complications from uncontrolled diabetes. Discussed impact of nutrition, exercise, stress, sickness, and medications on diabetes control. Stressed to patient that it was important that she took her insulin consistently as Dr. Tedd Sias prescribes. Encouraged patient to be honest  with Dr. Gabriel Carina and let her know if she does not have any insulin if she isn't able to afford it. Discussed generic insulins and explained that she may need to ask Dr. Gabriel Carina about more affordable insulins if she can not afford Toujeo and Humalog and if she does not qualify for medication assistance programs. Patient  verbalized understanding of information discussed and reports no further questions at this time related to diabetes.    Thanks, Barnie Alderman, RN, MSN, CDE Diabetes Coordinator Inpatient Diabetes Program [phone] (Team Pager from 8am to 5pm)

## 2019-10-06 NOTE — ED Triage Notes (Signed)
Pt arrives via [facility] from home for weakness and no appetite x 2 weeks. Reports she just hasnt felt like eating anything but has been drinking shakes (possibly Boost) since last week. A&Ox4 and in NAD, reports no pain. BG 585 in route, pt reports she has not been taking her insulin due to no appetite. Pt received covid vaccine #2 weeks ago. VSS in route.
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
## 2019-10-06 NOTE — ED Notes (Signed)
Report given to ICU RN

## 2019-10-07 DIAGNOSIS — N179 Acute kidney failure, unspecified: Secondary | ICD-10-CM

## 2019-10-07 DIAGNOSIS — E1110 Type 2 diabetes mellitus with ketoacidosis without coma: Principal | ICD-10-CM

## 2019-10-07 DIAGNOSIS — E0810 Diabetes mellitus due to underlying condition with ketoacidosis without coma: Secondary | ICD-10-CM

## 2019-10-07 DIAGNOSIS — R778 Other specified abnormalities of plasma proteins: Secondary | ICD-10-CM

## 2019-10-07 DIAGNOSIS — E860 Dehydration: Secondary | ICD-10-CM

## 2019-10-07 LAB — GLUCOSE, CAPILLARY
Glucose-Capillary: 122 mg/dL — ABNORMAL HIGH (ref 70–99)
Glucose-Capillary: 132 mg/dL — ABNORMAL HIGH (ref 70–99)
Glucose-Capillary: 135 mg/dL — ABNORMAL HIGH (ref 70–99)
Glucose-Capillary: 137 mg/dL — ABNORMAL HIGH (ref 70–99)
Glucose-Capillary: 142 mg/dL — ABNORMAL HIGH (ref 70–99)
Glucose-Capillary: 142 mg/dL — ABNORMAL HIGH (ref 70–99)
Glucose-Capillary: 155 mg/dL — ABNORMAL HIGH (ref 70–99)
Glucose-Capillary: 163 mg/dL — ABNORMAL HIGH (ref 70–99)
Glucose-Capillary: 184 mg/dL — ABNORMAL HIGH (ref 70–99)
Glucose-Capillary: 192 mg/dL — ABNORMAL HIGH (ref 70–99)

## 2019-10-07 LAB — BASIC METABOLIC PANEL
Anion gap: 8 (ref 5–15)
BUN: 32 mg/dL — ABNORMAL HIGH (ref 8–23)
CO2: 28 mmol/L (ref 22–32)
Calcium: 9.1 mg/dL (ref 8.9–10.3)
Chloride: 103 mmol/L (ref 98–111)
Creatinine, Ser: 0.92 mg/dL (ref 0.44–1.00)
GFR calc Af Amer: >60 mL/min (ref >60)
GFR calc non Af Amer: 59 mL/min — ABNORMAL LOW (ref >60)
Glucose, Bld: 145 mg/dL — ABNORMAL HIGH (ref 70–99)
Potassium: 3.5 mmol/L (ref 3.5–5.1)
Sodium: 139 mmol/L (ref 135–145)

## 2019-10-07 LAB — HEMOGLOBIN A1C
Hgb A1c MFr Bld: 12.6 % — ABNORMAL HIGH (ref 4.8–5.6)
Mean Plasma Glucose: 314.92 mg/dL

## 2019-10-07 LAB — BETA-HYDROXYBUTYRIC ACID: Beta-Hydroxybutyric Acid: 0.21 mmol/L (ref 0.05–0.27)

## 2019-10-07 MED ORDER — INSULIN ASPART 100 UNIT/ML ~~LOC~~ SOLN
0.0000 [IU] | Freq: Three times a day (TID) | SUBCUTANEOUS | Status: DC
  Administered 2019-10-07: 2 [IU] via SUBCUTANEOUS
  Administered 2019-10-07: 11 [IU] via SUBCUTANEOUS
  Administered 2019-10-07: 07:00:00 3 [IU] via SUBCUTANEOUS
  Administered 2019-10-08: 15 [IU] via SUBCUTANEOUS
  Administered 2019-10-08: 5 [IU] via SUBCUTANEOUS
  Administered 2019-10-09: 8 [IU] via SUBCUTANEOUS
  Administered 2019-10-09: 11 [IU] via SUBCUTANEOUS
  Administered 2019-10-10: 08:00:00 5 [IU] via SUBCUTANEOUS
  Administered 2019-10-10: 3 [IU] via SUBCUTANEOUS
  Filled 2019-10-07 (×10): qty 1

## 2019-10-07 MED ORDER — INSULIN GLARGINE 100 UNIT/ML ~~LOC~~ SOLN
20.0000 [IU] | Freq: Every day | SUBCUTANEOUS | Status: DC
  Administered 2019-10-07 – 2019-10-08 (×2): 20 [IU] via SUBCUTANEOUS
  Filled 2019-10-07 (×2): qty 0.2

## 2019-10-07 MED ORDER — GLUCERNA SHAKE PO LIQD
237.0000 mL | Freq: Three times a day (TID) | ORAL | Status: DC
  Administered 2019-10-07 – 2019-10-08 (×3): 237 mL via ORAL

## 2019-10-07 MED ORDER — SODIUM CHLORIDE 0.9 % IV SOLN: INTRAVENOUS | Status: DC

## 2019-10-07 NOTE — Progress Notes (Signed)
CH visited pt. while rounding on ICU; pt in bed, eager to talk w/CH; Pt. says she is just waiting for her blood sugar numbers to stabilize so she can get transferred to stepdown unit.  Pt. shared she lives w/her cousin but would love to move into her own place and have her own space; her husband died in 12-12-99, and her son died in Dec 12, 2002; pt.'s son served in the [employer] but became involved in drugs when he returned from active duty.  Pt. said she missed both husband and son; pt. requested prayer: 'I love prayer... I NEED prayer to get through the day, every day' she shared.  Pt. appears to be coping effectively; no further needs expressed.       10/07/19 1200  Clinical Encounter Type  Visited With Patient  Visit Type Initial;Spiritual support;Critical Care  Referral From Other (Comment) (Routine Rounding)  Consult/Referral To Chaplain  Recommendations Pt. really appreciates prayer  Spiritual Encounters  Spiritual Needs Prayer;Emotional;Grief support  Stress Factors  Patient Stress Factors Health changes;Loss;Major life changes

## 2019-10-07 NOTE — Progress Notes (Signed)
Initial Nutrition Assessment  DOCUMENTATION CODES:   Not applicable  INTERVENTION:   Glucerna Shake po TID, each supplement provides 220 kcal and 10 grams of protein  MVI daily   Pt likely at moderate refeed risk; recommend monitor K, Mg and P labs daily until stable.   NUTRITION DIAGNOSIS:   Inadequate oral intake related to acute illness as evidenced by per patient/family report.  GOAL:   Patient will meet greater than or equal to 90% of their needs  MONITOR:   PO intake, Supplement acceptance, Labs, Weight trends, Skin, I & O's  REASON FOR ASSESSMENT:   Consult Diet education  ASSESSMENT:   79 y.o. female with medical history significant of hypertension, insulin-dependent diabetes, MI, stroke, GERD who is admitted with DKA   Met with pt in room today. Pt reports that she is generally a good eater but reports poor appetite and oral intake for 2 weeks pta r/t nausea and lethargy. Pt reports that she had been drinking some Ensure supplements at home. Pt reports that her appetite is improved today; pt ate 100% of her breakfast this morning. Pt reports that she loves soup. Pt reports that she is willing to drink supplements in hospital; pt prefers Glucerna as it has less sugar. Suspect pt is at refeed risk. Per chart, pt has lost 13lbs(8%) in a little over a month; this is significant. Pt's UBW is ~160lbs.  Medications reviewed and include: aspirin, celexa, colace, lovenox, insulin, Mg oxide, MVI, protonix, NaCl '@75ml'$ /hr  Labs reviewed: BUN 32(H) Wbc- 11.2(H) cbgs- 137, 122, 142, 192, 163 x 24 hrs AIC 12.6(H)- 3/4  NUTRITION - FOCUSED PHYSICAL EXAM:    Most Recent Value  Orbital Region  No depletion  Upper Arm Region  No depletion  Thoracic and Lumbar Region  No depletion  Buccal Region  No depletion  Temple Region  No depletion  Clavicle Bone Region  Mild depletion  Clavicle and Acromion Bone Region  Mild depletion  Scapular Bone Region  No depletion  Dorsal Hand   Mild depletion  Patellar Region  Mild depletion  Anterior Thigh Region  No depletion  Posterior Calf Region  No depletion  Edema (RD Assessment)  None  Hair  Reviewed  Eyes  Reviewed  Mouth  Reviewed  Skin  Reviewed  Nails  Reviewed     Diet Order:   Diet Order            Diet Carb Modified Fluid consistency: Thin; Room service appropriate? Yes  Diet effective now             EDUCATION NEEDS:   Education needs have been addressed  Skin:  Skin Assessment: Reviewed RN Assessment  Last BM:  3/4- type 4  Height:   Ht Readings from Last 1 Encounters:  10/06/19 '5\' 4"'$  (1.626 m)    Weight:   Wt Readings from Last 1 Encounters:  10/06/19 67 kg    Ideal Body Weight:  54.5 kg  BMI:  Body mass index is 25.35 kg/m.  Estimated Nutritional Needs:   Kcal:  1500-1700kcal/day  Protein:  75-85g/day  Fluid:  >1.4L/day  Koleen Distance MS, RD, LDN Contact information available in Amion

## 2019-10-07 NOTE — Progress Notes (Signed)
PROGRESS NOTE    Vicki [name]  FWY:[id number] DOB: [birth year] DOA: 10/06/2019 PCP: Adin Hector, MD    Brief Narrative:  Vicki [name] is a 79 y.o. female with medical history significant of hypertension, insulin-dependent diabetes, MI, stroke, GERD presents to the emergency department chief complaint generalized weakness nausea and vomiting.  Patient reports no appetite for 2 weeks.  Associated symptoms include nausea with intermittent vomiting.  She states she has not been taking her insulin since she has not been eating and has had no appetite.  She has supplemented with some "shakes".  She also reports she fell 2 weeks ago on the ice and has not felt well since then.  She denies hitting her head or losing consciousness.  She does report she fell on her right hip but has had no pain with ambulation since.  She also reports getting her second Covid vaccination and symptoms starting shortly thereafter.  She denies abdominal pain chest pain palpitations shortness of breath headache dizziness syncope or near syncope.  She denies any dysuria hematuria frequency or urgency.  She denies diarrhea constipation melena or bright red blood per rectum.  She denies any visual disturbances numbness or tingling of extremities.  She denies any lower extremity edema or orthopnea. Work-up in the emergency department reveals a blood sugar of 585, an anion gap of 19 with CO2 of 23, hyponatremia with a sodium of 129, and creatinine 1.49.   Consultants:   None  Procedures:   Antimicrobials:   None   Subjective: Feeling better.  No new complaints.  Denies nausea or vomiting.  Objective: Vitals:   10/07/19 0200 10/07/19 0500 10/07/19 0645 10/07/19 0700  BP: (!) 89/76 (!) 159/69 (!) 151/115   Pulse: 68 62 75 69  Resp: 12 14 19  (!) 24  Temp: 98.5 F (36.9 C)     TempSrc: Oral     SpO2: 92% 96% 92% 96%  Weight:      Height:        Intake/Output Summary (Last 24 hours) at 10/07/2019 0836 Last  data filed at 10/07/2019 0600 Gross per 24 hour  Intake 1527.41 ml  Output 900 ml  Net 627.41 ml   Filed Weights   10/06/19 0914 10/06/19 1227  Weight: 68.4 kg 67 kg    Examination:  General exam: Appears calm and comfortable  Respiratory system: Clear to auscultation. Respiratory effort normal. Cardiovascular system: S1 & S2 heard, RRR. No JVD, murmurs, rubs, gallops or clicks. Gastrointestinal system: Abdomen is nondistended, soft and nontender. Normal bowel sounds heard. Central nervous system: Alert and oriented. No focal neurological deficits. Extremities: No edema Skin: Warm dry Psychiatry: Judgement and insight appear normal. Mood & affect appropriate.     Data Reviewed: I have personally reviewed following labs and imaging studies  CBC: Recent Labs  Lab 10/06/19 0921  WBC 11.2*  NEUTROABS 8.1*  HGB 12.2  HCT 37.7  MCV 93.1  PLT 277   Basic Metabolic Panel: Recent Labs  Lab 10/06/19 0921 10/06/19 1504 10/06/19 1810 10/06/19 2301 10/07/19 0258  NA 129* 131* 134* 135 139  K 4.9 3.8 3.6 3.7 3.5  CL 87* 93* 96* 99 103  CO2 23 20* 29 28 28   GLUCOSE 667* 298* 115* 255* 145*  BUN 49* 45* 41* 36* 32*  CREATININE 1.40* 1.17* 0.94 1.03* 0.92  CALCIUM 9.3 9.9 9.6 9.1 9.1   GFR: Estimated Creatinine Clearance: 45.9 mL/min (by C-G formula based on SCr of 0.92 mg/dL). Liver  Function Tests: Recent Labs  Lab 10/06/19 0921  AST 26  ALT 20  ALKPHOS 139*  BILITOT 1.5*  PROT 6.7  ALBUMIN 3.5   No results for input(s): LIPASE, AMYLASE in the last 168 hours. No results for input(s): AMMONIA in the last 168 hours. Coagulation Profile: No results for input(s): INR, PROTIME in the last 168 hours. Cardiac Enzymes: No results for input(s): CKTOTAL, CKMB, CKMBINDEX, TROPONINI in the last 168 hours. BNP (last 3 results) No results for input(s): PROBNP in the last 8760 hours. HbA1C: No results for input(s): HGBA1C in the last 72 hours. CBG: Recent Labs  Lab  10/07/19 0353 10/07/19 0505 10/07/19 0554 10/07/19 0655 10/07/19 0713  GLUCAP 137* 122* 142* 192* 163*   Lipid Profile: No results for input(s): CHOL, HDL, LDLCALC, TRIG, CHOLHDL, LDLDIRECT in the last 72 hours. Thyroid Function Tests: No results for input(s): TSH, T4TOTAL, FREET4, T3FREE, THYROIDAB in the last 72 hours. Anemia Panel: No results for input(s): VITAMINB12, FOLATE, FERRITIN, TIBC, IRON, RETICCTPCT in the last 72 hours. Sepsis Labs: No results for input(s): PROCALCITON, LATICACIDVEN in the last 168 hours.  Recent Results (from the past 240 hour(s))  Respiratory Panel by RT PCR (Flu A&B, Covid) - Nasopharyngeal Swab     Status: None   Collection Time: 10/06/19  9:38 AM   Specimen: Nasopharyngeal Swab  Result Value Ref Range Status   SARS Coronavirus 2 by RT PCR NEGATIVE NEGATIVE Final    Comment: (NOTE) SARS-CoV-2 target nucleic acids are NOT DETECTED. The SARS-CoV-2 RNA is generally detectable in upper respiratoy specimens during the acute phase of infection. The lowest concentration of SARS-CoV-2 viral copies this assay can detect is 131 copies/mL. A negative result does not preclude SARS-Cov-2 infection and should not be used as the sole basis for treatment or other patient management decisions. A negative result may occur with  improper specimen collection/handling, submission of specimen other than nasopharyngeal swab, presence of viral mutation(s) within the areas targeted by this assay, and inadequate number of viral copies (<131 copies/mL). A negative result must be combined with clinical observations, patient history, and epidemiological information. The expected result is Negative. Fact Sheet for Patients:  https://www.moore.com/ Fact Sheet for Healthcare Providers:  https://www.young.biz/ This test is not yet ap proved or cleared by the [place] FDA and  has been authorized for detection and/or diagnosis of  SARS-CoV-2 by FDA under an Emergency Use Authorization (EUA). This EUA will remain  in effect (meaning this test can be used) for the duration of the COVID-19 declaration under Section 564(b)(1) of the Act, 21 U.S.C. section 360bbb-3(b)(1), unless the authorization is terminated or revoked sooner.    Influenza A by PCR NEGATIVE NEGATIVE Final   Influenza B by PCR NEGATIVE NEGATIVE Final    Comment: (NOTE) The Xpert Xpress SARS-CoV-2/FLU/RSV assay is intended as an aid in  the diagnosis of influenza from Nasopharyngeal swab specimens and  should not be used as a sole basis for treatment. Nasal washings and  aspirates are unacceptable for Xpert Xpress SARS-CoV-2/FLU/RSV  testing. Fact Sheet for Patients: https://www.moore.com/ Fact Sheet for Healthcare Providers: https://www.young.biz/ This test is not yet approved or cleared by the [place] FDA and  has been authorized for detection and/or diagnosis of SARS-CoV-2 by  FDA under an Emergency Use Authorization (EUA). This EUA will remain  in effect (meaning this test can be used) for the duration of the  Covid-19 declaration under Section 564(b)(1) of the Act, 21  U.S.C. section 360bbb-3(b)(1), unless the  authorization is  terminated or revoked. Performed at [facility], [address], [place], [place] [address]          Radiology Studies: DG Chest 1 View  Result Date: 10/06/2019 CLINICAL DATA:  Weakness. Hyperglycemia. EXAM: CHEST  1 VIEW COMPARISON:  12/21/2018 FINDINGS: Heart size appears normal. There is no pleural effusion or edema. No airspace opacifications. The visualized osseous structures are unremarkable. IMPRESSION: No acute cardiopulmonary abnormalities. Electronically Signed   By: Signa Kell M.D.   On: 10/06/2019 10:14        Scheduled Meds: . aspirin EC  81 mg Oral Daily  . brimonidine  1 drop Both Eyes Daily   And  . timolol  1 drop Both Eyes Daily   . citalopram  20 mg Oral Daily  . docusate sodium  100 mg Oral BID  . donepezil  5 mg Oral QHS  . enoxaparin (LOVENOX) injection  40 mg Subcutaneous Q24H  . hydrALAZINE  25 mg Oral Daily  . insulin aspart  0-15 Units Subcutaneous TID WC  . insulin glargine  20 Units Subcutaneous QAC breakfast  . isosorbide mononitrate  30 mg Oral Daily  . labetalol  100 mg Oral BID  . loratadine  10 mg Oral Daily  . magnesium oxide  400 mg Oral Daily  . memantine  5 mg Oral QPM  . multivitamin with minerals  1 tablet Oral Daily  . pantoprazole  40 mg Oral Daily  . polyethylene glycol  17 g Oral Daily  . pravastatin  40 mg Oral q1800   Continuous Infusions: . sodium chloride 75 mL/hr at 10/07/19 0658  . dextrose 5% lactated ringers Stopped (10/07/19 0657)  . lactated ringers Stopped (10/06/19 1630)    Assessment & Plan:   Principal Problem:   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HCC) Active Problems:   Elevated troponin I level   Nausea and vomiting   Diabetes (HCC)   Mild dementia (HCC)   Dehydration   Generalized weakness   Acute kidney injury (HCC)   #1.  DKA.  Likely related to patient not taking her insulin due to poor appetite intermittent nausea and vomiting.  Started on DKA protocal. Insulin drip was initiated in the emergency department.   Transfer to tele floor Off insulin gtt HA1C is 12.6   #2.  Acute kidney injury.  Chart review indicates her creatinine 1 year ago was 0.70.  Creatinine on admission was 1.40.  Likely related to dehydration in the setting of #1.   Improved  -Hold nephrotoxins -Monitor urine output   #3.  Dehydration- 2/2 decrease po intake and DKA. Continue gentle ivf.   #4.  Generalized weakness/nausea.  Patient received her second Covid vaccine shortly thereafter her intermittent nausea and decreased oral intake started.  Baseline she lives at home.  He did have a recent fall on the ice 2 weeks ago. -see #1.  -zofran prn -PT consult  #5.  Mild Elevated troponin.  HS troponin 21.    Likely mild demand ischemia due to AKI and DKA.     EKG without acute changes.  Patient also asymptomatic  She does have a history of CAD/MI.  Continue BB, statin, aspirin, Imdur . -We will hold ACE inhibitor and HCTZ -monitor on tele   6.  Hypertension. Better controlled -We will hold ACE inhibitor and hydrochlorothiazide secondary to AKI -We will continue hydralazine, imdur, BB -monitor   #7.  GERD.  Stable at baseline. -Continue PPI  #10.  Mild dementia.  Appears stable at baseline. -Continue home meds    DVT prophylaxis: lovenox  Code Status: full Family Communication: patient Disposition Plan: home in 24-48 hours when medically stable from DKA stand point. Will obtain PT.  Will need transition of care upon discharge. Barrier: need management of DKA/BG. PT evalve prior to discharge. Discharge date: possibly 10/08/19       LOS: 1 day   Time spent: 45 minutes with more than 50% COC    Lynn Ito, MD [facility] Pager [phone]  If 7PM-7AM, please contact night-coverage [email or web address] Password [facility] 10/07/2019, 8:36 AM

## 2019-10-07 NOTE — Plan of Care (Signed)
Nutrition Education Note   RD consulted for nutrition education regarding diabetes.   79 y.o. female with medical history significant of hypertension, insulin-dependent diabetes, MI, stroke, GERD who is admitted with DKA  Lab Results  Component Value Date   HGBA1C 12.6 (H) 10/07/2019    RD provided "Nutrition and Type II Diabetes" handout from the Academy of Nutrition and Dietetics. Discussed different food groups and their effects on blood sugar, emphasizing carbohydrate-containing foods. Provided list of carbohydrates and recommended serving sizes of common foods.  Discussed importance of controlled and consistent carbohydrate intake throughout the day. Provided examples of ways to balance meals/snacks and encouraged intake of high-fiber, whole grain complex carbohydrates. Teach back method used.  Expect good compliance.  Body mass index is 25.35 kg/m. Pt meets criteria for overeweight based on current BMI.  RD following this pt  Betsey Holiday MS, RD, LDN Contact information available in Amion

## 2019-10-07 NOTE — Evaluation (Signed)
Physical Therapy Evaluation Patient Details Name: Vicki [name] MRN: [id number] DOB: [birth year] Today's Date: 10/07/2019   History of Present Illness   79 y.o. female with medical history significant of hypertension, insulin-dependent diabetes, MI, stroke, GERD presents to the emergency department chief complaint generalized weakness nausea and vomiting.  Patient reports no appetite for 2 weeks.  Associated symptoms include nausea with intermittent vomiting.  She states she has not been taking her insulin since she has not been eating and has had no appetite.  Pt had fall on ice a few weeks ago, landing on R LE and has had pain since (reports she had x-rays that were negative)  Clinical Impression  Pt was able to move well in bed and showed good confidence with initially standing up.  However after a brief bout of ambulation is became evident that she was hesitant to put much weight on the R and she started having some buckling (able to self arrest with walker) and quickly decided to turn around and head back to her room.  Pt states that she has been able to do some ambulation with cane since falling 2 weeks ago (has seen MD regarding this) but that it has been limited and painful since.  Overall pt did well enough to be able to go home (with assist from cousin and other family that can help) but will need RW as well as benefit from [facility] once discharged to be able to safely transition home and get back to more efficient and safe ambulation/mobility.     Follow Up Recommendations Home health PT    Equipment Recommendations  Rolling walker with 5" wheels    Recommendations for Other Services       Precautions / Restrictions Precautions Precautions: Fall Restrictions Weight Bearing Restrictions: No      Mobility  Bed Mobility Overal bed mobility: Modified Independent             General bed mobility comments: easily attains sitting w/o assist  Transfers Overall transfer level:  Modified independent Equipment used: Rolling walker (2 wheeled) Transfers: Sit to/from Stand Sit to Stand: Supervision         General transfer comment: Pt able to rise with good confidence and safety  Ambulation/Gait Ambulation/Gait assistance: Min guard Gait Distance (Feet): 45 Feet Assistive device: Rolling walker (2 wheeled)       General Gait Details: Pt was able to ambulate with minor limp that became worse and eventually started having buckling in the R knee with increased use of walker.  She did not have any LOBs but clearly did not have the confidence to walk much farther.  Self limiting 2/2 pain, vitals stable and WNL.  Stairs            Wheelchair Mobility    Modified Rankin (Stroke Patients Only)       Balance Overall balance assessment: Modified Independent(statically, reliant on walker with dynamic/weight shifts)                                           Pertinent Vitals/Pain Pain Assessment: Faces(no pain at rest, significant hesitancy with R WBing 2/2 knee) Faces Pain Scale: Hurts little more    Home Living Family/patient expects to be discharged to:: Private residence Living Arrangements: Other relatives(cousin) Available Help at Discharge: Family;Available 24 hours/day   Home Access: Stairs to enter Entrance Stairs-Rails: Right  Entrance Stairs-Number of Steps: 4 Home Layout: One level Home Equipment: Cane - single point      Prior Function Level of Independence: Independent with assistive device(s)         Comments: Pt reports she has had pain in R knee since fall 2wks ago     Hand Dominance        Extremity/Trunk Assessment   Upper Extremity Assessment Upper Extremity Assessment: Overall WFL for tasks assessed    Lower Extremity Assessment Lower Extremity Assessment: Overall WFL for tasks assessed(good strength and ROM in R knee despite funct issues)       Communication   Communication: No difficulties   Cognition Arousal/Alertness: Awake/alert Behavior During Therapy: WFL for tasks assessed/performed Overall Cognitive Status: Within Functional Limits for tasks assessed                                        General Comments      Exercises     Assessment/Plan    PT Assessment Patient needs continued PT services  PT Problem List Decreased strength;Decreased activity tolerance;Decreased range of motion;Decreased balance;Decreased mobility;Decreased coordination;Decreased knowledge of use of DME;Decreased safety awareness       PT Treatment Interventions DME instruction;Gait training;Stair training;Therapeutic activities;Functional mobility training;Therapeutic exercise;Balance training;Neuromuscular re-education;Patient/family education    PT Goals (Current goals can be found in the Care Plan section)  Acute Rehab PT Goals Patient Stated Goal: get back to walking better and control her blood sugars PT Goal Formulation: With patient Time For Goal Achievement: 10/21/19 Potential to Achieve Goals: Good    Frequency Min 2X/week   Barriers to discharge        Co-evaluation               AM-PAC PT "6 Clicks" Mobility  Outcome Measure Help needed turning from your back to your side while in a flat bed without using bedrails?: None Help needed moving from lying on your back to sitting on the side of a flat bed without using bedrails?: None Help needed moving to and from a bed to a chair (including a wheelchair)?: None Help needed standing up from a chair using your arms (e.g., wheelchair or bedside chair)?: None Help needed to walk in hospital room?: A Little Help needed climbing 3-5 steps with a railing? : A Little 6 Click Score: 22    End of Session Equipment Utilized During Treatment: Gait belt Activity Tolerance: Patient tolerated treatment well;Patient limited by pain Patient left: with call bell/phone within reach;in chair Nurse Communication:  Mobility status PT Visit Diagnosis: Muscle weakness (generalized) (M62.81);Difficulty in walking, not elsewhere classified (R26.2);Pain Pain - Right/Left: Right Pain - part of body: Knee    Time: 1410-1438 PT Time Calculation (min) (ACUTE ONLY): 28 min   Charges:   PT Evaluation $PT Eval Low Complexity: 1 Low PT Treatments $Gait Training: 8-22 mins        Malachi Pro, DPT 10/07/2019, 4:33 PM

## 2019-10-08 DIAGNOSIS — Z794 Long term (current) use of insulin: Secondary | ICD-10-CM

## 2019-10-08 DIAGNOSIS — E119 Type 2 diabetes mellitus without complications: Secondary | ICD-10-CM

## 2019-10-08 LAB — BASIC METABOLIC PANEL
Anion gap: 13 (ref 5–15)
BUN: 24 mg/dL — ABNORMAL HIGH (ref 8–23)
CO2: 23 mmol/L (ref 22–32)
Calcium: 9.1 mg/dL (ref 8.9–10.3)
Chloride: 98 mmol/L (ref 98–111)
Creatinine, Ser: 0.95 mg/dL (ref 0.44–1.00)
GFR calc Af Amer: >60 mL/min (ref >60)
GFR calc non Af Amer: 57 mL/min — ABNORMAL LOW (ref >60)
Glucose, Bld: 526 mg/dL (ref 70–99)
Potassium: 5 mmol/L (ref 3.5–5.1)
Sodium: 134 mmol/L — ABNORMAL LOW (ref 135–145)

## 2019-10-08 LAB — GLUCOSE, CAPILLARY
Glucose-Capillary: 474 mg/dL — ABNORMAL HIGH (ref 70–99)
Glucose-Capillary: 441 mg/dL — ABNORMAL HIGH (ref 70–99)
Glucose-Capillary: 222 mg/dL — ABNORMAL HIGH (ref 70–99)
Glucose-Capillary: 50 mg/dL — ABNORMAL LOW (ref 70–99)
Glucose-Capillary: 70 mg/dL (ref 70–99)
Glucose-Capillary: 168 mg/dL — ABNORMAL HIGH (ref 70–99)

## 2019-10-08 MED ORDER — INSULIN GLARGINE 100 UNIT/ML ~~LOC~~ SOLN
20.0000 [IU] | Freq: Every day | SUBCUTANEOUS | Status: DC
  Administered 2019-10-09: 20 [IU] via SUBCUTANEOUS
  Filled 2019-10-08: qty 0.2

## 2019-10-08 MED ORDER — INSULIN GLARGINE 100 UNIT/ML ~~LOC~~ SOLN
5.0000 [IU] | Freq: Once | SUBCUTANEOUS | Status: AC
  Administered 2019-10-08: 5 [IU] via SUBCUTANEOUS
  Filled 2019-10-08: qty 0.05

## 2019-10-08 MED ORDER — INSULIN GLARGINE 100 UNIT/ML ~~LOC~~ SOLN
25.0000 [IU] | Freq: Every day | SUBCUTANEOUS | Status: DC
  Filled 2019-10-08: qty 0.25

## 2019-10-08 MED ORDER — INSULIN ASPART 100 UNIT/ML ~~LOC~~ SOLN
15.0000 [IU] | Freq: Once | SUBCUTANEOUS | Status: AC
  Administered 2019-10-08: 15 [IU] via SUBCUTANEOUS
  Filled 2019-10-08: qty 1

## 2019-10-08 MED ORDER — POTASSIUM CHLORIDE CRYS ER 10 MEQ PO TBCR
10.0000 meq | EXTENDED_RELEASE_TABLET | Freq: Once | ORAL | Status: AC
  Administered 2019-10-08: 10 meq via ORAL
  Filled 2019-10-08: qty 1

## 2019-10-08 NOTE — Progress Notes (Signed)
Hypoglycemic Event  CBG: 50  Treatment: 4 oz juice/soda patient with meal tray eating  Symptoms: None  Follow-up CBG: Time: CBG Result:70  Possible Reasons for Event: Medication regimen: increase in lantus and novolog   Comments/MD notified:yes.     Abigayle Wilinski D Estha Few

## 2019-10-08 NOTE — Progress Notes (Signed)
   10/08/19 1345  Clinical Encounter Type  Visited With Patient  Visit Type Follow-up  Referral From Chaplain  Consult/Referral To Chaplain  Chaplain visited with patient. Patient said she was prepared to go home today, but her blood pressure and sugar were high. Patient is not sure why her numbers are high. Patient is willing to stay until all numbers are regulated. Chaplain offered pastoral presence, empathy, and prayer.

## 2019-10-08 NOTE — Care Management Important Message (Signed)
Important Message  Patient Details  Name: Vicki [name] MRN: [id number] Date of Birth: 12-06-1940   Medicare Important Message Given:  Yes     Johnell Comings 10/08/2019, 1:13 PM

## 2019-10-08 NOTE — Progress Notes (Signed)
Inpatient Diabetes Program Recommendations  AACE/ADA: New Consensus Statement on Inpatient Glycemic Control (2015)  Target Ranges:  Prepandial:   less than 140 mg/dL      Peak postprandial:   less than 180 mg/dL (1-2 hours)      Critically ill patients:  140 - 180 mg/dL   Results for Vicki [name], Vicki [name] (MRN [id number]) as of 10/08/2019 08:56  Ref. Range 10/07/2019 05:54 10/07/2019 06:55 10/07/2019 07:13 10/07/2019 21:56  Glucose-Capillary Latest Ref Range: 70 - 99 mg/dL 480 (H)  20 units LANTUS given at 5am 192 (H) 163 (H)  3 units NOVOLOG +  11 units NOVOLOG given at 12pm (No CBG for 12pm)  142 (H)   Results for [name], Vicki [name] (MRN [id number]) as of 10/08/2019 10:44  Ref. Range 10/08/2019 08:39 10/08/2019 10:23  Glucose-Capillary Latest Ref Range: 70 - 99 mg/dL 707 (H)  15 units NOVOLOG +  20 units LANTUS (both given at 9:13am) 441 (H)    Diabetes history: DM1 (per Dr. Pricilla Handler office notes)  Home DM Meds:  Toujeo 30 units QHS        Humalog 0-12 units with meals and snacks  Current Orders: Lantus 20 units Daily      Novolog Moderate Correction Scale/ SSI (0-15 units) TID AC     BMET from 7:55am showed: Glucose 526 mg/dl CO2 level 23 Anion Gap 13   MD- Unsure why CBG so high this AM??  Could it be from the Glucerna shakes pt is getting TID between meals?  Please consider the following:  1. Increase Lantus to 30 units Daily--Since dose already given this AM, please consider placing order for Lantus 10 units X 1 dose to be given this AM as well  2. Start Novolog Meal Coverage: Novolog 4 units TID with meals  (Please add the following Hold Parameters: Hold if pt eats <50% of meal, Hold if pt NPO)     --Will follow patient during hospitalization--  Ambrose Finland RN, MSN, CDE Diabetes Coordinator Inpatient Glycemic Control Team Team Pager: [phone] (8a-5p)

## 2019-10-08 NOTE — Progress Notes (Signed)
CRITICAL VALUE ALERT  Critical Value:  Glucose 526  Date & Time Notied:  10/08/19 0915  Provider Notified: Marylu Lund  Orders Received/Actions taken: give morning dose of lantus and full sliding scale dose-given. Will continue to monitor and recheck in 1 hour

## 2019-10-08 NOTE — Progress Notes (Signed)
PROGRESS NOTE    Vicki [name]  IWL:[id number] DOB: May 09, 1941 DOA: 10/06/2019 PCP: Lynnea Ferrier, MD    Brief Narrative:  Vicki [name] is a 79 y.o. female with medical history significant of hypertension, insulin-dependent diabetes, MI, stroke, GERD presents to the emergency department chief complaint generalized weakness nausea and vomiting.  Patient reports no appetite for 2 weeks.  Associated symptoms include nausea with intermittent vomiting.  She states she has not been taking her insulin since she has not been eating and has had no appetite.  She has supplemented with some "shakes".  She also reports she fell 2 weeks ago on the ice and has not felt well since then.  She denies hitting her head or losing consciousness.  She does report she fell on her right hip but has had no pain with ambulation since.  She also reports getting her second Covid vaccination and symptoms starting shortly thereafter.  She denies abdominal pain chest pain palpitations shortness of breath headache dizziness syncope or near syncope.  She denies any dysuria hematuria frequency or urgency.  She denies diarrhea constipation melena or bright red blood per rectum.  She denies any visual disturbances numbness or tingling of extremities.  She denies any lower extremity edema or orthopnea. Work-up in the emergency department reveals a blood sugar of 585, an anion gap of 19 with CO2 of 23, hyponatremia with a sodium of 129, and creatinine 1.49.   Consultants:   None  Procedures:   Antimicrobials:   None   Subjective: No new complaints.  This a.m. sugars were uncontrolled.  Denies nausea or vomiting.    Objective: Vitals:   10/08/19 0346 10/08/19 0842 10/08/19 1026 10/08/19 1142  BP: (!) 172/71 (!) 214/79 (!) 159/86   Pulse: 70 68 72   Resp: 16 19    Temp: 97.8 F (36.6 C) 97.9 F (36.6 C)    TempSrc: Oral Oral    SpO2: 98% 99%    Weight:    67.2 kg  Height:        Intake/Output Summary (Last  24 hours) at 10/08/2019 1446 Last data filed at 10/08/2019 1330 Gross per 24 hour  Intake 720 ml  Output 600 ml  Net 120 ml   Filed Weights   10/06/19 0914 10/06/19 1227 10/08/19 1142  Weight: 68.4 kg 67 kg 67.2 kg    Examination:  General exam: Appears calm and comfortable , NAD Respiratory system: Clear to auscultation. Respiratory effort normal. Cardiovascular system: S1 & S2 heard, RRR. No JVD, murmurs, rubs, gallops or clicks. Gastrointestinal system: Abdomen is nondistended, soft and nontender. Normal bowel sounds heard. Central nervous system: Alert and oriented. Grossly intact Extremities: No edema Skin: Warm dry Psychiatry: Judgement and insight appear normal. Mood & affect appropriate.     Data Reviewed: I have personally reviewed following labs and imaging studies  CBC: Recent Labs  Lab 10/06/19 0921  WBC 11.2*  NEUTROABS 8.1*  HGB 12.2  HCT 37.7  MCV 93.1  PLT 309   Basic Metabolic Panel: Recent Labs  Lab 10/06/19 1504 10/06/19 1810 10/06/19 2301 10/07/19 0258 10/08/19 0755  NA 131* 134* 135 139 134*  K 3.8 3.6 3.7 3.5 5.0  CL 93* 96* 99 103 98  CO2 20* 29 28 28 23   GLUCOSE 298* 115* 255* 145* 526*  BUN 45* 41* 36* 32* 24*  CREATININE 1.17* 0.94 1.03* 0.92 0.95  CALCIUM 9.9 9.6 9.1 9.1 9.1   GFR: Estimated Creatinine Clearance: 44.5  mL/min (by C-G formula based on SCr of 0.95 mg/dL). Liver Function Tests: Recent Labs  Lab 10/06/19 0921  AST 26  ALT 20  ALKPHOS 139*  BILITOT 1.5*  PROT 6.7  ALBUMIN 3.5   No results for input(s): LIPASE, AMYLASE in the last 168 hours. No results for input(s): AMMONIA in the last 168 hours. Coagulation Profile: No results for input(s): INR, PROTIME in the last 168 hours. Cardiac Enzymes: No results for input(s): CKTOTAL, CKMB, CKMBINDEX, TROPONINI in the last 168 hours. BNP (last 3 results) No results for input(s): PROBNP in the last 8760 hours. HbA1C: Recent Labs    10/07/19 0258  HGBA1C 12.6*    CBG: Recent Labs  Lab 10/07/19 0713 10/07/19 2156 10/08/19 0839 10/08/19 1023 10/08/19 1259  GLUCAP 163* 142* 474* 441* 222*   Lipid Profile: No results for input(s): CHOL, HDL, LDLCALC, TRIG, CHOLHDL, LDLDIRECT in the last 72 hours. Thyroid Function Tests: No results for input(s): TSH, T4TOTAL, FREET4, T3FREE, THYROIDAB in the last 72 hours. Anemia Panel: No results for input(s): VITAMINB12, FOLATE, FERRITIN, TIBC, IRON, RETICCTPCT in the last 72 hours. Sepsis Labs: No results for input(s): PROCALCITON, LATICACIDVEN in the last 168 hours.  Recent Results (from the past 240 hour(s))  Respiratory Panel by RT PCR (Flu A&B, Covid) - Nasopharyngeal Swab     Status: None   Collection Time: 10/06/19  9:38 AM   Specimen: Nasopharyngeal Swab  Result Value Ref Range Status   SARS Coronavirus 2 by RT PCR NEGATIVE NEGATIVE Final    Comment: (NOTE) SARS-CoV-2 target nucleic acids are NOT DETECTED. The SARS-CoV-2 RNA is generally detectable in upper respiratoy specimens during the acute phase of infection. The lowest concentration of SARS-CoV-2 viral copies this assay can detect is 131 copies/mL. A negative result does not preclude SARS-Cov-2 infection and should not be used as the sole basis for treatment or other patient management decisions. A negative result may occur with  improper specimen collection/handling, submission of specimen other than nasopharyngeal swab, presence of viral mutation(s) within the areas targeted by this assay, and inadequate number of viral copies (<131 copies/mL). A negative result must be combined with clinical observations, patient history, and epidemiological information. The expected result is Negative. Fact Sheet for Patients:  https://www.moore.com/ Fact Sheet for Healthcare Providers:  https://www.young.biz/ This test is not yet ap proved or cleared by the [place] FDA and  has been authorized for  detection and/or diagnosis of SARS-CoV-2 by FDA under an Emergency Use Authorization (EUA). This EUA will remain  in effect (meaning this test can be used) for the duration of the COVID-19 declaration under Section 564(b)(1) of the Act, 21 U.S.C. section 360bbb-3(b)(1), unless the authorization is terminated or revoked sooner.    Influenza A by PCR NEGATIVE NEGATIVE Final   Influenza B by PCR NEGATIVE NEGATIVE Final    Comment: (NOTE) The Xpert Xpress SARS-CoV-2/FLU/RSV assay is intended as an aid in  the diagnosis of influenza from Nasopharyngeal swab specimens and  should not be used as a sole basis for treatment. Nasal washings and  aspirates are unacceptable for Xpert Xpress SARS-CoV-2/FLU/RSV  testing. Fact Sheet for Patients: https://www.moore.com/ Fact Sheet for Healthcare Providers: https://www.young.biz/ This test is not yet approved or cleared by the [place] FDA and  has been authorized for detection and/or diagnosis of SARS-CoV-2 by  FDA under an Emergency Use Authorization (EUA). This EUA will remain  in effect (meaning this test can be used) for the duration of the  Covid-19 declaration  under Section 564(b)(1) of the Act, 21  U.S.C. section 360bbb-3(b)(1), unless the authorization is  terminated or revoked. Performed at [facility], [address] [place], [place] [address]          Radiology Studies: No results found.      Scheduled Meds: . aspirin EC  81 mg Oral Daily  . brimonidine  1 drop Both Eyes Daily   And  . timolol  1 drop Both Eyes Daily  . citalopram  20 mg Oral Daily  . docusate sodium  100 mg Oral BID  . donepezil  5 mg Oral QHS  . enoxaparin (LOVENOX) injection  40 mg Subcutaneous Q24H  . hydrALAZINE  25 mg Oral Daily  . insulin aspart  0-15 Units Subcutaneous TID WC  . [START ON 10/09/2019] insulin glargine  25 Units Subcutaneous QAC breakfast  . isosorbide mononitrate  30 mg Oral  Daily  . labetalol  100 mg Oral BID  . loratadine  10 mg Oral Daily  . magnesium oxide  400 mg Oral Daily  . memantine  5 mg Oral QPM  . multivitamin with minerals  1 tablet Oral Daily  . pantoprazole  40 mg Oral Daily  . polyethylene glycol  17 g Oral Daily  . pravastatin  40 mg Oral q1800   Continuous Infusions:   Assessment & Plan:   Principal Problem:   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HCC) Active Problems:   Elevated troponin I level   Nausea and vomiting   Diabetes (HCC)   Mild dementia (HCC)   Dehydration   Generalized weakness   Acute kidney injury (HCC)   Diabetic ketoacidosis without coma associated with diabetes mellitus due to underlying condition (HCC)   #1.  DKA.  Likely related to patient not taking her insulin due to poor appetite intermittent nausea and vomiting.  Started on DKA protocal.- now resolved.   Transfer to tele floor Off insulin gtt HA1C is 12.6 3/5- hyperglycemic this am, given extra insuling ,improving.   #2.  Acute kidney injury.  Chart review indicates her creatinine 1 year ago was 0.70.  Creatinine on admission was 1.40.  Likely related to dehydration in the setting of #1.   Improved  -Hold nephrotoxins -Monitor urine output   #3.  Dehydration- 2/2 decrease po intake and DKA. Continue gentle ivf.   #4.  Generalized weakness/nausea.  Patient received her second Covid vaccine shortly thereafter her intermittent nausea and decreased oral intake started.  Baseline she lives at home.  He did have a recent fall on the ice 2 weeks ago. -see #1.  -zofran prn -PT consult-home PT  #5.  Mild Elevated troponin. HS troponin 21.    Likely mild demand ischemia due to AKI and DKA.     EKG without acute changes.  Patient also asymptomatic  She does have a history of CAD/MI.  Continue BB, statin, aspirin, Imdur . -We will hold ACE inhibitor and HCTZ -monitor on tele   6.  Hypertension. Better controlled -We will hold ACE inhibitor and hydrochlorothiazide  secondary to AKI -We will continue hydralazine, imdur, BB -monitor   #7.  GERD.  Stable at baseline. -Continue PPI  #10.  Mild dementia.  Appears stable at baseline. -Continue home meds    DVT prophylaxis: lovenox  Code Status: full Family Communication: patient Disposition Plan: home in am if blood glucose is stable. Barrier to dc: severe hyperglycemia this am.        LOS: 2 days   Time  spent: 45 minutes with more than 50% COC    Nolberto Hanlon, MD [facility] Pager [phone]  If 7PM-7AM, please contact night-coverage [email or web address] Password [facility] 10/08/2019, 2:46 PM Patient ID: [place]row [place], female   DOB: 17-Sep-1940, 79 y.o.   MRN: [id number]

## 2019-10-08 NOTE — TOC Initial Note (Signed)
Transition of Care [facility]) - Initial/Assessment Note    Patient Details  Name: Vicki [name] MRN: [id number] Date of Birth: Oct 31, 1940  [facility] CM/SW Contact:    Victorino Dike, RN Phone Number: 10/08/2019, 3:33 PM  Clinical Narrative:                   Met with patient in room and discussed [place].  She is agreeable to this.  Referral sent to [facility] at [facility] waiting on response.  Patient has a cane for use in home.  PT recommends RW, patient is agreeable to this.  [employer] will deliver to room.    Expected Discharge Plan: [place] Barriers to Discharge: Continued Medical Work up   Patient Goals and CMS Choice   CMS Medicare.gov Compare Post Acute Care list provided to:: Patient Choice offered to / list presented to : Patient  Expected Discharge Plan and Services Expected Discharge Plan: [place]   Discharge Planning Services: CM Consult Post Acute Care Choice: Durable Medical Equipment, Home Health Living arrangements for the past 2 months: Single Family Home                 DME Arranged: [place]   Date DME Agency Contacted: 10/08/19 Time DME Agency Contacted: [id number] Representative spoke with at DME Agency: [place] Arrangements/Services Living arrangements for the past 2 months: [place] with:: Self Patient language and need for interpreter reviewed:: Yes Do you feel safe going back to the place where you live?: Yes      Need for Family Participation in Patient Care: No (Comment) Care giver support system in place?: Yes (comment) Current home services: DME(cane) Criminal Activity/Legal Involvement Pertinent to Current Situation/Hospitalization: No - Comment as needed  Activities of Daily Living Home Assistive Devices/Equipment: Eyeglasses, Cane (specify quad or straight), Dentures (specify type)(quad, left at home) ADL Screening  (condition at time of admission) Patient's cognitive ability adequate to safely complete daily activities?: Yes Is the patient deaf or have difficulty hearing?: No Does the patient have difficulty seeing, even when wearing glasses/contacts?: No Does the patient have difficulty concentrating, remembering, or making decisions?: No Patient able to express need for assistance with ADLs?: Yes Does the patient have difficulty dressing or bathing?: No Independently performs ADLs?: Yes (appropriate for developmental age) Does the patient have difficulty walking or climbing stairs?: Yes Weakness of Legs: Both Weakness of Arms/Hands: None  Permission Sought/Granted                  Emotional Assessment Appearance:: Appears stated age Attitude/Demeanor/Rapport: Engaged Affect (typically observed): Accepting, Appropriate Orientation: : Oriented to Self, Oriented to Place, Oriented to Situation, Oriented to  Time Alcohol / Substance Use: Not Applicable Psych Involvement: No (comment)  Admission diagnosis:  Weakness [R53.1] 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HCC) [E11.10] Diabetic ketoacidosis without coma associated with diabetes mellitus due to underlying condition ([place]) [E08.10] Patient Active Problem List   Diagnosis Date Noted  . Diabetic ketoacidosis without coma associated with diabetes mellitus due to underlying condition ([place])   . Dehydration 10/06/2019  . Generalized weakness 10/06/2019  . Acute kidney injury ([place]) 10/06/2019  . Hypertension   . Hypoglycemia 12/10/2017  . Mild dementia ([place]) 08/14/2017  . 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 NSTEMI (non-ST elevated myocardial infarction) ([place]) 10/01/2016  .  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 Diabetes ([place]) 09/29/2016  . GERD (gastroesophageal reflux disease) 09/29/2016  . Nausea and vomiting 03/24/2016  . Diffuse abdominal pain 03/24/2016  . Hypokalemia 03/24/2016  . Chest pain 03/24/2016  .  Elevated troponin I level 09/26/2015  . 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[place]) 09/25/2015   PCP:  Adin Hector, MD Pharmacy:   [place], [place] - [address] [place] [address] Phone: [phone] Fax: [phone]     Social Determinants of Health (SDOH) Interventions    Readmission Risk Interventions No flowsheet data found.

## 2019-10-09 ENCOUNTER — Encounter: Payer: Self-pay | Admitting: Internal Medicine

## 2019-10-09 ENCOUNTER — Inpatient Hospital Stay: Payer: Medicare Other

## 2019-10-09 DIAGNOSIS — F0390 Unspecified dementia without behavioral disturbance: Secondary | ICD-10-CM

## 2019-10-09 LAB — BASIC METABOLIC PANEL
Anion gap: 12 (ref 5–15)
BUN: 24 mg/dL — ABNORMAL HIGH (ref 8–23)
CO2: 25 mmol/L (ref 22–32)
Calcium: 9.1 mg/dL (ref 8.9–10.3)
Chloride: 99 mmol/L (ref 98–111)
Creatinine, Ser: 0.89 mg/dL (ref 0.44–1.00)
GFR calc Af Amer: >60 mL/min (ref >60)
GFR calc non Af Amer: >60 mL/min (ref >60)
Glucose, Bld: 320 mg/dL — ABNORMAL HIGH (ref 70–99)
Potassium: 4.6 mmol/L (ref 3.5–5.1)
Sodium: 136 mmol/L (ref 135–145)

## 2019-10-09 LAB — GLUCOSE, CAPILLARY
Glucose-Capillary: 333 mg/dL — ABNORMAL HIGH (ref 70–99)
Glucose-Capillary: 297 mg/dL — ABNORMAL HIGH (ref 70–99)
Glucose-Capillary: 118 mg/dL — ABNORMAL HIGH (ref 70–99)
Glucose-Capillary: 234 mg/dL — ABNORMAL HIGH (ref 70–99)

## 2019-10-09 MED ORDER — INSULIN GLARGINE 100 UNIT/ML ~~LOC~~ SOLN
25.0000 [IU] | Freq: Every day | SUBCUTANEOUS | Status: DC
  Administered 2019-10-10: 25 [IU] via SUBCUTANEOUS
  Filled 2019-10-09 (×2): qty 0.25

## 2019-10-09 MED ORDER — HYDRALAZINE HCL 25 MG PO TABS
25.0000 mg | ORAL_TABLET | Freq: Once | ORAL | Status: AC
  Administered 2019-10-09: 10:00:00 25 mg via ORAL
  Filled 2019-10-09: qty 1

## 2019-10-09 MED ORDER — HYDRALAZINE HCL 25 MG PO TABS
25.0000 mg | ORAL_TABLET | Freq: Three times a day (TID) | ORAL | Status: DC
  Administered 2019-10-09 – 2019-10-10 (×4): 25 mg via ORAL
  Filled 2019-10-09 (×4): qty 1

## 2019-10-09 MED ORDER — INSULIN GLARGINE 100 UNIT/ML ~~LOC~~ SOLN
5.0000 [IU] | Freq: Every day | SUBCUTANEOUS | Status: DC
  Administered 2019-10-09: 5 [IU] via SUBCUTANEOUS
  Filled 2019-10-09 (×2): qty 0.05

## 2019-10-09 MED ORDER — IOHEXOL 350 MG/ML SOLN
75.0000 mL | Freq: Once | INTRAVENOUS | Status: AC | PRN
  Administered 2019-10-09: 75 mL via INTRAVENOUS

## 2019-10-09 NOTE — Progress Notes (Signed)
PROGRESS NOTE    Vicki [name]  CWC:[id number] DOB: 12-01-1940 DOA: 10/06/2019 PCP: Lynnea Ferrier, MD    Brief Narrative:  Vicki [name] is a 79 y.o. female with medical history significant of hypertension, insulin-dependent diabetes, MI, stroke, GERD presents to the emergency department chief complaint generalized weakness nausea and vomiting.  Patient reports no appetite for 2 weeks.  Associated symptoms include nausea with intermittent vomiting.  She states she has not been taking her insulin since she has not been eating and has had no appetite.  She has supplemented with some "shakes".  She also reports she fell 2 weeks ago on the ice and has not felt well since then.  She denies hitting her head or losing consciousness.  She does report she fell on her right hip but has had no pain with ambulation since.  She also reports getting her second Covid vaccination and symptoms starting shortly thereafter.  She denies abdominal pain chest pain palpitations shortness of breath headache dizziness syncope or near syncope.  She denies any dysuria hematuria frequency or urgency.  She denies diarrhea constipation melena or bright red blood per rectum.  She denies any visual disturbances numbness or tingling of extremities.  She denies any lower extremity edema or orthopnea. Work-up in the emergency department reveals a blood sugar of 585, an anion gap of 19 with CO2 of 23, hyponatremia with a sodium of 129, and creatinine 1.49.   Consultants:   None  Procedures:   Antimicrobials:   None   Subjective: No new complaints.  This a.m. sugars again were uncontrolled.  Denies nausea or vomiting or abd pain.    Objective: Vitals:   10/09/19 0456 10/09/19 0800 10/09/19 0943 10/09/19 1147  BP: (!) 180/74 (!) 199/68  (!) 152/78  Pulse: 67 66 72 68  Resp: 15 17  16   Temp: 98.1 F (36.7 C) 98.7 F (37.1 C)  97.9 F (36.6 C)  TempSrc: Oral Oral    SpO2: 98% 100%  100%  Weight: 66.7 kg      Height:        Intake/Output Summary (Last 24 hours) at 10/09/2019 1509 Last data filed at 10/09/2019 1147 Gross per 24 hour  Intake 720 ml  Output 600 ml  Net 120 ml   Filed Weights   10/06/19 1227 10/08/19 1142 10/09/19 0456  Weight: 67 kg 67.2 kg 66.7 kg    Examination:  General exam: Appears calm and comfortable Respiratory system: Clear to auscultation. Respiratory effort normal.  No wheezing or rales Cardiovascular system: S1 & S2 heard, RRR. No JVD, murmurs, rubs, gallops or clicks. Gastrointestinal system: Abdomen is nondistended, soft and nontender. Normal bowel sounds heard. Central nervous system: Alert and oriented. Grossly intact Extremities: No edema Skin: Warm dry Psychiatry: Judgement and insight appear normal. Mood & affect appropriate.     Data Reviewed: I have personally reviewed following labs and imaging studies  CBC: Recent Labs  Lab 10/06/19 0921  WBC 11.2*  NEUTROABS 8.1*  HGB 12.2  HCT 37.7  MCV 93.1  PLT 309   Basic Metabolic Panel: Recent Labs  Lab 10/06/19 1810 10/06/19 2301 10/07/19 0258 10/08/19 0755 10/09/19 0538  NA 134* 135 139 134* 136  K 3.6 3.7 3.5 5.0 4.6  CL 96* 99 103 98 99  CO2 29 28 28 23 25   GLUCOSE 115* 255* 145* 526* 320*  BUN 41* 36* 32* 24* 24*  CREATININE 0.94 1.03* 0.92 0.95 0.89  CALCIUM 9.6 9.1  9.1 9.1 9.1   GFR: Estimated Creatinine Clearance: 47.4 mL/min (by C-G formula based on SCr of 0.89 mg/dL). Liver Function Tests: Recent Labs  Lab 10/06/19 0921  AST 26  ALT 20  ALKPHOS 139*  BILITOT 1.5*  PROT 6.7  ALBUMIN 3.5   No results for input(s): LIPASE, AMYLASE in the last 168 hours. No results for input(s): AMMONIA in the last 168 hours. Coagulation Profile: No results for input(s): INR, PROTIME in the last 168 hours. Cardiac Enzymes: No results for input(s): CKTOTAL, CKMB, CKMBINDEX, TROPONINI in the last 168 hours. BNP (last 3 results) No results for input(s): PROBNP in the last 8760  hours. HbA1C: Recent Labs    10/07/19 0258  HGBA1C 12.6*   CBG: Recent Labs  Lab 10/08/19 1730 10/08/19 1801 10/08/19 2139 10/09/19 0753 10/09/19 1145  GLUCAP 50* 70 168* 333* 297*   Lipid Profile: No results for input(s): CHOL, HDL, LDLCALC, TRIG, CHOLHDL, LDLDIRECT in the last 72 hours. Thyroid Function Tests: No results for input(s): TSH, T4TOTAL, FREET4, T3FREE, THYROIDAB in the last 72 hours. Anemia Panel: No results for input(s): VITAMINB12, FOLATE, FERRITIN, TIBC, IRON, RETICCTPCT in the last 72 hours. Sepsis Labs: No results for input(s): PROCALCITON, LATICACIDVEN in the last 168 hours.  Recent Results (from the past 240 hour(s))  Respiratory Panel by RT PCR (Flu A&B, Covid) - Nasopharyngeal Swab     Status: None   Collection Time: 10/06/19  9:38 AM   Specimen: Nasopharyngeal Swab  Result Value Ref Range Status   SARS Coronavirus 2 by RT PCR NEGATIVE NEGATIVE Final    Comment: (NOTE) SARS-CoV-2 target nucleic acids are NOT DETECTED. The SARS-CoV-2 RNA is generally detectable in upper respiratoy specimens during the acute phase of infection. The lowest concentration of SARS-CoV-2 viral copies this assay can detect is 131 copies/mL. A negative result does not preclude SARS-Cov-2 infection and should not be used as the sole basis for treatment or other patient management decisions. A negative result may occur with  improper specimen collection/handling, submission of specimen other than nasopharyngeal swab, presence of viral mutation(s) within the areas targeted by this assay, and inadequate number of viral copies (<131 copies/mL). A negative result must be combined with clinical observations, patient history, and epidemiological information. The expected result is Negative. Fact Sheet for Patients:  PinkCheek.be Fact Sheet for Healthcare Providers:  GravelBags.it This test is not yet ap proved or cleared  by the [place] FDA and  has been authorized for detection and/or diagnosis of SARS-CoV-2 by FDA under an Emergency Use Authorization (EUA). This EUA will remain  in effect (meaning this test can be used) for the duration of the COVID-19 declaration under Section 564(b)(1) of the Act, 21 U.S.C. section 360bbb-3(b)(1), unless the authorization is terminated or revoked sooner.    Influenza A by PCR NEGATIVE NEGATIVE Final   Influenza B by PCR NEGATIVE NEGATIVE Final    Comment: (NOTE) The Xpert Xpress SARS-CoV-2/FLU/RSV assay is intended as an aid in  the diagnosis of influenza from Nasopharyngeal swab specimens and  should not be used as a sole basis for treatment. Nasal washings and  aspirates are unacceptable for Xpert Xpress SARS-CoV-2/FLU/RSV  testing. Fact Sheet for Patients: PinkCheek.be Fact Sheet for Healthcare Providers: GravelBags.it This test is not yet approved or cleared by the [place] FDA and  has been authorized for detection and/or diagnosis of SARS-CoV-2 by  FDA under an Emergency Use Authorization (EUA). This EUA will remain  in effect (meaning this test can  be used) for the duration of the  Covid-19 declaration under Section 564(b)(1) of the Act, 21  U.S.C. section 360bbb-3(b)(1), unless the authorization is  terminated or revoked. Performed at [facility], [address] [place], [place] [address]          Radiology Studies: No results found.      Scheduled Meds: . aspirin EC  81 mg Oral Daily  . brimonidine  1 drop Both Eyes Daily   And  . timolol  1 drop Both Eyes Daily  . citalopram  20 mg Oral Daily  . docusate sodium  100 mg Oral BID  . donepezil  5 mg Oral QHS  . enoxaparin (LOVENOX) injection  40 mg Subcutaneous Q24H  . hydrALAZINE  25 mg Oral Q8H  . insulin aspart  0-15 Units Subcutaneous TID WC  . [START ON 10/10/2019] insulin glargine  25 Units Subcutaneous  QAC breakfast  . insulin glargine  5 Units Subcutaneous QHS  . isosorbide mononitrate  30 mg Oral Daily  . labetalol  100 mg Oral BID  . loratadine  10 mg Oral Daily  . magnesium oxide  400 mg Oral Daily  . memantine  5 mg Oral QPM  . multivitamin with minerals  1 tablet Oral Daily  . pantoprazole  40 mg Oral Daily  . polyethylene glycol  17 g Oral Daily  . pravastatin  40 mg Oral q1800   Continuous Infusions:   Assessment & Plan:   Principal Problem:   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HCC) Active Problems:   Elevated troponin I level   Nausea and vomiting   Diabetes (HCC)   Mild dementia (HCC)   Dehydration   Generalized weakness   Acute kidney injury (HCC)   Diabetic ketoacidosis without coma associated with diabetes mellitus due to underlying condition (HCC)   #1.  DKA.  Likely related to patient not taking her insulin due to poor appetite intermittent nausea and vomiting.  Started on DKA protocal.- now resolved.   Transfer to tele floor Off insulin gtt HA1C is 12.6 Still hyperglycemic today.  We will add Lantus 5 units nightly   #2.  Acute kidney injury.  Chart review indicates her creatinine 1 year ago was 0.70.  Creatinine on admission was 1.40.  Likely related to dehydration in the setting of #1.   Improved  -Hold nephrotoxins -Monitor urine output   #3.  Dehydration- 2/2 decrease po intake and DKA. Improved with ivf.   #4.  Generalized weakness/nausea.  Patient received her second Covid vaccine shortly thereafter her intermittent nausea and decreased oral intake started.  Baseline she lives at home.  He did have a recent fall on the ice 2 weeks ago. -see #1.  -zofran prn -PT consult-home PT  #5.  Mild Elevated troponin. HS troponin 21.    Likely mild demand ischemia due to AKI and DKA.     EKG without acute changes.  Patient also asymptomatic  She does have a history of CAD/MI.  Continue BB, statin, aspirin, Imdur . -We will hold ACE inhibitor and HCTZ -monitor on  tele   6.  Hypertension. Better controlled -We will hold ACE inhibitor and hydrochlorothiazide secondary to AKI -We will continue hydralazine, imdur, BB. Increased hydralazine to 3 times daily 25 mg -monitor   #7.  GERD.  Stable at baseline. -Continue PPI  #10.  Mild dementia.  Appears stable at baseline. -Continue home meds    DVT prophylaxis: lovenox  Code Status: full Family Communication: patient  Disposition Plan: home in am if blood glucose is stable as still is not controlled. Barrier to dc: severe hyperglycemia this am.  Made insulin changes and if blood glucose levels in a.m. or stable will discharge home       LOS: 3 days   Time spent: 45 minutes with more than 50% COC    Lynn Ito, MD [facility] Pager [phone]  If 7PM-7AM, please contact night-coverage [email or web address] Password [facility] 10/09/2019, 3:09 PM Patient ID: Vicki [name], female   DOB: 10-23-1940, 79 y.o.   MRN: [id number]

## 2019-10-09 NOTE — Progress Notes (Signed)
Physical Therapy Treatment Patient Details Name: Vicki [name] MRN: [id number] DOB: [birth year] Today's Date: 10/09/2019    History of Present Illness  79 y.o. female with medical history significant of hypertension, insulin-dependent diabetes, MI, stroke, GERD presents to the emergency department chief complaint generalized weakness nausea and vomiting.  Patient reports no appetite for 2 weeks.  Associated symptoms include nausea with intermittent vomiting.  She states she has not been taking her insulin since she has not been eating and has had no appetite.  Pt had fall on ice a few weeks ago, landing on R LE and has had pain since (reports she had x-rays that were negative)    PT Comments    Pt found in supine on bed. Pt stated that she is generally feeling better, though not as great at time of treatment. No reports of pain this date, pt eager to get up and moving. Pt was independent with bed mobility and modI with standing and ambulation using RW and gait belt CGA. Pt ambulated with slight antalgic gait pattern due to R knee instability, but able to complete 180 feet with moderate fatigue, required a seated rest break before continuing; pt educated on safety awareness with using RW to ensure both hands are on handles whenever walking. Pt negotiated 12 stairs with CGA, initial 6 steps using both rails and final 6 steps using rail on L side; educated on stepping strategies to minimize R knee instability and prevent falls. HR elevated to 90-95 bpm post-ambulation and SpO2 consistent at 98% through treatment. Pt returned to supine in bed with call bell within reach. PT endorses current plan for treatment and D/C, confident that pt can navigate residence with current level of mobility.PT recommends DC home with HHPT to follow. Acute PT will continue per POC.    Follow Up Recommendations  Home health PT     Equipment Recommendations  Rolling walker with 5" wheels    Recommendations for Other  Services       Precautions / Restrictions Precautions Precautions: Fall Restrictions Weight Bearing Restrictions: No    Mobility  Bed Mobility Overal bed mobility: Independent             General bed mobility comments: Easily transfers to and from sitting  Transfers Overall transfer level: Independent Equipment used: Rolling walker (2 wheeled) Transfers: Sit to/from Stand Sit to Stand: Supervision         General transfer comment: Pt able to rise with good confidence and safety  Ambulation/Gait Ambulation/Gait assistance: Modified independent (Device/Increase time) Gait Distance (Feet): 180 Feet Assistive device: Rolling walker (2 wheeled) Gait Pattern/deviations: Step-through pattern;Antalgic;Narrow base of support;Scissoring Gait velocity: WNL(Educated to slow down to reduce risk of fall)   General Gait Details: Pt able to walk with minor limp on RLE that did not limit distance travelled this date using RW. No LOB, but patient demonstrated poor safety awareness with hand placement on RW with multitasking during ambulation; PT educated pt to keep both hands on RW when walking.    Stairs Stairs: Yes Stairs assistance: Min guard(Hand hold assist up and down stairs) Stair Management: One rail Left;Two rails(Both rails utilized followed by one rail ) Number of Stairs: 12 General stair comments: Pt able to negotiate stairs with confidence and no LOB. Pt educated to step up with LLE and down with RLE to reduce risk of R knee buckling and prevent falls. Also educated that she would need caregiver to carry RW up/down stairs for her.  Wheelchair Mobility    Modified Rankin (Stroke Patients Only)       Balance Overall balance assessment: Modified Independent                                          Cognition Arousal/Alertness: Awake/alert Behavior During Therapy: WFL for tasks assessed/performed Overall Cognitive Status: Within Functional  Limits for tasks assessed                                        Exercises      General Comments        Pertinent Vitals/Pain Pain Assessment: No/denies pain Faces Pain Scale: No hurt    Home Living                      Prior Function            PT Goals (current goals can now be found in the care plan section) Acute Rehab PT Goals Patient Stated Goal: get back to walking better and control her blood sugars Progress towards PT goals: Progressing toward goals    Frequency    Min 2X/week      PT Plan Current plan remains appropriate    Co-evaluation     PT goals addressed during session: Mobility/safety with mobility;Balance;Proper use of DME        AM-PAC PT "6 Clicks" Mobility   Outcome Measure  Help needed turning from your back to your side while in a flat bed without using bedrails?: None Help needed moving from lying on your back to sitting on the side of a flat bed without using bedrails?: None Help needed moving to and from a bed to a chair (including a wheelchair)?: None Help needed standing up from a chair using your arms (e.g., wheelchair or bedside chair)?: None Help needed to walk in hospital room?: A Little Help needed climbing 3-5 steps with a railing? : A Little 6 Click Score: 22    End of Session Equipment Utilized During Treatment: Gait belt Activity Tolerance: Patient tolerated treatment well Patient left: in bed;with call bell/phone within reach Nurse Communication: Mobility status PT Visit Diagnosis: Muscle weakness (generalized) (M62.81);Difficulty in walking, not elsewhere classified (R26.2);Pain     Time: 1359-1420 PT Time Calculation (min) (ACUTE ONLY): 21 min  Charges:  $Gait Training: 8-22 mins                     Jetta Lout PTA 10/09/19, 3:37 PM

## 2019-10-09 NOTE — Progress Notes (Signed)
Inpatient Diabetes Program Recommendations  AACE/ADA: New Consensus Statement on Inpatient Glycemic Control (2015)  Target Ranges:  Prepandial:   less than 140 mg/dL      Peak postprandial:   less than 180 mg/dL (1-2 hours)      Critically ill patients:  140 - 180 mg/dL   Lab Results  Component Value Date   GLUCAP 333 (H) 10/09/2019   HGBA1C 12.6 (H) 10/07/2019    Review of Glycemic Control Results for [name], Vicki [name] (MRN [id number]) as of 10/09/2019 10:01  Ref. Range 10/08/2019 18:01 10/08/2019 21:39 10/09/2019 07:53  Glucose-Capillary Latest Ref Range: 70 - 99 mg/dL 70 409 (H) 811 (H)   Diabetes history:DM1 (per Dr. Pricilla Handler office notes)  Home DM Meds: Toujeo 30 units QHS                              Humalog 0-12 units with meals and snacks  Current Orders: Lantus 20 units Daily                            Novolog Moderate Correction Scale/ SSI (0-15 units) TID AC  Noted hypoglycemia of 50 mg/dL yesterday, however patient received 20 units of short acting insulin d/t hyperglycemia. Assuming patient needs carbohydrate coverage, as patient is type 1 DM.   Consider the following:  Start Novolog Meal Coverage: Novolog 4 units TID with meals  (Please add the following Hold Parameters: Hold if pt eats <50% of meal, Hold if pt NPO)  Thanks, Vicki Rave, MSN, RNC-OB Diabetes Coordinator [phone] (8a-5p)

## 2019-10-09 NOTE — Progress Notes (Addendum)
    BRIEF OVERNIGHT PROGRESS REPORT  SUBJECTIVE: Patient complained of sudden loss of vision in the right eye  that appears as a "black curtain coming down vertically into the field of vision.  Patient describes episode as painless loss of vision in the right eye without associated vertigo/dizziness. No symptoms of eye redness or pain and tearing associated with visual loss (intermittent angle closure glaucoma). Patient states nothing seem to precipitate episode such as postural changes or exercise, loss of vision when eyes are moved into certain positions of gaze (gaze-evoked amaurosis). No other focal motor or sensory deficit.  OBJECTIVE: On arrival to the bedside, she was afebrile with blood pressure 140/83 mm Hg and pulse rate 99 beats/min. There were no focal neurological deficits; she was alert and oriented x4, and c/o blurry vision in her right eye. Mild ptosis noted.  ASSESSMENT: 79 y.o. female with medical history significant of hypertension, insulin-dependent diabetes, MI, stroke, GERD admitted for evaluation of weakness, n/v and found to be in DKA.  PLAN:  1.Transient monocular vision loss - Initial concerns for ocular ischemic syndrome (TIA, Amaurosis fugax) in a patient with stroke risk factors. - Stat CTA head and neck obtained which did not show evidence of large vessel occlusion or intracranial abnormality. - cannot r/o diabetic retinopathy in a patient with hx of uncontrolled DM. She has an appointment with her pcp who had mentioned referral to ophthalmologist for further evaluation. - Continue medical management with Aspirin 81 and intensive management of vascular risk factor to keep systolic BP (SBP) <130 mm Hg and low density lipoprotein (LDL) <70 mg/dl, and lifestyle modification.  - Consult to Diabetes co-ordinator     Webb Silversmith, DNP, CCRN, FNP-C [facility] Nurse Practitioner Between 7pm to 7am - Pager [phone]  After 7am go to [email or web address] -  password:[email or web address] [facility]  [employer]  [phone]

## 2019-10-09 NOTE — TOC Progression Note (Signed)
Transition of Care [facility]) - Progression Note    Patient Details  Name: Vicki [name] MRN: [id number] Date of Birth: [birth year]  [facility] CM/SW Contact  Dominic Pea, LCSW Phone Number: 10/09/2019, 5:20 PM  Clinical Narrative:    Sherron Monday with Barbara Cower with [facility]-unable to accept patient. Spoke with Mellissa Kohut with [facility] who is able to accept patient for home health physical therapy. Patient with continued medical workup. TOC continuing to follow for discharge needs.    Expected Discharge Plan: Home w Home Health Services Barriers to Discharge: Continued Medical Work up  Expected Discharge Plan and Services Expected Discharge Plan: Home w Home Health Services   Discharge Planning Services: CM Consult Post Acute Care Choice: Durable Medical Equipment, Home Health Living arrangements for the past 2 months: Single Family Home                 DME Arranged: Walker rolling   Date DME Agency Contacted: 10/08/19 Time DME Agency Contacted: [phone] Representative spoke with at DME Agency: Nida Boatman             Social Determinants of Health (SDOH) Interventions    Readmission Risk Interventions No flowsheet data found.

## 2019-10-09 NOTE — Progress Notes (Signed)
Patient c/o her vision is suddenly blurry in the right eye, like a filmy curtain came down over the eye.VSS. Face is symmetrical, good grip strength present in both hands, no trouble lifting any of her limbs against gravity. She is still A&Ox4 with no complaints of headache or dizziness. NP Ouma aware, to order CT head and neck.

## 2019-10-10 LAB — GLUCOSE, CAPILLARY
Glucose-Capillary: 561 mg/dL (ref 70–99)
Glucose-Capillary: 219 mg/dL — ABNORMAL HIGH (ref 70–99)
Glucose-Capillary: 189 mg/dL — ABNORMAL HIGH (ref 70–99)
Glucose-Capillary: 77 mg/dL (ref 70–99)
Glucose-Capillary: 82 mg/dL (ref 70–99)
Glucose-Capillary: 332 mg/dL — ABNORMAL HIGH (ref 70–99)
Glucose-Capillary: 140 mg/dL — ABNORMAL HIGH (ref 70–99)
Glucose-Capillary: 230 mg/dL — ABNORMAL HIGH (ref 70–99)
Glucose-Capillary: 187 mg/dL — ABNORMAL HIGH (ref 70–99)

## 2019-10-10 LAB — BASIC METABOLIC PANEL
Anion gap: 7 (ref 5–15)
BUN: 21 mg/dL (ref 8–23)
CO2: 27 mmol/L (ref 22–32)
Calcium: 9 mg/dL (ref 8.9–10.3)
Chloride: 103 mmol/L (ref 98–111)
Creatinine, Ser: 0.8 mg/dL (ref 0.44–1.00)
GFR calc Af Amer: >60 mL/min (ref >60)
GFR calc non Af Amer: >60 mL/min (ref >60)
Glucose, Bld: 213 mg/dL — ABNORMAL HIGH (ref 70–99)
Potassium: 4.4 mmol/L (ref 3.5–5.1)
Sodium: 137 mmol/L (ref 135–145)

## 2019-10-10 NOTE — Discharge Summary (Signed)
Vicki [name] ZOX:[id number] DOB: [birth year] DOA: 10/06/2019  PCP: Lynnea FerrierKlein, Bert J III, MD  Admit date: 10/06/2019 Discharge date: 10/10/2019  Admitted From: Home Disposition: Home  Recommendations for Outpatient Follow-up:  1. Follow up with PCP in 1 week 2. Please obtain BMP/CBC in one week 3. Follow-up with ophthalmology next week.  Home Health: Yes   Discharge Condition:Stable CODE STATUS: Full Diet recommendation: Heart Healthy / Carb Modified   Brief/Interim Summary: Per HPI: Vicki HippsRoberta [name] is a 79 y.o. female with medical history significant of hypertension, insulin-dependent diabetes, MI, stroke, GERD presents to the emergency department chief complaint generalized weakness nausea and vomiting.  Patient reported no appetite for 2 weeks.  Associated symptoms include nausea with intermittent vomiting.  She stated she has not been taking her insulin since she has not been eating and has had no appetite.  She has supplemented with some "shakes".  Work-up in the emergency department reveals a blood sugar of 585, an anion gap of 19 with CO2 of 23, hyponatremia with a sodium of 129, and creatinine 1.49.  Urinalysis and chest x-ray unremarkable.She was admitted for treatment of DKA and placed on DKA protocal. Her sugars became under control.  She was found with acute kidney injury likely from DKA and prerenal from dehydration.  She responded with IV fluids.Her ACEI HCTZ was held due to AKI, will need to follow-up with her PCP to discuss further medication management.  Her blood pressure is better controlled.  Blood glucose became more stable .last night patient had complained of right-sided mild blurry vision and coming down vertically.  There was no other symptoms or neurologic deficit.  A CTA of the head and neck up was obtained with results below but did not show evidence of large vessel occlusion or intracranial acute abnormality.  She was supposed to follow-up with her ophthalmology last week  but she was in the hospital.  She was told to make sure to follow-up with her ophthalmologist next week.  She is stable to be discharged home.  Discharge Diagnoses:  Principal Problem:   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HCC) Active Problems:   Elevated troponin I level   Nausea and vomiting   Diabetes (HCC)   Mild dementia (HCC)   Dehydration   Generalized weakness   Acute kidney injury (HCC)   Diabetic ketoacidosis without coma associated with diabetes mellitus due to underlying condition [facility](HCC)    Discharge Instructions  Discharge Instructions    Call MD for:  difficulty breathing, headache or visual disturbances   Complete by: As directed    Call MD for:  persistant dizziness or light-headedness   Complete by: As directed    Diet - low sodium heart healthy   Complete by: As directed    Discharge instructions   Complete by: As directed    Follow-up with primary care in 1 week for diabetic management Follow-up with ophthalmology for her eye issue   Increase activity slowly   Complete by: As directed      Allergies as of 10/10/2019   No Known Allergies     Medication List    STOP taking these medications   cephALEXin 500 MG capsule Commonly known as: Keflex   hydrochlorothiazide 12.5 MG tablet Commonly known as: HYDRODIURIL   loperamide 2 MG tablet Commonly known as: Imodium A-D   ondansetron 4 MG disintegrating tablet Commonly known as: Zofran ODT   quinapril 20 MG tablet Commonly known as: ACCUPRIL     TAKE these medications  acetaminophen 500 MG tablet Commonly known as: TYLENOL Take 1,000-1,500 mg by mouth every 6 (six) hours as needed for mild pain or headache.   aspirin 81 MG EC tablet Take 1 tablet (81 mg total) by mouth daily.   Centrum Adults Tabs Take 1 tablet by mouth every morning.   citalopram 20 MG tablet Commonly known as: CELEXA Take 20 mg by mouth daily.   Combigan 0.2-0.5 % ophthalmic solution Generic drug: brimonidine-timolol Place 1 drop into  both eyes daily.   docusate sodium 100 MG capsule Commonly known as: COLACE Take 1 capsule (100 mg total) by mouth 2 (two) times daily. What changed: when to take this   donepezil 5 MG tablet Commonly known as: ARICEPT Take 5 mg by mouth at bedtime.   fluticasone 50 MCG/ACT nasal spray Commonly known as: FLONASE Place 2 sprays into both nostrils daily.   hydrALAZINE 50 MG tablet Commonly known as: APRESOLINE Take 1 tablet (50 mg total) by mouth every 8 (eight) hours. What changed:   how much to take  when to take this   Insulin Glargine 300 UNIT/ML Sopn Commonly known as: Toujeo SoloStar Inject 30 Units into the skin at bedtime.   insulin lispro 100 UNIT/ML injection Commonly known as: HUMALOG Inject 0.04 mLs (4 Units total) into the skin 3 (three) times daily before meals. Pt uses per sliding scale. What changed: how much to take   isosorbide mononitrate 30 MG 24 hr tablet Commonly known as: IMDUR Take 1 tablet (30 mg total) by mouth daily.   labetalol 100 MG tablet Commonly known as: NORMODYNE Take 100 mg by mouth 2 (two) times daily.   loratadine 10 MG tablet Commonly known as: CLARITIN Take 10 mg by mouth daily.   lovastatin 40 MG tablet Commonly known as: MEVACOR Take 40 mg by mouth at bedtime.   magnesium oxide 400 MG tablet Commonly known as: MAG-OX Take 400 mg by mouth daily.   memantine 5 MG tablet Commonly known as: NAMENDA Take 5 mg by mouth every evening.   omeprazole 20 MG capsule Commonly known as: PriLOSEC Take 1 capsule (20 mg total) by mouth daily.   polyethylene glycol 17 g packet Commonly known as: MIRALAX / GLYCOLAX Take 17 g by mouth daily.      Follow-up Information    Curtis SitesKlein, Bert J III, MD Follow up in 1 week(s).   Specialty: Internal Medicine Why: needs diabetic adjustment Contact information: [address] [facility] [place] [place] [id number] [phone]          No Known  Allergies  Consultations: none  Procedures/Studies: CT ANGIO HEAD W OR WO CONTRAST  Result Date: 10/09/2019 CLINICAL DATA:  Vision loss, monocular. Additional provided: Altered mental status today. EXAM: CT ANGIOGRAPHY HEAD AND NECK TECHNIQUE: Multidetector CT imaging of the head and neck was performed using the standard protocol during bolus administration of intravenous contrast. Multiplanar CT image reconstructions and MIPs were obtained to evaluate the vascular anatomy. Carotid stenosis measurements (when applicable) are obtained utilizing NASCET criteria, using the distal internal carotid diameter as the denominator. CONTRAST:  75mL OMNIPAQUE IOHEXOL 350 MG/ML SOLN COMPARISON:  Brain MRI 10/02/2018, head CT 03/12/2018. FINDINGS: CT HEAD FINDINGS Brain: There is no evidence of acute intracranial hemorrhage, intracranial mass, midline shift or extra-axial fluid collection.No demarcated cortical infarction. Similar appearance of ill-defined hypoattenuation within the cerebral white matter which is nonspecific, but consistent with chronic small vessel ischemic disease. Stable, moderate generalized parenchymal atrophy. Vascular: No hyperdense vessel.  Atherosclerotic calcifications. Skull: Normal. Negative for fracture or focal lesion. Sinuses: Minimal partial opacification of right ethmoid air cells. No significant mastoid effusion. Orbits: Visualized orbits demonstrate no acute abnormality. Review of the MIP images confirms the above findings CTA NECK FINDINGS Aortic arch: Standard aortic branching. Minimal calcified plaque within the visualized aortic arch and proximal major branch vessels of the neck. No significant innominate or proximal subclavian artery stenosis. Right carotid system: CCA and ICA patent within the neck without stenosis. Tortuosity of the mid to distal cervical ICA. Minimal mixed plaque within the carotid bifurcation. Left carotid system: CCA and ICA patent within the neck without  stenosis. Tortuosity of the distal cervical ICA. Vertebral arteries: Codominant and patent within the neck without stenosis. Skeleton: No acute bony abnormality or aggressive osseous lesion. Cervical spondylosis with multilevel posterior disc osteophytes, uncovertebral and facet hypertrophy. Other neck: No neck mass or cervical lymphadenopathy. Subcentimeter right thyroid lobe nodule not meeting consensus criteria for ultrasound follow-up. Upper chest: No consolidation within the imaged lung apices. Review of the MIP images confirms the above findings CTA HEAD FINDINGS Anterior circulation: The intracranial internal carotid arteries are patent bilaterally with calcified plaque. Moderate stenosis within the paraclinoid right ICA. Mild to moderate stenosis within the paraclinoid left ICA. Otherwise no more than mild stenosis within these vessels. The M1 middle cerebral arteries are patent without significant stenosis. No M2 proximal branch occlusion or high-grade proximal stenosis is identified. The anterior cerebral arteries are patent without high-grade proximal stenosis. No intracranial aneurysm is identified. Posterior circulation: The intracranial vertebral arteries are patent without significant stenosis. The basilar artery is patent without significant stenosis. The bilateral posterior cerebral arteries are patent without significant proximal stenosis. Small posterior communicating arteries are present bilaterally. Venous sinuses: Within limitations of contrast timing, no convincing thrombus. Anatomic variants: None significant. Other: 5 mm extra-axial enhancing focus along the right frontal operculum which may reflect a tiny incidental meningioma (series 11, image 236). Review of the MIP images confirms the above findings IMPRESSION: CT head: 1. No evidence of acute intracranial abnormality 2. Stable generalized parenchymal atrophy and chronic small vessel ischemic disease. 3. Mild partial opacification of  right ethmoid air cells. CTA neck: The bilateral common carotid, internal carotid and vertebral arteries are patent within the neck without significant stenosis. CTA head: 1. No intracranial large vessel occlusion. 2. Calcified plaque within the intracranial internal carotid arteries. Moderate stenosis within the paraclinoid right ICA. Mild-to-moderate stenosis within the paraclinoid left ICA. 3. 5 mm extra-axial enhancing focus along the right frontal operculum which may reflect a tiny incidental meningioma. Electronically Signed   By: Jackey Loge DO   On: 10/09/2019 21:07   DG Chest 1 View  Result Date: 10/06/2019 CLINICAL DATA:  Weakness. Hyperglycemia. EXAM: CHEST  1 VIEW COMPARISON:  12/21/2018 FINDINGS: Heart size appears normal. There is no pleural effusion or edema. No airspace opacifications. The visualized osseous structures are unremarkable. IMPRESSION: No acute cardiopulmonary abnormalities. Electronically Signed   By: Signa Kell M.D.   On: 10/06/2019 10:14   CT ANGIO NECK W OR WO CONTRAST  Result Date: 10/09/2019 CLINICAL DATA:  Vision loss, monocular. Additional provided: Altered mental status today. EXAM: CT ANGIOGRAPHY HEAD AND NECK TECHNIQUE: Multidetector CT imaging of the head and neck was performed using the standard protocol during bolus administration of intravenous contrast. Multiplanar CT image reconstructions and MIPs were obtained to evaluate the vascular anatomy. Carotid stenosis measurements (when applicable) are obtained utilizing NASCET criteria, using the distal internal carotid  diameter as the denominator. CONTRAST:  104mL OMNIPAQUE IOHEXOL 350 MG/ML SOLN COMPARISON:  Brain MRI 10/02/2018, head CT 03/12/2018. FINDINGS: CT HEAD FINDINGS Brain: There is no evidence of acute intracranial hemorrhage, intracranial mass, midline shift or extra-axial fluid collection.No demarcated cortical infarction. Similar appearance of ill-defined hypoattenuation within the cerebral white  matter which is nonspecific, but consistent with chronic small vessel ischemic disease. Stable, moderate generalized parenchymal atrophy. Vascular: No hyperdense vessel.  Atherosclerotic calcifications. Skull: Normal. Negative for fracture or focal lesion. Sinuses: Minimal partial opacification of right ethmoid air cells. No significant mastoid effusion. Orbits: Visualized orbits demonstrate no acute abnormality. Review of the MIP images confirms the above findings CTA NECK FINDINGS Aortic arch: Standard aortic branching. Minimal calcified plaque within the visualized aortic arch and proximal major branch vessels of the neck. No significant innominate or proximal subclavian artery stenosis. Right carotid system: CCA and ICA patent within the neck without stenosis. Tortuosity of the mid to distal cervical ICA. Minimal mixed plaque within the carotid bifurcation. Left carotid system: CCA and ICA patent within the neck without stenosis. Tortuosity of the distal cervical ICA. Vertebral arteries: Codominant and patent within the neck without stenosis. Skeleton: No acute bony abnormality or aggressive osseous lesion. Cervical spondylosis with multilevel posterior disc osteophytes, uncovertebral and facet hypertrophy. Other neck: No neck mass or cervical lymphadenopathy. Subcentimeter right thyroid lobe nodule not meeting consensus criteria for ultrasound follow-up. Upper chest: No consolidation within the imaged lung apices. Review of the MIP images confirms the above findings CTA HEAD FINDINGS Anterior circulation: The intracranial internal carotid arteries are patent bilaterally with calcified plaque. Moderate stenosis within the paraclinoid right ICA. Mild to moderate stenosis within the paraclinoid left ICA. Otherwise no more than mild stenosis within these vessels. The M1 middle cerebral arteries are patent without significant stenosis. No M2 proximal branch occlusion or high-grade proximal stenosis is identified.  The anterior cerebral arteries are patent without high-grade proximal stenosis. No intracranial aneurysm is identified. Posterior circulation: The intracranial vertebral arteries are patent without significant stenosis. The basilar artery is patent without significant stenosis. The bilateral posterior cerebral arteries are patent without significant proximal stenosis. Small posterior communicating arteries are present bilaterally. Venous sinuses: Within limitations of contrast timing, no convincing thrombus. Anatomic variants: None significant. Other: 5 mm extra-axial enhancing focus along the right frontal operculum which may reflect a tiny incidental meningioma (series 11, image 236). Review of the MIP images confirms the above findings IMPRESSION: CT head: 1. No evidence of acute intracranial abnormality 2. Stable generalized parenchymal atrophy and chronic small vessel ischemic disease. 3. Mild partial opacification of right ethmoid air cells. CTA neck: The bilateral common carotid, internal carotid and vertebral arteries are patent within the neck without significant stenosis. CTA head: 1. No intracranial large vessel occlusion. 2. Calcified plaque within the intracranial internal carotid arteries. Moderate stenosis within the paraclinoid right ICA. Mild-to-moderate stenosis within the paraclinoid left ICA. 3. 5 mm extra-axial enhancing focus along the right frontal operculum which may reflect a tiny incidental meningioma. Electronically Signed   By: Jackey Loge DO   On: 10/09/2019 21:07       Subjective: Patient has no complaints this AM.  She still has a little bit of blurry vision but much improved.  Discharge Exam: Vitals:   10/10/19 0756 10/10/19 1219  BP: (!) 136/91 109/68  Pulse: 82 76  Resp: 18 18  Temp: 98.3 F (36.8 C) 98.3 F (36.8 C)  SpO2: 100% 100%   Vitals:  10/09/19 2138 10/10/19 0417 10/10/19 0756 10/10/19 1219  BP: (!) 151/61 138/61 (!) 136/91 109/68  Pulse: 73 67 82  76  Resp:  14 18 18   Temp:  98.7 F (37.1 C) 98.3 F (36.8 C) 98.3 F (36.8 C)  TempSrc:  Oral    SpO2: 100% 100% 100% 100%  Weight:  67.4 kg    Height:        General: Pt is alert, awake, not in acute distress Cardiovascular: RRR, S1/S2 +, no rubs, no gallops Respiratory: CTA bilaterally, no wheezing, no rhonchi Abdominal: Soft, NT, ND, bowel sounds + Extremities: no edema, no cyanosis Neuro exam alert oriented x3.  Eye movement following commands and objects are good.  No neurologic deficits noted.    The results of significant diagnostics from this hospitalization (including imaging, microbiology, ancillary and laboratory) are listed below for reference.     Microbiology: Recent Results (from the past 240 hour(s))  Respiratory Panel by RT PCR (Flu A&B, Covid) - Nasopharyngeal Swab     Status: None   Collection Time: 10/06/19  9:38 AM   Specimen: Nasopharyngeal Swab  Result Value Ref Range Status   SARS Coronavirus 2 by RT PCR NEGATIVE NEGATIVE Final    Comment: (NOTE) SARS-CoV-2 target nucleic acids are NOT DETECTED. The SARS-CoV-2 RNA is generally detectable in upper respiratoy specimens during the acute phase of infection. The lowest concentration of SARS-CoV-2 viral copies this assay can detect is 131 copies/mL. A negative result does not preclude SARS-Cov-2 infection and should not be used as the sole basis for treatment or other patient management decisions. A negative result may occur with  improper specimen collection/handling, submission of specimen other than nasopharyngeal swab, presence of viral mutation(s) within the areas targeted by this assay, and inadequate number of viral copies (<131 copies/mL). A negative result must be combined with clinical observations, patient history, and epidemiological information. The expected result is Negative. Fact Sheet for Patients:  12/06/19 Fact Sheet for Healthcare Providers:   https://www.moore.com/ This test is not yet ap proved or cleared by the https://www.young.biz/ FDA and  has been authorized for detection and/or diagnosis of SARS-CoV-2 by FDA under an Emergency Use Authorization (EUA). This EUA will remain  in effect (meaning this test can be used) for the duration of the COVID-19 declaration under Section 564(b)(1) of the Act, 21 U.S.C. section 360bbb-3(b)(1), unless the authorization is terminated or revoked sooner.    Influenza A by PCR NEGATIVE NEGATIVE Final   Influenza B by PCR NEGATIVE NEGATIVE Final    Comment: (NOTE) The Xpert Xpress SARS-CoV-2/FLU/RSV assay is intended as an aid in  the diagnosis of influenza from Nasopharyngeal swab specimens and  should not be used as a sole basis for treatment. Nasal washings and  aspirates are unacceptable for Xpert Xpress SARS-CoV-2/FLU/RSV  testing. Fact Sheet for Patients: [place] Fact Sheet for Healthcare Providers: https://www.moore.com/ This test is not yet approved or cleared by the https://www.young.biz/ FDA and  has been authorized for detection and/or diagnosis of SARS-CoV-2 by  FDA under an Emergency Use Authorization (EUA). This EUA will remain  in effect (meaning this test can be used) for the duration of the  Covid-19 declaration under Section 564(b)(1) of the Act, 21  U.S.C. section 360bbb-3(b)(1), unless the authorization is  terminated or revoked. Performed at [facility], [address] [place], [place]      Labs: BNP (last 3 results) No results for input(s): BNP in the last 8760 hours.  Basic Metabolic Panel: Recent Labs  Lab 10/06/19 2301 10/07/19 0258 10/08/19 0755 10/09/19 0538 10/10/19 0500  NA 135 139 134* 136 137  K 3.7 3.5 5.0 4.6 4.4  CL 99 103 98 99 103  CO2 28 28 23 25 27   GLUCOSE 255* 145* 526* 320* 213*  BUN 36* 32* 24* 24* 21  CREATININE 1.03* 0.92 0.95 0.89 0.80  CALCIUM 9.1  9.1 9.1 9.1 9.0   Liver Function Tests: Recent Labs  Lab 10/06/19 0921  AST 26  ALT 20  ALKPHOS 139*  BILITOT 1.5*  PROT 6.7  ALBUMIN 3.5   No results for input(s): LIPASE, AMYLASE in the last 168 hours. No results for input(s): AMMONIA in the last 168 hours. CBC: Recent Labs  Lab 10/06/19 0921  WBC 11.2*  NEUTROABS 8.1*  HGB 12.2  HCT 37.7  MCV 93.1  PLT 309   Cardiac Enzymes: No results for input(s): CKTOTAL, CKMB, CKMBINDEX, TROPONINI in the last 168 hours. BNP: Invalid input(s): POCBNP CBG: Recent Labs  Lab 10/09/19 1145 10/09/19 1730 10/09/19 2051 10/10/19 0755 10/10/19 1218  GLUCAP 297* 118* 234* 230* 187*   D-Dimer No results for input(s): DDIMER in the last 72 hours. Hgb A1c No results for input(s): HGBA1C in the last 72 hours. Lipid Profile No results for input(s): CHOL, HDL, LDLCALC, TRIG, CHOLHDL, LDLDIRECT in the last 72 hours. Thyroid function studies No results for input(s): TSH, T4TOTAL, T3FREE, THYROIDAB in the last 72 hours.  Invalid input(s): FREET3 Anemia work up No results for input(s): VITAMINB12, FOLATE, FERRITIN, TIBC, IRON, RETICCTPCT in the last 72 hours. Urinalysis    Component Value Date/Time   COLORURINE YELLOW (A) 10/06/2019 0937   APPEARANCEUR CLEAR (A) 10/06/2019 0937   APPEARANCEUR Hazy 04/04/2012 2050   LABSPEC 1.017 10/06/2019 0937   LABSPEC 1.017 04/04/2012 2050   PHURINE 5.0 10/06/2019 0937   GLUCOSEU >=500 (A) 10/06/2019 0937   GLUCOSEU >=500 04/04/2012 2050   HGBUR NEGATIVE 10/06/2019 0937   BILIRUBINUR NEGATIVE 10/06/2019 0937   BILIRUBINUR Negative 04/04/2012 2050   KETONESUR 20 (A) 10/06/2019 0937   PROTEINUR NEGATIVE 10/06/2019 0937   NITRITE NEGATIVE 10/06/2019 0937   LEUKOCYTESUR NEGATIVE 10/06/2019 0937   LEUKOCYTESUR Negative 04/04/2012 2050   Sepsis Labs Invalid input(s): PROCALCITONIN,  WBC,  LACTICIDVEN Microbiology Recent Results (from the past 240 hour(s))  Respiratory Panel by RT PCR (Flu  A&B, Covid) - Nasopharyngeal Swab     Status: None   Collection Time: 10/06/19  9:38 AM   Specimen: Nasopharyngeal Swab  Result Value Ref Range Status   SARS Coronavirus 2 by RT PCR NEGATIVE NEGATIVE Final    Comment: (NOTE) SARS-CoV-2 target nucleic acids are NOT DETECTED. The SARS-CoV-2 RNA is generally detectable in upper respiratoy specimens during the acute phase of infection. The lowest concentration of SARS-CoV-2 viral copies this assay can detect is 131 copies/mL. A negative result does not preclude SARS-Cov-2 infection and should not be used as the sole basis for treatment or other patient management decisions. A negative result may occur with  improper specimen collection/handling, submission of specimen other than nasopharyngeal swab, presence of viral mutation(s) within the areas targeted by this assay, and inadequate number of viral copies (<131 copies/mL). A negative result must be combined with clinical observations, patient history, and epidemiological information. The expected result is Negative. Fact Sheet for Patients:  PinkCheek.be Fact Sheet for Healthcare Providers:  GravelBags.it This test is not yet ap proved or cleared by the [place] FDA and  has  been authorized for detection and/or diagnosis of SARS-CoV-2 by FDA under an Emergency Use Authorization (EUA). This EUA will remain  in effect (meaning this test can be used) for the duration of the COVID-19 declaration under Section 564(b)(1) of the Act, 21 U.S.C. section 360bbb-3(b)(1), unless the authorization is terminated or revoked sooner.    Influenza A by PCR NEGATIVE NEGATIVE Final   Influenza B by PCR NEGATIVE NEGATIVE Final    Comment: (NOTE) The Xpert Xpress SARS-CoV-2/FLU/RSV assay is intended as an aid in  the diagnosis of influenza from Nasopharyngeal swab specimens and  should not be used as a sole basis for treatment. Nasal washings  and  aspirates are unacceptable for Xpert Xpress SARS-CoV-2/FLU/RSV  testing. Fact Sheet for Patients: https://www.moore.com/ Fact Sheet for Healthcare Providers: https://www.young.biz/ This test is not yet approved or cleared by the [place] FDA and  has been authorized for detection and/or diagnosis of SARS-CoV-2 by  FDA under an Emergency Use Authorization (EUA). This EUA will remain  in effect (meaning this test can be used) for the duration of the  Covid-19 declaration under Section 564(b)(1) of the Act, 21  U.S.C. section 360bbb-3(b)(1), unless the authorization is  terminated or revoked. Performed at [facility], [address], [place], [place] [address]    #1. DKA. Likely related to patient not taking her insulin due to poor appetite intermittent nausea and vomiting.  Started on DKA protocal.- now resolved.  Transfer to tele floor Off insulin gtt HA1C is 12.6 Still hyperglycemic today.  We will add Lantus 5 units nightly   #2. Acute kidney injury. Chart review indicates her creatinine 1 year ago was 0.70. Creatinine on admission was 1.40. Likely related to dehydration in the setting of #1.  Improved  -Hold nephrotoxins -Monitor urine output   #3. Dehydration- 2/2 decrease po intake and DKA. Improved with ivf.   #4. Generalized weakness/nausea. Patient received her second Covid vaccine shortly thereafter her intermittent nausea and decreased oral intake started. Baseline she lives at home.He did have a recent fall on the ice 2 weeks ago. -see #1.  -zofran prn -PT consult-home PT set up for her  #5. Mild Elevated troponin. HS troponin 21.  Likely mild demand ischemia due to AKI and DKA.    EKG without acute changes.  Patient also asymptomatic She does have a history of CAD/MI.  Continue BB, statin, aspirin, Imdur. -We will hold ACE inhibitor andHCTZ-will need to readdress this as  outpatient -monitor on tele   6. Hypertension. Better controlled -We will hold ACE inhibitor and hydrochlorothiazide secondary to AKI -We will continuehydralazine, imdur, BB. Increased hydralazine to 3 times daily 25 mg -monitor   #7. GERD. Stable at baseline. -Continue PPI  #10. Mild dementia. Appears stable at baseline. -Continue home meds  Time coordinating discharge: Over 30 minutes  SIGNED:   Lynn Ito, MD  [facility] 10/10/2019, 2:55 PM Pager   If 7PM-7AM, please contact night-coverage [email or web address] Password [email or web address]

## 2019-10-10 NOTE — Progress Notes (Signed)
Physical Therapy Treatment Patient Details Name: Vicki [name] MRN: [id number] DOB: 1940/08/06 Today's Date: 10/10/2019    History of Present Illness  79 y.o. female with medical history significant of hypertension, insulin-dependent diabetes, MI, stroke, GERD presents to the emergency department chief complaint generalized weakness nausea and vomiting.  Patient reports no appetite for 2 weeks.  Associated symptoms include nausea with intermittent vomiting.  She states she has not been taking her insulin since she has not been eating and has had no appetite.  Pt had fall on ice a few weeks ago, landing on R LE and has had pain since (reports she had x-rays that were negative)    PT Comments    Patient received sitting up on side of bed. Agrees to PT session. Patient reports she is feeling better, hopes to go home today. States she is used to being very independent. Ambulated 400 feet with RW and supervision. No balance deficits noted, mild fatigue at end of session. Good gait speed. Patient does not require PT follow up at this time and should be able to ambulate safely on her own.       Follow Up Recommendations  Home health PT     Equipment Recommendations       Recommendations for Other Services       Precautions / Restrictions Precautions Precaution Comments: mod fall Restrictions Weight Bearing Restrictions: No    Mobility  Bed Mobility Overal bed mobility: Independent                Transfers Overall transfer level: Independent Equipment used: Rolling walker (2 wheeled) Transfers: Sit to/from Stand Sit to Stand: Independent         General transfer comment: Pt able to rise with good confidence and safety  Ambulation/Gait Ambulation/Gait assistance: Supervision Gait Distance (Feet): 400 Feet Assistive device: Rolling walker (2 wheeled) Gait Pattern/deviations: WFL(Within Functional Limits);Step-through pattern Gait velocity: WNL   General Gait Details:  ambulates at good pace, mild fatigue after two laps around   Stairs             Wheelchair Mobility    Modified Rankin (Stroke Patients Only)       Balance Overall balance assessment: Modified Independent                                          Cognition Arousal/Alertness: Awake/alert Behavior During Therapy: WFL for tasks assessed/performed Overall Cognitive Status: Within Functional Limits for tasks assessed                                        Exercises      General Comments        Pertinent Vitals/Pain Pain Assessment: No/denies pain    Home Living                      Prior Function            PT Goals (current goals can now be found in the care plan section) Acute Rehab PT Goals Patient Stated Goal: get back to walking better and control her blood sugars PT Goal Formulation: With patient Time For Goal Achievement: 10/21/19 Potential to Achieve Goals: Good Progress towards PT goals: Goals met/education completed, patient discharged from PT  Frequency    Min 2X/week      PT Plan Current plan remains appropriate    Co-evaluation              AM-PAC PT "6 Clicks" Mobility   Outcome Measure  Help needed turning from your back to your side while in a flat bed without using bedrails?: None Help needed moving from lying on your back to sitting on the side of a flat bed without using bedrails?: None Help needed moving to and from a bed to a chair (including a wheelchair)?: None Help needed standing up from a chair using your arms (e.g., wheelchair or bedside chair)?: None Help needed to walk in hospital room?: None Help needed climbing 3-5 steps with a railing? : A Little 6 Click Score: 23    End of Session   Activity Tolerance: Patient tolerated treatment well Patient left: in bed;with call bell/phone within reach Nurse Communication: Mobility status       Time: 1420-1440 PT Time  Calculation (min) (ACUTE ONLY): 20 min  Charges:  $Gait Training: 8-22 mins                     Venia Riveron, PT, GCS 10/10/19,2:50 PM

## 2019-10-10 NOTE — Progress Notes (Signed)
Patient adequate for discharge. Understands discharge teaching and paperwork. VSS. Discharge home with sister today.
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
## 2019-10-10 NOTE — Plan of Care (Signed)
  Problem: Clinical Measurements: Goal: Diagnostic test results will improve Outcome: Progressing   Problem: Activity: Goal: Risk for activity intolerance will decrease Outcome: Progressing   

## 2019-10-14 ENCOUNTER — Inpatient Hospital Stay: Payer: Medicare Other

## 2019-10-14 ENCOUNTER — Emergency Department: Payer: Medicare Other

## 2019-10-14 ENCOUNTER — Inpatient Hospital Stay
Admission: EM | Admit: 2019-10-14 | Discharge: 2019-10-18 | DRG: 638 | Disposition: A | Discharge status: Home Health | Payer: Medicare Other | Attending: Internal Medicine | Admitting: Internal Medicine

## 2019-10-14 ENCOUNTER — Other Ambulatory Visit: Payer: Self-pay

## 2019-10-14 ENCOUNTER — Encounter: Payer: Self-pay | Admitting: Emergency Medicine

## 2019-10-14 DIAGNOSIS — K219 Gastro-esophageal reflux disease without esophagitis: Secondary | ICD-10-CM | POA: Diagnosis present

## 2019-10-14 DIAGNOSIS — E876 Hypokalemia: Secondary | ICD-10-CM | POA: Diagnosis not present

## 2019-10-14 DIAGNOSIS — F4325 Adjustment disorder with mixed disturbance of emotions and conduct: Secondary | ICD-10-CM | POA: Diagnosis present

## 2019-10-14 DIAGNOSIS — Z20822 Contact with and (suspected) exposure to covid-19: Secondary | ICD-10-CM | POA: Diagnosis present

## 2019-10-14 DIAGNOSIS — Z79899 Other long term (current) drug therapy: Secondary | ICD-10-CM

## 2019-10-14 DIAGNOSIS — D72829 Elevated white blood cell count, unspecified: Secondary | ICD-10-CM | POA: Diagnosis present

## 2019-10-14 DIAGNOSIS — I152 Hypertension secondary to endocrine disorders: Secondary | ICD-10-CM | POA: Diagnosis present

## 2019-10-14 DIAGNOSIS — E11649 Type 2 diabetes mellitus with hypoglycemia without coma: Secondary | ICD-10-CM | POA: Diagnosis not present

## 2019-10-14 DIAGNOSIS — E875 Hyperkalemia: Secondary | ICD-10-CM | POA: Diagnosis present

## 2019-10-14 DIAGNOSIS — Z90710 Acquired absence of both cervix and uterus: Secondary | ICD-10-CM | POA: Diagnosis not present

## 2019-10-14 DIAGNOSIS — Z8673 Personal history of transient ischemic attack (TIA), and cerebral infarction without residual deficits: Secondary | ICD-10-CM | POA: Diagnosis not present

## 2019-10-14 DIAGNOSIS — E1159 Type 2 diabetes mellitus with other circulatory complications: Secondary | ICD-10-CM | POA: Diagnosis present

## 2019-10-14 DIAGNOSIS — Z9114 Patient's other noncompliance with medication regimen: Secondary | ICD-10-CM | POA: Diagnosis not present

## 2019-10-14 DIAGNOSIS — Z7982 Long term (current) use of aspirin: Secondary | ICD-10-CM | POA: Diagnosis not present

## 2019-10-14 DIAGNOSIS — N179 Acute kidney failure, unspecified: Secondary | ICD-10-CM | POA: Diagnosis present

## 2019-10-14 DIAGNOSIS — E1110 Type 2 diabetes mellitus with ketoacidosis without coma: Principal | ICD-10-CM | POA: Diagnosis present

## 2019-10-14 DIAGNOSIS — R531 Weakness: Secondary | ICD-10-CM

## 2019-10-14 DIAGNOSIS — I2510 Atherosclerotic heart disease of native coronary artery without angina pectoris: Secondary | ICD-10-CM | POA: Diagnosis present

## 2019-10-14 DIAGNOSIS — I10 Essential (primary) hypertension: Secondary | ICD-10-CM | POA: Diagnosis present

## 2019-10-14 DIAGNOSIS — Z794 Long term (current) use of insulin: Secondary | ICD-10-CM | POA: Diagnosis not present

## 2019-10-14 DIAGNOSIS — Z833 Family history of diabetes mellitus: Secondary | ICD-10-CM | POA: Diagnosis not present

## 2019-10-14 DIAGNOSIS — R1084 Generalized abdominal pain: Secondary | ICD-10-CM | POA: Diagnosis present

## 2019-10-14 DIAGNOSIS — Z8249 Family history of ischemic heart disease and other diseases of the circulatory system: Secondary | ICD-10-CM | POA: Diagnosis not present

## 2019-10-14 DIAGNOSIS — R112 Nausea with vomiting, unspecified: Secondary | ICD-10-CM | POA: Diagnosis present

## 2019-10-14 DIAGNOSIS — E785 Hyperlipidemia, unspecified: Secondary | ICD-10-CM | POA: Diagnosis present

## 2019-10-14 DIAGNOSIS — F03A0 Unspecified dementia, mild, without behavioral disturbance, psychotic disturbance, mood disturbance, and anxiety: Secondary | ICD-10-CM | POA: Diagnosis present

## 2019-10-14 DIAGNOSIS — I252 Old myocardial infarction: Secondary | ICD-10-CM | POA: Diagnosis not present

## 2019-10-14 DIAGNOSIS — F0390 Unspecified dementia without behavioral disturbance: Secondary | ICD-10-CM | POA: Diagnosis present

## 2019-10-14 LAB — CBC
HCT: 39.3 % (ref 36.0–46.0)
Hemoglobin: 12.3 g/dL (ref 12.0–15.0)
MCH: 30.7 pg (ref 26.0–34.0)
MCHC: 31.3 g/dL (ref 30.0–36.0)
MCV: 98 fL (ref 80.0–100.0)
Platelets: 288 10*3/uL (ref 150–400)
RBC: 4.01 MIL/uL (ref 3.87–5.11)
RDW: 12.5 % (ref 11.5–15.5)
WBC: 21 10*3/uL — ABNORMAL HIGH (ref 4.0–10.5)
nRBC: 0 % (ref 0.0–0.2)

## 2019-10-14 LAB — OSMOLALITY, URINE: Osmolality, Ur: 513 mOsm/kg (ref 300–900)

## 2019-10-14 LAB — BASIC METABOLIC PANEL
Anion gap: 17 — ABNORMAL HIGH (ref 5–15)
Anion gap: 12 (ref 5–15)
BUN: 42 mg/dL — ABNORMAL HIGH (ref 8–23)
BUN: 38 mg/dL — ABNORMAL HIGH (ref 8–23)
BUN: 37 mg/dL — ABNORMAL HIGH (ref 8–23)
CO2: <7 mmol/L — ABNORMAL LOW (ref 22–32)
CO2: 11 mmol/L — ABNORMAL LOW (ref 22–32)
CO2: 17 mmol/L — ABNORMAL LOW (ref 22–32)
Calcium: 10.2 mg/dL (ref 8.9–10.3)
Calcium: 9 mg/dL (ref 8.9–10.3)
Calcium: 9.3 mg/dL (ref 8.9–10.3)
Chloride: 104 mmol/L (ref 98–111)
Chloride: 114 mmol/L — ABNORMAL HIGH (ref 98–111)
Chloride: 113 mmol/L — ABNORMAL HIGH (ref 98–111)
Creatinine, Ser: 1.68 mg/dL — ABNORMAL HIGH (ref 0.44–1.00)
Creatinine, Ser: 1.5 mg/dL — ABNORMAL HIGH (ref 0.44–1.00)
Creatinine, Ser: 1.37 mg/dL — ABNORMAL HIGH (ref 0.44–1.00)
GFR calc Af Amer: 33 mL/min — ABNORMAL LOW (ref >60)
GFR calc Af Amer: 38 mL/min — ABNORMAL LOW (ref >60)
GFR calc Af Amer: 42 mL/min — ABNORMAL LOW (ref >60)
GFR calc non Af Amer: 28 mL/min — ABNORMAL LOW (ref >60)
GFR calc non Af Amer: 33 mL/min — ABNORMAL LOW (ref >60)
GFR calc non Af Amer: 36 mL/min — ABNORMAL LOW (ref >60)
Glucose, Bld: 578 mg/dL (ref 70–99)
Glucose, Bld: 333 mg/dL — ABNORMAL HIGH (ref 70–99)
Glucose, Bld: 283 mg/dL — ABNORMAL HIGH (ref 70–99)
Potassium: 4.9 mmol/L (ref 3.5–5.1)
Potassium: 3.8 mmol/L (ref 3.5–5.1)
Potassium: 3.6 mmol/L (ref 3.5–5.1)
Sodium: 138 mmol/L (ref 135–145)
Sodium: 142 mmol/L (ref 135–145)
Sodium: 142 mmol/L (ref 135–145)

## 2019-10-14 LAB — GLUCOSE, CAPILLARY
Glucose-Capillary: >600 mg/dL (ref 70–99)
Glucose-Capillary: 484 mg/dL — ABNORMAL HIGH (ref 70–99)
Glucose-Capillary: 529 mg/dL (ref 70–99)
Glucose-Capillary: 437 mg/dL — ABNORMAL HIGH (ref 70–99)
Glucose-Capillary: 384 mg/dL — ABNORMAL HIGH (ref 70–99)
Glucose-Capillary: 301 mg/dL — ABNORMAL HIGH (ref 70–99)
Glucose-Capillary: 357 mg/dL — ABNORMAL HIGH (ref 70–99)
Glucose-Capillary: 292 mg/dL — ABNORMAL HIGH (ref 70–99)
Glucose-Capillary: 239 mg/dL — ABNORMAL HIGH (ref 70–99)
Glucose-Capillary: 243 mg/dL — ABNORMAL HIGH (ref 70–99)

## 2019-10-14 LAB — COMPREHENSIVE METABOLIC PANEL
ALT: 28 U/L (ref 0–44)
AST: 30 U/L (ref 15–41)
Albumin: 4.1 g/dL (ref 3.5–5.0)
Alkaline Phosphatase: 120 U/L (ref 38–126)
BUN: 42 mg/dL — ABNORMAL HIGH (ref 8–23)
CO2: <7 mmol/L — ABNORMAL LOW (ref 22–32)
Calcium: 10.1 mg/dL (ref 8.9–10.3)
Chloride: 98 mmol/L (ref 98–111)
Creatinine, Ser: 1.7 mg/dL — ABNORMAL HIGH (ref 0.44–1.00)
GFR calc Af Amer: 32 mL/min — ABNORMAL LOW (ref >60)
GFR calc non Af Amer: 28 mL/min — ABNORMAL LOW (ref >60)
Glucose, Bld: 748 mg/dL (ref 70–99)
Potassium: 6.2 mmol/L — ABNORMAL HIGH (ref 3.5–5.1)
Sodium: 132 mmol/L — ABNORMAL LOW (ref 135–145)
Total Bilirubin: 2 mg/dL — ABNORMAL HIGH (ref 0.3–1.2)
Total Protein: 7.9 g/dL (ref 6.5–8.1)

## 2019-10-14 LAB — BLOOD GAS, VENOUS
Acid-base deficit: 20.3 mmol/L — ABNORMAL HIGH (ref 0.0–2.0)
Bicarbonate: 7 mmol/L — ABNORMAL LOW (ref 20.0–28.0)
O2 Saturation: 67.7 %
Patient temperature: 37
pCO2, Ven: 21 mmHg — ABNORMAL LOW (ref 44.0–60.0)
pH, Ven: 7.13 — CL (ref 7.250–7.430)
pO2, Ven: 48 mmHg — ABNORMAL HIGH (ref 32.0–45.0)

## 2019-10-14 LAB — CREATININE, URINE, RANDOM: Creatinine, Urine: 21 mg/dL

## 2019-10-14 LAB — LACTIC ACID, PLASMA
Lactic Acid, Venous: 3.6 mmol/L (ref 0.5–1.9)
Lactic Acid, Venous: 4.6 mmol/L (ref 0.5–1.9)

## 2019-10-14 LAB — NA AND K (SODIUM & POTASSIUM), RAND UR
Potassium Urine: 23 mmol/L
Sodium, Ur: 77 mmol/L

## 2019-10-14 LAB — RESPIRATORY PANEL BY RT PCR (FLU A&B, COVID)
Influenza A by PCR: NEGATIVE
Influenza B by PCR: NEGATIVE
SARS Coronavirus 2 by RT PCR: NEGATIVE

## 2019-10-14 LAB — BETA-HYDROXYBUTYRIC ACID
Beta-Hydroxybutyric Acid: >8 mmol/L — ABNORMAL HIGH (ref 0.05–0.27)
Beta-Hydroxybutyric Acid: >8 mmol/L — ABNORMAL HIGH (ref 0.05–0.27)

## 2019-10-14 LAB — URINALYSIS, COMPLETE (UACMP) WITH MICROSCOPIC
Bacteria, UA: NONE SEEN
Bilirubin Urine: NEGATIVE
Glucose, UA: >=500 mg/dL — AB
Ketones, ur: 80 mg/dL — AB
Leukocytes,Ua: NEGATIVE
Nitrite: NEGATIVE
Protein, ur: NEGATIVE mg/dL
Specific Gravity, Urine: 1.016 (ref 1.005–1.030)
Squamous Epithelial / HPF: NONE SEEN (ref 0–5)
pH: 5 (ref 5.0–8.0)

## 2019-10-14 LAB — MRSA PCR SCREENING: MRSA by PCR: POSITIVE — AB

## 2019-10-14 LAB — MAGNESIUM: Magnesium: 2.5 mg/dL — ABNORMAL HIGH (ref 1.7–2.4)

## 2019-10-14 MED ORDER — LACTATED RINGERS IV BOLUS: 1000.0000 mL | Freq: Once | INTRAVENOUS | Status: DC

## 2019-10-14 MED ORDER — DEXTROSE 50 % IV SOLN
0.0000 mL | INTRAVENOUS | Status: DC | PRN
  Filled 2019-10-14: qty 50

## 2019-10-14 MED ORDER — LACTATED RINGERS IV BOLUS
1000.0000 mL | Freq: Once | INTRAVENOUS | Status: AC
  Administered 2019-10-14: 1000 mL via INTRAVENOUS

## 2019-10-14 MED ORDER — CALCIUM GLUCONATE 10 % IV SOLN
1.0000 g | Freq: Once | INTRAVENOUS | Status: AC
  Administered 2019-10-14: 1 g via INTRAVENOUS
  Filled 2019-10-14: qty 10

## 2019-10-14 MED ORDER — HEPARIN SODIUM (PORCINE) 5000 UNIT/ML IJ SOLN
5000.0000 [IU] | Freq: Three times a day (TID) | INTRAMUSCULAR | Status: DC
  Administered 2019-10-14 – 2019-10-16 (×5): 5000 [IU] via SUBCUTANEOUS
  Filled 2019-10-14 (×5): qty 1

## 2019-10-14 MED ORDER — ASPIRIN EC 81 MG PO TBEC
81.0000 mg | DELAYED_RELEASE_TABLET | Freq: Every day | ORAL | Status: DC
  Administered 2019-10-15 – 2019-10-18 (×4): 81 mg via ORAL
  Filled 2019-10-14 (×4): qty 1

## 2019-10-14 MED ORDER — SODIUM BICARBONATE 8.4 % IV SOLN
50.0000 meq | Freq: Once | INTRAVENOUS | Status: AC
  Administered 2019-10-14: 50 meq via INTRAVENOUS
  Filled 2019-10-14: qty 50

## 2019-10-14 MED ORDER — INSULIN REGULAR(HUMAN) IN NACL 100-0.9 UT/100ML-% IV SOLN
INTRAVENOUS | Status: DC
  Administered 2019-10-14: 5.5 [IU]/h via INTRAVENOUS
  Filled 2019-10-14: qty 100

## 2019-10-14 MED ORDER — SODIUM CHLORIDE 0.9 % IV SOLN: INTRAVENOUS | Status: DC

## 2019-10-14 MED ORDER — IOHEXOL 9 MG/ML PO SOLN
500.0000 mL | ORAL | Status: AC
  Administered 2019-10-14 (×2): 500 mL via ORAL

## 2019-10-14 MED ORDER — DEXTROSE 50 % IV SOLN: 0.0000 mL | INTRAVENOUS | Status: DC | PRN

## 2019-10-14 MED ORDER — DEXTROSE-NACL 5-0.45 % IV SOLN: INTRAVENOUS | Status: DC

## 2019-10-14 MED ORDER — SODIUM CHLORIDE 0.9 % IV BOLUS
1000.0000 mL | INTRAVENOUS | Status: AC
  Administered 2019-10-14 (×2): 1000 mL via INTRAVENOUS

## 2019-10-14 MED ORDER — ONDANSETRON HCL 4 MG/2ML IJ SOLN
4.0000 mg | Freq: Once | INTRAMUSCULAR | Status: AC
  Administered 2019-10-14: 4 mg via INTRAVENOUS
  Filled 2019-10-14: qty 2

## 2019-10-14 MED ORDER — INSULIN REGULAR(HUMAN) IN NACL 100-0.9 UT/100ML-% IV SOLN
INTRAVENOUS | Status: DC
  Administered 2019-10-14: 5.5 [IU]/h via INTRAVENOUS

## 2019-10-14 MED ORDER — SODIUM BICARBONATE 8.4 % IV SOLN
Freq: Once | INTRAVENOUS | Status: AC
  Filled 2019-10-14 (×2): qty 100

## 2019-10-14 NOTE — ED Triage Notes (Signed)
Pt presents to ED via [facility] with c/o hyperglycemia. Per EMS pt had near syncopal episode while in their care, reports CBG reading HI. 18g to L AC initiated by EMS PTA   Upon arrival to ED pt's VSS, CBG reading in ED HI. Pt reports increasing weakness since arriving in ED.

## 2019-10-14 NOTE — ED Provider Notes (Signed)
[facility] Emergency Department Provider Note   ____________________________________________   First MD Initiated Contact with Patient 10/14/19 1341     (approximate)  I have reviewed the triage vital signs and the nursing notes.   HISTORY  Chief Complaint Weakness and Hyperglycemia    HPI Vicki [name] is a 79 y.o. female with past medical history of hypertension, diabetes, CAD, and stroke presents to the ED complaining of generalized weakness.  Patient reports that she has continued to feel very weak ever since leaving the hospital 4 days ago.  She continues to feel nauseous with multiple episodes of vomiting and difficulty eating.  She denies any associated abdominal pain and does state that she has been having diarrhea.  She is not aware of any fevers but has not taken her temperature at home.  She states she has continued to take her insulin despite having a hard time keeping down any food.  She denies any cough, chest pain, or shortness of breath.        Past Medical History:  Diagnosis Date  . Diabetes mellitus without complication (HCC)   . GERD (gastroesophageal reflux disease)   . Hypertension   . Lesion of sciatic nerve, right side   . MI (myocardial infarction) (HCC)   . Stroke [facility])     Patient Active Problem List   Diagnosis Date Noted  . Diabetic ketoacidosis without coma associated with diabetes mellitus due to underlying condition (HCC)   . Dehydration 10/06/2019  . Generalized weakness 10/06/2019  . Acute kidney injury (HCC) 10/06/2019  . Hypertension   . Hypoglycemia 12/10/2017  . 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 NSTEMI (non-ST elevated myocardial infarction) (HCC) 10/01/2016  . 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 Diabetes (HCC) 09/29/2016  . GERD (gastroesophageal reflux disease) 09/29/2016  . Nausea and vomiting 03/24/2016  .  Diffuse abdominal pain 03/24/2016  . Hypokalemia 03/24/2016  . Chest pain 03/24/2016  . Elevated troponin I level 09/26/2015  . 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HCC) 09/25/2015    Past Surgical History:  Procedure Laterality Date  . ABDOMINAL HYSTERECTOMY    . BREAST BIOPSY Right    neg  . BREAST SURGERY    . LEFT HEART CATH AND CORONARY ANGIOGRAPHY N/A 10/01/2016   Procedure: Left Heart Cath and Coronary Angiography and possible PCI;  Surgeon: Alwyn Pea, MD;  Location: [facility] INVASIVE CV LAB;  Service: Cardiovascular;  Laterality: N/A;    Prior to Admission medications   Medication Sig Start Date End Date Taking? Authorizing Provider  acetaminophen (TYLENOL) 500 MG tablet Take 1,000-1,500 mg by mouth every 6 (six) hours as needed for mild pain or headache.    [provider]  aspirin EC 81 MG EC tablet Take 1 tablet (81 mg total) by mouth daily. 03/26/16   Katharina Caper, MD  brimonidine-timolol (COMBIGAN) 0.2-0.5 % ophthalmic solution Place 1 drop into both eyes daily.    [provider]  citalopram (CELEXA) 20 MG tablet Take 20 mg by mouth daily.    [provider]  docusate sodium (COLACE) 100 MG capsule Take 1 capsule (100 mg total) by mouth 2 (two) times daily. Patient taking differently: Take 100 mg by mouth daily.  10/04/16   Auburn Bilberry, MD  donepezil (ARICEPT) 5 MG tablet Take 5 mg by mouth at bedtime.    [provider]  fluticasone (FLONASE) 50 MCG/ACT nasal spray Place 2 sprays  into both nostrils daily.    [provider]  hydrALAZINE (APRESOLINE) 50 MG tablet Take 1 tablet (50 mg total) by mouth every 8 (eight) hours. Patient taking differently: Take 25 mg by mouth daily.  03/12/18   [name], MD  Insulin Glargine (TOUJEO SOLOSTAR) 300 UNIT/ML SOPN Inject 30 Units into the skin at bedtime. 12/11/17   Houston Siren, MD  insulin lispro (HUMALOG) 100 UNIT/ML injection Inject 0.04 mLs (4 Units total) into the skin 3 (three) times daily  before meals. Pt uses per sliding scale. Patient taking differently: Inject into the skin 3 (three) times daily before meals. Pt uses per sliding scale. 12/11/17   Houston Siren, MD  isosorbide mononitrate (IMDUR) 30 MG 24 hr tablet Take 1 tablet (30 mg total) by mouth daily. 03/26/16   Katharina Caper, MD  labetalol (NORMODYNE) 100 MG tablet Take 100 mg by mouth 2 (two) times daily.    [provider]  loratadine (CLARITIN) 10 MG tablet Take 10 mg by mouth daily.    [provider]  lovastatin (MEVACOR) 40 MG tablet Take 40 mg by mouth at bedtime.     [provider]  magnesium oxide (MAG-OX) 400 MG tablet Take 400 mg by mouth daily.    [provider]  memantine (NAMENDA) 5 MG tablet Take 5 mg by mouth every evening.    [provider]  Multiple Vitamins-Minerals (CENTRUM ADULTS) TABS Take 1 tablet by mouth every morning.     [provider]  omeprazole (PRILOSEC) 20 MG capsule Take 1 capsule (20 mg total) by mouth daily. 09/28/15   Katha Hamming, MD  polyethylene glycol (MIRALAX / GLYCOLAX) packet Take 17 g by mouth daily. 05/03/15   [name], MD    Allergies Patient has no known allergies.  Family History  Problem Relation Age of Onset  . Heart disease Mother   . Diabetes Mellitus II Father   . Hypertension Sister   . Diabetes Mellitus II Sister   . Breast cancer Sister 76  . Hypertension Brother   . Diabetes Mellitus II Brother     Social History Social History   Tobacco Use  . Smoking status: Never Smoker  . Smokeless tobacco: Never Used  Substance Use Topics  . Alcohol use: No  . Drug use: No    Review of Systems  Constitutional: No fever/chills.  Positive for generalized weakness. Eyes: No visual changes. ENT: No sore throat. Cardiovascular: Denies chest pain. Respiratory: Denies shortness of breath. Gastrointestinal: No abdominal pain.  Positive for nausea and vomiting.  No diarrhea.  No  constipation. Genitourinary: Negative for dysuria. Musculoskeletal: Negative for back pain. Skin: Negative for rash. Neurological: Negative for headaches, focal weakness or numbness.  ____________________________________________   PHYSICAL EXAM:  VITAL SIGNS: ED Triage Vitals  Enc Vitals Group     BP 10/14/19 1301 (!) 149/50     Pulse Rate 10/14/19 1301 84     Resp 10/14/19 1301 (!) 22     Temp 10/14/19 1301 98.7 F (37.1 C)     Temp Source 10/14/19 1301 Oral     SpO2 10/14/19 1301 100 %     Weight 10/14/19 1312 141 lb 14.2 oz (64.4 kg)     Height 10/14/19 1312 5\' 4"  (1.626 m)     Head Circumference --      Peak Flow --      Pain Score 10/14/19 1312 0     Pain Loc --  Pain Edu? --      Excl. in GC? --     Constitutional: Alert and oriented. Eyes: Conjunctivae are normal. Head: Atraumatic. Nose: No congestion/rhinnorhea. Mouth/Throat: Mucous membranes are moist. Neck: Normal ROM Cardiovascular: Normal rate, regular rhythm. Grossly normal heart sounds. Respiratory: Tachypneic with normal respiratory effort.  No retractions. Lungs CTAB. Gastrointestinal: Soft and nontender. No distention. Genitourinary: deferred Musculoskeletal: No lower extremity tenderness nor edema. Neurologic:  Normal speech and language. No gross focal neurologic deficits are appreciated. Skin:  Skin is warm, dry and intact. No rash noted. Psychiatric: Mood and affect are normal. Speech and behavior are normal.  ____________________________________________   LABS (all labs ordered are listed, but only abnormal results are displayed)  Labs Reviewed  GLUCOSE, CAPILLARY - Abnormal; Notable for the following components:      Result Value   Glucose-Capillary >600 (*)    All other components within normal limits  CBC - Abnormal; Notable for the following components:   WBC 21.0 (*)    All other components within normal limits  BLOOD GAS, VENOUS - Abnormal; Notable for the following  components:   pH, Ven 7.13 (*)    pCO2, Ven 21 (*)    pO2, Ven 48.0 (*)    Bicarbonate 7.0 (*)    Acid-base deficit 20.3 (*)    All other components within normal limits  COMPREHENSIVE METABOLIC PANEL - Abnormal; Notable for the following components:   Sodium 132 (*)    Potassium 6.2 (*)    CO2 <7 (*)    Glucose, Bld 748 (*)    BUN 42 (*)    Creatinine, Ser 1.70 (*)    Total Bilirubin 2.0 (*)    GFR calc non Af Amer 28 (*)    GFR calc Af Amer 32 (*)    All other components within normal limits  MAGNESIUM - Abnormal; Notable for the following components:   Magnesium 2.5 (*)    All other components within normal limits  LACTIC ACID, PLASMA - Abnormal; Notable for the following components:   Lactic Acid, Venous 3.6 (*)    All other components within normal limits  CULTURE, BLOOD (ROUTINE X 2)  CULTURE, BLOOD (ROUTINE X 2)  RESPIRATORY PANEL BY RT PCR (FLU A&B, COVID)  URINALYSIS, COMPLETE (UACMP) WITH MICROSCOPIC  BETA-HYDROXYBUTYRIC ACID  LACTIC ACID, PLASMA  CBG MONITORING, ED  CBG MONITORING, ED  CBG MONITORING, ED   ____________________________________________  EKG  ED ECG REPORT I, Chesley Noon, the attending physician, personally viewed and interpreted this ECG.   Date: 10/14/2019  EKG Time: 13:01  Rate: 84  Rhythm: normal sinus rhythm  Axis: LAD  Intervals:left anterior fascicular block  ST&T Change: Peaked T waves, inferior T wave inversions   PROCEDURES  Procedure(s) performed (including Critical Care):  .Critical Care Performed by: Chesley Noon, MD Authorized by: Chesley Noon, MD   Critical care provider statement:    Critical care time (minutes):  45   Critical care time was exclusive of:  Separately billable procedures and treating other patients and teaching time   Critical care was necessary to treat or prevent imminent or life-threatening deterioration of the following conditions:  Metabolic crisis   Critical care was time spent  personally by me on the following activities:  Discussions with consultants, evaluation of patient's response to treatment, examination of patient, ordering and performing treatments and interventions, ordering and review of laboratory studies, ordering and review of radiographic studies, pulse oximetry, re-evaluation of patient's condition, obtaining history from  patient or surrogate and review of old charts     ____________________________________________   INITIAL IMPRESSION / ASSESSMENT AND PLAN / ED COURSE       79 year old female with history of diabetes presents to the ED with ongoing generalized weakness as well as nausea and vomiting following recent admission for DKA.  I would be concerned that she is in DKA again today given her hyperglycemia and acidosis noted on VBG.  We will initiate aggressive IV fluid hydration, also screen for infectious process contributing to her DKA as she states she has been compliant with insulin.  She denies any specific infectious symptoms and has a reassuring abdominal exam.  No obvious infectious process at this time, chest x-ray is clear by my read and patient has no abdominal tenderness whatsoever.  UA is pending, however patient denies any urinary symptoms.  Blood cultures and lactic acid ordered, however will hold off on antibiotics at this time given no apparent source.  BMP is consistent with DKA along with hyperkalemia.  Given peaked T waves on EKG, we will give a dose of calcium and start patient on an insulin drip.  Case discussed with hospitalist, who accepts patient for admission.      ____________________________________________   FINAL CLINICAL IMPRESSION(S) / ED DIAGNOSES  Final diagnoses:  Diabetic ketoacidosis without coma associated with type 2 diabetes mellitus (HCC)  Non-intractable vomiting with nausea, unspecified vomiting type  Generalized weakness     ED Discharge Orders    None       Note:  This document was  prepared using Dragon voice recognition software and may include unintentional dictation errors.   Blake Divine, MD 10/14/19 1459

## 2019-10-14 NOTE — Progress Notes (Signed)
Pt arrived on unit at this time and transferred to ICU bed. Pt is drowsy but can answer some questions appropriately. Breathing is normal on room air. All other VSS. Pt on insulin gtt.

## 2019-10-14 NOTE — H&P (Addendum)
[facility] History and Physical   Patient: Vicki [name] TGG:[id number]   PCP: Lynnea Ferrier, MD DOB: 12-06-1940   DOA: 10/14/2019   DOS: 10/14/2019   DOS: the patient was seen and examined on 10/14/2019  Patient coming from: The patient is coming from Home  Chief Complaint: Abdominal pain, nausea, vomiting, diarrhea  HPI: Vicki [name] is a 79 y.o. female with Past medical history of uncontrolled diabetes with recurrent DKA, GERD, HTN, mild dementia. Patient presented with complaints of poor p.o. intake as well as abdominal pain ongoing since her recent discharge.  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  Discharge home.  After reaching home patient continues to feel fatigue and tired.  Since Monday started having reports of nausea and vomiting.  She mentions that she is compliant with all her medication but does not know name of her medication.  She tells me that she is fairly independent.  Her family does not help her with medications.  She denies any fever or chills denies any chest pain or chest tightness.  ED Course: On presentation undetectable bicarb, severe hyperglycemia with blood sugar screen 700 as well as acute kidney injury.  Patient was started on IV insulin.  Patient was also given IV calcium gluconate for hyperkalemia.  Patient was referred for admission.  At her baseline ambulates with assistance independent for most of her ADL;  manages her medication on her own.  Review of Systems: as mentioned in the history of present illness.  All other systems reviewed and are negative.  Past Medical History:  Diagnosis Date  . Diabetes mellitus without complication (HCC)   . GERD (gastroesophageal reflux disease)   . Hypertension   . Lesion of sciatic nerve, right side   . MI (myocardial infarction) (HCC)   . Stroke [facility])    Past Surgical History:  Procedure Laterality Date  . ABDOMINAL HYSTERECTOMY    . BREAST BIOPSY Right    neg  .  BREAST SURGERY    . LEFT HEART CATH AND CORONARY ANGIOGRAPHY N/A 10/01/2016   Procedure: Left Heart Cath and Coronary Angiography and possible PCI;  Surgeon: Alwyn Pea, MD;  Location: [facility] INVASIVE CV LAB;  Service: Cardiovascular;  Laterality: N/A;   Social History:  reports that she has never smoked. She has never used smokeless tobacco. She reports that she does not drink alcohol or use drugs.  No Known Allergies  Family history reviewed and not pertinent Family History  Problem Relation Age of Onset  . Heart disease Mother   . Diabetes Mellitus II Father   . Hypertension Sister   . Diabetes Mellitus II Sister   . Breast cancer Sister 21  . Hypertension Brother   . Diabetes Mellitus II Brother      Prior to Admission medications   Medication Sig Start Date End Date Taking? Authorizing Provider  aspirin EC 81 MG EC tablet Take 1 tablet (81 mg total) by mouth daily. 03/26/16  Yes Katharina Caper, MD  brimonidine-timolol (COMBIGAN) 0.2-0.5 % ophthalmic solution Place 1 drop into both eyes daily.   Yes [provider]  citalopram (CELEXA) 20 MG tablet Take 20 mg by mouth daily.   Yes [provider]  docusate sodium (COLACE) 100 MG capsule Take 1 capsule (100 mg total) by mouth 2 (two) times daily. Patient taking differently: Take 100 mg by mouth daily.  10/04/16  Yes Auburn Bilberry, MD  donepezil (ARICEPT) 5 MG tablet Take 5  mg by mouth at bedtime.   Yes [provider]  Insulin Glargine (TOUJEO SOLOSTAR) 300 UNIT/ML SOPN Inject 30 Units into the skin at bedtime. Patient taking differently: Inject 32 Units into the skin at bedtime.  12/11/17  Yes Sainani, Rolly Pancake, MD  insulin lispro (HUMALOG) 100 UNIT/ML injection Inject 0.04 mLs (4 Units total) into the skin 3 (three) times daily before meals. Pt uses per sliding scale. 12/11/17  Yes Houston Siren, MD  isosorbide mononitrate (IMDUR) 30 MG 24 hr tablet Take 1 tablet (30 mg total) by mouth daily. 03/26/16   Yes Katharina Caper, MD  labetalol (NORMODYNE) 100 MG tablet Take 100 mg by mouth 2 (two) times daily.   Yes [provider]  loratadine (CLARITIN) 10 MG tablet Take 10 mg by mouth daily.   Yes [provider]  lovastatin (MEVACOR) 40 MG tablet Take 40 mg by mouth at bedtime.    Yes [provider]  Magnesium Oxide 420 MG TABS Take 420 mg by mouth daily.    Yes [provider]  memantine (NAMENDA) 5 MG tablet Take 5 mg by mouth every evening.   Yes [provider]  Multiple Vitamins-Minerals (CENTRUM ADULTS) TABS Take 1 tablet by mouth every morning.    Yes [provider]  omeprazole (PRILOSEC) 20 MG capsule Take 1 capsule (20 mg total) by mouth daily. 09/28/15  Yes Katha Hamming, MD  polyethylene glycol (MIRALAX / GLYCOLAX) packet Take 17 g by mouth daily. 05/03/15  Yes [name], MD  acetaminophen (TYLENOL) 500 MG tablet Take 1,000-1,500 mg by mouth every 6 (six) hours as needed for mild pain or headache.    [provider]  fluticasone (FLONASE) 50 MCG/ACT nasal spray Place 2 sprays into both nostrils daily.    [provider]  hydrALAZINE (APRESOLINE) 50 MG tablet Take 1 tablet (50 mg total) by mouth every 8 (eight) hours. Patient taking differently: Take 25 mg by mouth 3 (three) times daily.  03/12/18   [name], MD    Physical Exam: Vitals:   10/14/19 1301 10/14/19 1312 10/14/19 1700 10/14/19 1800  BP: (!) 149/50   (!) 144/77  Pulse: 84  (!) 104 (!) 109  Resp: (!) 22  17 (!) 22  Temp: 98.7 F (37.1 C)  98.3 F (36.8 C)   TempSrc: Oral  Oral   SpO2: 100%  97% 100%  Weight:  64.4 kg 63.8 kg   Height:  5\' 4"  (1.626 m) 5\' 4"  (1.626 m)     General: alert and oriented to place and person. Appear in severe distress, affect flat in affect Eyes: PERRL, Conjunctiva normal ENT: Oral Mucosa Clear, dry  Neck: no JVD, no Abnormal Mass Or lumps Cardiovascular: S1 and S2 Present, no Murmur,  peripheral pulses symmetrical Respiratory: increased respiratory effort, Bilateral Air entry equal and Decreased, no signs of accessory muscle use, Clear to Auscultation, no Crackles, no wheezes Abdomen: Bowel Sound present, Soft and left lower quadrant tenderness, no hernia Skin: no rashes  Extremities: no Pedal edema, no calf tenderness Neurologic: without any new focal findings Gait not checked due to patient safety concerns  Data Reviewed: I have personally reviewed and interpreted labs, imaging as discussed below.  CBC: Recent Labs  Lab 10/14/19 1313  WBC 21.0*  HGB 12.3  HCT 39.3  MCV 98.0  PLT 288   Basic Metabolic Panel: Recent Labs  Lab 10/08/19 0755 10/09/19 0538 10/10/19 0500 10/14/19 1313 10/14/19 1720  NA 134*  136 137 132* 138  K 5.0 4.6 4.4 6.2* 4.9  CL 98 99 103 98 104  CO2 23 25 27  <7* <7*  GLUCOSE 526* 320* 213* 748* 578*  BUN 24* 24* 21 42* 42*  CREATININE 0.95 0.89 0.80 1.70* 1.68*  CALCIUM 9.1 9.1 9.0 10.1 10.2  MG  --   --   --  2.5*  --    GFR: Estimated Creatinine Clearance: 23.1 mL/min (A) (by C-G formula based on SCr of 1.68 mg/dL (H)). Liver Function Tests: Recent Labs  Lab 10/14/19 1313  AST 30  ALT 28  ALKPHOS 120  BILITOT 2.0*  PROT 7.9  ALBUMIN 4.1   No results for input(s): LIPASE, AMYLASE in the last 168 hours. No results for input(s): AMMONIA in the last 168 hours. Coagulation Profile: No results for input(s): INR, PROTIME in the last 168 hours. Cardiac Enzymes: No results for input(s): CKTOTAL, CKMB, CKMBINDEX, TROPONINI in the last 168 hours. BNP (last 3 results) No results for input(s): PROBNP in the last 8760 hours. HbA1C: No results for input(s): HGBA1C in the last 72 hours. CBG: Recent Labs  Lab 10/10/19 1218 10/14/19 1310 10/14/19 1632 10/14/19 1702 10/14/19 1801  GLUCAP 187* >600* 529* 484* 437*   Lipid Profile: No results for input(s): CHOL, HDL, LDLCALC, TRIG, CHOLHDL, LDLDIRECT in the last 72  hours. Thyroid Function Tests: No results for input(s): TSH, T4TOTAL, FREET4, T3FREE, THYROIDAB in the last 72 hours. Anemia Panel: No results for input(s): VITAMINB12, FOLATE, FERRITIN, TIBC, IRON, RETICCTPCT in the last 72 hours. Urine analysis:    Component Value Date/Time   COLORURINE STRAW (A) 10/14/2019 1502   APPEARANCEUR CLEAR (A) 10/14/2019 1502   APPEARANCEUR Hazy 04/04/2012 2050   LABSPEC 1.016 10/14/2019 1502   LABSPEC 1.017 04/04/2012 2050   PHURINE 5.0 10/14/2019 1502   GLUCOSEU >=500 (A) 10/14/2019 1502   GLUCOSEU >=500 04/04/2012 2050   HGBUR SMALL (A) 10/14/2019 1502   BILIRUBINUR NEGATIVE 10/14/2019 1502   BILIRUBINUR Negative 04/04/2012 2050   KETONESUR 80 (A) 10/14/2019 1502   PROTEINUR NEGATIVE 10/14/2019 1502   NITRITE NEGATIVE 10/14/2019 1502   LEUKOCYTESUR NEGATIVE 10/14/2019 1502   LEUKOCYTESUR Negative 04/04/2012 2050    Radiological Exams on Admission: CT ABDOMEN PELVIS WO CONTRAST  Result Date: 10/14/2019 CLINICAL DATA:  Generalized weakness, nausea. Diverticulitis suspected. EXAM: CT ABDOMEN AND PELVIS WITHOUT CONTRAST TECHNIQUE: Multidetector CT imaging of the abdomen and pelvis was performed following the standard protocol without IV contrast. COMPARISON:  CT abdomen dated 02/06/2017. FINDINGS: Lower chest: No acute abnormality. Small hiatal hernia. Hepatobiliary: No focal liver abnormality is seen. Status post cholecystectomy. No biliary dilatation. Pancreas: Unremarkable. No pancreatic ductal dilatation or surrounding inflammatory changes. Spleen: Normal in size without focal abnormality. Adrenals/Urinary Tract: Adrenal glands appear normal. Kidneys are unremarkable without mass, stone or hydronephrosis. No ureteral or bladder calculi are identified. Bladder is unremarkable. Stomach/Bowel: No dilated large or small bowel loops. No evidence of bowel wall inflammation. Vascular/Lymphatic: Aortic atherosclerosis. No enlarged lymph nodes seen. Reproductive:  Presumed hysterectomy. No adnexal mass or free fluid. Other: No free fluid or abscess collection. No free intraperitoneal air. Musculoskeletal: No acute or suspicious osseous finding. Degenerative spondylosis of the lumbar spine head lower thoracic spine, mild to moderate in degree. IMPRESSION: 1. No acute findings within the abdomen or pelvis. No bowel obstruction or evidence of bowel wall inflammation. No evidence of acute solid organ abnormality. No renal or ureteral calculi. 2. Small hiatal hernia. Aortic Atherosclerosis (ICD10-I70.0). Electronically Signed  By: Bary Richard M.D.   On: 10/14/2019 17:12   DG Chest Portable 1 View  Result Date: 10/14/2019 CLINICAL DATA:  Cough EXAM: PORTABLE CHEST 1 VIEW COMPARISON:  10/06/2019 FINDINGS: The heart size and mediastinal contours are within normal limits. Both lungs are clear. No pleural effusion or pneumothorax. The visualized skeletal structures are unremarkable. IMPRESSION: No acute process in the chest. Electronically Signed   By: Guadlupe Spanish M.D.   On: 10/14/2019 14:58   EKG: Independently reviewed. normal sinus rhythm, nonspecific ST and T waves changes, sinus tachycardia, tall T waves. Echocardiogram: EF 55 to 65% April 2019  I reviewed all nursing notes, pharmacy notes, vitals, pertinent old records.  Assessment/Plan 1.  Severe DKA. Type 2 diabetes mellitus, uncontrolled with hyperglycemia with renal complication Significantly high beta hydroxybutyric acid. Undetectable bicarb. Unable to calculate anion gap due to above. Lactic acid also elevated. Blood glucose of 700. Hyperkalemia with potassium of 7. Patient will be admitted in the stepdown unit. IV insulin with Endo tool. 2 L of normal saline bolus followed by IV normal saline 125 cc/h. Continue D5 half when the sugars are low but the gap still elevated. Initiating bicarb drip as well given severity of the DKA. Continue to monitor BMP every 4 hours, beta hydroxybutyric acid to  8 hours.  2.  Severe hyperkalemia Corrected with IV fluids as well as IV calcium gluconate and IV bicarb. Continue to monitor on telemetry.  3.  Nausea vomiting and diarrhea. The nausea and vomiting as well as abdominal pain likely associated with her DKA. CT abdomen negative for any acute abnormality. Patient reports diarrhea will rule out C. difficile.  4.  Dementia. Generalized weakness. Patient was maintained that she is fairly independent she does not remember her medication or insulin dose. With recurrent admission for DKA I suspect that patient may require more assistance. Patient continues to report generalized weakness and fatigue as well. We will get PT OT evaluation. May benefit from going at SNF short-term.  5.  Hyperlipidemia. Continue statins.  6.  Leukocytosis. Suspect this is hemoconcentration.  Monitor.  7.  AKI. Baseline serum creatinine 0.8. Currently serum creatinine 1.70. Improving with IV hydration.  Monitor.  Nutrition: NPO  DVT Prophylaxis: Subcutaneous Heparin   Advance goals of care discussion: Full code   Consults: none   Family Communication: no family was present at bedside, at the time of interview.   Disposition:  Pt is from home, admitted with severe DKA. The patient's physical exam findings of tachypnea and initial radiographic and laboratory data potassium 6.2, undetectable bicarb, AKI in the context of their chronic comorbidities like uncontrolled diabetes, dementia is felt to place them at high risk for further clinical deterioration. Pt will require inpatient hospital care spanning beyond 2 midnights. Discharge to SNF, in 3-4 days.  The patient is critically ill with multiple organ systems failure and requires high complexity decision making for assessment and support, frequent evaluation and titration of therapies. Critical Care Time devoted to patient care services described in this note is 35 minutes   Author: Lynden Oxford,  MD [facility] 10/14/2019 6:37 PM   To reach On-call, see care teams to locate the attending and reach out to them via [email or web address]. If 7PM-7AM, please contact night-coverage If you still have difficulty reaching the attending provider, please page the [facility] (Director on Call) for [facility] on amion for assistance.

## 2019-10-15 LAB — LACTIC ACID, PLASMA: Lactic Acid, Venous: 1.9 mmol/L (ref 0.5–1.9)

## 2019-10-15 LAB — BASIC METABOLIC PANEL
Anion gap: 7 (ref 5–15)
Anion gap: 5 (ref 5–15)
BUN: 33 mg/dL — ABNORMAL HIGH (ref 8–23)
BUN: 32 mg/dL — ABNORMAL HIGH (ref 8–23)
CO2: 21 mmol/L — ABNORMAL LOW (ref 22–32)
CO2: 25 mmol/L (ref 22–32)
Calcium: 8.8 mg/dL — ABNORMAL LOW (ref 8.9–10.3)
Calcium: 8.7 mg/dL — ABNORMAL LOW (ref 8.9–10.3)
Chloride: 115 mmol/L — ABNORMAL HIGH (ref 98–111)
Chloride: 112 mmol/L — ABNORMAL HIGH (ref 98–111)
Creatinine, Ser: 1.11 mg/dL — ABNORMAL HIGH (ref 0.44–1.00)
Creatinine, Ser: 1.05 mg/dL — ABNORMAL HIGH (ref 0.44–1.00)
GFR calc Af Amer: 54 mL/min — ABNORMAL LOW (ref >60)
GFR calc Af Amer: 58 mL/min — ABNORMAL LOW (ref >60)
GFR calc non Af Amer: 47 mL/min — ABNORMAL LOW (ref >60)
GFR calc non Af Amer: 50 mL/min — ABNORMAL LOW (ref >60)
Glucose, Bld: 218 mg/dL — ABNORMAL HIGH (ref 70–99)
Glucose, Bld: 148 mg/dL — ABNORMAL HIGH (ref 70–99)
Potassium: 3.4 mmol/L — ABNORMAL LOW (ref 3.5–5.1)
Potassium: 3.4 mmol/L — ABNORMAL LOW (ref 3.5–5.1)
Sodium: 143 mmol/L (ref 135–145)
Sodium: 142 mmol/L (ref 135–145)

## 2019-10-15 LAB — CBC WITH DIFFERENTIAL/PLATELET
Abs Immature Granulocytes: 0.25 10*3/uL — ABNORMAL HIGH (ref 0.00–0.07)
Basophils Absolute: 0 10*3/uL (ref 0.0–0.1)
Basophils Relative: 0 %
Eosinophils Absolute: 0 10*3/uL (ref 0.0–0.5)
Eosinophils Relative: 0 %
HCT: 29.7 % — ABNORMAL LOW (ref 36.0–46.0)
Hemoglobin: 9.8 g/dL — ABNORMAL LOW (ref 12.0–15.0)
Immature Granulocytes: 1 %
Lymphocytes Relative: 9 %
Lymphs Abs: 1.8 10*3/uL (ref 0.7–4.0)
MCH: 30.2 pg (ref 26.0–34.0)
MCHC: 33 g/dL (ref 30.0–36.0)
MCV: 91.7 fL (ref 80.0–100.0)
Monocytes Absolute: 1.7 10*3/uL — ABNORMAL HIGH (ref 0.1–1.0)
Monocytes Relative: 8 %
Neutro Abs: 16.8 10*3/uL — ABNORMAL HIGH (ref 1.7–7.7)
Neutrophils Relative %: 82 %
Platelets: 238 10*3/uL (ref 150–400)
RBC: 3.24 MIL/uL — ABNORMAL LOW (ref 3.87–5.11)
RDW: 12.4 % (ref 11.5–15.5)
WBC: 20.5 10*3/uL — ABNORMAL HIGH (ref 4.0–10.5)
nRBC: 0 % (ref 0.0–0.2)

## 2019-10-15 LAB — GLUCOSE, CAPILLARY
Glucose-Capillary: 129 mg/dL — ABNORMAL HIGH (ref 70–99)
Glucose-Capillary: 139 mg/dL — ABNORMAL HIGH (ref 70–99)
Glucose-Capillary: 145 mg/dL — ABNORMAL HIGH (ref 70–99)
Glucose-Capillary: 146 mg/dL — ABNORMAL HIGH (ref 70–99)
Glucose-Capillary: 157 mg/dL — ABNORMAL HIGH (ref 70–99)
Glucose-Capillary: 162 mg/dL — ABNORMAL HIGH (ref 70–99)
Glucose-Capillary: 164 mg/dL — ABNORMAL HIGH (ref 70–99)
Glucose-Capillary: 176 mg/dL — ABNORMAL HIGH (ref 70–99)
Glucose-Capillary: 176 mg/dL — ABNORMAL HIGH (ref 70–99)
Glucose-Capillary: 208 mg/dL — ABNORMAL HIGH (ref 70–99)
Glucose-Capillary: 236 mg/dL — ABNORMAL HIGH (ref 70–99)
Glucose-Capillary: 238 mg/dL — ABNORMAL HIGH (ref 70–99)
Glucose-Capillary: 254 mg/dL — ABNORMAL HIGH (ref 70–99)

## 2019-10-15 LAB — MAGNESIUM
Magnesium: 2 mg/dL (ref 1.7–2.4)
Magnesium: 1.8 mg/dL (ref 1.7–2.4)

## 2019-10-15 LAB — PHOSPHORUS: Phosphorus: 1.7 mg/dL — ABNORMAL LOW (ref 2.5–4.6)

## 2019-10-15 LAB — BETA-HYDROXYBUTYRIC ACID
Beta-Hydroxybutyric Acid: 5.32 mmol/L — ABNORMAL HIGH (ref 0.05–0.27)
Beta-Hydroxybutyric Acid: 0.63 mmol/L — ABNORMAL HIGH (ref 0.05–0.27)

## 2019-10-15 LAB — VITAMIN B12: Vitamin B-12: 1325 pg/mL — ABNORMAL HIGH (ref 180–914)

## 2019-10-15 LAB — PREALBUMIN: Prealbumin: 13.5 mg/dL — ABNORMAL LOW (ref 18–38)

## 2019-10-15 MED ORDER — CHLORHEXIDINE GLUCONATE CLOTH 2 % EX PADS
6.0000 | MEDICATED_PAD | Freq: Every day | CUTANEOUS | Status: DC
  Administered 2019-10-16 (×2): 6 via TOPICAL

## 2019-10-15 MED ORDER — INSULIN ASPART 100 UNIT/ML ~~LOC~~ SOLN
0.0000 [IU] | SUBCUTANEOUS | Status: DC
  Administered 2019-10-15: 1 [IU] via SUBCUTANEOUS
  Filled 2019-10-15: qty 1

## 2019-10-15 MED ORDER — INSULIN ASPART 100 UNIT/ML ~~LOC~~ SOLN: 0.0000 [IU] | Freq: Every day | SUBCUTANEOUS | Status: DC

## 2019-10-15 MED ORDER — MEMANTINE HCL 5 MG PO TABS
5.0000 mg | ORAL_TABLET | Freq: Every evening | ORAL | Status: DC
  Administered 2019-10-15 – 2019-10-17 (×3): 5 mg via ORAL
  Filled 2019-10-15 (×3): qty 1

## 2019-10-15 MED ORDER — CHLORHEXIDINE GLUCONATE CLOTH 2 % EX PADS: 6.0000 | MEDICATED_PAD | Freq: Every day | CUTANEOUS | Status: DC

## 2019-10-15 MED ORDER — HYDRALAZINE HCL 50 MG PO TABS
50.0000 mg | ORAL_TABLET | Freq: Three times a day (TID) | ORAL | Status: DC
  Administered 2019-10-15 – 2019-10-18 (×9): 50 mg via ORAL
  Filled 2019-10-15 (×9): qty 1

## 2019-10-15 MED ORDER — ENSURE MAX PROTEIN PO LIQD
11.0000 [oz_av] | Freq: Two times a day (BID) | ORAL | Status: DC
  Administered 2019-10-16 – 2019-10-17 (×4): 11 [oz_av] via ORAL
  Filled 2019-10-15: qty 330

## 2019-10-15 MED ORDER — INSULIN GLARGINE 100 UNIT/ML ~~LOC~~ SOLN
30.0000 [IU] | SUBCUTANEOUS | Status: DC
  Administered 2019-10-15: 30 [IU] via SUBCUTANEOUS
  Filled 2019-10-15: qty 0.3

## 2019-10-15 MED ORDER — ISOSORBIDE MONONITRATE ER 30 MG PO TB24
30.0000 mg | ORAL_TABLET | Freq: Every day | ORAL | Status: DC
  Administered 2019-10-15 – 2019-10-18 (×4): 30 mg via ORAL
  Filled 2019-10-15 (×4): qty 1

## 2019-10-15 MED ORDER — MUPIROCIN 2 % EX OINT
1.0000 "application " | TOPICAL_OINTMENT | Freq: Two times a day (BID) | CUTANEOUS | Status: DC
  Administered 2019-10-15 – 2019-10-17 (×6): 1 via NASAL
  Filled 2019-10-15 (×2): qty 22

## 2019-10-15 MED ORDER — INSULIN ASPART 100 UNIT/ML ~~LOC~~ SOLN
0.0000 [IU] | Freq: Three times a day (TID) | SUBCUTANEOUS | Status: DC
  Filled 2019-10-15: qty 1

## 2019-10-15 MED ORDER — DONEPEZIL HCL 5 MG PO TABS
5.0000 mg | ORAL_TABLET | Freq: Every day | ORAL | Status: DC
  Administered 2019-10-15 – 2019-10-17 (×3): 5 mg via ORAL
  Filled 2019-10-15 (×4): qty 1

## 2019-10-15 MED ORDER — INSULIN ASPART 100 UNIT/ML ~~LOC~~ SOLN
0.0000 [IU] | SUBCUTANEOUS | Status: DC
  Administered 2019-10-15: 5 [IU] via SUBCUTANEOUS
  Filled 2019-10-15: qty 1

## 2019-10-15 MED ORDER — LACTATED RINGERS IV SOLN: INTRAVENOUS | Status: DC

## 2019-10-15 MED ORDER — PRAVASTATIN SODIUM 20 MG PO TABS
40.0000 mg | ORAL_TABLET | Freq: Every day | ORAL | Status: DC
  Administered 2019-10-15 – 2019-10-17 (×3): 40 mg via ORAL
  Filled 2019-10-15 (×3): qty 2

## 2019-10-15 MED ORDER — MAGNESIUM OXIDE 400 (241.3 MG) MG PO TABS
400.0000 mg | ORAL_TABLET | Freq: Every day | ORAL | Status: DC
  Administered 2019-10-15 – 2019-10-18 (×4): 400 mg via ORAL
  Filled 2019-10-15 (×4): qty 1

## 2019-10-15 MED ORDER — K PHOS MONO-SOD PHOS DI & MONO 155-852-130 MG PO TABS
500.0000 mg | ORAL_TABLET | Freq: Three times a day (TID) | ORAL | Status: DC
  Administered 2019-10-15 (×3): 500 mg via ORAL
  Filled 2019-10-15 (×4): qty 2

## 2019-10-15 MED ORDER — CITALOPRAM HYDROBROMIDE 20 MG PO TABS
20.0000 mg | ORAL_TABLET | Freq: Every day | ORAL | Status: DC
  Administered 2019-10-15 – 2019-10-18 (×4): 20 mg via ORAL
  Filled 2019-10-15 (×4): qty 1

## 2019-10-15 NOTE — Progress Notes (Signed)
Patient transitioned off the insulin infusion this shift. Patient slightly altered, oriented to person, place, and time but kept forgetting why she was in the hospital. She would repeatedly ask this RN why she had been admitted. BP improved after MD restarted patient's home medications. Patient up to chair with RN and NT but needed a two person assist and was very weak. PT saw patient today. Patient did have liquid bowel movement but it was mixed with urine and not suitable for testing per lab. Notified MD who will extend testing period for another 24 hours.  Patient transferred to room 158. Report called to Bet RN on 1A. All belongings sent with patient. Per patient request, this RN notified patient's sister, Ms. Doristine Counter, about transfer.

## 2019-10-15 NOTE — Evaluation (Signed)
Physical Therapy Evaluation Patient Details Name: Vicki [name] MRN: [id number] DOB: [birth year] Today's Date: 10/15/2019   History of Present Illness  Vicki [name] is an 78yoF who comes to [facility] c nausea, vomitting, weakness; per EMS pt had near syncopal episode while in their care. Recently in hospital DC on 10/10/2019 for DKA. PMH: uncontrolled diabetes with recurrent DKA, GERD, HTN, mild dementia, CVA, recent DKA,  Clinical Impression  Pt admitted with above diagnosis. Pt currently with functional limitations due to the deficits listed below (see "PT Problem List"). Upon entry, pt in recliner, awake and agreeable to participate. The pt is alert and oriented x4, pleasant, conversational, and generally a good historian. Pt reports her familial support at home is more limited now due to this members medical limitations. Pt reports to feel quite poor, cites ABD pain. Has left knee 'stiffness' with mobility that detracts from gait control. Pt has difficulty with maintaining balance with RW, several posterior LOB with awareness and delayed onset correction, requiring author's physical intervention to prevent fall to floor. MinGuard assist for transfers. MinA for several LOB of recovery. Functional mobility assessment demonstrates increased effort/time requirements, poor tolerance, and need for physical assistance, whereas the patient performed these at a higher level of independence PTA. Pt [name] benefit from skilled PT intervention to increase independence and safety with basic mobility in preparation for discharge to the venue listed below.       Follow Up Recommendations SNF;Supervision for mobility/OOB    Equipment Recommendations  Rolling walker with 5" wheels    Recommendations for Other Services       Precautions / Restrictions Precautions Precautions: Fall Restrictions Weight Bearing Restrictions: No      Mobility  Bed Mobility               General bed mobility comments: in  recliner upon entry  Transfers Overall transfer level: Needs assistance Equipment used: Rolling walker (2 wheeled) Transfers: Sit to/from Stand Sit to Stand: Min guard            Ambulation/Gait Ambulation/Gait assistance: Min Chemical engineer (Feet): 20 Feet Assistive device: Rolling walker (2 wheeled) Gait Pattern/deviations: WFL(Within Functional Limits)     General Gait Details: Left knee instability, difficulty with trunk control, several posterio sway LOB pulling walker with her, minA to correct and avoid fall  Stairs   Stairs assistance: (unsafe to attempts)        Wheelchair Mobility    Modified Rankin (Stroke Patients Only)       Balance Overall balance assessment: Needs assistance   Sitting balance-Leahy Scale: Good     Standing balance support: During functional activity;Bilateral upper extremity supported Standing balance-Leahy Scale: Poor                               Pertinent Vitals/Pain Pain Assessment: (Left knee stiffness)    Home Living Family/patient expects to be discharged to:: Private residence Living Arrangements: Other relatives Available Help at Discharge: Family;Available PRN/intermittently Type of Home: House Home Access: Stairs to enter Entrance Stairs-Rails: Right Entrance Stairs-Number of Steps: 4 Home Layout: One level Home Equipment: Cane - single point      Prior Function Level of Independence: Independent with assistive device(s)         Comments: Pt reports she has had pain in R knee since fall 3wks ago     Hand Dominance        Extremity/Trunk Assessment  Lower Extremity Assessment Lower Extremity Assessment: (Instability of left knee)       Communication   Communication: No difficulties  Cognition Arousal/Alertness: Awake/alert Behavior During Therapy: WFL for tasks assessed/performed;Impulsive Overall Cognitive Status: Impaired/Different from baseline Area of  Impairment: Safety/judgement                         Safety/Judgement: Decreased awareness of safety;Decreased awareness of deficits            General Comments      Exercises     Assessment/Plan    PT Assessment Patient needs continued PT services  PT Problem List Decreased strength;Decreased activity tolerance;Decreased range of motion;Decreased balance;Decreased mobility;Decreased coordination;Decreased knowledge of use of DME;Decreased safety awareness;Decreased cognition       PT Treatment Interventions DME instruction;Gait training;Stair training;Therapeutic activities;Functional mobility training;Therapeutic exercise;Balance training;Neuromuscular re-education;Patient/family education    PT Goals (Current goals can be found in the Care Plan section)  Acute Rehab PT Goals Patient Stated Goal: get back to walking better and control her blood sugars PT Goal Formulation: With patient Time For Goal Achievement: 10/29/19 Potential to Achieve Goals: Fair    Frequency Min 2X/week   Barriers to discharge Decreased caregiver support;Inaccessible home environment      Co-evaluation               AM-PAC PT "6 Clicks" Mobility  Outcome Measure Help needed turning from your back to your side while in a flat bed without using bedrails?: A Little Help needed moving from lying on your back to sitting on the side of a flat bed without using bedrails?: A Little Help needed moving to and from a bed to a chair (including a wheelchair)?: A Little Help needed standing up from a chair using your arms (e.g., wheelchair or bedside chair)?: A Little Help needed to walk in hospital room?: A Little Help needed climbing 3-5 steps with a railing? : A Lot 6 Click Score: 17    End of Session Equipment Utilized During Treatment: Gait belt Activity Tolerance: Patient tolerated treatment well Patient left: with call bell/phone within reach;in chair Nurse Communication:  Mobility status PT Visit Diagnosis: Muscle weakness (generalized) (M62.81);Difficulty in walking, not elsewhere classified (R26.2);Unsteadiness on feet (R26.81);Other abnormalities of gait and mobility (R26.89)    Time: 1610-9604 PT Time Calculation (min) (ACUTE ONLY): 16 min   Charges:   PT Evaluation $PT Eval Moderate Complexity: 1 Mod          6:39 PM, 10/15/19 Etta Grandchild, PT, DPT Physical Therapist - [facility]  [phone] ([place])   Deannie Resetar C 10/15/2019, 6:34 PM

## 2019-10-15 NOTE — Progress Notes (Signed)
[facility] Progress Note  Patient: Vicki [name]    ZLD:[id number]  DOA: 10/14/2019     Date of Service: the patient was seen and examined on 10/15/2019  Chief Complaint  Patient presents with  . Weakness  . Hyperglycemia   Brief hospital course:  Vicki [name] is a 79 y.o. female with Past medical history of uncontrolled diabetes with recurrent DKA, GERD, HTN, mild dementia. Presented with DKA likely from noncompliance with medication.  Currently further plan is continue current care.  Assessment and Plan: 1.  Severe DKA. Type 2 diabetes mellitus, uncontrolled with hyperglycemia with renal complication Significantly high beta hydroxybutyric acid. Undetectable bicarb. Unable to calculate anion gap due to above. Lactic acid also elevated. Blood glucose of 700. Hyperkalemia  Treated with Endo tool and IV fluids.  Anion gap closed.  Currently back on basal bolus regimen.  Monitor.  2.  Severe hyperkalemia Corrected with IV fluids as well as IV calcium gluconate and IV bicarb. Continue to monitor on telemetry.  3.  Nausea vomiting and diarrhea. The nausea and vomiting as well as abdominal pain likely associated with her DKA. CT abdomen negative for any acute abnormality. Patient reports diarrhea will rule out C. difficile.  4.  Dementia. Generalized weakness. Patient maintained that she is fairly independent she does not remember her medication or insulin dose. With recurrent admission for DKA I suspect that patient may require more assistance. Patient continues to report generalized weakness and fatigue as well. We will get PT OT evaluation. May benefit from going at SNF short-term.  5.  Hyperlipidemia. Continue statins.  6.  Leukocytosis. Suspect this is hemoconcentration.  Monitor. Rule out C. difficile.  Unable to collect clean sample so far.  7.  AKI. Baseline serum creatinine 0.8. Currently serum creatinine 1.70. Improving with IV hydration.   Monitor.  Diet: Carb modified diet DVT Prophylaxis: Subcutaneous Heparin    Advance goals of care discussion: Full code  Family Communication: no family was present at bedside, at the time of interview.   Disposition:  Pt is from home, admitted with DKA, still has hypoglycemia, she does not think her family can support her at home if she needed help with her care. Patient reported her cousin is unable to and her sisters don't have time to help patient, which likely is the cause for patient's recurrent DKA and noncompliancem  which precludes a safe discharge. Discharge to SNF, when bed available.  Subjective: Feeling better.  No nausea no vomiting.  No fever no chills.  No chest pain abdominal pain.  Reports diarrhea.  Physical Exam: General:  alert oriented to time, place, and person.  Appear in mild distress, affect appropriate Eyes: PERRL ENT: Oral Mucosa Clear, moist  Neck: no JVD,  Cardiovascular: S1 and S2 Present, no Murmur,  Respiratory: good respiratory effort, Bilateral Air entry equal and Decreased, no Crackles, no wheezes Abdomen: Bowel Sound present, Soft and no tenderness,  Skin: no rash Extremities: no Pedal edema, no calf tenderness Neurologic: without any new focal findings  Gait not checked due to patient safety concerns  Vitals:   10/15/19 1600 10/15/19 1637 10/15/19 1700 10/15/19 1744  BP: (!) 129/54  (!) 144/65 138/78  Pulse: 81  70 85  Resp: (!) 22  (!) 27 16  Temp:  99.9 F (37.7 C)  98.6 F (37 C)  TempSrc:  Oral  Oral  SpO2: 100%  100% 99%  Weight:      Height:  Intake/Output Summary (Last 24 hours) at 10/15/2019 1909 Last data filed at 10/15/2019 1400 Gross per 24 hour  Intake 4806.22 ml  Output 300 ml  Net 4506.22 ml   Filed Weights   10/14/19 1312 10/14/19 1700  Weight: 64.4 kg 63.8 kg    Data Reviewed: I have personally reviewed and interpreted daily labs, tele strips, imagings as discussed above. I reviewed all nursing notes,  pharmacy notes, vitals, pertinent old records I have discussed plan of care as described above with RN and patient/family.  CBC: Recent Labs  Lab 10/14/19 1313 10/15/19 0324  WBC 21.0* 20.5*  NEUTROABS  --  16.8*  HGB 12.3 9.8*  HCT 39.3 29.7*  MCV 98.0 91.7  PLT 288 238   Basic Metabolic Panel: Recent Labs  Lab 10/14/19 1313 10/14/19 1313 10/14/19 1720 10/14/19 2112 10/14/19 2300 10/15/19 0324 10/15/19 0847  NA 132*   < > 138 142 142 143 142  K 6.2*   < > 4.9 3.8 3.6 3.4* 3.4*  CL 98   < > 104 114* 113* 115* 112*  CO2 <7*   < > <7* 11* 17* 21* 25  GLUCOSE 748*   < > 578* 333* 283* 218* 148*  BUN 42*   < > 42* 38* 37* 33* 32*  CREATININE 1.70*   < > 1.68* 1.50* 1.37* 1.11* 1.05*  CALCIUM 10.1   < > 10.2 9.0 9.3 8.8* 8.7*  MG 2.5*  --   --   --   --  2.0 1.8  PHOS  --   --   --   --   --   --  1.7*   < > = values in this interval not displayed.    Studies: No results found.  Scheduled Meds: . aspirin EC  81 mg Oral Daily  . Chlorhexidine Gluconate Cloth  6 each Topical Q0600  . citalopram  20 mg Oral Daily  . donepezil  5 mg Oral QHS  . heparin  5,000 Units Subcutaneous Q8H  . hydrALAZINE  50 mg Oral Q8H  . insulin aspart  0-9 Units Subcutaneous Q4H  . insulin glargine  30 Units Subcutaneous Q24H  . isosorbide mononitrate  30 mg Oral Daily  . magnesium oxide  400 mg Oral Daily  . memantine  5 mg Oral QPM  . mupirocin ointment  1 application Nasal BID  . phosphorus  500 mg Oral TID  . pravastatin  40 mg Oral q1800  . [START ON 10/16/2019] Ensure Max Protein  11 oz Oral BID BM   Continuous Infusions: . lactated ringers 75 mL/hr at 10/15/19 1400   PRN Meds: dextrose  Time spent: 35 minutes  Author: Lynden Oxford, MD [facility] 10/15/2019 7:09 PM  To reach On-call, see care teams to locate the attending and reach out to them via [email or web address]. If 7PM-7AM, please contact night-coverage If you still have difficulty reaching the attending provider,  please page the [facility] (Director on Call) for [facility] on amion for assistance.

## 2019-10-15 NOTE — Progress Notes (Signed)
   10/15/19 1800  Clinical Encounter Type  Visited With Patient  Visit Type Initial  Referral From Patient  Consult/Referral To Chaplain  Spiritual Encounters  Spiritual Needs Prayer;Emotional  Stress Factors  Patient Stress Factors Health changes  Advance Directives (For Healthcare)  Does Patient Have a Medical Advance Directive? No  Would patient like information on creating a medical advance directive? No - Patient declined  Mental Health Advance Directives  Does Patient Have a Mental Health Advance Directive? No  Would patient like information on creating a mental health advance directive? No - Patient declined  This was a routine visit.  Patient requested for chaplain to come in after she saw chaplain making rounds. Chaplain stayed and spoke with patient and offered prayer. Patient also requests that chaplains make regular visit to her room. Chaplain informed the other chaplains in the department.   End of visit No further

## 2019-10-15 NOTE — Progress Notes (Signed)
CSW acknowledges consult for SNF workup. CSW will follow up pending PT/OT evaluations and recommendations.   CSW also acknowledges consult for medication assistance. Patient has insurance so will be unable to provide assistance.    Alfonso Ramus, [place] [phone]

## 2019-10-15 NOTE — TOC Initial Note (Signed)
Transition of Care [facility]) - Initial/Assessment Note    Patient Details  Name: Vicki [name] MRN: [id number] Date of Birth: 12-24-1940  [facility] CM/SW Contact:    Liliana Cline, LCSW Phone Number: 10/15/2019, 3:04 PM  Clinical Narrative:                CSW completed Readmission Screen with patient via phone due to Enteric Precautions. Explained CSW role. Patient reported she lives with her cousin. PCP is Dr. Tedd Sias. CSW will submit request for PCP appointment within 5-7 days of discharge. Patient said she uses non-emergent EMS for transport to appointments. Patient reported she uses [facility] and denied issues with obtaining her medications. Patient reported she has a walker and cane at home. Patient was set up with [facility] at last hospitalization last week. Patient reported she has been to [facility] in the past for rehab. Patient reported she would be open to going to a SNF if recommended as she does not think her family can support her at home if she needed help with her care. Patient reported her cousin is unable to and her sisters don't have time to help patient.  Patient reported she would like to go back to [facility] or "any nice facility." CSW waiting on PT and OT consults to see if SNF is recommended at discharge. CSW will continue to follow.    Expected Discharge Plan: Skilled Nursing Facility Barriers to Discharge: Continued Medical Work up   Patient Goals and CMS Choice        Expected Discharge Plan and Services Expected Discharge Plan: Skilled Nursing Facility       Living arrangements for the past 2 months: Single Family Home                                      Prior Living Arrangements/Services Living arrangements for the past 2 months: Single Family Home Lives with:: Relatives Patient language and need for interpreter reviewed:: Yes Do you feel safe going back to the place where you live?: Yes       Need for Family Participation in Patient Care: Yes (Comment) Care giver support system in place?: (Reports her sisters are busy and cannot provide support and that her cousin (whom she lives with) is unable.) Current home services: DME Criminal Activity/Legal Involvement Pertinent to Current Situation/Hospitalization: No - Comment as needed  Activities of Daily Living Home Assistive Devices/Equipment: Eyeglasses, Cane (specify quad or straight), Dentures (specify type) ADL Screening (condition at time of admission) Patient's cognitive ability adequate to safely complete daily activities?: Yes Is the patient deaf or have difficulty hearing?: No Does the patient have difficulty seeing, even when wearing glasses/contacts?: No Does the patient have difficulty concentrating, remembering, or making decisions?: No Patient able to express need for assistance with ADLs?: Yes Does the patient have difficulty dressing or bathing?: No Independently performs ADLs?: Yes (appropriate for developmental age) Does the patient have difficulty walking or climbing stairs?: Yes Weakness of Legs: Both Weakness of Arms/Hands: None  Permission Sought/Granted Permission sought to share information with : Facility Industrial/product designer granted to share information with : Yes, Verbal Permission Granted              Emotional Assessment   Attitude/Demeanor/Rapport: Engaged Affect (typically observed): Appropriate, Calm Orientation: : Oriented to Self, Oriented to Place, Oriented to  Time  Alcohol / Substance Use: Not Applicable Psych Involvement: No (comment)  Admission diagnosis:  DKA (diabetic ketoacidoses) ([place]) [E11.10] Generalized weakness [R53.1] Diabetic ketoacidosis without coma associated with type 2 diabetes mellitus ([facility]) [E11.10] Non-intractable vomiting with nausea, unspecified vomiting type [R11.2] Patient Active Problem List   Diagnosis Date Noted  . DKA (diabetic  ketoacidoses) ([place]) 10/14/2019  . Diabetic ketoacidosis without coma associated with diabetes mellitus due to underlying condition ([place])   . Dehydration 10/06/2019  . Generalized weakness 10/06/2019  . Acute kidney injury ([place]) 10/06/2019  . Hypertension   . Hypoglycemia 12/10/2017  . Mild dementia ([place]) 08/14/2017  . 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 NSTEMI (non-ST elevated myocardial infarction) ([place]) 10/01/2016  . 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 Diabetes ([place]) 09/29/2016  . GERD (gastroesophageal reflux disease) 09/29/2016  . Nausea and vomiting 03/24/2016  . Diffuse abdominal pain 03/24/2016  . Hypokalemia 03/24/2016  . Chest pain 03/24/2016  . Elevated troponin I level 09/26/2015  . 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[place]) 09/25/2015   PCP:  Adin Hector, MD Pharmacy:   [place], [place] [facility] [address] [place] [address] Phone: [phone] Fax: [phone]     Social Determinants of Health (SDOH) Interventions    Readmission Risk Interventions Readmission Risk Prevention Plan 10/15/2019  Transportation Screening Complete  PCP or Specialist Appt within 3-5 Days Complete  HRI or Home Care Consult Complete  Medication Review (RN Care Manager) Complete  Some recent data might be hidden

## 2019-10-15 NOTE — Progress Notes (Signed)
Inpatient Diabetes Program Recommendations  AACE/ADA: New Consensus Statement on Inpatient Glycemic Control (2015)  Target Ranges:  Prepandial:   less than 140 mg/dL      Peak postprandial:   less than 180 mg/dL (1-2 hours)      Critically ill patients:  140 - 180 mg/dL   Lab Results  Component Value Date   GLUCAP 146 (H) 10/15/2019   HGBA1C 12.6 (H) 10/07/2019    Review of Glycemic Control  Diabetes history:DM1 (per Dr. Pricilla Handler office notes)  Home DM Meds:Toujeo 30 units QHS Humalog 4 units with meals + correction scale  Current Orders:Lantus 30 units Daily Novolog sensitive Correction Scale q 4 hrs  Inpatient Diabetes Program Recommendations:   -Decrease Novolog correction to tid + hs 0-5 units -Add Novolog 3 units tid meal coverage if eats 50% meals  Thank you, [name]. Lorenz Donley, RN, MSN, CDE  Diabetes Coordinator Inpatient Glycemic Control Team Team Pager [phone] (8am-5pm) 10/15/2019 10:46 AM

## 2019-10-15 NOTE — Progress Notes (Addendum)
Initial Nutrition Assessment  DOCUMENTATION CODES:   Not applicable  INTERVENTION:  Provide Ensure Max Protein po BID, each supplement provides 150 kcal and 30 grams of protein.  NUTRITION DIAGNOSIS:   Inadequate oral intake related to decreased appetite as evidenced by per patient/family report.  GOAL:   Patient will meet greater than or equal to 90% of their needs  MONITOR:   PO intake, Supplement acceptance, Labs, Weight trends, I & O's  REASON FOR ASSESSMENT:   Malnutrition Screening Tool    ASSESSMENT:   79 year old female with PMHx of dementia, DM, HTN, GERD, hx CVA, hx MI admitted with DKA, hyperkalemia, N/V/D.   Met with patient at bedside. She reports she has had a decreased appetite and intake for at least a week now. She is unable to describe exactly how much she has been eating. She is reporting N/V and discomfort with swallowing. According to chart she ate 50% of her lunch today. She reports she had softer foods such as pudding and gelatin. She is tolerating liquids well. Discussed other soft foods available on the menu that are high in protein. Patient amenable to drinking ONS to help meet calorie/protein needs.  UBW is 160 lbs. Patient was 70.5 kg on 09/07/2019. She is currently 63.8 kg (140.65 lbs). If weight history in chart is accurate she has lost 6.7 kg (9.5% body weight) over the past month, which is significant for time frame. Patient is unsure about her weight trend but she did not think she has lost any weight.  Medications reviewed and include: Novolog 0-9 units Q4hrs, Lantus 30 units daily, magnesium oxide 400 mg daily, K Phos neutral 500 mg TID, LR at 75 mL/hr.  Labs reviewed: CBG 129-254, Potassium 3.4, Chloride 112, BUN 32, Creatinine 1.05, Phosphorus 1.7.  Discussed with RN.  Patient is at risk for malnutrition. Difficult to determine if she truly meets criteria as patient is uncertain about her weight trend. The weight loss was acute but muscle  depletion noted on exam may be more chronic. Will continue to monitor.  NUTRITION - FOCUSED PHYSICAL EXAM:    Most Recent Value  Orbital Region  No depletion  Upper Arm Region  No depletion  Thoracic and Lumbar Region  No depletion  Buccal Region  No depletion  Temple Region  No depletion  Clavicle Bone Region  Mild depletion  Clavicle and Acromion Bone Region  Mild depletion  Scapular Bone Region  No depletion  Dorsal Hand  Mild depletion  Patellar Region  Mild depletion  Anterior Thigh Region  Mild depletion  Posterior Calf Region  Mild depletion  Edema (RD Assessment)  None  Hair  Reviewed  Eyes  Reviewed  Mouth  Reviewed  Skin  Reviewed  Nails  Reviewed     Diet Order:   Diet Order            Diet Carb Modified Fluid consistency: Thin; Room service appropriate? Yes  Diet effective now             EDUCATION NEEDS:   Not appropriate for education at this time  Skin:  Skin Assessment: Reviewed RN Assessment  Last BM:  10/15/2019 medium type 6  Height:   Ht Readings from Last 1 Encounters:  10/14/19 '5\' 4"'$  (1.626 m)   Weight:   Wt Readings from Last 1 Encounters:  10/14/19 63.8 kg   Ideal Body Weight:  54.5 kg  BMI:  Body mass index is 24.14 kg/m.  Estimated Nutritional Needs:  Kcal:  1500-1700  Protein:  75-85 grams  Fluid:  1.5-1.7 L/day  Jacklynn Barnacle, MS, RD, LDN Pager number available on Amion

## 2019-10-16 LAB — BASIC METABOLIC PANEL
Anion gap: 8 (ref 5–15)
BUN: 21 mg/dL (ref 8–23)
CO2: 25 mmol/L (ref 22–32)
Calcium: 7.9 mg/dL — ABNORMAL LOW (ref 8.9–10.3)
Chloride: 105 mmol/L (ref 98–111)
Creatinine, Ser: 0.78 mg/dL (ref 0.44–1.00)
GFR calc Af Amer: >60 mL/min (ref >60)
GFR calc non Af Amer: >60 mL/min (ref >60)
Glucose, Bld: 316 mg/dL — ABNORMAL HIGH (ref 70–99)
Potassium: 3.2 mmol/L — ABNORMAL LOW (ref 3.5–5.1)
Sodium: 138 mmol/L (ref 135–145)

## 2019-10-16 LAB — GLUCOSE, CAPILLARY
Glucose-Capillary: 137 mg/dL — ABNORMAL HIGH (ref 70–99)
Glucose-Capillary: 147 mg/dL — ABNORMAL HIGH (ref 70–99)
Glucose-Capillary: 187 mg/dL — ABNORMAL HIGH (ref 70–99)
Glucose-Capillary: 197 mg/dL — ABNORMAL HIGH (ref 70–99)
Glucose-Capillary: 270 mg/dL — ABNORMAL HIGH (ref 70–99)
Glucose-Capillary: 284 mg/dL — ABNORMAL HIGH (ref 70–99)
Glucose-Capillary: 302 mg/dL — ABNORMAL HIGH (ref 70–99)
Glucose-Capillary: 41 mg/dL — CL (ref 70–99)

## 2019-10-16 MED ORDER — INSULIN GLARGINE 100 UNIT/ML ~~LOC~~ SOLN
40.0000 [IU] | Freq: Every day | SUBCUTANEOUS | Status: DC
  Administered 2019-10-17 – 2019-10-18 (×2): 40 [IU] via SUBCUTANEOUS
  Filled 2019-10-16 (×3): qty 0.4

## 2019-10-16 MED ORDER — INSULIN ASPART 100 UNIT/ML ~~LOC~~ SOLN: 0.0000 [IU] | SUBCUTANEOUS | Status: DC

## 2019-10-16 MED ORDER — ENOXAPARIN SODIUM 40 MG/0.4ML ~~LOC~~ SOLN
40.0000 mg | SUBCUTANEOUS | Status: DC
  Administered 2019-10-16 – 2019-10-18 (×3): 40 mg via SUBCUTANEOUS
  Filled 2019-10-16 (×3): qty 0.4

## 2019-10-16 MED ORDER — INSULIN GLARGINE 100 UNIT/ML ~~LOC~~ SOLN
35.0000 [IU] | Freq: Every day | SUBCUTANEOUS | Status: DC
  Administered 2019-10-16: 35 [IU] via SUBCUTANEOUS
  Filled 2019-10-16 (×2): qty 0.35

## 2019-10-16 MED ORDER — INSULIN ASPART 100 UNIT/ML ~~LOC~~ SOLN: 0.0000 [IU] | Freq: Every day | SUBCUTANEOUS | Status: DC

## 2019-10-16 MED ORDER — DEXTROSE 50 % IV SOLN
25.0000 g | Freq: Once | INTRAVENOUS | Status: AC
  Administered 2019-10-16: 25 g via INTRAVENOUS

## 2019-10-16 MED ORDER — INSULIN ASPART 100 UNIT/ML ~~LOC~~ SOLN
8.0000 [IU] | Freq: Three times a day (TID) | SUBCUTANEOUS | Status: DC
  Administered 2019-10-16 – 2019-10-18 (×4): 8 [IU] via SUBCUTANEOUS
  Filled 2019-10-16 (×4): qty 1

## 2019-10-16 MED ORDER — INSULIN ASPART 100 UNIT/ML ~~LOC~~ SOLN
0.0000 [IU] | Freq: Three times a day (TID) | SUBCUTANEOUS | Status: DC
  Administered 2019-10-16 (×2): 3 [IU] via SUBCUTANEOUS
  Administered 2019-10-16: 8 [IU] via SUBCUTANEOUS
  Filled 2019-10-16 (×3): qty 1

## 2019-10-16 MED ORDER — ALUM & MAG HYDROXIDE-SIMETH 200-200-20 MG/5ML PO SUSP
30.0000 mL | Freq: Four times a day (QID) | ORAL | Status: DC | PRN
  Administered 2019-10-16 – 2019-10-17 (×3): 30 mL via ORAL
  Filled 2019-10-16 (×3): qty 30

## 2019-10-16 MED ORDER — ONDANSETRON HCL 4 MG/2ML IJ SOLN
4.0000 mg | Freq: Four times a day (QID) | INTRAMUSCULAR | Status: DC | PRN
  Administered 2019-10-16 – 2019-10-17 (×2): 4 mg via INTRAVENOUS
  Filled 2019-10-16 (×2): qty 2

## 2019-10-16 MED ORDER — POTASSIUM CHLORIDE CRYS ER 20 MEQ PO TBCR
40.0000 meq | EXTENDED_RELEASE_TABLET | ORAL | Status: AC
  Administered 2019-10-16 (×2): 40 meq via ORAL
  Filled 2019-10-16 (×2): qty 2

## 2019-10-16 MED ORDER — INSULIN ASPART 100 UNIT/ML ~~LOC~~ SOLN
4.0000 [IU] | Freq: Three times a day (TID) | SUBCUTANEOUS | Status: DC
  Administered 2019-10-16 (×2): 4 [IU] via SUBCUTANEOUS
  Filled 2019-10-16 (×2): qty 1

## 2019-10-16 MED ORDER — INSULIN ASPART 100 UNIT/ML ~~LOC~~ SOLN
0.0000 [IU] | SUBCUTANEOUS | Status: DC
  Administered 2019-10-16: 8 [IU] via SUBCUTANEOUS

## 2019-10-16 NOTE — Progress Notes (Signed)
Spoke with Vicki [name] pt with FBS of 41. NP to place orders.

## 2019-10-16 NOTE — Evaluation (Signed)
Occupational Therapy Evaluation Patient Details Name: Vicki [name] MRN: [id number] DOB: 03/15/1941 Today's Date: 10/16/2019    History of Present Illness Vicki [name] is an 79yoF who comes to [facility] c nausea, vomitting, weakness; per EMS pt had near syncopal episode while in their care. Recently in hospital DC on 10/10/2019 for DKA. PMH: uncontrolled diabetes with recurrent DKA, GERD, HTN, mild dementia, CVA, recent DKA,   Clinical Impression   Vicki [name] was seen for OT evaluation this date. Prior to hospital admission, pt was modified independent with mobility and ADLs, requiring assistance from family for iADLS such as grocery shopping and medication management. Pt has had multiple hospital admissions and a history of falls. Pt lives in a single story home with her 16 year old cousin who pt states would not be able to proved assistance if needed. Pt presents to acute OT demonstrating impaired ADL performance, functional cognition, and functional mobility 2/2 decreased safety awareness, impulsivity, and impaired balance. Pt currently requires close SBA for all functional mobility and ADLs and currently does not have the support at home to provide appropriate supervision needed.  Pt would benefit from skilled OT to address noted impairments and functional limitations (see below for any additional details) in order to maximize safety and independence while minimizing falls risk and caregiver burden. Upon hospital discharge, recommend STR to maximize pt safety and return to PLOF.    Follow Up Recommendations  SNF    Equipment Recommendations       Recommendations for Other Services       Precautions / Restrictions Precautions Precautions: Fall Restrictions Weight Bearing Restrictions: No      Mobility Bed Mobility Overal bed mobility: Modified Independent             General bed mobility comments: HOB elevated sup>sit  Transfers Overall transfer level: Needs assistance Equipment  used: Rolling walker (2 wheeled) Transfers: Sit to/from Stand Sit to Stand: Min guard         General transfer comment: Pt able to rise with good confidence - close SBA for safety 2/2 impulsive moevements and decreased knowledge of safe RW techniques    Balance Overall balance assessment: Needs assistance Sitting-balance support: No upper extremity supported;Feet supported Sitting balance-Leahy Scale: Good     Standing balance support: During functional activity;Bilateral upper extremity supported Standing balance-Leahy Scale: Fair                             ADL either performed or assessed with clinical judgement   ADL Overall ADL's : Needs assistance/impaired                                       General ADL Comments: SBA to adjust B socks seated EOB. SBA + RW toileting at commonde. SBA + RW handwashing standing sinkside - pt required MOD VCs for safe RW use t/o functional mobility and ADLs. Close SBA + RW for in room functional mobility - decreasing to CGA when pt twice picked up RW to navigate around furniture.       Vision Baseline Vision/History: Wears glasses Wears Glasses: At all times Patient Visual Report: No change from baseline       Perception     Praxis      Pertinent Vitals/Pain Pain Assessment: No/denies pain     Hand Dominance Right   Extremity/Trunk  Assessment Upper Extremity Assessment Upper Extremity Assessment: Overall WFL for tasks assessed   Lower Extremity Assessment Lower Extremity Assessment: Overall WFL for tasks assessed       Communication Communication Communication: No difficulties   Cognition Arousal/Alertness: Awake/alert Behavior During Therapy: WFL for tasks assessed/performed;Impulsive Overall Cognitive Status: Impaired/Different from baseline Area of Impairment: Safety/judgement;Memory                     Memory: Decreased short-term memory   Safety/Judgement: Decreased awareness  of safety;Decreased awareness of deficits     General Comments: Pt entered bathroom to use the toilet then after turning to sit on commode proceeded to exit room until prompted by OT if needed to use commode and pt stated "oh that's right."   General Comments       Exercises Exercises: Other exercises Other Exercises Other Exercises: Pt educated re: falls prevention, medication management, OT role, discharge recommendations, safe RW use, and importance of supervision c OOB activity Other Exercises: bed mobility, sit<>stand x2, toileting at commode, handwashing standing sinkside   Shoulder Instructions      Home Living Family/patient expects to be discharged to:: Private residence Living Arrangements: Other relatives(81 yo cousin) Available Help at Discharge: Family;Available PRN/intermittently Type of Home: House Home Access: Stairs to enter [employer] of Steps: 4 Entrance Stairs-Rails: Right Home Layout: One level     Bathroom Shower/Tub: Occupational psychologist: Handicapped height Bathroom Accessibility: Yes How Accessible: Accessible via walker Home Equipment: [place] - single point;Shower seat;Grab bars - tub/shower;Hand held shower head          Prior Functioning/Environment Level of Independence: Independent with assistive device(s)        Comments: Pt reports she "likes to do things by myself" and is independent with ADLs. Pt walks to grocery store or has her sister drive her, pt does not drive. Pt reports "bad days" when her "medicine is bad." Pt has history of falls.          OT Problem List: Impaired balance (sitting and/or standing);Decreased safety awareness;Decreased knowledge of use of DME or AE      OT Treatment/Interventions: Self-care/ADL training;Therapeutic exercise;Neuromuscular education;Energy conservation;DME and/or AE instruction;Therapeutic activities;Cognitive remediation/compensation;Patient/family education;Balance  training    OT Goals(Current goals can be found in the care plan section) Acute Rehab OT Goals Patient Stated Goal: get back to walking better and control her blood sugars OT Goal Formulation: With patient Time For Goal Achievement: 10/30/19 Potential to Achieve Goals: Good ADL Goals Pt Will Perform Grooming: with supervision;standing(c LRAD PRN c no cues for safety) Pt Will Perform Toileting - Clothing Manipulation and hygiene: with modified independence;sit to/from stand(c LRAD PRN) Additional ADL Goal #1: Pt will independently verbalize plan to implement x3 strategies for falls prevention  OT Frequency: Min 1X/week   Barriers to D/C: Decreased caregiver support          Co-evaluation              AM-PAC OT "6 Clicks" Daily Activity     Outcome Measure Help from another person eating meals?: None Help from another person taking care of personal grooming?: A Little Help from another person toileting, which includes using toliet, bedpan, or urinal?: A Little Help from another person bathing (including washing, rinsing, drying)?: A Little Help from another person to put on and taking off regular upper body clothing?: None Help from another person to put on and taking off regular lower body clothing?: A Little  6 Click Score: 20   End of Session Equipment Utilized During Treatment: Rolling walker  Activity Tolerance: Patient tolerated treatment well Patient left: in chair;with call bell/phone within reach;with nursing/sitter in room(RN in room to set chair alarm)  OT Visit Diagnosis: Unsteadiness on feet (R26.81);Other abnormalities of gait and mobility (R26.89);Repeated falls (R29.6)                Time: 7026-3785 OT Time Calculation (min): 22 min Charges:  OT General Charges $OT Visit: 1 Visit OT Evaluation $OT Eval Moderate Complexity: 1 Mod OT Treatments $Self Care/Home Management : 8-22 mins  Kathie Dike, M.S. OTR/L  10/16/19, 9:47 AM

## 2019-10-16 NOTE — Progress Notes (Signed)
[facility] Progress Note  Patient: Vicki [name]    IDP:[id number]  DOA: 10/14/2019     Date of Service: the patient was seen and examined on 10/16/2019  Chief Complaint  Patient presents with  . Weakness  . Hyperglycemia   Brief hospital course:  Vicki [name] is a 79 y.o. female with Past medical history of uncontrolled diabetes with recurrent DKA, GERD, HTN, mild dementia. Presented with DKA likely from noncompliance with medication.  Currently further plan is continue current care.  Assessment and Plan: 1.  Severe DKA. Type 2 diabetes mellitus, uncontrolled with hyperglycemia with renal complication Significantly high beta hydroxybutyric acid. Undetectable bicarb. Unable to calculate anion gap due to above. Lactic acid also elevated. Blood glucose of 700. Hyperkalemia  Treated with Endo tool and IV fluids.  Anion gap closed.  Currently back on basal bolus regimen.  Monitor.  Dose adjusted to patient's hyperglycemia.  2.  Severe hyperkalemia Now hypokalemia Corrected with IV fluids as well as IV calcium gluconate and IV bicarb. Continue to monitor on telemetry. Replaced.  Monitor.  3.  Nausea vomiting and diarrhea. The nausea and vomiting as well as abdominal pain likely associated with her DKA. CT abdomen negative for any acute abnormality. Patient reports diarrhea will rule out C. difficile.  4.  Dementia. Generalized weakness. Patient maintained that she is fairly independent she does not remember her medication or insulin dose. With recurrent admission for DKA I suspect that patient may require more assistance.  Patient continues to report generalized weakness and fatigue as well. PT recommends SNF.  Patient willing to go to SNF as well.  5.  Hyperlipidemia. Continue statins.  6.  Leukocytosis. Suspect this is hemoconcentration.  Monitor. Rule out C. difficile.  Unable to collect clean sample so far.  7.  AKI.  Resolved.  From  prerenal Baseline serum creatinine 0.8. On admission serum creatinine 1.70. Improving with IV hydration.  Monitor.  Diet: Carb modified diet DVT Prophylaxis: Subcutaneous Heparin    Advance goals of care discussion: Full code  Family Communication: no family was present at bedside, at the time of interview.   Disposition:  Pt is from home, admitted with DKA, still has hypoglycemia, she does not think her family can support her at home if she needed help with her care. Patient reported her cousin is unable to and her sisters don't have time to help patient, which likely is the cause for patient's recurrent DKA and noncompliancem  which precludes a safe discharge. Discharge to SNF, when bed available.  Subjective: No nausea no vomiting no fever or chills.  Oral intake improving.  Had one loose BM this morning and one loose BM yesterday.  No abdominal pain.  Physical Exam: General:  alert oriented to time, place, and person.  Appear in mild distress, affect appropriate Eyes: PERRL ENT: Oral Mucosa Clear, moist  Neck: no JVD,  Cardiovascular: S1 and S2 Present, no Murmur,  Respiratory: good respiratory effort, Bilateral Air entry equal and Decreased, no Crackles, no wheezes Abdomen: Bowel Sound present, Soft and no tenderness,  Skin: no rash Extremities: no Pedal edema, no calf tenderness Neurologic: without any new focal findings  Gait not checked due to patient safety concerns  Vitals:   10/16/19 0023 10/16/19 0446 10/16/19 0912 10/16/19 1604  BP: 136/63 (!) 156/74 (!) 154/65 (!) 162/82  Pulse: 81 70 87 84  Resp: 18 16 18 17   Temp: 98.7 F (37.1 C) 98 F (36.7 C) 98 F (36.7 C)  98.6 F (37 C)  TempSrc: Oral Oral Oral Oral  SpO2: 98% 100% 100% 99%  Weight:      Height:        Intake/Output Summary (Last 24 hours) at 10/16/2019 1615 Last data filed at 10/16/2019 1346 Gross per 24 hour  Intake 2155.78 ml  Output 225 ml  Net 1930.78 ml   Filed Weights   10/14/19 1312  10/14/19 1700  Weight: 64.4 kg 63.8 kg    Data Reviewed: I have personally reviewed and interpreted daily labs, tele strips, imagings as discussed above. I reviewed all nursing notes, pharmacy notes, vitals, pertinent old records I have discussed plan of care as described above with RN and patient/family.  CBC: Recent Labs  Lab 10/14/19 1313 10/15/19 0324  WBC 21.0* 20.5*  NEUTROABS  --  16.8*  HGB 12.3 9.8*  HCT 39.3 29.7*  MCV 98.0 91.7  PLT 288 238   Basic Metabolic Panel: Recent Labs  Lab 10/14/19 1313 10/14/19 1720 10/14/19 2112 10/14/19 2300 10/15/19 0324 10/15/19 0847 10/16/19 0437  NA 132*   < > 142 142 143 142 138  K 6.2*   < > 3.8 3.6 3.4* 3.4* 3.2*  CL 98   < > 114* 113* 115* 112* 105  CO2 <7*   < > 11* 17* 21* 25 25  GLUCOSE 748*   < > 333* 283* 218* 148* 316*  BUN 42*   < > 38* 37* 33* 32* 21  CREATININE 1.70*   < > 1.50* 1.37* 1.11* 1.05* 0.78  CALCIUM 10.1   < > 9.0 9.3 8.8* 8.7* 7.9*  MG 2.5*  --   --   --  2.0 1.8  --   PHOS  --   --   --   --   --  1.7*  --    < > = values in this interval not displayed.    Studies: No results found.  Scheduled Meds: . aspirin EC  81 mg Oral Daily  . Chlorhexidine Gluconate Cloth  6 each Topical Q0600  . citalopram  20 mg Oral Daily  . donepezil  5 mg Oral QHS  . enoxaparin (LOVENOX) injection  40 mg Subcutaneous Q24H  . hydrALAZINE  50 mg Oral Q8H  . insulin aspart  0-15 Units Subcutaneous TID WC  . insulin aspart  0-5 Units Subcutaneous QHS  . insulin aspart  8 Units Subcutaneous TID WC  . [START ON 10/17/2019] insulin glargine  40 Units Subcutaneous Daily  . isosorbide mononitrate  30 mg Oral Daily  . magnesium oxide  400 mg Oral Daily  . memantine  5 mg Oral QPM  . mupirocin ointment  1 application Nasal BID  . pravastatin  40 mg Oral q1800  . Ensure Max Protein  11 oz Oral BID BM   Continuous Infusions: . lactated ringers Stopped (10/16/19 7628)   PRN Meds: dextrose  Time spent: 35  minutes  Author: Lynden Oxford, MD [facility] 10/16/2019 4:15 PM  To reach On-call, see care teams to locate the attending and reach out to them via [email or web address]. If 7PM-7AM, please contact night-coverage If you still have difficulty reaching the attending provider, please page the [facility] (Director on Call) for [facility] on amion for assistance.

## 2019-10-16 NOTE — Progress Notes (Signed)
Severe hypoglycemic event. CBG 41 Precipitating factors, poor oral intake.  D50 ordered. Sliding scale insulin placed on hold and frequent CBG monitoring every four hours initiated

## 2019-10-17 LAB — GLUCOSE, CAPILLARY
Glucose-Capillary: 236 mg/dL — ABNORMAL HIGH (ref 70–99)
Glucose-Capillary: 195 mg/dL — ABNORMAL HIGH (ref 70–99)
Glucose-Capillary: 222 mg/dL — ABNORMAL HIGH (ref 70–99)
Glucose-Capillary: 145 mg/dL — ABNORMAL HIGH (ref 70–99)
Glucose-Capillary: 236 mg/dL — ABNORMAL HIGH (ref 70–99)

## 2019-10-17 LAB — BASIC METABOLIC PANEL
Anion gap: 7 (ref 5–15)
BUN: 13 mg/dL (ref 8–23)
CO2: 26 mmol/L (ref 22–32)
Calcium: 8.4 mg/dL — ABNORMAL LOW (ref 8.9–10.3)
Chloride: 104 mmol/L (ref 98–111)
Creatinine, Ser: 0.73 mg/dL (ref 0.44–1.00)
GFR calc Af Amer: >60 mL/min (ref >60)
GFR calc non Af Amer: >60 mL/min (ref >60)
Glucose, Bld: 242 mg/dL — ABNORMAL HIGH (ref 70–99)
Potassium: 3.9 mmol/L (ref 3.5–5.1)
Sodium: 137 mmol/L (ref 135–145)

## 2019-10-17 MED ORDER — FAMOTIDINE 20 MG PO TABS
20.0000 mg | ORAL_TABLET | Freq: Two times a day (BID) | ORAL | Status: DC
  Administered 2019-10-17 – 2019-10-18 (×3): 20 mg via ORAL
  Filled 2019-10-17 (×3): qty 1

## 2019-10-17 MED ORDER — SIMETHICONE 80 MG PO CHEW
80.0000 mg | CHEWABLE_TABLET | Freq: Four times a day (QID) | ORAL | Status: DC
  Administered 2019-10-17 (×2): 80 mg via ORAL
  Filled 2019-10-17 (×7): qty 1

## 2019-10-17 NOTE — NC FL2 (Signed)
[place] LEVEL OF CARE SCREENING TOOL     IDENTIFICATION  Patient Name: Vicki [name] Birthdate: September 23, 1940 Sex: female Admission Date (Current Location): 10/14/2019  [place] and [place] Number:  Engineering geologist and Address:  [facility], [address], [place], [place] [address]      Provider Number:    Attending Physician Name and Address:  Lavina Hamman, MD  Relative Name and Phone Number:  Amado Nash [phone]    Current Level of Care: Hospital Recommended Level of Care: [place] Prior Approval Number:    Date Approved/Denied:   PASRR Number:    Discharge Plan: SNF    Current Diagnoses: Patient Active Problem List   Diagnosis Date Noted  . DKA (diabetic ketoacidoses) ([place]) 10/14/2019  . Diabetic ketoacidosis without coma associated with diabetes mellitus due to underlying condition ([place])   . Dehydration 10/06/2019  . Generalized weakness 10/06/2019  . Acute kidney injury ([place]) 10/06/2019  . Hypertension   . Hypoglycemia 12/10/2017  . Mild dementia ([place]) 08/14/2017  . 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 NSTEMI (non-ST elevated myocardial infarction) ([place]) 10/01/2016  . 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 Diabetes ([place]) 09/29/2016  . GERD (gastroesophageal reflux disease) 09/29/2016  . Nausea and vomiting 03/24/2016  . Diffuse abdominal pain 03/24/2016  . Hypokalemia 03/24/2016  . Chest pain 03/24/2016  . Elevated troponin I level 09/26/2015  . 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[place]) 09/25/2015    Orientation RESPIRATION BLADDER Height & Weight     Self, Time, Situation, Place  Normal(room air) Continent, External catheter Weight: 140 lb 10.5 oz (63.8 kg) Height:  5\' 4"  (162.6 cm)  BEHAVIORAL SYMPTOMS/MOOD NEUROLOGICAL BOWEL NUTRITION STATUS  Other (Comment)(none)   Continent    AMBULATORY STATUS COMMUNICATION OF NEEDS Skin   Limited Assist Verbally  Normal                       Personal Care Assistance Level of Assistance  Bathing, Feeding, Dressing Bathing Assistance: Limited assistance Feeding assistance: Limited assistance Dressing Assistance: Limited assistance     Functional Limitations Info  Sight, Hearing, Speech Sight Info: Impaired(wears glasses) Hearing Info: Adequate Speech Info: Adequate    SPECIAL CARE FACTORS FREQUENCY  PT (By licensed PT), OT (By licensed OT)     PT Frequency: 5 x week OT Frequency: 5 x week            Contractures Contractures Info: Not present    Additional Factors Info  Code Status Code Status Info: Full             Current Medications (10/17/2019):  This is the current hospital active medication list Current Facility-Administered Medications  Medication Dose Route Frequency Provider Last Rate Last Admin  . alum & mag hydroxide-simeth (MAALOX/MYLANTA) 200-200-20 MG/5ML suspension 30 mL  30 mL Oral Q6H PRN Lavina Hamman, MD   30 mL at 10/17/19 1150  . aspirin EC tablet 81 mg  81 mg Oral Daily Lavina Hamman, MD   81 mg at 10/17/19 0905  . Chlorhexidine Gluconate Cloth 2 % PADS 6 each  6 each Topical Q0600 Lavina Hamman, MD   6 each at 10/16/19 2110  . citalopram (CELEXA) tablet 20 mg  20 mg Oral Daily Lavina Hamman, MD   20 mg at 10/17/19 0905  . dextrose 50 % solution 0-50 mL  0-50 mL Intravenous PRN Lavina Hamman, MD      .  donepezil (ARICEPT) tablet 5 mg  5 mg Oral QHS Rolly Salter, MD   5 mg at 10/16/19 2109  . enoxaparin (LOVENOX) injection 40 mg  40 mg Subcutaneous Q24H Rolly Salter, MD   40 mg at 10/17/19 0905  . famotidine (PEPCID) tablet 20 mg  20 mg Oral BID Rolly Salter, MD   20 mg at 10/17/19 0953  . hydrALAZINE (APRESOLINE) tablet 50 mg  50 mg Oral Q8H Rolly Salter, MD   50 mg at 10/17/19 1337  . insulin aspart (novoLOG) injection 8 Units  8 Units Subcutaneous TID WC Rolly Salter, MD   8 Units at 10/17/19 1150  . insulin glargine  (LANTUS) injection 40 Units  40 Units Subcutaneous Daily Rolly Salter, MD   40 Units at 10/17/19 0905  . isosorbide mononitrate (IMDUR) 24 hr tablet 30 mg  30 mg Oral Daily Rolly Salter, MD   30 mg at 10/17/19 0905  . lactated ringers infusion   Intravenous Continuous Rolly Salter, MD 75 mL/hr at 10/17/19 0910 New Bag at 10/17/19 0910  . magnesium oxide (MAG-OX) tablet 400 mg  400 mg Oral Daily Rolly Salter, MD   400 mg at 10/17/19 0905  . memantine (NAMENDA) tablet 5 mg  5 mg Oral QPM Rolly Salter, MD   5 mg at 10/16/19 1657  . mupirocin ointment (BACTROBAN) 2 % 1 application  1 application Nasal BID Rolly Salter, MD   1 application at 10/17/19 0905  . ondansetron (ZOFRAN) injection 4 mg  4 mg Intravenous Q6H PRN Manuela Schwartz, NP   4 mg at 10/17/19 0415  . pravastatin (PRAVACHOL) tablet 40 mg  40 mg Oral q1800 Rolly Salter, MD   40 mg at 10/16/19 1657  . protein supplement (ENSURE MAX) liquid  11 oz Oral BID BM Rolly Salter, MD   11 oz at 10/17/19 1337  . simethicone (MYLICON) chewable tablet 80 mg  80 mg Oral QID Rolly Salter, MD         Discharge Medications: Please see discharge summary for a list of discharge medications.  Relevant Imaging Results:  Relevant Lab Results:   Additional Information [phone] [place], [place]

## 2019-10-17 NOTE — TOC Progression Note (Signed)
Transition of Care [facility]) - Progression Note    Patient Details  Name: Vicki [name] MRN: [id number] Date of Birth: October 16, 1940  Transition of [facility]) CM/SW Contact  Maud Deed, LCSW Phone Number:[phone] 10/17/2019, 3:43 PM  Clinical Narrative:    PT and OT recommened SNF. CSW completed FL2 but was unable to find or complete pt's PASRR because of inaccurate info. CSW did not fax out at this time.  TOC will continue to follow for discharge planning needs   Expected Discharge Plan: Skilled Nursing Facility Barriers to Discharge: Continued Medical Work up  Expected Discharge Plan and Services Expected Discharge Plan: Skilled Nursing Facility       Living arrangements for the past 2 months: Single Family Home                                       Social Determinants of Health (SDOH) Interventions    Readmission Risk Interventions Readmission Risk Prevention Plan 10/15/2019  Transportation Screening Complete  PCP or Specialist Appt within 3-5 Days Complete  HRI or Home Care Consult Complete  Medication Review (RN Care Manager) Complete  Some recent data might be hidden

## 2019-10-17 NOTE — Progress Notes (Signed)
[facility] Progress Note  Patient: Vicki [name]    EUM:[id number]  DOA: 10/14/2019     Date of Service: the patient was seen and examined on 10/17/2019  Chief Complaint  Patient presents with  . Weakness  . Hyperglycemia   Brief hospital course: Vicki [name] is a 79 y.o. female with Past medical history of uncontrolled diabetes with recurrent DKA, GERD, HTN, mild dementia. Presented with DKA likely from noncompliance with medication.  Currently further plan is continue current care.  Assessment and Plan: 1.  Severe DKA. Type 2 diabetes mellitus, uncontrolled with hyperglycemia with renal complication Significantly high beta hydroxybutyric acid. Undetectable bicarb. Unable to calculate anion gap due to above. Lactic acid also elevated. Blood glucose of 700. Hyperkalemia  Treated with Endo tool and IV fluids.  Anion gap closed.  Currently back on basal bolus regimen.  Monitor.  Dose adjusted to patient's hyperglycemia. Patient had one episode of hypoglycemia 10/16/2019 but other CBGs are actually on the higher side.  We will continue current regimen.  2.  Severe hyperkalemia Now hypokalemia Corrected with IV fluids as well as IV calcium gluconate and IV bicarb. Continue to monitor on telemetry. Replaced.  Monitor.  3.  Nausea vomiting and diarrhea. The nausea and vomiting as well as abdominal pain likely associated with her DKA. CT abdomen negative for any acute abnormality. At home but in the hospital so far she does not have any diarrhea.  C. difficile testing canceled PCR.  Tolerating oral diet.  Reports gas.  4.  Dementia. Generalized weakness. Patient maintained that she is fairly independent she does not remember her medication or insulin dose. With recurrent admission for DKA I suspect that patient may require more assistance.  Patient continues to report generalized weakness and fatigue as well. PT recommends SNF.  Patient willing to go to SNF as  well.  5.  Hyperlipidemia. Continue statins.  6.  Leukocytosis. Suspect this is hemoconcentration.  Monitor. Rule out C. difficile.  Unable to collect clean sample so far.  7.  AKI.  Resolved.  From prerenal Baseline serum creatinine 0.8. On admission serum creatinine 1.70. Improving with IV hydration.  Monitor.  Diet: Carb modified diet DVT Prophylaxis: Subcutaneous Heparin    Advance goals of care discussion: Full code  Family Communication: no family was present at bedside, at the time of interview.   Disposition:  Pt is from home, admitted with DKA, still has hypoglycemia, she does not think her family can support her at home if she needed help with her care. Patient reported her cousin is unable to and her sisters don't have time to help patient, which likely is the cause for patient's recurrent DKA and noncompliancem  which precludes a safe discharge. Discharge to SNF, when bed available.  Subjective: Reports poor p.o. intake.  Reports acid reflux and bloating.  No nausea no vomiting no fever no chills. No diarrhea. Passing gas.  Physical Exam: General:  alert oriented to time, place, and person.  Appear in mild distress, affect appropriate Eyes: PERRL ENT: Oral Mucosa Clear, moist  Neck: no JVD,  Cardiovascular: S1 and S2 Present, no Murmur,  Respiratory: good respiratory effort, Bilateral Air entry equal and Decreased, no Crackles, no wheezes Abdomen: Bowel Sound present, Soft and no tenderness,  Skin: no rash Extremities: no Pedal edema, no calf tenderness Neurologic: without any new focal findings  Gait not checked due to patient safety concerns  Vitals:   10/16/19 2251 10/16/19 2258 10/17/19 0505 10/17/19 0845  BP: (!) 100/56 (!) 155/63 (!) 161/76 (!) 153/86  Pulse: 85   81  Resp: 17   18  Temp: 99 F (37.2 C)   99.4 F (37.4 C)  TempSrc: Oral   Oral  SpO2: 98%   97%  Weight:      Height:        Intake/Output Summary (Last 24 hours) at 10/17/2019  1518 Last data filed at 10/17/2019 1404 Gross per 24 hour  Intake 1583.6 ml  Output --  Net 1583.6 ml   Filed Weights   10/14/19 1312 10/14/19 1700  Weight: 64.4 kg 63.8 kg    Data Reviewed: I have personally reviewed and interpreted daily labs, tele strips, imagings as discussed above. I reviewed all nursing notes, pharmacy notes, vitals, pertinent old records I have discussed plan of care as described above with RN and patient/family.  CBC: Recent Labs  Lab 10/14/19 1313 10/15/19 0324  WBC 21.0* 20.5*  NEUTROABS  --  16.8*  HGB 12.3 9.8*  HCT 39.3 29.7*  MCV 98.0 91.7  PLT 288 238   Basic Metabolic Panel: Recent Labs  Lab 10/14/19 1313 10/14/19 1720 10/14/19 2300 10/15/19 0324 10/15/19 0847 10/16/19 0437 10/17/19 0558  NA 132*   < > 142 143 142 138 137  K 6.2*   < > 3.6 3.4* 3.4* 3.2* 3.9  CL 98   < > 113* 115* 112* 105 104  CO2 <7*   < > 17* 21* 25 25 26   GLUCOSE 748*   < > 283* 218* 148* 316* 242*  BUN 42*   < > 37* 33* 32* 21 13  CREATININE 1.70*   < > 1.37* 1.11* 1.05* 0.78 0.73  CALCIUM 10.1   < > 9.3 8.8* 8.7* 7.9* 8.4*  MG 2.5*  --   --  2.0 1.8  --   --   PHOS  --   --   --   --  1.7*  --   --    < > = values in this interval not displayed.    Studies: No results found.  Scheduled Meds: . aspirin EC  81 mg Oral Daily  . Chlorhexidine Gluconate Cloth  6 each Topical Q0600  . citalopram  20 mg Oral Daily  . donepezil  5 mg Oral QHS  . enoxaparin (LOVENOX) injection  40 mg Subcutaneous Q24H  . famotidine  20 mg Oral BID  . hydrALAZINE  50 mg Oral Q8H  . insulin aspart  8 Units Subcutaneous TID WC  . insulin glargine  40 Units Subcutaneous Daily  . isosorbide mononitrate  30 mg Oral Daily  . magnesium oxide  400 mg Oral Daily  . memantine  5 mg Oral QPM  . mupirocin ointment  1 application Nasal BID  . pravastatin  40 mg Oral q1800  . Ensure Max Protein  11 oz Oral BID BM   Continuous Infusions: . lactated ringers 75 mL/hr at 10/17/19 0910    PRN Meds: alum & mag hydroxide-simeth, dextrose, ondansetron (ZOFRAN) IV  Time spent: 35 minutes  Author: 10/19/19, MD [facility] 10/17/2019 3:18 PM  To reach On-call, see care teams to locate the attending and reach out to them via [email or web address]. If 7PM-7AM, please contact night-coverage If you still have difficulty reaching the attending provider, please page the [facility] (Director on Call) for [facility] on amion for assistance.

## 2019-10-18 LAB — CBC WITH DIFFERENTIAL/PLATELET
Abs Immature Granulocytes: 0.01 10*3/uL (ref 0.00–0.07)
Basophils Absolute: 0 10*3/uL (ref 0.0–0.1)
Basophils Relative: 1 %
Eosinophils Absolute: 0.3 10*3/uL (ref 0.0–0.5)
Eosinophils Relative: 6 %
HCT: 34.8 % — ABNORMAL LOW (ref 36.0–46.0)
Hemoglobin: 11.3 g/dL — ABNORMAL LOW (ref 12.0–15.0)
Immature Granulocytes: 0 %
Lymphocytes Relative: 42 %
Lymphs Abs: 2.2 10*3/uL (ref 0.7–4.0)
MCH: 30.3 pg (ref 26.0–34.0)
MCHC: 32.5 g/dL (ref 30.0–36.0)
MCV: 93.3 fL (ref 80.0–100.0)
Monocytes Absolute: 0.5 10*3/uL (ref 0.1–1.0)
Monocytes Relative: 10 %
Neutro Abs: 2.1 10*3/uL (ref 1.7–7.7)
Neutrophils Relative %: 41 %
Platelets: 223 10*3/uL (ref 150–400)
RBC: 3.73 MIL/uL — ABNORMAL LOW (ref 3.87–5.11)
RDW: 12.7 % (ref 11.5–15.5)
WBC: 5.1 10*3/uL (ref 4.0–10.5)
nRBC: 0 % (ref 0.0–0.2)

## 2019-10-18 LAB — BASIC METABOLIC PANEL
Anion gap: 8 (ref 5–15)
BUN: 12 mg/dL (ref 8–23)
CO2: 29 mmol/L (ref 22–32)
Calcium: 8.9 mg/dL (ref 8.9–10.3)
Chloride: 102 mmol/L (ref 98–111)
Creatinine, Ser: 0.73 mg/dL (ref 0.44–1.00)
GFR calc Af Amer: >60 mL/min (ref >60)
GFR calc non Af Amer: >60 mL/min (ref >60)
Glucose, Bld: 185 mg/dL — ABNORMAL HIGH (ref 70–99)
Potassium: 3.9 mmol/L (ref 3.5–5.1)
Sodium: 139 mmol/L (ref 135–145)

## 2019-10-18 LAB — GLUCOSE, CAPILLARY
Glucose-Capillary: 159 mg/dL — ABNORMAL HIGH (ref 70–99)
Glucose-Capillary: 167 mg/dL — ABNORMAL HIGH (ref 70–99)
Glucose-Capillary: 175 mg/dL — ABNORMAL HIGH (ref 70–99)
Glucose-Capillary: 274 mg/dL — ABNORMAL HIGH (ref 70–99)

## 2019-10-18 LAB — SARS CORONAVIRUS 2 (TAT 6-24 HRS): SARS Coronavirus 2: NEGATIVE

## 2019-10-18 MED ORDER — SIMETHICONE 80 MG PO CHEW: 80.0000 mg | CHEWABLE_TABLET | Freq: Four times a day (QID) | ORAL | 0 refills | Status: AC | PRN

## 2019-10-18 MED ORDER — ENSURE MAX PROTEIN PO LIQD: 11.0000 [oz_av] | Freq: Two times a day (BID) | ORAL | 0 refills | Status: AC

## 2019-10-18 MED ORDER — LOSARTAN POTASSIUM 50 MG PO TABS
50.0000 mg | ORAL_TABLET | Freq: Every day | ORAL | Status: DC
  Administered 2019-10-18: 50 mg via ORAL
  Filled 2019-10-18: qty 1

## 2019-10-18 MED ORDER — LOSARTAN POTASSIUM 50 MG PO TABS: 50.0000 mg | ORAL_TABLET | Freq: Every day | ORAL | 0 refills | Status: DC

## 2019-10-18 MED ORDER — SACCHAROMYCES BOULARDII 250 MG PO CAPS
250.0000 mg | ORAL_CAPSULE | Freq: Two times a day (BID) | ORAL | Status: DC
  Filled 2019-10-18 (×2): qty 1

## 2019-10-18 MED ORDER — FAMOTIDINE 20 MG PO TABS: 20.0000 mg | ORAL_TABLET | Freq: Every day | ORAL | 0 refills | Status: DC

## 2019-10-18 NOTE — TOC Progression Note (Signed)
Transition of Care [facility]) - Progression Note    Patient Details  Name: Vicki [name] MRN: [id number] Date of Birth: Dec 08, 1940  Transition of Care [facility] CM/SW [place], RN Phone Number: 10/18/2019, 10:24 AM  Clinical Narrative:    Met with the patient in the room, she will be discharging to her [place] at [address] [place] [address] She has a RW in the room to take home She wants to be set up with PT to gp home, I called Corene Cornea with [facility], he will let me know if they are able to accept the patient I Spoke with her sister Gay Filler and confirmed that the patient will DC to her home   Expected Discharge Plan: [place] Barriers to Discharge: Barriers Resolved  Expected Discharge Plan and Services Expected Discharge Plan: [place] arrangements for the past 2 months: Single Family Home Expected Discharge Date: 10/18/19                 DME Agency: [facility]       HH Arranged: PT [place] Agency: [place] ([place]) Date HH Agency Contacted: 10/18/19 Time [place]: [place] Representative spoke with at [place]: [place] ([place]) Interventions    Readmission Risk Interventions Readmission Risk Prevention Plan 10/15/2019  Transportation Screening Complete  PCP or Specialist Appt within 3-5 Days Complete  HRI or Home Care Consult Complete  Medication Review Press photographer) Complete  Some recent data might be hidden

## 2019-10-18 NOTE — TOC Progression Note (Signed)
Transition of Care [facility]) - Progression Note    Patient Details  Name: Amzie Sillas MRN: [id number] Date of Birth: 1940-09-22  Transition of Care [facility]) CM/SW Contact  Barrie Dunker, RN Phone Number: 10/18/2019, 10:49 AM  Clinical Narrative:    Barbara Cower with [facility] called and stated that they are unable to accept the patient, I called Jesiica with [facility] and inquired, she will call be with an answer   Expected Discharge Plan: Home w Home Health Services Barriers to Discharge: Barriers Resolved  Expected Discharge Plan and Services Expected Discharge Plan: Home w Home Health Services       Living arrangements for the past 2 months: Single Family Home Expected Discharge Date: 10/18/19                 DME Agency: [employer]       HH Arranged: PT HH Agency: [facility] Date HH Agency Contacted: 10/18/19 Time HH Agency Contacted: 1023 Representative spoke with at [facility] Agency: Barbara Cower   Social Determinants of Health (SDOH) Interventions    Readmission Risk Interventions Readmission Risk Prevention Plan 10/15/2019  Transportation Screening Complete  PCP or Specialist Appt within 3-5 Days Complete  HRI or Home Care Consult Complete  Medication Review Oceanographer) Complete  Some recent data might be hidden

## 2019-10-18 NOTE — TOC Transition Note (Signed)
Transition of Care [facility]) - CM/SW Discharge Note   Patient Details  Name: Vicki [name] MRN: [id number] Date of Birth: [birth year]  Transition of Care [facility]) CM/SW Contact:  Barrie Dunker, RN Phone Number: 10/18/2019, 11:49 AM   Clinical Narrative:    I spoke with the patient about [facility] PT compared to Outpatient PT, She stated that she does not need PT at all, She has the RW to take home and she stated that she can walk just fine, I gave her the resource booklet for other resources available.  She is ready to DC   Final next level of care: Home w Home Health Services Barriers to Discharge: Barriers Resolved   Patient Goals and CMS Choice Patient states their goals for this hospitalization and ongoing recovery are:: go to her sisters house      Discharge Placement                       Discharge Plan and Services                  DME Agency: [employer]       HH Arranged: PT HH Agency: [facility] Date HH Agency Contacted: 10/18/19 Time HH Agency Contacted: 1023 Representative spoke with at [facility] Agency: Barbara Cower  Social Determinants of Health (SDOH) Interventions     Readmission Risk Interventions Readmission Risk Prevention Plan 10/15/2019  Transportation Screening Complete  PCP or Specialist Appt within 3-5 Days Complete  HRI or Home Care Consult Complete  Medication Review Oceanographer) Complete  Some recent data might be hidden

## 2019-10-18 NOTE — Care Management Important Message (Signed)
Important Message  Patient Details  Name: Vicki [name] MRN: [id number] Date of Birth: 03-04-1941   Medicare Important Message Given:  Yes     [name] A Jhana Giarratano 10/18/2019, 10:59 AM

## 2019-10-18 NOTE — TOC Progression Note (Addendum)
Transition of Care [facility]) - Progression Note    Patient Details  Name: Brandelyn Henne MRN: [id number] Date of Birth: October 10, 1940  Transition of Care [facility]) CM/SW Contact  Barrie Dunker, RN Phone Number: 10/18/2019, 11:39 AM  Clinical Narrative:    [facility] called and stated that they could not take the patient due to staffing, Cassie with [facility] called and stated that they could not accept the patient due to [facility] HH would accept the patient however she would have to pay out of pocket and be reimbursed, will notify the patient   Expected Discharge Plan: Home w Home Health Services Barriers to Discharge: Barriers Resolved  Expected Discharge Plan and Services Expected Discharge Plan: Home w Home Health Services       Living arrangements for the past 2 months: Single Family Home Expected Discharge Date: 10/18/19                 DME Agency: [employer]       HH Arranged: PT HH Agency: [facility] Date HH Agency Contacted: 10/18/19 Time HH Agency Contacted: 1023 Representative spoke with at [facility] Agency: Barbara Cower   Social Determinants of Health (SDOH) Interventions    Readmission Risk Interventions Readmission Risk Prevention Plan 10/15/2019  Transportation Screening Complete  PCP or Specialist Appt within 3-5 Days Complete  HRI or Home Care Consult Complete  Medication Review Oceanographer) Complete  Some recent data might be hidden

## 2019-10-18 NOTE — Progress Notes (Signed)
   10/18/19 1010  Clinical Encounter Type  Visited With Patient  Visit Type Follow-up;Spiritual support  Referral From Chaplain  Consult/Referral To Chaplain  When Chaplain entered the room Ms. Mcelhannon was glad to see her. Patient explained that she is being discharged today and will be staying with her sister, [name] who works at [employer] on the second floor. Ms. Dao is glad that she doesn't have to go to a rehab. She also talked briefly about her deceased son. In the midst of the conversation, a nurse came in asking questions about where Ms. Fear wold be going after discharge. She was preparing to order whatever patient needed. Chaplain asked nurse outside the room if Ms. Broski could get assistance with getting her insulin. The nurse said the insurance Ms. Eanes has doesn't allow for discounts but [facility] has an insulin that is compatible with what Ms. Kitamura uses. We went back into the room and the nurse explained that patient could contact [employer] to find out the cost for their insulin. The nurse also told her that she may be able to get some of her other meds cheaper, but patient would have to inquire at pharmacy. Chaplain offered pastoral presence, empathy, and prayer. Chaplain told Ms. Boese that she would inquire about how she was doing through her sister, [name]. Ms. marilla boddy Chaplain for coming to see her and thye wished each other well.

## 2019-10-19 LAB — CULTURE, BLOOD (ROUTINE X 2)
Culture: NO GROWTH
Culture: NO GROWTH
Special Requests: ADEQUATE
Special Requests: ADEQUATE

## 2019-10-20 NOTE — Discharge Summary (Signed)
[facility] Discharge Summary   Patient: Vicki [name] MLY:[id number]  PCP: Lynnea Ferrier, MD  Date of admission: 10/14/2019   Date of discharge: 10/18/2019      Discharge Diagnoses:   Principal Problem:   DKA (diabetic ketoacidoses) (HCC) Active Problems:   Nausea and vomiting   Diffuse abdominal pain   GERD (gastroesophageal reflux disease)   Mild dementia (HCC)   Acute kidney injury (HCC)   Hypertension   Admitted From: home Disposition:  Home pt refused SNF  Recommendations for Outpatient Follow-up:  1. PCP: please follow up with PCP in 1 week 2. Follow up LABS/TEST:  none  Follow-up Information    Lynnea Ferrier, MD On 10/20/2019.   Specialty: Internal Medicine Why: Please schedule PCP appointment within 5-7 days of discharge.; (Appt w/ Orlie Dakin, PA-C)  @ 10:00 am Contact information: [address] [place] [address] [phone]        Raj Janus, MD On 10/25/2019.   Specialty: Endocrinology Why: @ 11:30 am Contact information: [address] [facility] [place] [address] [phone]          Diet recommendation: Carb modified diet  Activity: The patient is advised to gradually reintroduce usual activities, as tolerated  Discharge Condition: stable  Code Status: Full code   History of present illness: As per the H and P dictated on admission, "Vicki [name] is a 79 y.o. female with Past medical history of uncontrolled diabetes with recurrent DKA, GERD, HTN, mild dementia. Patient presented with complaints of poor p.o. intake as well as abdominal pain ongoing since her recent discharge.  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  Discharge home.  After reaching home patient continues to feel fatigue and tired.  Since Monday started having reports of nausea and vomiting.  She mentions that she is compliant with all her medication but does not know name of  her medication.  She tells me that she is fairly independent.  Her family does not help her with medications.  She denies any fever or chills denies any chest pain or chest tightness. "  Hospital Course:   Summary of her active problems in the hospital is as following. 1.Severe DKA. Type 2 diabetes mellitus, uncontrolled with hyperglycemia with renal complication Significantly high beta hydroxybutyric acid. Undetectable bicarb. Unable to calculate anion gap due to above. Lactic acid also elevated. Blood glucose of 700. Hyperkalemia  Treated with Endo tool and IV fluids.  Anion gap closed. Currently back on basal bolus regimen.  Dose adjusted to patient's hyperglycemia.  2.Severe hyperkalemia Now hypokalemia Corrected with IV fluids as well as IV calcium gluconate and IV bicarb. Replaced.  3.Nausea vomiting and diarrhea. The nausea and vomiting as well as abdominal pain likely associated with her DKA. CT abdomen negative for any acute abnormality. does not have any diarrhea.  C. difficile testing canceled.  Tolerating oral diet.  Reports gas.  4.Dementia. Generalized weakness. Patient maintained that she is fairly independent she does not remember her medication or insulin dose. With recurrent admission for DKA I suspect that patient may require more assistance.  Patient continues to report generalized weakness and fatigue as well. PT recommends SNF.   5.Hyperlipidemia. Continue statins.  6.Leukocytosis. Suspect this is hemoconcentration. Monitor. Rule out C. difficile.  Unable to collect clean sample so far.  7.AKI.  Resolved.  From prerenal Baseline serum creatinine 0.8. On admission serum creatinine 1.70. Improving  with IV hydration.   Patient was seen by physical therapy, who recommended SNF, pt efused and wanted to go home which was arranged. On the day of the discharge the patient's vitals were stable, and no other acute medical condition were  reported by patient. the patient was felt safe to be discharge at Home with Home health.  Consultants: none Procedures: none  Discharge Exam: General: Appear in no distress, no Rash; Oral Mucosa Clear, moist. Cardiovascular: S1 and S2 Present, no Murmur, Respiratory: normal respiratory effort, Bilateral Air entry present and no Crackles, no wheezes Abdomen: Bowel Sound present, Soft and no tenderness, no hernia Extremities: no Pedal edema, no calf tenderness Neurology: alert and oriented to time, place, and person affect appropriate.  Filed Weights   10/14/19 1312 10/14/19 1700  Weight: 64.4 kg 63.8 kg   Vitals:   10/18/19 0624 10/18/19 1345  BP: (!) 177/85 (!) 131/111  Pulse: 78 94  Resp:  15  Temp:  98.3 F (36.8 C)  SpO2:  100%    DISCHARGE MEDICATION: Allergies as of 10/18/2019   No Known Allergies     Medication List    STOP taking these medications   docusate sodium 100 MG capsule Commonly known as: COLACE   Insulin Glargine 300 UNIT/ML Sopn Commonly known as: Toujeo SoloStar   labetalol 100 MG tablet Commonly known as: NORMODYNE     TAKE these medications   acetaminophen 500 MG tablet Commonly known as: TYLENOL Take 1,000-1,500 mg by mouth every 6 (six) hours as needed for mild pain or headache.   aspirin 81 MG EC tablet Take 1 tablet (81 mg total) by mouth daily.   Centrum Adults Tabs Take 1 tablet by mouth every morning.   citalopram 20 MG tablet Commonly known as: CELEXA Take 20 mg by mouth daily.   Combigan 0.2-0.5 % ophthalmic solution Generic drug: brimonidine-timolol Place 1 drop into both eyes daily.   donepezil 5 MG tablet Commonly known as: ARICEPT Take 5 mg by mouth at bedtime.   Ensure Max Protein Liqd Take 330 mLs (11 oz total) by mouth 2 (two) times daily between meals.   famotidine 20 MG tablet Commonly known as: PEPCID Take 1 tablet (20 mg total) by mouth daily.   fluticasone 50 MCG/ACT nasal spray Commonly known as:  FLONASE Place 2 sprays into both nostrils daily.   hydrALAZINE 50 MG tablet Commonly known as: APRESOLINE Take 1 tablet (50 mg total) by mouth every 8 (eight) hours. What changed:   how much to take  when to take this   insulin lispro 100 UNIT/ML injection Commonly known as: HUMALOG Inject 0.04 mLs (4 Units total) into the skin 3 (three) times daily before meals. Pt uses per sliding scale.   isosorbide mononitrate 30 MG 24 hr tablet Commonly known as: IMDUR Take 1 tablet (30 mg total) by mouth daily.   loratadine 10 MG tablet Commonly known as: CLARITIN Take 10 mg by mouth daily.   losartan 50 MG tablet Commonly known as: COZAAR Take 1 tablet (50 mg total) by mouth daily.   lovastatin 40 MG tablet Commonly known as: MEVACOR Take 40 mg by mouth at bedtime.   Magnesium Oxide 420 MG Tabs Take 420 mg by mouth daily.   memantine 5 MG tablet Commonly known as: NAMENDA Take 5 mg by mouth every evening.   omeprazole 20 MG capsule Commonly known as: PriLOSEC Take 1 capsule (20 mg total) by mouth daily.   polyethylene glycol 17 g packet  Commonly known as: MIRALAX / GLYCOLAX Take 17 g by mouth daily.   saccharomyces boulardii 250 MG capsule Commonly known as: FLORASTOR Take 250 mg by mouth 2 (two) times daily.   simethicone 80 MG chewable tablet Commonly known as: MYLICON Chew 1 tablet (80 mg total) by mouth 4 (four) times daily as needed for flatulence.      No Known Allergies Discharge Instructions    Diet Carb Modified   Complete by: As directed    Discharge instructions   Complete by: As directed    It is important that you read the given instructions as well as go over your medication list with RN to help you understand your care after this hospitalization.  Please follow-up with PCP in 1-2 weeks.  Please note that NO REFILLS for any discharge medications will be authorized once you are discharged, as it is imperative that you return to your primary care  physician (or establish a relationship with a primary care physician if you do not have one) for your aftercare needs so that they can reassess your need for medications and monitor your lab values.  Please request your primary care physician to go over all Hospital Tests and Procedure/Radiological results at the follow up. Please get all Hospital records sent to your PCP by signing hospital release before you go home.   Do not drive, operating heavy machinery, perform activities at heights, swimming or participation in water activities or provide baby sitting services while you are on Pain, Sleep and Anxiety Medications; until you have been seen by Primary Care Physician or a Neurologist and are cleared to do such activities.  Do not take more than prescribed Pain, Sleep and Anxiety Medications.  You were cared for by a hospitalist during your hospital stay. If you have any questions about your discharge medications or the care you received while you were in the hospital after you are discharged, you can call the unit @ you were admitted to and ask to speak with the hospitalist Lynden Oxford. Ask for Hospitalist on call if the hospitalist that took care of you is not available.   Once you are discharged, your primary care physician will handle any further medical issues.  You Must read complete instructions/literature along with all the possible adverse reactions/side effects for all the Medicines you take and that have been prescribed to you. Take any new Medicines after you have completely understood and accept all the possible adverse reactions/side effects.  If you have smoked or chewed Tobacco in the last 2 yrs please STOP smoking STOP any Recreational drug use.  Wear Seat belts while driving.   Increase activity slowly   Complete by: As directed       The results of significant diagnostics from this hospitalization (including imaging, microbiology, ancillary and laboratory) are  listed below for reference.    Significant Diagnostic Studies: CT ABDOMEN PELVIS WO CONTRAST  Result Date: 10/14/2019 CLINICAL DATA:  Generalized weakness, nausea. Diverticulitis suspected. EXAM: CT ABDOMEN AND PELVIS WITHOUT CONTRAST TECHNIQUE: Multidetector CT imaging of the abdomen and pelvis was performed following the standard protocol without IV contrast. COMPARISON:  CT abdomen dated 02/06/2017. FINDINGS: Lower chest: No acute abnormality. Small hiatal hernia. Hepatobiliary: No focal liver abnormality is seen. Status post cholecystectomy. No biliary dilatation. Pancreas: Unremarkable. No pancreatic ductal dilatation or surrounding inflammatory changes. Spleen: Normal in size without focal abnormality. Adrenals/Urinary Tract: Adrenal glands appear normal. Kidneys are unremarkable without mass, stone or hydronephrosis. No ureteral or bladder  calculi are identified. Bladder is unremarkable. Stomach/Bowel: No dilated large or small bowel loops. No evidence of bowel wall inflammation. Vascular/Lymphatic: Aortic atherosclerosis. No enlarged lymph nodes seen. Reproductive: Presumed hysterectomy. No adnexal mass or free fluid. Other: No free fluid or abscess collection. No free intraperitoneal air. Musculoskeletal: No acute or suspicious osseous finding. Degenerative spondylosis of the lumbar spine head lower thoracic spine, mild to moderate in degree. IMPRESSION: 1. No acute findings within the abdomen or pelvis. No bowel obstruction or evidence of bowel wall inflammation. No evidence of acute solid organ abnormality. No renal or ureteral calculi. 2. Small hiatal hernia. Aortic Atherosclerosis (ICD10-I70.0). Electronically Signed   By: Bary Richard M.D.   On: 10/14/2019 17:12   CT ANGIO HEAD W OR WO CONTRAST  Result Date: 10/09/2019 CLINICAL DATA:  Vision loss, monocular. Additional provided: Altered mental status today. EXAM: CT ANGIOGRAPHY HEAD AND NECK TECHNIQUE: Multidetector CT imaging of the head and  neck was performed using the standard protocol during bolus administration of intravenous contrast. Multiplanar CT image reconstructions and MIPs were obtained to evaluate the vascular anatomy. Carotid stenosis measurements (when applicable) are obtained utilizing NASCET criteria, using the distal internal carotid diameter as the denominator. CONTRAST:  75mL OMNIPAQUE IOHEXOL 350 MG/ML SOLN COMPARISON:  Brain MRI 10/02/2018, head CT 03/12/2018. FINDINGS: CT HEAD FINDINGS Brain: There is no evidence of acute intracranial hemorrhage, intracranial mass, midline shift or extra-axial fluid collection.No demarcated cortical infarction. Similar appearance of ill-defined hypoattenuation within the cerebral white matter which is nonspecific, but consistent with chronic small vessel ischemic disease. Stable, moderate generalized parenchymal atrophy. Vascular: No hyperdense vessel.  Atherosclerotic calcifications. Skull: Normal. Negative for fracture or focal lesion. Sinuses: Minimal partial opacification of right ethmoid air cells. No significant mastoid effusion. Orbits: Visualized orbits demonstrate no acute abnormality. Review of the MIP images confirms the above findings CTA NECK FINDINGS Aortic arch: Standard aortic branching. Minimal calcified plaque within the visualized aortic arch and proximal major branch vessels of the neck. No significant innominate or proximal subclavian artery stenosis. Right carotid system: CCA and ICA patent within the neck without stenosis. Tortuosity of the mid to distal cervical ICA. Minimal mixed plaque within the carotid bifurcation. Left carotid system: CCA and ICA patent within the neck without stenosis. Tortuosity of the distal cervical ICA. Vertebral arteries: Codominant and patent within the neck without stenosis. Skeleton: No acute bony abnormality or aggressive osseous lesion. Cervical spondylosis with multilevel posterior disc osteophytes, uncovertebral and facet hypertrophy.  Other neck: No neck mass or cervical lymphadenopathy. Subcentimeter right thyroid lobe nodule not meeting consensus criteria for ultrasound follow-up. Upper chest: No consolidation within the imaged lung apices. Review of the MIP images confirms the above findings CTA HEAD FINDINGS Anterior circulation: The intracranial internal carotid arteries are patent bilaterally with calcified plaque. Moderate stenosis within the paraclinoid right ICA. Mild to moderate stenosis within the paraclinoid left ICA. Otherwise no more than mild stenosis within these vessels. The M1 middle cerebral arteries are patent without significant stenosis. No M2 proximal branch occlusion or high-grade proximal stenosis is identified. The anterior cerebral arteries are patent without high-grade proximal stenosis. No intracranial aneurysm is identified. Posterior circulation: The intracranial vertebral arteries are patent without significant stenosis. The basilar artery is patent without significant stenosis. The bilateral posterior cerebral arteries are patent without significant proximal stenosis. Small posterior communicating arteries are present bilaterally. Venous sinuses: Within limitations of contrast timing, no convincing thrombus. Anatomic variants: None significant. Other: 5 mm extra-axial enhancing focus along the right  frontal operculum which may reflect a tiny incidental meningioma (series 11, image 236). Review of the MIP images confirms the above findings IMPRESSION: CT head: 1. No evidence of acute intracranial abnormality 2. Stable generalized parenchymal atrophy and chronic small vessel ischemic disease. 3. Mild partial opacification of right ethmoid air cells. CTA neck: The bilateral common carotid, internal carotid and vertebral arteries are patent within the neck without significant stenosis. CTA head: 1. No intracranial large vessel occlusion. 2. Calcified plaque within the intracranial internal carotid arteries. Moderate  stenosis within the paraclinoid right ICA. Mild-to-moderate stenosis within the paraclinoid left ICA. 3. 5 mm extra-axial enhancing focus along the right frontal operculum which may reflect a tiny incidental meningioma. Electronically Signed   By: Kellie Simmering DO   On: 10/09/2019 21:07   DG Chest 1 View  Result Date: 10/06/2019 CLINICAL DATA:  Weakness. Hyperglycemia. EXAM: CHEST  1 VIEW COMPARISON:  12/21/2018 FINDINGS: Heart size appears normal. There is no pleural effusion or edema. No airspace opacifications. The visualized osseous structures are unremarkable. IMPRESSION: No acute cardiopulmonary abnormalities. Electronically Signed   By: Kerby Moors M.D.   On: 10/06/2019 10:14   CT ANGIO NECK W OR WO CONTRAST  Result Date: 10/09/2019 CLINICAL DATA:  Vision loss, monocular. Additional provided: Altered mental status today. EXAM: CT ANGIOGRAPHY HEAD AND NECK TECHNIQUE: Multidetector CT imaging of the head and neck was performed using the standard protocol during bolus administration of intravenous contrast. Multiplanar CT image reconstructions and MIPs were obtained to evaluate the vascular anatomy. Carotid stenosis measurements (when applicable) are obtained utilizing NASCET criteria, using the distal internal carotid diameter as the denominator. CONTRAST:  77mL OMNIPAQUE IOHEXOL 350 MG/ML SOLN COMPARISON:  Brain MRI 10/02/2018, head CT 03/12/2018. FINDINGS: CT HEAD FINDINGS Brain: There is no evidence of acute intracranial hemorrhage, intracranial mass, midline shift or extra-axial fluid collection.No demarcated cortical infarction. Similar appearance of ill-defined hypoattenuation within the cerebral white matter which is nonspecific, but consistent with chronic small vessel ischemic disease. Stable, moderate generalized parenchymal atrophy. Vascular: No hyperdense vessel.  Atherosclerotic calcifications. Skull: Normal. Negative for fracture or focal lesion. Sinuses: Minimal partial opacification of  right ethmoid air cells. No significant mastoid effusion. Orbits: Visualized orbits demonstrate no acute abnormality. Review of the MIP images confirms the above findings CTA NECK FINDINGS Aortic arch: Standard aortic branching. Minimal calcified plaque within the visualized aortic arch and proximal major branch vessels of the neck. No significant innominate or proximal subclavian artery stenosis. Right carotid system: CCA and ICA patent within the neck without stenosis. Tortuosity of the mid to distal cervical ICA. Minimal mixed plaque within the carotid bifurcation. Left carotid system: CCA and ICA patent within the neck without stenosis. Tortuosity of the distal cervical ICA. Vertebral arteries: Codominant and patent within the neck without stenosis. Skeleton: No acute bony abnormality or aggressive osseous lesion. Cervical spondylosis with multilevel posterior disc osteophytes, uncovertebral and facet hypertrophy. Other neck: No neck mass or cervical lymphadenopathy. Subcentimeter right thyroid lobe nodule not meeting consensus criteria for ultrasound follow-up. Upper chest: No consolidation within the imaged lung apices. Review of the MIP images confirms the above findings CTA HEAD FINDINGS Anterior circulation: The intracranial internal carotid arteries are patent bilaterally with calcified plaque. Moderate stenosis within the paraclinoid right ICA. Mild to moderate stenosis within the paraclinoid left ICA. Otherwise no more than mild stenosis within these vessels. The M1 middle cerebral arteries are patent without significant stenosis. No M2 proximal branch occlusion or high-grade proximal stenosis is identified.  The anterior cerebral arteries are patent without high-grade proximal stenosis. No intracranial aneurysm is identified. Posterior circulation: The intracranial vertebral arteries are patent without significant stenosis. The basilar artery is patent without significant stenosis. The bilateral  posterior cerebral arteries are patent without significant proximal stenosis. Small posterior communicating arteries are present bilaterally. Venous sinuses: Within limitations of contrast timing, no convincing thrombus. Anatomic variants: None significant. Other: 5 mm extra-axial enhancing focus along the right frontal operculum which may reflect a tiny incidental meningioma (series 11, image 236). Review of the MIP images confirms the above findings IMPRESSION: CT head: 1. No evidence of acute intracranial abnormality 2. Stable generalized parenchymal atrophy and chronic small vessel ischemic disease. 3. Mild partial opacification of right ethmoid air cells. CTA neck: The bilateral common carotid, internal carotid and vertebral arteries are patent within the neck without significant stenosis. CTA head: 1. No intracranial large vessel occlusion. 2. Calcified plaque within the intracranial internal carotid arteries. Moderate stenosis within the paraclinoid right ICA. Mild-to-moderate stenosis within the paraclinoid left ICA. 3. 5 mm extra-axial enhancing focus along the right frontal operculum which may reflect a tiny incidental meningioma. Electronically Signed   By: Jackey LogeKyle  Golden DO   On: 10/09/2019 21:07   DG Chest Portable 1 View  Result Date: 10/14/2019 CLINICAL DATA:  Cough EXAM: PORTABLE CHEST 1 VIEW COMPARISON:  10/06/2019 FINDINGS: The heart size and mediastinal contours are within normal limits. Both lungs are clear. No pleural effusion or pneumothorax. The visualized skeletal structures are unremarkable. IMPRESSION: No acute process in the chest. Electronically Signed   By: Guadlupe SpanishPraneil  Salli Bodin M.D.   On: 10/14/2019 14:58    Microbiology: Recent Results (from the past 240 hour(s))  Culture, blood (routine x 2)     Status: None   Collection Time: 10/14/19  1:59 PM   Specimen: BLOOD  Result Value Ref Range Status   Specimen Description BLOOD BLOOD LEFT HAND  Final   Special Requests   Final     BOTTLES DRAWN AEROBIC AND ANAEROBIC Blood Culture adequate volume   Culture   Final    NO GROWTH 5 DAYS Performed at [facility], [address] [place], [place] [address]    Report Status 10/19/2019 FINAL  Final  Culture, blood (routine x 2)     Status: None   Collection Time: 10/14/19  1:59 PM   Specimen: BLOOD  Result Value Ref Range Status   Specimen Description BLOOD LEFT ANTECUBITAL  Final   Special Requests   Final    BOTTLES DRAWN AEROBIC AND ANAEROBIC Blood Culture adequate volume   Culture   Final    NO GROWTH 5 DAYS Performed at [facility], [address] [place], [place] [address]    Report Status 10/19/2019 FINAL  Final  Respiratory Panel by RT PCR (Flu A&B, Covid) - Nasopharyngeal Swab     Status: None   Collection Time: 10/14/19  2:36 PM   Specimen: Nasopharyngeal Swab  Result Value Ref Range Status   SARS Coronavirus 2 by RT PCR NEGATIVE NEGATIVE Final    Comment: (NOTE) SARS-CoV-2 target nucleic acids are NOT DETECTED. The SARS-CoV-2 RNA is generally detectable in upper respiratoy specimens during the acute phase of infection. The lowest concentration of SARS-CoV-2 viral copies this assay can detect is 131 copies/mL. A negative result does not preclude SARS-Cov-2 infection and should not be used as the sole basis for treatment or other patient management decisions. A negative result may occur with  improper specimen  collection/handling, submission of specimen other than nasopharyngeal swab, presence of viral mutation(s) within the areas targeted by this assay, and inadequate number of viral copies (<131 copies/mL). A negative result must be combined with clinical observations, patient history, and epidemiological information. The expected result is Negative. Fact Sheet for Patients:  https://www.moore.com/ Fact Sheet for Healthcare Providers:  https://www.young.biz/ This test is not yet ap proved  or cleared by the [place] FDA and  has been authorized for detection and/or diagnosis of SARS-CoV-2 by FDA under an Emergency Use Authorization (EUA). This EUA will remain  in effect (meaning this test can be used) for the duration of the COVID-19 declaration under Section 564(b)(1) of the Act, 21 U.S.C. section 360bbb-3(b)(1), unless the authorization is terminated or revoked sooner.    Influenza A by PCR NEGATIVE NEGATIVE Final   Influenza B by PCR NEGATIVE NEGATIVE Final    Comment: (NOTE) The Xpert Xpress SARS-CoV-2/FLU/RSV assay is intended as an aid in  the diagnosis of influenza from Nasopharyngeal swab specimens and  should not be used as a sole basis for treatment. Nasal washings and  aspirates are unacceptable for Xpert Xpress SARS-CoV-2/FLU/RSV  testing. Fact Sheet for Patients: https://www.moore.com/ Fact Sheet for Healthcare Providers: https://www.young.biz/ This test is not yet approved or cleared by the [place] FDA and  has been authorized for detection and/or diagnosis of SARS-CoV-2 by  FDA under an Emergency Use Authorization (EUA). This EUA will remain  in effect (meaning this test can be used) for the duration of the  Covid-19 declaration under Section 564(b)(1) of the Act, 21  U.S.C. section 360bbb-3(b)(1), unless the authorization is  terminated or revoked. Performed at [facility], [address] [place], [place] [address]   MRSA PCR Screening     Status: Abnormal   Collection Time: 10/14/19  5:08 PM   Specimen: Nasal Mucosa; Nasopharyngeal  Result Value Ref Range Status   MRSA by PCR POSITIVE (A) NEGATIVE Final    Comment:        The GeneXpert MRSA Assay (FDA approved for NASAL specimens only), is one component of a comprehensive MRSA colonization surveillance program. It is not intended to diagnose MRSA infection nor to guide or monitor treatment for MRSA infections. RESULT CALLED TO,  READ BACK BY AND VERIFIED WITH: Patty Sermons 10/14/19 @ 1943  MLK Performed at [facility], [address] [place], [place] [address]   SARS CORONAVIRUS 2 (TAT 6-24 HRS) Nasopharyngeal Nasopharyngeal Swab     Status: None   Collection Time: 10/17/19 10:18 PM   Specimen: Nasopharyngeal Swab  Result Value Ref Range Status   SARS Coronavirus 2 NEGATIVE NEGATIVE Final    Comment: (NOTE) SARS-CoV-2 target nucleic acids are NOT DETECTED. The SARS-CoV-2 RNA is generally detectable in upper and lower respiratory specimens during the acute phase of infection. Negative results do not preclude SARS-CoV-2 infection, do not rule out co-infections with other pathogens, and should not be used as the sole basis for treatment or other patient management decisions. Negative results must be combined with clinical observations, patient history, and epidemiological information. The expected result is Negative. Fact Sheet for Patients: HairSlick.no Fact Sheet for Healthcare Providers: quierodirigir.com This test is not yet approved or cleared by the [place] FDA and  has been authorized for detection and/or diagnosis of SARS-CoV-2 by FDA under an Emergency Use Authorization (EUA). This EUA will remain  in effect (meaning this test can be used) for the duration of the COVID-19 declaration under Section  56 4(b)(1) of the Act, 21 U.S.C. section 360bbb-3(b)(1), unless the authorization is terminated or revoked sooner. Performed at [facility], [address] [place], [place] [address]      Labs: CBC: Recent Labs  Lab 10/14/19 1313 10/15/19 0324 10/18/19 0722  WBC 21.0* 20.5* 5.1  NEUTROABS  --  16.8* 2.1  HGB 12.3 9.8* 11.3*  HCT 39.3 29.7* 34.8*  MCV 98.0 91.7 93.3  PLT 288 238 223   Basic Metabolic Panel: Recent Labs  Lab 10/14/19 1313 10/14/19 1720 10/15/19 0324 10/15/19 0847 10/16/19 0437 10/17/19 0558  10/18/19 0722  NA 132*   < > 143 142 138 137 139  K 6.2*   < > 3.4* 3.4* 3.2* 3.9 3.9  CL 98   < > 115* 112* 105 104 102  CO2 <7*   < > 21* GLUCOSE 748*   < > 218* 148* 316* 242* 185*  BUN 42*   < > 33* 32* CREATININE 1.70*   < > 1.11* 1.05* 0.78 0.73 0.73  CALCIUM 10.1   < > 8.8* 8.7* 7.9* 8.4* 8.9  MG 2.5*  --  2.0 1.8  --   --   --   PHOS  --   --   --  1.7*  --   --   --    < > = values in this interval not displayed.   Liver Function Tests: Recent Labs  Lab 10/14/19 1313  AST 30  ALT 28  ALKPHOS 120  BILITOT 2.0*  PROT 7.9  ALBUMIN 4.1   No results for input(s): LIPASE, AMYLASE in the last 168 hours. No results for input(s): AMMONIA in the last 168 hours. Cardiac Enzymes: No results for input(s): CKTOTAL, CKMB, CKMBINDEX, TROPONINI in the last 168 hours. BNP (last 3 results) No results for input(s): BNP in the last 8760 hours. CBG: Recent Labs  Lab 10/17/19 2015 10/18/19 0013 10/18/19 0350 10/18/19 0757 10/18/19 1206  GLUCAP 236* 274* 175* 167* 159*    Time spent: 35 minutes  Signed:  Lynden Oxford  [facility] 10/18/2019 1:03 AM

## 2019-10-21 LAB — VITAMIN B1: Vitamin B1 (Thiamine): 141 nmol/L (ref 66.5–200.0)

## 2019-10-23 ENCOUNTER — Other Ambulatory Visit: Payer: Self-pay

## 2019-10-23 ENCOUNTER — Inpatient Hospital Stay
Admission: EM | Admit: 2019-10-23 | Discharge: 2019-10-27 | DRG: 637 | Disposition: A | Discharge status: Skilled Nursing Facility | Payer: Medicare Other | Attending: Internal Medicine | Admitting: Internal Medicine

## 2019-10-23 ENCOUNTER — Emergency Department: Payer: Medicare Other

## 2019-10-23 DIAGNOSIS — E871 Hypo-osmolality and hyponatremia: Secondary | ICD-10-CM | POA: Diagnosis present

## 2019-10-23 DIAGNOSIS — R571 Hypovolemic shock: Secondary | ICD-10-CM | POA: Diagnosis present

## 2019-10-23 DIAGNOSIS — Z90710 Acquired absence of both cervix and uterus: Secondary | ICD-10-CM

## 2019-10-23 DIAGNOSIS — Z79899 Other long term (current) drug therapy: Secondary | ICD-10-CM

## 2019-10-23 DIAGNOSIS — E785 Hyperlipidemia, unspecified: Secondary | ICD-10-CM | POA: Diagnosis present

## 2019-10-23 DIAGNOSIS — E1110 Type 2 diabetes mellitus with ketoacidosis without coma: Secondary | ICD-10-CM | POA: Diagnosis not present

## 2019-10-23 DIAGNOSIS — G9341 Metabolic encephalopathy: Secondary | ICD-10-CM | POA: Diagnosis present

## 2019-10-23 DIAGNOSIS — K219 Gastro-esophageal reflux disease without esophagitis: Secondary | ICD-10-CM | POA: Diagnosis present

## 2019-10-23 DIAGNOSIS — D72829 Elevated white blood cell count, unspecified: Secondary | ICD-10-CM | POA: Diagnosis present

## 2019-10-23 DIAGNOSIS — F0390 Unspecified dementia without behavioral disturbance: Secondary | ICD-10-CM | POA: Diagnosis present

## 2019-10-23 DIAGNOSIS — Z20822 Contact with and (suspected) exposure to covid-19: Secondary | ICD-10-CM | POA: Diagnosis present

## 2019-10-23 DIAGNOSIS — Z833 Family history of diabetes mellitus: Secondary | ICD-10-CM | POA: Diagnosis not present

## 2019-10-23 DIAGNOSIS — I252 Old myocardial infarction: Secondary | ICD-10-CM

## 2019-10-23 DIAGNOSIS — Z794 Long term (current) use of insulin: Secondary | ICD-10-CM | POA: Diagnosis not present

## 2019-10-23 DIAGNOSIS — F0280 Dementia in other diseases classified elsewhere without behavioral disturbance: Secondary | ICD-10-CM | POA: Diagnosis not present

## 2019-10-23 DIAGNOSIS — Z8673 Personal history of transient ischemic attack (TIA), and cerebral infarction without residual deficits: Secondary | ICD-10-CM | POA: Diagnosis not present

## 2019-10-23 DIAGNOSIS — Z7982 Long term (current) use of aspirin: Secondary | ICD-10-CM

## 2019-10-23 DIAGNOSIS — N179 Acute kidney failure, unspecified: Secondary | ICD-10-CM | POA: Diagnosis present

## 2019-10-23 DIAGNOSIS — I10 Essential (primary) hypertension: Secondary | ICD-10-CM | POA: Diagnosis not present

## 2019-10-23 DIAGNOSIS — E875 Hyperkalemia: Secondary | ICD-10-CM | POA: Diagnosis present

## 2019-10-23 DIAGNOSIS — G309 Alzheimer's disease, unspecified: Secondary | ICD-10-CM | POA: Diagnosis not present

## 2019-10-23 DIAGNOSIS — E1169 Type 2 diabetes mellitus with other specified complication: Secondary | ICD-10-CM | POA: Diagnosis not present

## 2019-10-23 LAB — PROCALCITONIN: Procalcitonin: 0.27 ng/mL

## 2019-10-23 LAB — CBC WITH DIFFERENTIAL/PLATELET
Abs Immature Granulocytes: 0.24 10*3/uL — ABNORMAL HIGH (ref 0.00–0.07)
Basophils Absolute: 0.1 10*3/uL (ref 0.0–0.1)
Basophils Relative: 0 %
Eosinophils Absolute: 0 10*3/uL (ref 0.0–0.5)
Eosinophils Relative: 0 %
HCT: 38.8 % (ref 36.0–46.0)
Hemoglobin: 11.6 g/dL — ABNORMAL LOW (ref 12.0–15.0)
Immature Granulocytes: 1 %
Lymphocytes Relative: 6 %
Lymphs Abs: 1.4 10*3/uL (ref 0.7–4.0)
MCH: 30.6 pg (ref 26.0–34.0)
MCHC: 29.9 g/dL — ABNORMAL LOW (ref 30.0–36.0)
MCV: 102.4 fL — ABNORMAL HIGH (ref 80.0–100.0)
Monocytes Absolute: 1.7 10*3/uL — ABNORMAL HIGH (ref 0.1–1.0)
Monocytes Relative: 8 %
Neutro Abs: 17.9 10*3/uL — ABNORMAL HIGH (ref 1.7–7.7)
Neutrophils Relative %: 85 %
Platelets: 343 10*3/uL (ref 150–400)
RBC: 3.79 MIL/uL — ABNORMAL LOW (ref 3.87–5.11)
RDW: 13.7 % (ref 11.5–15.5)
WBC: 21.2 10*3/uL — ABNORMAL HIGH (ref 4.0–10.5)
nRBC: 0 % (ref 0.0–0.2)

## 2019-10-23 LAB — PHOSPHORUS: Phosphorus: 6.3 mg/dL — ABNORMAL HIGH (ref 2.5–4.6)

## 2019-10-23 LAB — GLUCOSE, CAPILLARY
Glucose-Capillary: >600 mg/dL (ref 70–99)
Glucose-Capillary: >600 mg/dL (ref 70–99)
Glucose-Capillary: >600 mg/dL (ref 70–99)
Glucose-Capillary: 511 mg/dL (ref 70–99)
Glucose-Capillary: 541 mg/dL (ref 70–99)

## 2019-10-23 LAB — BASIC METABOLIC PANEL
BUN: 43 mg/dL — ABNORMAL HIGH (ref 8–23)
CO2: <7 mmol/L — ABNORMAL LOW (ref 22–32)
Calcium: 9.3 mg/dL (ref 8.9–10.3)
Chloride: 94 mmol/L — ABNORMAL LOW (ref 98–111)
Creatinine, Ser: 1.88 mg/dL — ABNORMAL HIGH (ref 0.44–1.00)
GFR calc Af Amer: 29 mL/min — ABNORMAL LOW (ref >60)
GFR calc non Af Amer: 25 mL/min — ABNORMAL LOW (ref >60)
Glucose, Bld: 847 mg/dL (ref 70–99)
Potassium: 6.2 mmol/L — ABNORMAL HIGH (ref 3.5–5.1)
Sodium: 134 mmol/L — ABNORMAL LOW (ref 135–145)

## 2019-10-23 LAB — RESPIRATORY PANEL BY RT PCR (FLU A&B, COVID)
Influenza A by PCR: NEGATIVE
Influenza B by PCR: NEGATIVE
SARS Coronavirus 2 by RT PCR: NEGATIVE

## 2019-10-23 LAB — CREATININE, SERUM
Creatinine, Ser: 1.87 mg/dL — ABNORMAL HIGH (ref 0.44–1.00)
GFR calc Af Amer: 29 mL/min — ABNORMAL LOW (ref >60)
GFR calc non Af Amer: 25 mL/min — ABNORMAL LOW (ref >60)

## 2019-10-23 LAB — MRSA PCR SCREENING: MRSA by PCR: NEGATIVE

## 2019-10-23 LAB — MAGNESIUM: Magnesium: 2.9 mg/dL — ABNORMAL HIGH (ref 1.7–2.4)

## 2019-10-23 LAB — LACTIC ACID, PLASMA: Lactic Acid, Venous: 4.7 mmol/L (ref 0.5–1.9)

## 2019-10-23 MED ORDER — DEXTROSE 50 % IV SOLN: 0.0000 mL | INTRAVENOUS | Status: DC | PRN

## 2019-10-23 MED ORDER — SODIUM BICARBONATE 8.4 % IV SOLN
INTRAVENOUS | Status: DC
  Filled 2019-10-23 (×2): qty 150

## 2019-10-23 MED ORDER — CITALOPRAM HYDROBROMIDE 20 MG PO TABS
20.0000 mg | ORAL_TABLET | Freq: Every day | ORAL | Status: DC
  Administered 2019-10-24 – 2019-10-27 (×3): 20 mg via ORAL
  Filled 2019-10-23 (×4): qty 1

## 2019-10-23 MED ORDER — DEXTROSE-NACL 5-0.45 % IV SOLN: INTRAVENOUS | Status: DC

## 2019-10-23 MED ORDER — INSULIN ASPART 100 UNIT/ML IV SOLN
10.0000 [IU] | Freq: Once | INTRAVENOUS | Status: DC
  Filled 2019-10-23: qty 0.1

## 2019-10-23 MED ORDER — SODIUM CHLORIDE 0.9 % IV SOLN
INTRAVENOUS | Status: DC
Start: 2019-10-23 — End: 2019-10-23

## 2019-10-23 MED ORDER — LORATADINE 10 MG PO TABS
10.0000 mg | ORAL_TABLET | Freq: Every day | ORAL | Status: DC
  Administered 2019-10-24 – 2019-10-27 (×4): 10 mg via ORAL
  Filled 2019-10-23 (×4): qty 1

## 2019-10-23 MED ORDER — STERILE WATER FOR INJECTION IV SOLN
INTRAVENOUS | Status: DC
  Filled 2019-10-23 (×5): qty 850

## 2019-10-23 MED ORDER — SODIUM BICARBONATE-DEXTROSE 150-5 MEQ/L-% IV SOLN: INTRAVENOUS | Status: DC

## 2019-10-23 MED ORDER — SODIUM CHLORIDE 0.9 % IV BOLUS
500.0000 mL | Freq: Once | INTRAVENOUS | Status: AC
  Administered 2019-10-23: 500 mL via INTRAVENOUS

## 2019-10-23 MED ORDER — ASPIRIN EC 81 MG PO TBEC
81.0000 mg | DELAYED_RELEASE_TABLET | Freq: Every day | ORAL | Status: DC
  Administered 2019-10-24 – 2019-10-27 (×4): 81 mg via ORAL
  Filled 2019-10-23 (×4): qty 1

## 2019-10-23 MED ORDER — SODIUM CHLORIDE 0.9 % IV BOLUS
1000.0000 mL | Freq: Once | INTRAVENOUS | Status: AC
  Administered 2019-10-23: 1000 mL via INTRAVENOUS

## 2019-10-23 MED ORDER — ADULT MULTIVITAMIN W/MINERALS CH
1.0000 | ORAL_TABLET | Freq: Every day | ORAL | Status: DC
  Administered 2019-10-24 – 2019-10-27 (×4): 1 via ORAL
  Filled 2019-10-23 (×4): qty 1

## 2019-10-23 MED ORDER — BRIMONIDINE TARTRATE-TIMOLOL 0.2-0.5 % OP SOLN
1.0000 [drp] | Freq: Every day | OPHTHALMIC | Status: DC
  Filled 2019-10-23 (×2): qty 5

## 2019-10-23 MED ORDER — VANCOMYCIN HCL IN DEXTROSE 1-5 GM/200ML-% IV SOLN
1000.0000 mg | Freq: Once | INTRAVENOUS | Status: AC
  Administered 2019-10-23: 1000 mg via INTRAVENOUS
  Filled 2019-10-23: qty 200

## 2019-10-23 MED ORDER — PIPERACILLIN-TAZOBACTAM 3.375 G IVPB 30 MIN
3.3750 g | Freq: Once | INTRAVENOUS | Status: DC
Start: 2019-10-23 — End: 2019-10-23

## 2019-10-23 MED ORDER — ACETAMINOPHEN 325 MG PO TABS: 650.0000 mg | ORAL_TABLET | ORAL | Status: DC | PRN

## 2019-10-23 MED ORDER — SODIUM BICARBONATE 8.4 % IV SOLN
50.0000 meq | Freq: Once | INTRAVENOUS | Status: AC
  Administered 2019-10-23: 50 meq via INTRAVENOUS
  Filled 2019-10-23: qty 50

## 2019-10-23 MED ORDER — SIMETHICONE 80 MG PO CHEW
80.0000 mg | CHEWABLE_TABLET | Freq: Four times a day (QID) | ORAL | Status: DC | PRN
  Filled 2019-10-23: qty 1

## 2019-10-23 MED ORDER — DONEPEZIL HCL 5 MG PO TABS
5.0000 mg | ORAL_TABLET | Freq: Every day | ORAL | Status: DC
  Administered 2019-10-24 – 2019-10-26 (×3): 5 mg via ORAL
  Filled 2019-10-23 (×4): qty 1

## 2019-10-23 MED ORDER — MEMANTINE HCL 5 MG PO TABS
5.0000 mg | ORAL_TABLET | Freq: Every evening | ORAL | Status: DC
  Administered 2019-10-24 – 2019-10-26 (×3): 5 mg via ORAL
  Filled 2019-10-23 (×4): qty 1

## 2019-10-23 MED ORDER — ISOSORBIDE MONONITRATE ER 30 MG PO TB24
30.0000 mg | ORAL_TABLET | Freq: Every day | ORAL | Status: DC
  Administered 2019-10-24 – 2019-10-27 (×4): 30 mg via ORAL
  Filled 2019-10-23 (×4): qty 1

## 2019-10-23 MED ORDER — ENOXAPARIN SODIUM 40 MG/0.4ML ~~LOC~~ SOLN
30.0000 mg | SUBCUTANEOUS | Status: DC
  Administered 2019-10-23 – 2019-10-24 (×2): 30 mg via SUBCUTANEOUS
  Filled 2019-10-23 (×2): qty 0.4

## 2019-10-23 MED ORDER — SODIUM BICARBONATE 8.4 % IV SOLN: 50.0000 meq | Freq: Once | INTRAVENOUS | Status: DC

## 2019-10-23 MED ORDER — INSULIN REGULAR(HUMAN) IN NACL 100-0.9 UT/100ML-% IV SOLN
INTRAVENOUS | Status: DC
  Administered 2019-10-23: 5.5 [IU]/h via INTRAVENOUS
  Filled 2019-10-23: qty 100

## 2019-10-23 MED ORDER — FAMOTIDINE IN NACL 20-0.9 MG/50ML-% IV SOLN
20.0000 mg | INTRAVENOUS | Status: DC
  Administered 2019-10-23 – 2019-10-25 (×3): 20 mg via INTRAVENOUS
  Filled 2019-10-23 (×3): qty 50

## 2019-10-23 MED ORDER — ONDANSETRON HCL 4 MG/2ML IJ SOLN
4.0000 mg | Freq: Four times a day (QID) | INTRAMUSCULAR | Status: DC | PRN
  Administered 2019-10-23: 4 mg via INTRAVENOUS
  Filled 2019-10-23: qty 2

## 2019-10-23 MED ORDER — SODIUM CHLORIDE 0.9 % IV SOLN
1.0000 g | Freq: Once | INTRAVENOUS | Status: AC
  Administered 2019-10-23: 19:00:00 1 g via INTRAVENOUS
  Filled 2019-10-23: qty 1

## 2019-10-23 MED ORDER — PRAVASTATIN SODIUM 20 MG PO TABS
40.0000 mg | ORAL_TABLET | Freq: Every day | ORAL | Status: DC
  Administered 2019-10-24 – 2019-10-26 (×3): 40 mg via ORAL
  Filled 2019-10-23 (×3): qty 2

## 2019-10-23 NOTE — ED Triage Notes (Signed)
Patient arrived via EMS emergency traffic from home. Patient baseline is AOx4 and ambulatory. Patient current status is AOx0, new onset AMS and currently unable to walk. Patient was recently discharged from ICU for DKA 2-5 days ago per EMS.

## 2019-10-23 NOTE — ED Provider Notes (Signed)
[facility] EMERGENCY DEPARTMENT Provider Note   CSN: [id number] Arrival date & time: 10/23/19  1755     History Chief Complaint  Patient presents with  . Hyperglycemia  . Altered Mental Status    Vicki [name] is a 79 y.o. female hx of DM with DKA from medication uncompliance, who presented with altered mental status, hyperglycemia.  Patient was noted to be altered per family.  EMS was called.  Patient recently discharged for DKA.  Patient unable to give any history.  On chart review, patient has history of DKA and is known to be noncompliant with her meds.  The history is provided by the EMS personnel.  Level V caveat- AMS      Past Medical History:  Diagnosis Date  . Diabetes mellitus without complication ([place])   . GERD (gastroesophageal reflux disease)   . Hypertension   . Lesion of sciatic nerve, right side   . MI (myocardial infarction) ([place])   . Stroke [facility])     Patient Active Problem List   Diagnosis Date Noted  . DKA (diabetic ketoacidoses) ([place]) 10/14/2019  . Diabetic ketoacidosis without coma associated with diabetes mellitus due to underlying condition ([place])   . Dehydration 10/06/2019  . Generalized weakness 10/06/2019  . Acute kidney injury ([place]) 10/06/2019  . Hypertension   . Hypoglycemia 12/10/2017  . Mild dementia ([place]) 08/14/2017  . 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 NSTEMI (non-ST elevated myocardial infarction) ([place]) 10/01/2016  . 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 Diabetes ([place]) 09/29/2016  . GERD (gastroesophageal reflux disease) 09/29/2016  . Nausea and vomiting 03/24/2016  . Diffuse abdominal pain 03/24/2016  . Hypokalemia 03/24/2016  . Chest pain 03/24/2016  . Elevated troponin I level 09/26/2015  . 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[place]) 09/25/2015    Past Surgical History:  Procedure Laterality Date  . ABDOMINAL HYSTERECTOMY    . BREAST BIOPSY Right    neg  . BREAST SURGERY     . LEFT HEART CATH AND CORONARY ANGIOGRAPHY N/A 10/01/2016   Procedure: Left Heart Cath and Coronary Angiography and possible PCI;  Surgeon: Yolonda Kida, MD;  Location: [facility] CV LAB;  Service: Cardiovascular;  Laterality: N/A;     OB History    Gravida  1   Para      Term      Preterm      AB      Living        SAB      TAB      Ectopic      Multiple      Live Births              Family History  Problem Relation Age of Onset  . Heart disease Mother   . Diabetes Mellitus II Father   . Hypertension Sister   . Diabetes Mellitus II Sister   . Breast cancer Sister 27  . Hypertension Brother   . Diabetes Mellitus II Brother     Social History   Tobacco Use  . Smoking status: Never Smoker  . Smokeless tobacco: Never Used  Substance Use Topics  . Alcohol use: No  . Drug use: No    Home Medications Prior to Admission medications   Medication Sig Start Date End Date Taking? Authorizing Provider  acetaminophen (TYLENOL) 500 MG tablet Take 1,000-1,500 mg by mouth every 6 (six) hours as needed for mild pain or headache.  [provider]  aspirin EC 81 MG EC tablet Take 1 tablet (81 mg total) by mouth daily. 03/26/16   Katharina Caper, MD  brimonidine-timolol (COMBIGAN) 0.2-0.5 % ophthalmic solution Place 1 drop into both eyes daily.    [provider]  citalopram (CELEXA) 20 MG tablet Take 20 mg by mouth daily.    [provider]  donepezil (ARICEPT) 5 MG tablet Take 5 mg by mouth at bedtime.    [provider]  Ensure Max Protein (ENSURE MAX PROTEIN) LIQD Take 330 mLs (11 oz total) by mouth 2 (two) times daily between meals. 10/18/19   Rolly Salter, MD  famotidine (PEPCID) 20 MG tablet Take 1 tablet (20 mg total) by mouth daily. 10/18/19   Rolly Salter, MD  fluticasone Aleda Grana) 50 MCG/ACT nasal spray Place 2 sprays into both nostrils daily.    [provider]  hydrALAZINE (APRESOLINE) 50 MG tablet  Take 1 tablet (50 mg total) by mouth every 8 (eight) hours. Patient taking differently: Take 25 mg by mouth 3 (three) times daily.  03/12/18   [name], MD  insulin lispro (HUMALOG) 100 UNIT/ML injection Inject 0.04 mLs (4 Units total) into the skin 3 (three) times daily before meals. Pt uses per sliding scale. 12/11/17   Houston Siren, MD  isosorbide mononitrate (IMDUR) 30 MG 24 hr tablet Take 1 tablet (30 mg total) by mouth daily. 03/26/16   Katharina Caper, MD  loratadine (CLARITIN) 10 MG tablet Take 10 mg by mouth daily.    [provider]  losartan (COZAAR) 50 MG tablet Take 1 tablet (50 mg total) by mouth daily. 10/18/19   Rolly Salter, MD  lovastatin (MEVACOR) 40 MG tablet Take 40 mg by mouth at bedtime.     [provider]  Magnesium Oxide 420 MG TABS Take 420 mg by mouth daily.     [provider]  memantine (NAMENDA) 5 MG tablet Take 5 mg by mouth every evening.    [provider]  Multiple Vitamins-Minerals (CENTRUM ADULTS) TABS Take 1 tablet by mouth every morning.     [provider]  omeprazole (PRILOSEC) 20 MG capsule Take 1 capsule (20 mg total) by mouth daily. 09/28/15   Katha Hamming, MD  polyethylene glycol (MIRALAX / GLYCOLAX) packet Take 17 g by mouth daily. 05/03/15   [name], MD  saccharomyces boulardii (FLORASTOR) 250 MG capsule Take 250 mg by mouth 2 (two) times daily.    [provider]  simethicone (MYLICON) 80 MG chewable tablet Chew 1 tablet (80 mg total) by mouth 4 (four) times daily as needed for flatulence. 10/18/19   Rolly Salter, MD    Allergies    Patient has no known allergies.  Review of Systems   Review of Systems  Neurological: Positive for weakness.  All other systems reviewed and are negative.   Physical Exam Updated Vital Signs Temp (S) 98.4 F (36.9 C) (Rectal)   Ht 5\' 4"  (1.626 m)   Wt 63.8 kg   BMI 24.14 kg/m   Physical Exam Vitals and nursing note  reviewed.  Constitutional:      Comments: Confused, altered   HENT:     Head: Normocephalic.     Nose: Nose normal.     Mouth/Throat:     Mouth: Mucous membranes are dry.  Eyes:     Extraocular Movements: Extraocular movements intact.     Pupils: Pupils are equal, round, and reactive to light.  Cardiovascular:     Rate and Rhythm: Regular rhythm. Tachycardia present.     Pulses: Normal pulses.  Pulmonary:     Effort: Pulmonary effort is normal.  Abdominal:     General: Abdomen is flat.  Musculoskeletal:        General: Normal range of motion.     Cervical back: Normal range of motion.  Skin:    General: Skin is warm.     Capillary Refill: Capillary refill takes more than 3 seconds.  Neurological:     Comments: Confused, not following commands   Psychiatric:     Comments: Unable      ED Results / Procedures / Treatments   Labs (all labs ordered are listed, but only abnormal results are displayed) Labs Reviewed  CBC WITH DIFFERENTIAL/PLATELET - Abnormal; Notable for the following components:      Result Value   WBC 21.2 (*)    RBC 3.79 (*)    Hemoglobin 11.6 (*)    MCV 102.4 (*)    MCHC 29.9 (*)    Neutro Abs 17.9 (*)    Monocytes Absolute 1.7 (*)    Abs Immature Granulocytes 0.24 (*)    All other components within normal limits  BLOOD GAS, VENOUS - Abnormal; Notable for the following components:   pH, Ven 7.18 (*)    pCO2, Ven <19.0 (*)    Bicarbonate 6.3 (*)    Acid-base deficit 19.8 (*)    All other components within normal limits  GLUCOSE, CAPILLARY - Abnormal; Notable for the following components:   Glucose-Capillary >600 (*)    All other components within normal limits  RESPIRATORY PANEL BY RT PCR (FLU A&B, COVID)  BASIC METABOLIC PANEL  BASIC METABOLIC PANEL  BASIC METABOLIC PANEL  BASIC METABOLIC PANEL  BETA-HYDROXYBUTYRIC ACID  URINALYSIS, ROUTINE W REFLEX MICROSCOPIC  CBG MONITORING, ED    EKG EKG Interpretation  Date/Time:  Saturday October 23 2019 18:10:33 EDT Ventricular Rate:  84 PR Interval:    QRS Duration: 118 QT Interval:  423 QTC Calculation: 501 R Axis:   -35 Text Interpretation: Sinus rhythm Ventricular premature complex LAD, consider left anterior fascicular block LVH with secondary repolarization abnormality Anterior Q waves, possibly due to LVH peaked T waves unchanged Confirmed by Richardean Canal [phone]) on 10/23/2019 6:31:53 PM   Radiology No results found.  Procedures Procedures (including critical care time)  CRITICAL CARE Performed by: Richardean Canal   Total critical care time: 40 minutes  Critical care time was exclusive of separately billable procedures and treating other patients.  Critical care was necessary to treat or prevent imminent or life-threatening deterioration.  Critical care was time spent personally by me on the following activities: development of treatment plan with patient and/or surrogate as well as nursing, discussions with consultants, evaluation of patient's response to treatment, examination of patient, obtaining history from patient or surrogate, ordering and performing treatments and interventions, ordering and review of laboratory studies, ordering and review of radiographic studies, pulse oximetry and re-evaluation of patient's condition.   Medications Ordered in ED Medications  sodium chloride 0.9 % bolus 1,000 mL (has no administration in time range)  insulin aspart (novoLOG) injection 10 Units (has no administration in time range)  sodium bicarbonate injection 50 mEq (has no administration in time range)  sodium bicarbonate injection 50 mEq (has no administration in time range)    ED Course  I have reviewed the triage vital signs and the nursing notes.  Pertinent labs &  imaging results that were available during my care of the patient were reviewed by me and considered in my medical decision making (see chart for details).    MDM Rules/Calculators/A&P                       Biviana Nieves Chapa is a 79 y.o. female here presenting with confusion and hyperglycemia.  Patient is on compliant with her meds.  Was just discharged from the hospital for DKA.  She has multiple admissions for DKA in the past.  Patient is altered and confused and CBG is greater than 600 on arrival.  Will get labs and VBG and initiate DKA protocol.  7 pm  pH is 7.18.  Bicarb is undetectably low.  Her potassium is 6.2.  She also has acute renal failure.  She also has a leukocytosis of 21.  I started her on glucose stabilizer for DKA. Patient also given bicarb as well.  Given poor mental status, pH of 7.18, ICU to admit for DKA.  I ordered broad-spectrum antibiotics as well.  Final Clinical Impression(s) / ED Diagnoses Final diagnoses:  None    Rx / DC Orders ED Discharge Orders    None       Charlynne Pander, MD 10/23/19 1958

## 2019-10-23 NOTE — H&P (Addendum)
Name: Vicki [name] MRN: [id number] DOB: 03-12-1941    ADMISSION DATE:  10/23/2019 CONSULTATION DATE: 10/23/2019  REFERRING MD : Dr. Darl Householder   CHIEF COMPLAINT: Altered mental status   BRIEF PATIENT DESCRIPTION: 79 yo female admitted with acute encephalopathy secondary to metabolic derangements in the setting of severe DKA requiring insulin gtt  SIGNIFICANT EVENTS/STUDIES:  03/20: Pt admitted to ICU with severe DKA on insulin gtt   HISTORY OF PRESENT ILLNESS:   This is an 79 yo female with a PMH of Stroke, MI, Lesion of Right Sciatic Nerve, HTN, GERD, Frequent hospitalizations due to DKA (due to medication noncompliance), and Uncontrolled Type I Diabetes Mellitus.  She was recently discharged from [facility] on 10/18/2019 following treatment of severe DKA.  She presented to [facility] ER via EMS on 03/20 with altered mental status and hyperglycemia. Upon arrival to the ER pt continued to have altered mental status with severe hyperglycemia.  Lab results ruled pt in for DKA, therefore insulin gtt initiated.  She also received 1.5L NS bolus, 1 amp of sodium bicarb, vancomycin, and cefepime. PCCM team contacted for ICU admission.    PAST MEDICAL HISTORY :   has a past medical history of Diabetes mellitus without complication ([place]), GERD (gastroesophageal reflux disease), Hypertension, Lesion of sciatic nerve, right side, MI (myocardial infarction) ([place]), and Stroke ([place]).  has a past surgical history that includes Abdominal hysterectomy; Breast surgery; LEFT HEART CATH AND CORONARY ANGIOGRAPHY (N/A, 10/01/2016); and Breast biopsy (Right). Prior to Admission medications   Medication Sig Start Date End Date Taking? Authorizing Provider  acetaminophen (TYLENOL) 500 MG tablet Take 1,000-1,500 mg by mouth every 6 (six) hours as needed for mild pain or headache.    [provider]  aspirin EC 81 MG EC tablet Take 1 tablet (81 mg total) by mouth daily. 03/26/16   Theodoro Grist, MD  brimonidine-timolol  (COMBIGAN) 0.2-0.5 % ophthalmic solution Place 1 drop into both eyes daily.    [provider]  citalopram (CELEXA) 20 MG tablet Take 20 mg by mouth daily.    [provider]  donepezil (ARICEPT) 5 MG tablet Take 5 mg by mouth at bedtime.    [provider]  Ensure Max Protein (ENSURE MAX PROTEIN) LIQD Take 330 mLs (11 oz total) by mouth 2 (two) times daily between meals. 10/18/19   Lavina Hamman, MD  famotidine (PEPCID) 20 MG tablet Take 1 tablet (20 mg total) by mouth daily. 10/18/19   Lavina Hamman, MD  fluticasone Asencion Islam) 50 MCG/ACT nasal spray Place 2 sprays into both nostrils daily.    [provider]  hydrALAZINE (APRESOLINE) 50 MG tablet Take 1 tablet (50 mg total) by mouth every 8 (eight) hours. Patient taking differently: Take 25 mg by mouth 3 (three) times daily.  03/12/18   Earleen Newport, MD  insulin lispro (HUMALOG) 100 UNIT/ML injection Inject 0.04 mLs (4 Units total) into the skin 3 (three) times daily before meals. Pt uses per sliding scale. 12/11/17   Henreitta Leber, MD  isosorbide mononitrate (IMDUR) 30 MG 24 hr tablet Take 1 tablet (30 mg total) by mouth daily. 03/26/16   Theodoro Grist, MD  loratadine (CLARITIN) 10 MG tablet Take 10 mg by mouth daily.    [provider]  losartan (COZAAR) 50 MG tablet Take 1 tablet (50 mg total) by mouth daily. 10/18/19   Lavina Hamman, MD  lovastatin (MEVACOR) 40 MG tablet Take 40 mg by mouth at bedtime.  [provider]  Magnesium Oxide 420 MG TABS Take 420 mg by mouth daily.     [provider]  memantine (NAMENDA) 5 MG tablet Take 5 mg by mouth every evening.    [provider]  Multiple Vitamins-Minerals (CENTRUM ADULTS) TABS Take 1 tablet by mouth every morning.     [provider]  omeprazole (PRILOSEC) 20 MG capsule Take 1 capsule (20 mg total) by mouth daily. 09/28/15   Katha Hamming, MD  polyethylene glycol (MIRALAX / GLYCOLAX) packet Take  17 g by mouth daily. 05/03/15   [name], MD  saccharomyces boulardii (FLORASTOR) 250 MG capsule Take 250 mg by mouth 2 (two) times daily.    [provider]  simethicone (MYLICON) 80 MG chewable tablet Chew 1 tablet (80 mg total) by mouth 4 (four) times daily as needed for flatulence. 10/18/19   Rolly Salter, MD   No Known Allergies  FAMILY HISTORY:  family history includes Breast cancer (age of onset: 3) in her sister; Diabetes Mellitus II in her brother, father, and sister; Heart disease in her mother; Hypertension in her brother and sister. SOCIAL HISTORY:  reports that she has never smoked. She has never used smokeless tobacco. She reports that she does not drink alcohol or use drugs.  REVIEW OF SYSTEMS:   Unable to assess pt confused and lethargic   SUBJECTIVE:  Unable to assess pt confused and lethargic   VITAL SIGNS: Temp:  [97.9 F (36.6 C)-98.4 F (36.9 C)] 97.9 F (36.6 C) (03/20 2045) Pulse Rate:  [83-96] 94 (03/20 2045) Resp:  [18-24] 18 (03/20 2045) BP: (128-153)/(42-82) 128/64 (03/20 2045) SpO2:  [100 %] 100 % (03/20 2045) Weight:  [63.8 kg-64.3 kg] 64.3 kg (03/20 2045)  PHYSICAL EXAMINATION: General: acutely ill appearing female, in mild respiratory distress  Neuro: lethargic, follows commands, PERRL  HEENT: supple, no JVD  Cardiovascular: nsr, rrr, no R/G Lungs: clear throughout, kussmaul respirations Abdomen: +BS x4, soft, obese, non distended, non tender  Musculoskeletal: normal bulk and tone, no edema  Skin: intact no rashes or lesions present   Recent Labs  Lab 10/17/19 0558 10/18/19 0722 10/23/19 1800  NA 137 139 134*  K 3.9 3.9 6.2*  CL 104 102 94*  CO2 26 29 <7*  BUN 13 12 43*  CREATININE 0.73 0.73 1.88*  GLUCOSE 242* 185* 847*   Recent Labs  Lab 10/18/19 0722 10/23/19 1800  HGB 11.3* 11.6*  HCT 34.8* 38.8  WBC 5.1 21.2*  PLT 223 343   DG Chest Portable 1 View  Result Date: 10/23/2019 CLINICAL DATA:  Altered  mental status EXAM: PORTABLE CHEST 1 VIEW COMPARISON:  10/14/2019 FINDINGS: Rotated. No new consolidation or edema. No pleural effusion or pneumothorax. Stable cardiomediastinal contours. IMPRESSION: No acute process in the chest. Electronically Signed   By: Guadlupe Spanish M.D.   On: 10/23/2019 18:57    ASSESSMENT / PLAN:  Diabetic Ketoacidosis  Continue insulin gtt until anion gap closed and serum CO2 >20 BMP's q4hrs, beta-hydroxybutyric acid q8hrs, and CBG's per endotool recommendations while on insulin gtt  Aggressive fluid resuscitation  Diabetes coordinator consulted appreciate input  Once mentation improves will need reinforcement education regarding medication compliance   Hypertension-stable   Hx: MI Continuous telemetry monitoring  Will continue outpatient imdur, and hold other outpatient antihypertensives for now   Acute renal failure with severe metabolic acidosis and hyperkalemia in the setting of DKA  Mild hyponatremia  Trend BMP  Replace electrolytes as indicated  Monitor UOP Avoid nephrotoxic medications  Will start sodium bicarb gtt @75  ml/hr   Leukocytosis although no obvious signs of infection  Trend WBC and monitor fever curve  Will check PCT, if elevated will start empiric abx  Trend lactic acid  UA pending  Follow cultures   GERD  Continue IV pepcid  Acute metabolic encephalopathy secondary to DKA  Frequent reorientation  Avoid sedating medication  If mentation does not improve following resolution of DKA will order CT Head   Best Practice: VTE px: subq lovenox  SUP px: iv pepcid  Diet: keep NPO until DKA resolved   , AGNP  Pulmonary/Critical Care Pager [phone] (please enter 7 digits) PCCM Consult Pager [phone] (please enter 7 digits)   [phone], AGNP  Pulmonary/Critical Care Pager [phone] (please enter 7 digits) PCCM Consult Pager [phone] (please enter 7 digits)

## 2019-10-23 NOTE — Progress Notes (Signed)
eLink Physician-Brief Progress Note Patient Name: Vicki [name] DOB: Jul 31, 1941 MRN: [id number]   Date of Service  10/23/2019  HPI/Events of Note  Pt admitted with DKA, AKI and hyperkalemia, she's on DKA protocol with Insulin gtt.  eICU Interventions  New Patient Evaluation completed.        Thomasene Lot Rissa Turley 10/23/2019, 10:18 PM

## 2019-10-24 LAB — BETA-HYDROXYBUTYRIC ACID
Beta-Hydroxybutyric Acid: >8 mmol/L — ABNORMAL HIGH (ref 0.05–0.27)
Beta-Hydroxybutyric Acid: 4.8 mmol/L — ABNORMAL HIGH (ref 0.05–0.27)

## 2019-10-24 LAB — BASIC METABOLIC PANEL
Anion gap: 19 — ABNORMAL HIGH (ref 5–15)
Anion gap: 13 (ref 5–15)
Anion gap: 14 (ref 5–15)
BUN: 42 mg/dL — ABNORMAL HIGH (ref 8–23)
BUN: 37 mg/dL — ABNORMAL HIGH (ref 8–23)
BUN: 39 mg/dL — ABNORMAL HIGH (ref 8–23)
BUN: 37 mg/dL — ABNORMAL HIGH (ref 8–23)
CO2: <7 mmol/L — ABNORMAL LOW (ref 22–32)
CO2: 15 mmol/L — ABNORMAL LOW (ref 22–32)
CO2: 23 mmol/L (ref 22–32)
CO2: 24 mmol/L (ref 22–32)
Calcium: 8.5 mg/dL — ABNORMAL LOW (ref 8.9–10.3)
Calcium: 8.4 mg/dL — ABNORMAL LOW (ref 8.9–10.3)
Calcium: 8.4 mg/dL — ABNORMAL LOW (ref 8.9–10.3)
Calcium: 8.3 mg/dL — ABNORMAL LOW (ref 8.9–10.3)
Chloride: 106 mmol/L (ref 98–111)
Chloride: 111 mmol/L (ref 98–111)
Chloride: 108 mmol/L (ref 98–111)
Chloride: 108 mmol/L (ref 98–111)
Creatinine, Ser: 1.67 mg/dL — ABNORMAL HIGH (ref 0.44–1.00)
Creatinine, Ser: 1.47 mg/dL — ABNORMAL HIGH (ref 0.44–1.00)
Creatinine, Ser: 1.31 mg/dL — ABNORMAL HIGH (ref 0.44–1.00)
Creatinine, Ser: 1.27 mg/dL — ABNORMAL HIGH (ref 0.44–1.00)
GFR calc Af Amer: 33 mL/min — ABNORMAL LOW (ref >60)
GFR calc Af Amer: 39 mL/min — ABNORMAL LOW (ref >60)
GFR calc Af Amer: 44 mL/min — ABNORMAL LOW (ref >60)
GFR calc Af Amer: 46 mL/min — ABNORMAL LOW (ref >60)
GFR calc non Af Amer: 29 mL/min — ABNORMAL LOW (ref >60)
GFR calc non Af Amer: 33 mL/min — ABNORMAL LOW (ref >60)
GFR calc non Af Amer: 38 mL/min — ABNORMAL LOW (ref >60)
GFR calc non Af Amer: 40 mL/min — ABNORMAL LOW (ref >60)
Glucose, Bld: 539 mg/dL (ref 70–99)
Glucose, Bld: 278 mg/dL — ABNORMAL HIGH (ref 70–99)
Glucose, Bld: 181 mg/dL — ABNORMAL HIGH (ref 70–99)
Glucose, Bld: 188 mg/dL — ABNORMAL HIGH (ref 70–99)
Potassium: 4.4 mmol/L (ref 3.5–5.1)
Potassium: 3.9 mmol/L (ref 3.5–5.1)
Potassium: 4 mmol/L (ref 3.5–5.1)
Potassium: 3.9 mmol/L (ref 3.5–5.1)
Sodium: 143 mmol/L (ref 135–145)
Sodium: 145 mmol/L (ref 135–145)
Sodium: 144 mmol/L (ref 135–145)
Sodium: 146 mmol/L — ABNORMAL HIGH (ref 135–145)

## 2019-10-24 LAB — GLUCOSE, CAPILLARY
Glucose-Capillary: 123 mg/dL — ABNORMAL HIGH (ref 70–99)
Glucose-Capillary: 139 mg/dL — ABNORMAL HIGH (ref 70–99)
Glucose-Capillary: 142 mg/dL — ABNORMAL HIGH (ref 70–99)
Glucose-Capillary: 153 mg/dL — ABNORMAL HIGH (ref 70–99)
Glucose-Capillary: 154 mg/dL — ABNORMAL HIGH (ref 70–99)
Glucose-Capillary: 156 mg/dL — ABNORMAL HIGH (ref 70–99)
Glucose-Capillary: 157 mg/dL — ABNORMAL HIGH (ref 70–99)
Glucose-Capillary: 164 mg/dL — ABNORMAL HIGH (ref 70–99)
Glucose-Capillary: 168 mg/dL — ABNORMAL HIGH (ref 70–99)
Glucose-Capillary: 170 mg/dL — ABNORMAL HIGH (ref 70–99)
Glucose-Capillary: 171 mg/dL — ABNORMAL HIGH (ref 70–99)
Glucose-Capillary: 171 mg/dL — ABNORMAL HIGH (ref 70–99)
Glucose-Capillary: 178 mg/dL — ABNORMAL HIGH (ref 70–99)
Glucose-Capillary: 179 mg/dL — ABNORMAL HIGH (ref 70–99)
Glucose-Capillary: 206 mg/dL — ABNORMAL HIGH (ref 70–99)
Glucose-Capillary: 247 mg/dL — ABNORMAL HIGH (ref 70–99)
Glucose-Capillary: 300 mg/dL — ABNORMAL HIGH (ref 70–99)
Glucose-Capillary: 365 mg/dL — ABNORMAL HIGH (ref 70–99)
Glucose-Capillary: 401 mg/dL — ABNORMAL HIGH (ref 70–99)

## 2019-10-24 LAB — URINALYSIS, ROUTINE W REFLEX MICROSCOPIC
Bilirubin Urine: NEGATIVE
Glucose, UA: >=500 mg/dL — AB
Hgb urine dipstick: NEGATIVE
Ketones, ur: 80 mg/dL — AB
Leukocytes,Ua: NEGATIVE
Nitrite: NEGATIVE
Protein, ur: NEGATIVE mg/dL
Specific Gravity, Urine: 1.018 (ref 1.005–1.030)
Squamous Epithelial / HPF: NONE SEEN (ref 0–5)
pH: 5 (ref 5.0–8.0)

## 2019-10-24 MED ORDER — INSULIN GLARGINE 100 UNIT/ML ~~LOC~~ SOLN
30.0000 [IU] | Freq: Every day | SUBCUTANEOUS | Status: DC
  Administered 2019-10-24 – 2019-10-26 (×3): 30 [IU] via SUBCUTANEOUS
  Filled 2019-10-24 (×5): qty 0.3

## 2019-10-24 MED ORDER — CHLORHEXIDINE GLUCONATE CLOTH 2 % EX PADS
6.0000 | MEDICATED_PAD | Freq: Every day | CUTANEOUS | Status: DC
  Administered 2019-10-24: 22:00:00 6 via TOPICAL

## 2019-10-24 MED ORDER — INSULIN ASPART 100 UNIT/ML ~~LOC~~ SOLN
0.0000 [IU] | Freq: Three times a day (TID) | SUBCUTANEOUS | Status: DC
  Administered 2019-10-25 (×2): 1 [IU] via SUBCUTANEOUS
  Administered 2019-10-25 – 2019-10-26 (×3): 2 [IU] via SUBCUTANEOUS
  Administered 2019-10-26 – 2019-10-27 (×2): 3 [IU] via SUBCUTANEOUS
  Filled 2019-10-24 (×7): qty 1

## 2019-10-24 MED ORDER — LOSARTAN POTASSIUM 50 MG PO TABS
50.0000 mg | ORAL_TABLET | Freq: Every day | ORAL | Status: DC
  Administered 2019-10-24 – 2019-10-27 (×3): 50 mg via ORAL
  Filled 2019-10-24 (×4): qty 1

## 2019-10-24 MED ORDER — HYDRALAZINE HCL 25 MG PO TABS
25.0000 mg | ORAL_TABLET | Freq: Three times a day (TID) | ORAL | Status: DC
  Administered 2019-10-24 – 2019-10-27 (×8): 25 mg via ORAL
  Filled 2019-10-24 (×8): qty 1

## 2019-10-24 MED ORDER — INSULIN ASPART 100 UNIT/ML ~~LOC~~ SOLN
0.0000 [IU] | Freq: Every day | SUBCUTANEOUS | Status: DC
  Administered 2019-10-25: 3 [IU] via SUBCUTANEOUS
  Filled 2019-10-24: qty 1

## 2019-10-24 MED ORDER — TIMOLOL MALEATE 0.5 % OP SOLN
1.0000 [drp] | Freq: Every day | OPHTHALMIC | Status: DC
  Administered 2019-10-25 – 2019-10-27 (×3): 1 [drp] via OPHTHALMIC
  Filled 2019-10-24: qty 5

## 2019-10-24 MED ORDER — BRIMONIDINE TARTRATE 0.2 % OP SOLN
1.0000 [drp] | Freq: Every day | OPHTHALMIC | Status: DC
  Administered 2019-10-25 – 2019-10-27 (×3): 1 [drp] via OPHTHALMIC
  Filled 2019-10-24: qty 5

## 2019-10-24 MED ORDER — DEXTROSE-NACL 5-0.45 % IV SOLN: INTRAVENOUS | Status: DC

## 2019-10-24 MED ORDER — POTASSIUM CHLORIDE CRYS ER 20 MEQ PO TBCR
20.0000 meq | EXTENDED_RELEASE_TABLET | Freq: Once | ORAL | Status: AC
  Administered 2019-10-24: 20 meq via ORAL
  Filled 2019-10-24: qty 1

## 2019-10-24 NOTE — Progress Notes (Signed)
PHARMACY CONSULT NOTE  Pharmacy Consult for Electrolyte Monitoring and Replacement   Recent Labs: Potassium (mmol/L)  Date Value  10/24/2019 3.9  04/09/2012 4.0   Magnesium (mg/dL)  Date Value  59/97/7414 2.9 (H)  04/05/2012 2.4   Calcium (mg/dL)  Date Value  23/95/3202 8.3 (L)   Calcium, Total (mg/dL)  Date Value  33/43/5686 8.8   Albumin (g/dL)  Date Value  16/83/7290 4.1  02/17/2012 3.2 (L)   Phosphorus (mg/dL)  Date Value  21/06/5519 6.3 (H)   Sodium (mmol/L)  Date Value  10/24/2019 146 (H)  04/09/2012 142     Assessment: 79 year old female admitted with DKA started on insulin drip. Also on sodium bicarb drip at this time. Pharmacy consulted to manage electrolytes.  Goal of Therapy:  Electrolytes WNL, K > 4 while on insulin and sodium bicarb drips  Plan:  Will give potassium 20 mEq PO x 1 now to maintain potassium. Will recheck BMP at 2000. Will consider more frequent monitoring depending on potassium.  Pricilla Riffle ,PharmD Clinical Pharmacist 10/24/2019 2:56 PM

## 2019-10-24 NOTE — Progress Notes (Signed)
Name: Vicki [name] MRN: [id number] DOB: September 01, 1940    ADMISSION DATE:  10/23/2019 CONSULTATION DATE: 10/23/2019  REFERRING MD : Dr. [name]   CHIEF COMPLAINT: Altered mental status   BRIEF PATIENT DESCRIPTION: 79 yo female admitted with acute encephalopathy secondary to metabolic derangements in the setting of severe DKA requiring insulin gtt  SIGNIFICANT EVENTS/STUDIES:  03/20: Pt admitted to ICU with severe DKA on insulin gtt  3/21- patient clinically improved, she is able to respond appropriately , blood glucose has imporved. Signing out to hospitalist team today for pick in in am  HISTORY OF PRESENT ILLNESS:   This is an 79 yo female with a PMH of Stroke, MI, Lesion of Right Sciatic Nerve, HTN, GERD, Frequent hospitalizations due to DKA (due to medication noncompliance), and Uncontrolled Type I Diabetes Mellitus.  She was recently discharged from [facility] on 10/18/2019 following treatment of severe DKA.  She presented to [facility] ER via EMS on 03/20 with altered mental status and hyperglycemia. Upon arrival to the ER pt continued to have altered mental status with severe hyperglycemia.  Lab results ruled pt in for DKA, therefore insulin gtt initiated.  She also received 1.5L NS bolus, 1 amp of sodium bicarb, vancomycin, and cefepime. PCCM team contacted for ICU admission.    PAST MEDICAL HISTORY :   has a past medical history of Diabetes mellitus without complication (HCC), GERD (gastroesophageal reflux disease), Hypertension, Lesion of sciatic nerve, right side, MI (myocardial infarction) (HCC), and Stroke (HCC).  has a past surgical history that includes Abdominal hysterectomy; Breast surgery; LEFT HEART CATH AND CORONARY ANGIOGRAPHY (N/A, 10/01/2016); and Breast biopsy (Right). Prior to Admission medications   Medication Sig Start Date End Date Taking? Authorizing Provider  acetaminophen (TYLENOL) 500 MG tablet Take 1,000-1,500 mg by mouth every 6 (six) hours as needed for mild pain or  headache.    [provider]  aspirin EC 81 MG EC tablet Take 1 tablet (81 mg total) by mouth daily. 03/26/16   Katharina Caper, MD  brimonidine-timolol (COMBIGAN) 0.2-0.5 % ophthalmic solution Place 1 drop into both eyes daily.    [provider]  citalopram (CELEXA) 20 MG tablet Take 20 mg by mouth daily.    [provider]  donepezil (ARICEPT) 5 MG tablet Take 5 mg by mouth at bedtime.    [provider]  Ensure Max Protein (ENSURE MAX PROTEIN) LIQD Take 330 mLs (11 oz total) by mouth 2 (two) times daily between meals. 10/18/19   Rolly Salter, MD  famotidine (PEPCID) 20 MG tablet Take 1 tablet (20 mg total) by mouth daily. 10/18/19   Rolly Salter, MD  fluticasone Aleda Grana) 50 MCG/ACT nasal spray Place 2 sprays into both nostrils daily.    [provider]  hydrALAZINE (APRESOLINE) 50 MG tablet Take 1 tablet (50 mg total) by mouth every 8 (eight) hours. Patient taking differently: Take 25 mg by mouth 3 (three) times daily.  03/12/18   [name], MD  insulin lispro (HUMALOG) 100 UNIT/ML injection Inject 0.04 mLs (4 Units total) into the skin 3 (three) times daily before meals. Pt uses per sliding scale. 12/11/17   Houston Siren, MD  isosorbide mononitrate (IMDUR) 30 MG 24 hr tablet Take 1 tablet (30 mg total) by mouth daily. 03/26/16   Katharina Caper, MD  loratadine (CLARITIN) 10 MG tablet Take 10 mg by mouth daily.    [provider]  losartan (COZAAR) 50 MG tablet Take 1 tablet (50 mg total)  by mouth daily. 10/18/19   Lavina Hamman, MD  lovastatin (MEVACOR) 40 MG tablet Take 40 mg by mouth at bedtime.     [provider]  Magnesium Oxide 420 MG TABS Take 420 mg by mouth daily.     [provider]  memantine (NAMENDA) 5 MG tablet Take 5 mg by mouth every evening.    [provider]  Multiple Vitamins-Minerals (CENTRUM ADULTS) TABS Take 1 tablet by mouth every morning.     [provider]   omeprazole (PRILOSEC) 20 MG capsule Take 1 capsule (20 mg total) by mouth daily. 09/28/15   Epifanio Lesches, MD  polyethylene glycol (MIRALAX / GLYCOLAX) packet Take 17 g by mouth daily. 05/03/15   Earleen Newport, MD  saccharomyces boulardii (FLORASTOR) 250 MG capsule Take 250 mg by mouth 2 (two) times daily.    [provider]  simethicone (MYLICON) 80 MG chewable tablet Chew 1 tablet (80 mg total) by mouth 4 (four) times daily as needed for flatulence. 10/18/19   Lavina Hamman, MD   No Known Allergies  FAMILY HISTORY:  family history includes Breast cancer (age of onset: 35) in her sister; Diabetes Mellitus II in her brother, father, and sister; Heart disease in her mother; Hypertension in her brother and sister. SOCIAL HISTORY:  reports that she has never smoked. She has never used smokeless tobacco. She reports that she does not drink alcohol or use drugs.  REVIEW OF SYSTEMS:   Unable to assess pt confused and lethargic   SUBJECTIVE:  Unable to assess pt confused and lethargic   VITAL SIGNS: Temp:  [97.8 F (36.6 C)-99.5 F (37.5 C)] 99.5 F (37.5 C) (03/21 1200) Pulse Rate:  [80-99] 88 (03/21 1200) Resp:  [9-27] 17 (03/21 1200) BP: (117-182)/(42-82) 182/80 (03/21 1200) SpO2:  [96 %-100 %] 96 % (03/21 1200) Weight:  [63.8 kg-64.3 kg] 64.3 kg (03/21 0443)  PHYSICAL EXAMINATION: General: acutely ill appearing female, in mild respiratory distress  Neuro: lethargic, follows commands, PERRL  HEENT: supple, no JVD  Cardiovascular: nsr, rrr, no R/G Lungs: clear throughout, kussmaul respirations Abdomen: +BS x4, soft, obese, non distended, non tender  Musculoskeletal: normal bulk and tone, no edema  Skin: intact no rashes or lesions present   Recent Labs  Lab 10/24/19 0218 10/24/19 0620 10/24/19 1011  NA 145 144 146*  K 3.9 4.0 3.9  CL 111 108 108  CO2 15* 23 24  BUN 37* 39* 37*  CREATININE 1.47* 1.31* 1.27*  GLUCOSE 278* 181* 188*   Recent Labs   Lab 10/18/19 0722 10/23/19 1800  HGB 11.3* 11.6*  HCT 34.8* 38.8  WBC 5.1 21.2*  PLT 223 343   DG Chest Portable 1 View  Result Date: 10/23/2019 CLINICAL DATA:  Altered mental status EXAM: PORTABLE CHEST 1 VIEW COMPARISON:  10/14/2019 FINDINGS: Rotated. No new consolidation or edema. No pleural effusion or pneumothorax. Stable cardiomediastinal contours. IMPRESSION: No acute process in the chest. Electronically Signed   By: Macy Mis M.D.   On: 10/23/2019 18:57    ASSESSMENT / PLAN:  Diabetic Ketoacidosis  Continue insulin gtt until anion gap closed and serum CO2 >20 BMP's q4hrs, beta-hydroxybutyric acid q8hrs, and CBG's per endotool recommendations while on insulin gtt  Aggressive fluid resuscitation  Diabetes coordinator consulted appreciate input  Once mentation improves will need reinforcement education regarding medication compliance   Hypertension-stable   Hx: MI Continuous telemetry monitoring  Will continue outpatient imdur, and hold other outpatient antihypertensives for now  Acute renal failure with severe metabolic acidosis and hyperkalemia in the setting of DKA  Mild hyponatremia  Trend BMP  Replace electrolytes as indicated  Monitor UOP Avoid nephrotoxic medications  Will start sodium bicarb gtt @75  ml/hr   Leukocytosis although no obvious signs of infection  Trend WBC and monitor fever curve  Will check PCT, if elevated will start empiric abx  Trend lactic acid  UA pending  Follow cultures   GERD  Continue IV pepcid  Acute metabolic encephalopathy secondary to DKA  Frequent reorientation  Avoid sedating medication  If mentation does not improve following resolution of DKA will order CT Head   Best Practice: VTE px: subq lovenox  SUP px: iv pepcid  Diet: keep NPO until DKA resolved   Critical care provider statement:    Critical care time (minutes):  33   Critical care time was exclusive of:  Separately billable procedures and  treating  other patients   Critical care was necessary to treat or prevent imminent or  life-threatening deterioration of the following conditions:  DKA, altered mental status, severe dehydration, AKI, hypovolemic, multiple comorbid conditions.    Critical care was time spent personally by me on the following  activities:  Development of treatment plan with patient or surrogate,  discussions with consultants, evaluation of patient's response to  treatment, examination of patient, obtaining history from patient or  surrogate, ordering and performing treatments and interventions, ordering  and review of laboratory studies and re-evaluation of patient's condition   I assumed direction of critical care for this patient from another  provider in my specialty: no     , M.D.  Pulmonary & Critical Care Medicine  [facility]

## 2019-10-24 NOTE — Progress Notes (Signed)
PHARMACY CONSULT NOTE  Pharmacy Consult for Electrolyte Monitoring and Replacement   Recent Labs: Potassium (mmol/L)  Date Value  10/24/2019 3.9  04/09/2012 4.0   Magnesium (mg/dL)  Date Value  06/03/1313 2.9 (H)  04/05/2012 2.4   Calcium (mg/dL)  Date Value  38/88/7579 8.3 (L)   Calcium, Total (mg/dL)  Date Value  72/82/0601 8.8   Albumin (g/dL)  Date Value  56/15/3794 4.1  02/17/2012 3.2 (L)   Phosphorus (mg/dL)  Date Value  32/76/1470 6.3 (H)   Sodium (mmol/L)  Date Value  10/24/2019 146 (H)  04/09/2012 142     Assessment: 79 year old female admitted with DKA started on insulin drip. Also on sodium bicarb drip at this time. Pharmacy consulted to manage electrolytes.  Goal of Therapy:  Electrolytes WNL, K > 4 while on insulin and sodium bicarb drips  Plan:  Will give potassium 20 mEq PO x 1 now to maintain potassium. Will recheck BMP at 2000. Will consider more frequent monitoring depending on potassium.  Update @ 2112: Per overnight NP, BMP no longer needed at this time as patient is no longer in DKA.   Katha Cabal ,PharmD Clinical Pharmacist 10/24/2019 9:12 PM

## 2019-10-24 NOTE — Progress Notes (Signed)
Vicki [name] is an 79 year old woman admitted to ICU for DKA and acute kidney injury.  She is improving and she will be downgraded to stepdown status effective tomorrow.  The hospitalist team has been consulted to take over the patient's care starting tomorrow.  Case was discussed with intensivist, Dr. Karna Christmas.

## 2019-10-24 NOTE — Progress Notes (Signed)
CRITICAL VALUE ALERT  Critical Value:  Lactic acid=4.7  Date & Time Notied:  10/23/19 2238  Provider Notified:Dana Salem Caster  Orders Received/Actions taken: yes

## 2019-10-25 ENCOUNTER — Other Ambulatory Visit: Payer: Self-pay

## 2019-10-25 DIAGNOSIS — E1110 Type 2 diabetes mellitus with ketoacidosis without coma: Principal | ICD-10-CM

## 2019-10-25 LAB — CBC WITH DIFFERENTIAL/PLATELET
Abs Immature Granulocytes: 0.09 10*3/uL — ABNORMAL HIGH (ref 0.00–0.07)
Basophils Absolute: 0 10*3/uL (ref 0.0–0.1)
Basophils Relative: 0 %
Eosinophils Absolute: 0.1 10*3/uL (ref 0.0–0.5)
Eosinophils Relative: 0 %
HCT: 30.9 % — ABNORMAL LOW (ref 36.0–46.0)
Hemoglobin: 10.1 g/dL — ABNORMAL LOW (ref 12.0–15.0)
Immature Granulocytes: 0 %
Lymphocytes Relative: 15 %
Lymphs Abs: 3.3 10*3/uL (ref 0.7–4.0)
MCH: 30.8 pg (ref 26.0–34.0)
MCHC: 32.7 g/dL (ref 30.0–36.0)
MCV: 94.2 fL (ref 80.0–100.0)
Monocytes Absolute: 1.5 10*3/uL — ABNORMAL HIGH (ref 0.1–1.0)
Monocytes Relative: 7 %
Neutro Abs: 16.9 10*3/uL — ABNORMAL HIGH (ref 1.7–7.7)
Neutrophils Relative %: 78 %
Platelets: 280 10*3/uL (ref 150–400)
RBC: 3.28 MIL/uL — ABNORMAL LOW (ref 3.87–5.11)
RDW: 13.9 % (ref 11.5–15.5)
WBC: 21.9 10*3/uL — ABNORMAL HIGH (ref 4.0–10.5)
nRBC: 0 % (ref 0.0–0.2)

## 2019-10-25 LAB — GLUCOSE, CAPILLARY
Glucose-Capillary: >600 mg/dL (ref 70–99)
Glucose-Capillary: 129 mg/dL — ABNORMAL HIGH (ref 70–99)
Glucose-Capillary: 126 mg/dL — ABNORMAL HIGH (ref 70–99)
Glucose-Capillary: 198 mg/dL — ABNORMAL HIGH (ref 70–99)
Glucose-Capillary: 294 mg/dL — ABNORMAL HIGH (ref 70–99)

## 2019-10-25 LAB — BASIC METABOLIC PANEL
Anion gap: 9 (ref 5–15)
BUN: 28 mg/dL — ABNORMAL HIGH (ref 8–23)
CO2: 32 mmol/L (ref 22–32)
Calcium: 7.7 mg/dL — ABNORMAL LOW (ref 8.9–10.3)
Chloride: 103 mmol/L (ref 98–111)
Creatinine, Ser: 0.97 mg/dL (ref 0.44–1.00)
GFR calc Af Amer: >60 mL/min (ref >60)
GFR calc non Af Amer: 55 mL/min — ABNORMAL LOW (ref >60)
Glucose, Bld: 130 mg/dL — ABNORMAL HIGH (ref 70–99)
Potassium: 3.5 mmol/L (ref 3.5–5.1)
Sodium: 144 mmol/L (ref 135–145)

## 2019-10-25 LAB — SEDIMENTATION RATE: Sed Rate: 26 mm/hr (ref 0–30)

## 2019-10-25 LAB — PROCALCITONIN
Procalcitonin: 0.37 ng/mL
Procalcitonin: 0.2 ng/mL

## 2019-10-25 LAB — PHOSPHORUS: Phosphorus: 1.7 mg/dL — ABNORMAL LOW (ref 2.5–4.6)

## 2019-10-25 LAB — URINE CULTURE

## 2019-10-25 LAB — MAGNESIUM: Magnesium: 2.1 mg/dL (ref 1.7–2.4)

## 2019-10-25 MED ORDER — POTASSIUM PHOSPHATES 15 MMOLE/5ML IV SOLN
30.0000 mmol | Freq: Once | INTRAVENOUS | Status: AC
  Administered 2019-10-25: 30 mmol via INTRAVENOUS
  Filled 2019-10-25: qty 10

## 2019-10-25 MED ORDER — ENOXAPARIN SODIUM 40 MG/0.4ML ~~LOC~~ SOLN
40.0000 mg | SUBCUTANEOUS | Status: DC
  Administered 2019-10-25 – 2019-10-26 (×2): 40 mg via SUBCUTANEOUS
  Filled 2019-10-25 (×2): qty 0.4

## 2019-10-25 MED ORDER — POTASSIUM CHLORIDE CRYS ER 20 MEQ PO TBCR
20.0000 meq | EXTENDED_RELEASE_TABLET | ORAL | Status: DC
  Administered 2019-10-25: 07:00:00 20 meq via ORAL
  Filled 2019-10-25 (×2): qty 1

## 2019-10-25 MED ORDER — PANTOPRAZOLE SODIUM 40 MG IV SOLR
40.0000 mg | INTRAVENOUS | Status: DC
  Administered 2019-10-25: 40 mg via INTRAVENOUS
  Filled 2019-10-25: qty 40

## 2019-10-25 NOTE — Progress Notes (Signed)
PHARMACIST - PHYSICIAN COMMUNICATION  CONCERNING:  Enoxaparin (Lovenox) for DVT Prophylaxis    RECOMMENDATION: Patient was prescribed enoxaprin 30 mg q24 hours for VTE prophylaxis.   Filed Weights   10/23/19 2045 10/24/19 0443 10/25/19 0500  Weight: 141 lb 12.1 oz (64.3 kg) 141 lb 12.1 oz (64.3 kg) 145 lb 11.6 oz (66.1 kg)    Body mass index is 25.01 kg/m.  Estimated Creatinine Clearance: 43.3 mL/min (by C-G formula based on SCr of 0.97 mg/dL).   Based on [facility] policy patient is candidate for enoxaparin 40 mg every 24 hours based on CrCl > 57ml/min  DESCRIPTION: Pharmacy has adjusted enoxaparin dose per [facility] policy.  Patient is now receiving enoxaparin 40 mg every 24 hours.  Pricilla Riffle, PharmD Clinical Pharmacist  10/25/2019 9:38 AM

## 2019-10-25 NOTE — Progress Notes (Addendum)
PHARMACY CONSULT NOTE  Pharmacy Consult for Electrolyte Monitoring and Replacement   Recent Labs: Potassium (mmol/L)  Date Value  10/25/2019 3.5  04/09/2012 4.0   Magnesium (mg/dL)  Date Value  20/80/2233 2.1  04/05/2012 2.4   Calcium (mg/dL)  Date Value  61/22/4497 7.7 (L)   Calcium, Total (mg/dL)  Date Value  53/00/5110 8.8   Albumin (g/dL)  Date Value  21/06/7355 4.1  02/17/2012 3.2 (L)   Phosphorus (mg/dL)  Date Value  70/14/1030 1.7 (L)   Sodium (mmol/L)  Date Value  10/25/2019 144  04/09/2012 142     Assessment: 79 year old female admitted with DKA started on insulin drip. Also on sodium bicarb drip at this time. Pharmacy consulted to manage electrolytes.  Insulin drip >> SQ insulin Bicarb drip d/c Scr 1.88 >> 0.97  Today patient with potassium at lower range of normal and mild hypophosphatemia  Goal of Therapy:  Electrolytes WNL  Plan:  -Potassium phosphate 30 mmol x 1 over 6 hours (already ordered) -Will defer further ordering of electrolytes and labs to primary provider. Pharmacy will continue to follow peripherally.   Tressie Ellis  Pharmacy Resident 10/25/2019 1:13 PM

## 2019-10-25 NOTE — Progress Notes (Signed)
CH visited pt. while rounding on ICU and as follow-up from previous visits when pt. was admitted last month.  Pt. alert and oriented, lying in bed; pt. glad to see [facility] and requested prayer.  CH offered prayer for pt.'s recovery and ability to return home.  When prayer finished, Pt. mentioned, 'sometimes God needs to get your attention and that's what's going on now... I think I'd forgotten about Him but Hr's reminding me who's in control.  CH tried to gently push back against the idea of her illness being punishment but pt. seemed fixated on this.  Pt. in relatively good spirits overall, prays frequently and would welcome additional chaplain visits for prayer.    10/25/19 1200  Clinical Encounter Type  Visited With Patient  Visit Type Follow-up;Psychological support;Spiritual support;Social support;Critical Care  Referral From Other (Comment) (Routine Rounding)  Consult/Referral To Chaplain  Spiritual Encounters  Spiritual Needs Prayer;Emotional  Stress Factors  Patient Stress Factors Health changes;Exhausted;Loss of control;Major life changes

## 2019-10-25 NOTE — Progress Notes (Signed)
PROGRESS NOTE    Vicki [name]  TMA:[id number] DOB: [birth year] DOA: 10/23/2019 PCP: Lynnea Ferrier, MD   Brief Narrative:  This is an 79 yo female with a PMH of Stroke, MI, Lesion of Right Sciatic Nerve, HTN, GERD, Frequent hospitalizations due to DKA (due to medication noncompliance), and Uncontrolled Type I Diabetes Mellitus.  She was recently discharged from [facility] on 10/18/2019 following treatment of severe DKA.  She presented to [facility] ER via EMS on 03/20 with altered mental status and hyperglycemia. Upon arrival to the ER pt continued to have altered mental status with severe hyperglycemia.  Lab results ruled pt in for DKA, therefore insulin gtt initiated.  She also received 1.5L NS bolus, 1 amp of sodium bicarb, vancomycin, and cefepime. PCCM team contacted for ICU admission.    Pt was recently discharged for severe DKA on 10/18/19 due to noncomplaince, at that time pt refused SNF.  Assessment & Plan:   Active Problems:   DKA (diabetic ketoacidoses) (HCC) Resolved.  Pt to cont her lantus units nightly/ ssi/accuchecks .  Hypertension: Patient continued on hydralazine, losartan 50 daily.  GERD: PO PPI therapy.  Leucocytosis: We will obtain cx / pcl.   DVT prophylaxis: Lovenox Code Status: FULL Family Communication: None at bedside.  Disposition Plan: Inpatient.  Subjective: Pt transferred to hospitalist team now she is stable and out of DKA. Pt is alert,awake and tolerating diet. Pt states she cannot go home and needs placement.  Labs show low phos and low potassium- replaced. ROS is negative.  Objective: Vitals:   10/25/19 1000 10/25/19 1100 10/25/19 1200 10/25/19 1300  BP: (!) 163/80 110/65 (!) 155/71 (!) 146/82  Pulse: 76 73 72 76  Resp: 18 10 13 14   Temp:      TempSrc:      SpO2: 99% 97% 96% 93%  Weight:      Height:        Intake/Output Summary (Last 24 hours) at 10/25/2019 1414 Last data filed at 10/25/2019 1300 Gross per 24 hour  Intake 1290.33 ml   Output 225 ml  Net 1065.33 ml   Filed Weights   10/23/19 2045 10/24/19 0443 10/25/19 0500  Weight: 64.3 kg 64.3 kg 66.1 kg    Examination: General exam: Appears calm and comfortable  Respiratory system: Clear to auscultation. Respiratory effort normal. Cardiovascular system: S1 & S2 heard, RRR. No JVD, murmurs, rubs, gallops or clicks. No pedal edema. Gastrointestinal system: Abdomen is nondistended, soft and nontender. No organomegaly or masses felt. Normal bowel sounds heard. Central nervous system: Alert and oriented. No focal neurological deficits. Extremities: Symmetric 5 x 5 power. Skin: No rashes, lesions or ulcers Psychiatry: Judgement and insight appear normal. Mood & affect appropriate.   Data Reviewed:  CBC: Recent Labs  Lab 10/23/19 1800 10/25/19 0236  WBC 21.2* 21.9*  NEUTROABS 17.9* 16.9*  HGB 11.6* 10.1*  HCT 38.8 30.9*  MCV 102.4* 94.2  PLT 343 280   Basic Metabolic Panel: Recent Labs  Lab 10/23/19 1800 10/23/19 2004 10/23/19 2243 10/24/19 0218 10/24/19 0620 10/24/19 1011 10/25/19 0401  NA   < >  --  143 145 144 146* 144  K   < >  --  4.4 3.9 4.0 3.9 3.5  CL   < >  --  106 111 108 108 103  CO2   < >  --  <7* 15* 23 24 32  GLUCOSE   < >  --  539* 278* 181* 188* 130*  BUN   < >  --  42* 37* 39* 37* 28*  CREATININE   < > 1.87* 1.67* 1.47* 1.31* 1.27* 0.97  CALCIUM   < >  --  8.5* 8.4* 8.4* 8.3* 7.7*  MG  --  2.9*  --   --   --   --  2.1  PHOS  --  6.3*  --   --   --   --  1.7*   < > = values in this interval not displayed.   GFR: Estimated Creatinine Clearance: 43.3 mL/min (by C-G formula based on SCr of 0.97 mg/dL).  CBG: Recent Labs  Lab 10/24/19 1911 10/24/19 2113 10/24/19 2318 10/25/19 0737 10/25/19 1119  GLUCAP 154* 153* 123* 129* 126*   Lipid Profile: No results for input(s): CHOL, HDL, LDLCALC, TRIG, CHOLHDL, LDLDIRECT in the last 72 hours. Thyroid Function Tests: No results for input(s): TSH, T4TOTAL, FREET4, T3FREE,  THYROIDAB in the last 72 hours. Anemia Panel: No results for input(s): VITAMINB12, FOLATE, FERRITIN, TIBC, IRON, RETICCTPCT in the last 72 hours. Sepsis Labs: Recent Labs  Lab 10/23/19 1820 10/23/19 2243 10/25/19 0236  PROCALCITON  --  0.27 0.37  LATICACIDVEN 4.7*  --   --     Recent Results (from the past 240 hour(s))  SARS CORONAVIRUS 2 (TAT 6-24 HRS) Nasopharyngeal Nasopharyngeal Swab     Status: None   Collection Time: 10/17/19 10:18 PM   Specimen: Nasopharyngeal Swab  Result Value Ref Range Status   SARS Coronavirus 2 NEGATIVE NEGATIVE Final    Comment: (NOTE) SARS-CoV-2 target nucleic acids are NOT DETECTED. The SARS-CoV-2 RNA is generally detectable in upper and lower respiratory specimens during the acute phase of infection. Negative results do not preclude SARS-CoV-2 infection, do not rule out co-infections with other pathogens, and should not be used as the sole basis for treatment or other patient management decisions. Negative results must be combined with clinical observations, patient history, and epidemiological information. The expected result is Negative. Fact Sheet for Patients: SugarRoll.be Fact Sheet for Healthcare Providers: https://www.woods-mathews.com/ This test is not yet approved or cleared by the [place] FDA and  has been authorized for detection and/or diagnosis of SARS-CoV-2 by FDA under an Emergency Use Authorization (EUA). This EUA will remain  in effect (meaning this test can be used) for the duration of the COVID-19 declaration under Section 56 4(b)(1) of the Act, 21 U.S.C. section 360bbb-3(b)(1), unless the authorization is terminated or revoked sooner. Performed at [facility], [place] [address], [place], [place] [address]   Blood culture (routine x 2)     Status: None (Preliminary result)   Collection Time: 10/23/19  6:00 PM   Specimen: BLOOD  Result Value Ref Range Status   Specimen  Description   Final    BLOOD Blood Culture results may not be optimal due to an inadequate volume of blood received in culture bottles   Special Requests   Final    BOTTLES DRAWN AEROBIC AND ANAEROBIC RIGHT ANTECUBITAL   Culture   Final    NO GROWTH 2 DAYS Performed at [facility], [address], [place], [place] [address]    Report Status PENDING  Incomplete  Respiratory Panel by RT PCR (Flu A&B, Covid) - Nasopharyngeal Swab     Status: None   Collection Time: 10/23/19  7:34 PM   Specimen: Nasopharyngeal Swab  Result Value Ref Range Status   SARS Coronavirus 2 by RT PCR NEGATIVE NEGATIVE Final    Comment: (NOTE) SARS-CoV-2 target nucleic acids are NOT  DETECTED. The SARS-CoV-2 RNA is generally detectable in upper respiratoy specimens during the acute phase of infection. The lowest concentration of SARS-CoV-2 viral copies this assay can detect is 131 copies/mL. A negative result does not preclude SARS-Cov-2 infection and should not be used as the sole basis for treatment or other patient management decisions. A negative result may occur with  improper specimen collection/handling, submission of specimen other than nasopharyngeal swab, presence of viral mutation(s) within the areas targeted by this assay, and inadequate number of viral copies (<131 copies/mL). A negative result must be combined with clinical observations, patient history, and epidemiological information. The expected result is Negative. Fact Sheet for Patients:  https://www.moore.com/ Fact Sheet for Healthcare Providers:  https://www.young.biz/ This test is not yet ap proved or cleared by the [place] FDA and  has been authorized for detection and/or diagnosis of SARS-CoV-2 by FDA under an Emergency Use Authorization (EUA). This EUA will remain  in effect (meaning this test can be used) for the duration of the COVID-19 declaration under Section 564(b)(1) of the  Act, 21 U.S.C. section 360bbb-3(b)(1), unless the authorization is terminated or revoked sooner.    Influenza A by PCR NEGATIVE NEGATIVE Final   Influenza B by PCR NEGATIVE NEGATIVE Final    Comment: (NOTE) The Xpert Xpress SARS-CoV-2/FLU/RSV assay is intended as an aid in  the diagnosis of influenza from Nasopharyngeal swab specimens and  should not be used as a sole basis for treatment. Nasal washings and  aspirates are unacceptable for Xpert Xpress SARS-CoV-2/FLU/RSV  testing. Fact Sheet for Patients: https://www.moore.com/ Fact Sheet for Healthcare Providers: https://www.young.biz/ This test is not yet approved or cleared by the [place] FDA and  has been authorized for detection and/or diagnosis of SARS-CoV-2 by  FDA under an Emergency Use Authorization (EUA). This EUA will remain  in effect (meaning this test can be used) for the duration of the  Covid-19 declaration under Section 564(b)(1) of the Act, 21  U.S.C. section 360bbb-3(b)(1), unless the authorization is  terminated or revoked. Performed at [facility], [address] [place], [place] [address]   Blood culture (routine x 2)     Status: None (Preliminary result)   Collection Time: 10/23/19  7:34 PM   Specimen: BLOOD  Result Value Ref Range Status   Specimen Description BLOOD BLOOD RIGHT HAND  Final   Special Requests   Final    BOTTLES DRAWN AEROBIC AND ANAEROBIC Blood Culture adequate volume   Culture   Final    NO GROWTH 2 DAYS Performed at [facility], [address] [place], [place] [address]    Report Status PENDING  Incomplete  MRSA PCR Screening     Status: None   Collection Time: 10/23/19 10:00 PM   Specimen: Nasal Mucosa; Nasopharyngeal  Result Value Ref Range Status   MRSA by PCR NEGATIVE NEGATIVE Final    Comment:        The GeneXpert MRSA Assay (FDA approved for NASAL specimens only), is one component of a comprehensive MRSA  colonization surveillance program. It is not intended to diagnose MRSA infection nor to guide or monitor treatment for MRSA infections. Performed at [facility], [address], [place], [place] [address]   Urine Culture     Status: Abnormal   Collection Time: 10/24/19  3:03 AM   Specimen: Urine, Random  Result Value Ref Range Status   Specimen Description   Final    URINE, RANDOM Performed at [facility], [address] [place], [place] [address]    Special Requests   Final    NONE Performed at [facility], [address] [place], [place] [address]    Culture MULTIPLE SPECIES PRESENT, SUGGEST RECOLLECTION (A)  Final   Report Status 10/25/2019 FINAL  Final     Radiology Studies: DG Chest Portable 1 View  Result Date: 10/23/2019 CLINICAL DATA:  Altered mental status EXAM: PORTABLE CHEST 1 VIEW COMPARISON:  10/14/2019 FINDINGS: Rotated. No new consolidation or edema. No pleural effusion or pneumothorax. Stable cardiomediastinal contours. IMPRESSION: No acute process in the chest. Electronically Signed   By: Guadlupe Spanish M.D.   On: 10/23/2019 18:57    Scheduled Meds: . aspirin EC  81 mg Oral Daily  . brimonidine  1 drop Both Eyes Daily   And  . timolol  1 drop Both Eyes Daily  . Chlorhexidine Gluconate Cloth  6 each Topical Q2200  . citalopram  20 mg Oral Daily  . donepezil  5 mg Oral QHS  . enoxaparin (LOVENOX) injection  40 mg Subcutaneous Q24H  . hydrALAZINE  25 mg Oral TID  . insulin aspart  0-5 Units Subcutaneous QHS  . insulin aspart  0-9 Units Subcutaneous TID WC  . insulin glargine  30 Units Subcutaneous QHS  . isosorbide mononitrate  30 mg Oral Daily  . loratadine  10 mg Oral Daily  . losartan  50 mg Oral Daily  . memantine  5 mg Oral QPM  . multivitamin with minerals  1 tablet Oral Daily  . pravastatin  40 mg Oral q1800   Continuous Infusions: . famotidine (PEPCID) IV 20 mg (10/24/19 1947)  . potassium PHOSPHATE IVPB (in  mmol) 30 mmol (10/25/19 1142)     LOS: 2 days   Time spent: Gertha Calkin, MD [facility] If 7PM-7AM, please contact night-coverage [email or web address] Password [facility] 10/25/2019, 2:14 PM

## 2019-10-26 DIAGNOSIS — E1169 Type 2 diabetes mellitus with other specified complication: Secondary | ICD-10-CM

## 2019-10-26 LAB — GLUCOSE, CAPILLARY
Glucose-Capillary: 204 mg/dL — ABNORMAL HIGH (ref 70–99)
Glucose-Capillary: 182 mg/dL — ABNORMAL HIGH (ref 70–99)
Glucose-Capillary: 185 mg/dL — ABNORMAL HIGH (ref 70–99)
Glucose-Capillary: 176 mg/dL — ABNORMAL HIGH (ref 70–99)

## 2019-10-26 LAB — BASIC METABOLIC PANEL
Anion gap: 8 (ref 5–15)
BUN: 17 mg/dL (ref 8–23)
CO2: 28 mmol/L (ref 22–32)
Calcium: 8.1 mg/dL — ABNORMAL LOW (ref 8.9–10.3)
Chloride: 102 mmol/L (ref 98–111)
Creatinine, Ser: 0.71 mg/dL (ref 0.44–1.00)
GFR calc Af Amer: >60 mL/min (ref >60)
GFR calc non Af Amer: >60 mL/min (ref >60)
Glucose, Bld: 240 mg/dL — ABNORMAL HIGH (ref 70–99)
Potassium: 3.6 mmol/L (ref 3.5–5.1)
Sodium: 138 mmol/L (ref 135–145)

## 2019-10-26 LAB — MAGNESIUM: Magnesium: 1.9 mg/dL (ref 1.7–2.4)

## 2019-10-26 LAB — PHOSPHORUS: Phosphorus: 1.6 mg/dL — ABNORMAL LOW (ref 2.5–4.6)

## 2019-10-26 LAB — PROCALCITONIN: Procalcitonin: <0.1 ng/mL

## 2019-10-26 MED ORDER — INSULIN ASPART 100 UNIT/ML ~~LOC~~ SOLN
3.0000 [IU] | Freq: Three times a day (TID) | SUBCUTANEOUS | Status: DC
  Administered 2019-10-26 – 2019-10-27 (×4): 3 [IU] via SUBCUTANEOUS
  Filled 2019-10-26 (×4): qty 1

## 2019-10-26 MED ORDER — FAMOTIDINE 20 MG PO TABS
20.0000 mg | ORAL_TABLET | Freq: Every day | ORAL | Status: DC
  Administered 2019-10-26: 20 mg via ORAL
  Filled 2019-10-26: qty 1

## 2019-10-26 MED ORDER — POTASSIUM PHOSPHATES 15 MMOLE/5ML IV SOLN
30.0000 mmol | Freq: Once | INTRAVENOUS | Status: AC
  Administered 2019-10-26: 30 mmol via INTRAVENOUS
  Filled 2019-10-26: qty 10

## 2019-10-26 MED ORDER — PANTOPRAZOLE SODIUM 40 MG PO TBEC
40.0000 mg | DELAYED_RELEASE_TABLET | Freq: Every day | ORAL | Status: DC
  Administered 2019-10-26 – 2019-10-27 (×2): 40 mg via ORAL
  Filled 2019-10-26 (×2): qty 1

## 2019-10-26 NOTE — NC FL2 (Signed)
[place] LEVEL OF CARE SCREENING TOOL     IDENTIFICATION  Patient Name: Vicki [name] Birthdate: Apr 23, 1941 Sex: female Admission Date (Current Location): 10/23/2019  [place] and [place] Number:  Engineering geologist and Address:  [facility], [address] [place], [place] [address]      Provider Number: [id number]  Attending Physician Name and Address:  Para Skeans, MD  Relative Name and Phone Number:  Amado Nash [phone]    Current Level of Care: Hospital Recommended Level of Care: [place] Prior Approval Number:    Date Approved/Denied:   PASRR Number: [id number]  Discharge Plan: SNF    Current Diagnoses: Patient Active Problem List   Diagnosis Date Noted  . DKA (diabetic ketoacidoses) ([place]) 10/14/2019  . Diabetic ketoacidosis without coma associated with diabetes mellitus due to underlying condition ([place])   . Dehydration 10/06/2019  . Generalized weakness 10/06/2019  . Acute kidney injury ([place]) 10/06/2019  . Hypertension   . Hypoglycemia 12/10/2017  . Mild dementia ([place]) 08/14/2017  . 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 NSTEMI (non-ST elevated myocardial infarction) ([place]) 10/01/2016  . 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 Diabetes ([place]) 09/29/2016  . GERD (gastroesophageal reflux disease) 09/29/2016  . Nausea and vomiting 03/24/2016  . Diffuse abdominal pain 03/24/2016  . Hypokalemia 03/24/2016  . Chest pain 03/24/2016  . Elevated troponin I level 09/26/2015  . 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[place]) 09/25/2015    Orientation RESPIRATION BLADDER Height & Weight     Self, Time, Situation, Place  Normal Continent, External catheter Weight: 66.1 kg Height:  5\' 4"  (162.6 cm)  BEHAVIORAL SYMPTOMS/MOOD NEUROLOGICAL BOWEL NUTRITION STATUS  Other (Comment)     Diet(carb modified)  AMBULATORY STATUS COMMUNICATION OF NEEDS Skin   Extensive Assist Verbally Normal                        Personal Care Assistance Level of Assistance  Bathing, Feeding, Dressing Bathing Assistance: Limited assistance Feeding assistance: Limited assistance Dressing Assistance: Limited assistance     Functional Limitations Info  Sight, Hearing, Speech Sight Info: Impaired(Wears glasses) Hearing Info: Adequate Speech Info: Adequate    SPECIAL CARE FACTORS FREQUENCY  PT (By licensed PT), OT (By licensed OT)     PT Frequency: 5 times per week OT Frequency: 5 times per week            Contractures Contractures Info: Not present    Additional Factors Info  Code Status Code Status Info: Full             Current Medications (10/26/2019):  This is the current hospital active medication list Current Facility-Administered Medications  Medication Dose Route Frequency Provider Last Rate Last Admin  . acetaminophen (TYLENOL) tablet 650 mg  650 mg Oral Q4H PRN Awilda Bill, NP      . aspirin EC tablet 81 mg  81 mg Oral Daily Awilda Bill, NP   81 mg at 10/26/19 0903  . brimonidine (ALPHAGAN) 0.2 % ophthalmic solution 1 drop  1 drop Both Eyes Daily Awilda Bill, NP   1 drop at 10/26/19 0904   And  . timolol (TIMOPTIC) 0.5 % ophthalmic solution 1 drop  1 drop Both Eyes Daily Awilda Bill, NP   1 drop at 10/26/19 0904  . Chlorhexidine Gluconate Cloth 2 % PADS 6 each  6 each Topical Q2200 Ottie Glazier, MD   6  each at 10/24/19 2210  . citalopram (CELEXA) tablet 20 mg  20 mg Oral Daily Eugenie Norrie, NP   20 mg at 10/25/19 9798  . dextrose 50 % solution 0-50 mL  0-50 mL Intravenous PRN Charlynne Pander, MD      . donepezil (ARICEPT) tablet 5 mg  5 mg Oral QHS Eugenie Norrie, NP   5 mg at 10/25/19 2139  . enoxaparin (LOVENOX) injection 40 mg  40 mg Subcutaneous Q24H Irena Cords V, MD   40 mg at 10/25/19 2139  . famotidine (PEPCID) IVPB 20 mg premix  20 mg Intravenous Q24H Eugenie Norrie, NP 100 mL/hr at 10/25/19 2133 20 mg at 10/25/19 2133  .  hydrALAZINE (APRESOLINE) tablet 25 mg  25 mg Oral TID Eugenie Norrie, NP   25 mg at 10/26/19 9211  . insulin aspart (novoLOG) injection 0-5 Units  0-5 Units Subcutaneous QHS Eugenie Norrie, NP   3 Units at 10/25/19 2137  . insulin aspart (novoLOG) injection 0-9 Units  0-9 Units Subcutaneous TID WC Eugenie Norrie, NP   3 Units at 10/26/19 9417  . insulin glargine (LANTUS) injection 30 Units  30 Units Subcutaneous QHS Eugenie Norrie, NP   30 Units at 10/26/19 0011  . isosorbide mononitrate (IMDUR) 24 hr tablet 30 mg  30 mg Oral Daily Eugenie Norrie, NP   30 mg at 10/26/19 4081  . loratadine (CLARITIN) tablet 10 mg  10 mg Oral Daily Eugenie Norrie, NP   10 mg at 10/26/19 4481  . losartan (COZAAR) tablet 50 mg  50 mg Oral Daily Eugenie Norrie, NP   50 mg at 10/25/19 1327  . memantine (NAMENDA) tablet 5 mg  5 mg Oral QPM Eugenie Norrie, NP   5 mg at 10/25/19 1751  . multivitamin with minerals tablet 1 tablet  1 tablet Oral Daily Eugenie Norrie, NP   1 tablet at 10/26/19 8563  . ondansetron (ZOFRAN) injection 4 mg  4 mg Intravenous Q6H PRN Eugenie Norrie, NP   4 mg at 10/23/19 2130  . pantoprazole (PROTONIX) injection 40 mg  40 mg Intravenous Q24H Gertha Calkin, MD   40 mg at 10/25/19 1605  . pravastatin (PRAVACHOL) tablet 40 mg  40 mg Oral q1800 Eugenie Norrie, NP   40 mg at 10/25/19 1751  . simethicone (MYLICON) chewable tablet 80 mg  80 mg Oral QID PRN Eugenie Norrie, NP         Discharge Medications: Please see discharge summary for a list of discharge medications.  Relevant Imaging Results:  Relevant Lab Results:   Additional Information [id number]  Ivy Puryear Clementeen Hoof, RN

## 2019-10-26 NOTE — Progress Notes (Signed)
Inpatient Diabetes Program Recommendations  AACE/ADA: New Consensus Statement on Inpatient Glycemic Control (2015)  Target Ranges:  Prepandial:   less than 140 mg/dL      Peak postprandial:   less than 180 mg/dL (1-2 hours)      Critically ill patients:  140 - 180 mg/dL   Lab Results  Component Value Date   GLUCAP 204 (H) 10/26/2019   HGBA1C 12.6 (H) 10/07/2019    Review of Glycemic Control Results for [place], Vicki [name] (MRN [id number]) as of 10/26/2019 10:04  Ref. Range 10/25/2019 07:37 10/25/2019 11:19 10/25/2019 17:44 10/25/2019 21:07 10/26/2019 07:43  Glucose-Capillary Latest Ref Range: 70 - 99 mg/dL 471 (H) 252 (H) 712 (H) 294 (H) 204 (H)   Diabetes history: DM Type 1 Outpatient Diabetes medications:  Humalog 4 units tid with meals (It appears that Toujeo was discontinued on d/c summary from 10/18/19) Current orders for Inpatient glycemic control:  Lantus 30 units q HS, Novolog sensitive tid with meals and HS  Inpatient Diabetes Program Recommendations:   Please add Novolog 3 units tid with meals for meal coverage (hold if patient eats less than 50%).    Thanks,  Beryl Meager, RN, BC-ADM Inpatient Diabetes Coordinator Pager [phone] (8a-5p)

## 2019-10-26 NOTE — Progress Notes (Signed)
PHARMACIST - PHYSICIAN COMMUNICATION   CONCERNING: IV to Oral Route Change Policy  RECOMMENDATION: This patient is receiving famotidine and protonix by the intravenous route.  Based on criteria approved by the Pharmacy and Therapeutics Committee, the intravenous medication(s) is/are being converted to the equivalent oral dose form(s).   DESCRIPTION: These criteria include:  The patient is eating (either orally or via tube) and/or has been taking other orally administered medications for a least 24 hours  The patient has no evidence of active gastrointestinal bleeding or impaired GI absorption (gastrectomy, short bowel, patient on TNA or NPO).  Gardner Candle, PharmD, BCPS Clinical Pharmacist 10/26/2019 10:56 AM

## 2019-10-26 NOTE — Care Management Important Message (Signed)
Important Message  Patient Details  Name: Vicki [name] MRN: [id number] Date of Birth: [birth year]   Medicare Important Message Given:  Yes Patient signed hard copy and it was placed in chart. Copy given to patient.      Trenton Founds, RN 10/26/2019, 9:54 AM

## 2019-10-26 NOTE — TOC Progression Note (Signed)
Transition of Care [facility]) - Progression Note    Patient Details  Name: Vicki [name] MRN: [id number] Date of Birth: [birth year]  [facility] of [facility]) CM/SW Contact  Barrie Dunker, RN Phone Number: 10/26/2019, 10:22 AM  Clinical Narrative:    Patient lives alone, we reviewed the bed choices and she accepted [facility], I notified Tresa Endo at [facility], Will need PT notes to start auth approval, updated the physician        Expected Discharge Plan and Services                                                 Social Determinants of Health (SDOH) Interventions    Readmission Risk Interventions Readmission Risk Prevention Plan 10/15/2019  Transportation Screening Complete  PCP or Specialist Appt within 3-5 Days Complete  HRI or Home Care Consult Complete  Medication Review (RN Care Manager) Complete  Some recent data might be hidden

## 2019-10-26 NOTE — Evaluation (Signed)
Physical Therapy Evaluation Patient Details Name: Vicki [name] MRN: [id number] DOB: 1941-03-28 Today's Date: 10/26/2019   History of Present Illness  This is an 79 yo female with a PMH of Stroke, MI, Lesion of Right Sciatic Nerve, HTN, GERD, Frequent hospitalizations due to DKA (due to medication noncompliance), and Uncontrolled Type I Diabetes Mellitus.  She was recently discharged from [facility] on 10/18/2019 following treatment of severe DKA.  She presented to [facility] ER via EMS on 03/20 with altered mental status and hyperglycemia. Upon arrival to the ER pt continued to have altered mental status with severe hyperglycemia.  Clinical Impression  Pt is a pleasant 79 year old female who was admitted for DKA with BG >600. Multiple readmissions within last 6 months for similar diagnosis. Pt performs bed mobility/transfers with cga and ambulation with min assist and RW. Pt demonstrates deficits with strength (L knee), mobility, and safety awareness. Pt nervous about going to SNF for fear of catching covid 19. Currently isn't at baseline level and is high falls risk. Would benefit from skilled PT to address above deficits and promote optimal return to PLOF; recommend transition to STR upon discharge from acute hospitalization.     Follow Up Recommendations SNF    Equipment Recommendations  None recommended by PT    Recommendations for Other Services       Precautions / Restrictions Precautions Precautions: Fall Restrictions Weight Bearing Restrictions: No      Mobility  Bed Mobility Overal bed mobility: Needs Assistance Bed Mobility: Supine to Sit     Supine to sit: Min guard     General bed mobility comments: safe technique with upright posture once seated at EOB.  Transfers Overall transfer level: Needs assistance Equipment used: Rolling walker (2 wheeled) Transfers: Sit to/from Stand Sit to Stand: Min guard         General transfer comment: needs cues for safe hand  placement. UPright posture. Slight L knee instability  Ambulation/Gait Ambulation/Gait assistance: Min assist Gait Distance (Feet): 20 Feet Assistive device: Rolling walker (2 wheeled) Gait Pattern/deviations: Step-through pattern     General Gait Details: slightly unsteady, keeps B knees in flexed position. Fatigues with increased exertion.   Stairs            Wheelchair Mobility    Modified Rankin (Stroke Patients Only)       Balance Overall balance assessment: Needs assistance Sitting-balance support: No upper extremity supported;Feet supported Sitting balance-Leahy Scale: Good     Standing balance support: Bilateral upper extremity supported Standing balance-Leahy Scale: Fair                               Pertinent Vitals/Pain Pain Assessment: No/denies pain    Home Living Family/patient expects to be discharged to:: Private residence Living Arrangements: Other relatives(cousin) Available Help at Discharge: Family;Available PRN/intermittently Type of Home: House Home Access: Stairs to enter Entrance Stairs-Rails: Right Entrance Stairs-Number of Steps: 4 Home Layout: One level Home Equipment: Cane - single point;Shower seat;Grab bars - tub/shower;Hand held Careers information officer - 2 wheels      Prior Function Level of Independence: Independent with assistive device(s)         Comments: reports increased pain from recent falls.     Hand Dominance        Extremity/Trunk Assessment   Upper Extremity Assessment Upper Extremity Assessment: Overall WFL for tasks assessed    Lower Extremity Assessment Lower Extremity Assessment: Generalized weakness(B  LE grossly 4/5)       Communication   Communication: No difficulties  Cognition Arousal/Alertness: Awake/alert Behavior During Therapy: WFL for tasks assessed/performed;Impulsive Overall Cognitive Status: Within Functional Limits for tasks assessed                                         General Comments      Exercises Other Exercises Other Exercises: supine ther-ex performed on B LE including AP, SLRs, and hip abd/add. All ther-ex performed x 10 reps with supervision   Assessment/Plan    PT Assessment Patient needs continued PT services  PT Problem List Decreased strength;Decreased activity tolerance;Decreased range of motion;Decreased balance;Decreased mobility;Decreased coordination;Decreased knowledge of use of DME;Decreased safety awareness;Decreased cognition       PT Treatment Interventions DME instruction;Gait training;Stair training;Therapeutic activities;Functional mobility training;Therapeutic exercise;Balance training;Neuromuscular re-education;Patient/family education    PT Goals (Current goals can be found in the Care Plan section)  Acute Rehab PT Goals Patient Stated Goal: to take better care of herself PT Goal Formulation: With patient Time For Goal Achievement: 11/09/19 Potential to Achieve Goals: Good    Frequency Min 2X/week   Barriers to discharge        Co-evaluation               AM-PAC PT "6 Clicks" Mobility  Outcome Measure Help needed turning from your back to your side while in a flat bed without using bedrails?: A Little Help needed moving from lying on your back to sitting on the side of a flat bed without using bedrails?: A Little Help needed moving to and from a bed to a chair (including a wheelchair)?: A Little Help needed standing up from a chair using your arms (e.g., wheelchair or bedside chair)?: A Little Help needed to walk in hospital room?: A Little Help needed climbing 3-5 steps with a railing? : A Lot 6 Click Score: 17    End of Session Equipment Utilized During Treatment: Gait belt Activity Tolerance: Patient tolerated treatment well Patient left: in chair;with chair alarm set Nurse Communication: Mobility status PT Visit Diagnosis: Muscle weakness (generalized) (M62.81);Difficulty in  walking, not elsewhere classified (R26.2);Unsteadiness on feet (R26.81);Other abnormalities of gait and mobility (R26.89)    Time: 6387-5643 PT Time Calculation (min) (ACUTE ONLY): 16 min   Charges:   PT Evaluation $PT Eval Low Complexity: 1 Low PT Treatments $Therapeutic Exercise: 8-22 mins        Greggory Stallion, PT, DPT [phone]   Gevorg Brum 10/26/2019, 1:12 PM

## 2019-10-26 NOTE — TOC Progression Note (Signed)
Transition of Care [facility]) - Progression Note    Patient Details  Name: Vicki [name] MRN: [id number] Date of Birth: 1941-06-23  Transition of Care [facility]) CM/SW Contact  Barrie Dunker, RN Phone Number: 10/26/2019, 9:45 AM  Clinical Narrative:    Patient was admitted and discharged on 3/15 at that time she refused SNF, She has determined that she needs to go to SNF for rehab, FL2, PASSR and Bedsearch sent, awating PT eval to send for auth approval, Will review bed offers once obtained        Expected Discharge Plan and Services                                                 Social Determinants of Health (SDOH) Interventions    Readmission Risk Interventions Readmission Risk Prevention Plan 10/15/2019  Transportation Screening Complete  PCP or Specialist Appt within 3-5 Days Complete  HRI or Home Care Consult Complete  Medication Review (RN Care Manager) Complete  Some recent data might be hidden

## 2019-10-26 NOTE — TOC Progression Note (Signed)
Transition of Care [facility]) - Progression Note    Patient Details  Name: Vicki [name] MRN: [id number] Date of Birth: 06/21/1941  Transition of Care [facility]) CM/SW Contact  Barrie Dunker, RN Phone Number: 10/26/2019, 4:14 PM  Clinical Narrative:     Barrie Dunker Health to start insurance auth to go to [facility] ref number [id number] Faxed clinical notes to [phone] I requested the physician to order covid test        Expected Discharge Plan and Services                                                 Social Determinants of Health (SDOH) Interventions    Readmission Risk Interventions Readmission Risk Prevention Plan 10/15/2019  Transportation Screening Complete  PCP or Specialist Appt within 3-5 Days Complete  HRI or Home Care Consult Complete  Medication Review (RN Care Manager) Complete  Some recent data might be hidden

## 2019-10-26 NOTE — Progress Notes (Signed)
PROGRESS NOTE    Vicki [name]  XIH:[id number] DOB: 1941-04-02 DOA: 10/23/2019 PCP: Lynnea Ferrier, MD   Brief Narrative:  This is an 79 yo female with a PMH of Stroke, MI, Lesion of Right Sciatic Nerve, HTN, GERD, Frequent hospitalizations due to DKA (due to medication noncompliance), and Uncontrolled Type I Diabetes Mellitus.  She was recently discharged from [facility] on 10/18/2019 following treatment of severe DKA.  She presented to [facility] ER via EMS on 03/20 with altered mental status and hyperglycemia. Upon arrival to the ER pt continued to have altered mental status with severe hyperglycemia.  Lab results ruled pt in for DKA, therefore insulin gtt initiated.  She also received 1.5L NS bolus, 1 amp of sodium bicarb, vancomycin, and cefepime. PCCM team contacted for ICU admission.    Pt was recently discharged for severe DKA on 10/18/19 due to noncomplaince, at that time pt refused SNF.  Assessment & Plan:   Active Problems:   DKA (diabetic ketoacidoses) (HCC) Resolved.  Pt to cont her lantus units nightly/ ssi/accuchecks .  Hypertension: Patient continued on hydralazine, losartan 50 daily.  GERD: PO PPI therapy.  Leucocytosis: We will obtain cx / pcl.   DVT prophylaxis: Lovenox Code Status: FULL Family Communication: None at bedside.  Disposition Plan:  D/C tomorrow awaiting insurance authorization. Subjective: Pt transferred to hospitalist team now she is stable and out of DKA. Pt is alert,awake and tolerating diet. Pt states she cannot go home and needs placement.  Labs show low phos and low potassium- replaced. ROS is negative.  3/23 Pt seen today she feel better and is able to eat no complaints today. Labs are stable.   Objective: Vitals:   10/26/19 0002 10/26/19 0742 10/26/19 0743 10/26/19 1704  BP: 137/73 (!) 157/89 (!) 157/89 (!) 157/87  Pulse: 85 73 73 80  Resp: 17 19 18 20   Temp: 98.5 F (36.9 C) 98.2 F (36.8 C) 98.2 F (36.8 C) 98.7 F (37.1 C)   TempSrc: Oral  Oral Oral  SpO2: 97% 98% 98% 100%  Weight:      Height:        Intake/Output Summary (Last 24 hours) at 10/26/2019 1857 Last data filed at 10/26/2019 0800 Gross per 24 hour  Intake 480 ml  Output -  Net 480 ml   Filed Weights   10/23/19 2045 10/24/19 0443 10/25/19 0500  Weight: 64.3 kg 64.3 kg 66.1 kg    Examination: General exam: Appears calm and comfortable  Respiratory system: Clear to auscultation. Respiratory effort normal. Cardiovascular system: S1 & S2 heard, RRR. No JVD, murmurs, rubs, gallops or clicks. No pedal edema. Gastrointestinal system: Abdomen is nondistended, soft and nontender. No organomegaly or masses felt. Normal bowel sounds heard. Central nervous system: Alert and oriented. No focal neurological deficits. Extremities: Symmetric 5 x 5 power. Skin: No rashes, lesions or ulcers Psychiatry: Judgement and insight appear normal. Mood & affect appropriate.   Data Reviewed:  CBC: Recent Labs  Lab 10/23/19 1800 10/25/19 0236  WBC 21.2* 21.9*  NEUTROABS 17.9* 16.9*  HGB 11.6* 10.1*  HCT 38.8 30.9*  MCV 102.4* 94.2  PLT 343 280   Basic Metabolic Panel: Recent Labs  Lab 10/23/19 2004 10/23/19 2243 10/24/19 0218 10/24/19 0620 10/24/19 1011 10/25/19 0401 10/26/19 0512  NA  --    < > 145 144 146* 144 138  K  --    < > 3.9 4.0 3.9 3.5 3.6  CL  --    < >  111 108 108 103 102  CO2  --    < > 15* 23 24 32 28  GLUCOSE  --    < > 278* 181* 188* 130* 240*  BUN  --    < > 37* 39* 37* 28* 17  CREATININE 1.87*   < > 1.47* 1.31* 1.27* 0.97 0.71  CALCIUM  --    < > 8.4* 8.4* 8.3* 7.7* 8.1*  MG 2.9*  --   --   --   --  2.1 1.9  PHOS 6.3*  --   --   --   --  1.7* 1.6*   < > = values in this interval not displayed.   GFR: Estimated Creatinine Clearance: 52.5 mL/min (by C-G formula based on SCr of 0.71 mg/dL).  CBG: Recent Labs  Lab 10/25/19 1744 10/25/19 2107 10/26/19 0743 10/26/19 1147 10/26/19 1657  GLUCAP 198* 294* 204* 182* 185*     Recent Labs  Lab 10/23/19 1820 10/23/19 2243 10/25/19 0236 10/25/19 1514 10/26/19 0512  PROCALCITON  --  0.27 0.37 0.20 <0.10  LATICACIDVEN 4.7*  --   --   --   --     Recent Results (from the past 240 hour(s))  SARS CORONAVIRUS 2 (TAT 6-24 HRS) Nasopharyngeal Nasopharyngeal Swab     Status: None   Collection Time: 10/17/19 10:18 PM   Specimen: Nasopharyngeal Swab  Result Value Ref Range Status   SARS Coronavirus 2 NEGATIVE NEGATIVE Final    Comment: (NOTE) SARS-CoV-2 target nucleic acids are NOT DETECTED. The SARS-CoV-2 RNA is generally detectable in upper and lower respiratory specimens during the acute phase of infection. Negative results do not preclude SARS-CoV-2 infection, do not rule out co-infections with other pathogens, and should not be used as the sole basis for treatment or other patient management decisions. Negative results must be combined with clinical observations, patient history, and epidemiological information. The expected result is Negative. Fact Sheet for Patients: HairSlick.no Fact Sheet for Healthcare Providers: quierodirigir.com This test is not yet approved or cleared by the [place] FDA and  has been authorized for detection and/or diagnosis of SARS-CoV-2 by FDA under an Emergency Use Authorization (EUA). This EUA will remain  in effect (meaning this test can be used) for the duration of the COVID-19 declaration under Section 56 4(b)(1) of the Act, 21 U.S.C. section 360bbb-3(b)(1), unless the authorization is terminated or revoked sooner. Performed at [facility], [address], [place], [place] [address]   Blood culture (routine x 2)     Status: None (Preliminary result)   Collection Time: 10/23/19  6:00 PM   Specimen: BLOOD  Result Value Ref Range Status   Specimen Description   Final    BLOOD Blood Culture results may not be optimal due to an inadequate volume of blood  received in culture bottles   Special Requests   Final    BOTTLES DRAWN AEROBIC AND ANAEROBIC RIGHT ANTECUBITAL   Culture   Final    NO GROWTH 3 DAYS Performed at [facility], [address] [place], [place] [address]    Report Status PENDING  Incomplete  Respiratory Panel by RT PCR (Flu A&B, Covid) - Nasopharyngeal Swab     Status: None   Collection Time: 10/23/19  7:34 PM   Specimen: Nasopharyngeal Swab  Result Value Ref Range Status   SARS Coronavirus 2 by RT PCR NEGATIVE NEGATIVE Final    Comment: (NOTE) SARS-CoV-2 target nucleic acids are NOT DETECTED. The  SARS-CoV-2 RNA is generally detectable in upper respiratoy specimens during the acute phase of infection. The lowest concentration of SARS-CoV-2 viral copies this assay can detect is 131 copies/mL. A negative result does not preclude SARS-Cov-2 infection and should not be used as the sole basis for treatment or other patient management decisions. A negative result may occur with  improper specimen collection/handling, submission of specimen other than nasopharyngeal swab, presence of viral mutation(s) within the areas targeted by this assay, and inadequate number of viral copies (<131 copies/mL). A negative result must be combined with clinical observations, patient history, and epidemiological information. The expected result is Negative. Fact Sheet for Patients:  PinkCheek.be Fact Sheet for Healthcare Providers:  GravelBags.it This test is not yet ap proved or cleared by the [place] FDA and  has been authorized for detection and/or diagnosis of SARS-CoV-2 by FDA under an Emergency Use Authorization (EUA). This EUA will remain  in effect (meaning this test can be used) for the duration of the COVID-19 declaration under Section 564(b)(1) of the Act, 21 U.S.C. section 360bbb-3(b)(1), unless the authorization is terminated or revoked sooner.     Influenza A by PCR NEGATIVE NEGATIVE Final   Influenza B by PCR NEGATIVE NEGATIVE Final    Comment: (NOTE) The Xpert Xpress SARS-CoV-2/FLU/RSV assay is intended as an aid in  the diagnosis of influenza from Nasopharyngeal swab specimens and  should not be used as a sole basis for treatment. Nasal washings and  aspirates are unacceptable for Xpert Xpress SARS-CoV-2/FLU/RSV  testing. Fact Sheet for Patients: PinkCheek.be Fact Sheet for Healthcare Providers: GravelBags.it This test is not yet approved or cleared by the [place] FDA and  has been authorized for detection and/or diagnosis of SARS-CoV-2 by  FDA under an Emergency Use Authorization (EUA). This EUA will remain  in effect (meaning this test can be used) for the duration of the  Covid-19 declaration under Section 564(b)(1) of the Act, 21  U.S.C. section 360bbb-3(b)(1), unless the authorization is  terminated or revoked. Performed at [facility], [address] [place], [place] [address]   Blood culture (routine x 2)     Status: None (Preliminary result)   Collection Time: 10/23/19  7:34 PM   Specimen: BLOOD  Result Value Ref Range Status   Specimen Description BLOOD BLOOD RIGHT HAND  Final   Special Requests   Final    BOTTLES DRAWN AEROBIC AND ANAEROBIC Blood Culture adequate volume   Culture   Final    NO GROWTH 3 DAYS Performed at [facility], [address], [place], [place] [address]    Report Status PENDING  Incomplete  MRSA PCR Screening     Status: None   Collection Time: 10/23/19 10:00 PM   Specimen: Nasal Mucosa; Nasopharyngeal  Result Value Ref Range Status   MRSA by PCR NEGATIVE NEGATIVE Final    Comment:        The GeneXpert MRSA Assay (FDA approved for NASAL specimens only), is one component of a comprehensive MRSA colonization surveillance program. It is not intended to diagnose MRSA infection nor to guide  or monitor treatment for MRSA infections. Performed at [facility], [address] [place], [place] [address]   Urine Culture     Status: Abnormal   Collection Time: 10/24/19  3:03 AM   Specimen: Urine, Random  Result Value Ref Range Status   Specimen Description   Final    URINE, RANDOM Performed at [facility], [address] [place], [place] [address]    Special Requests   Final    NONE Performed at [facility], [address] [place], [place] [address]    Culture MULTIPLE SPECIES PRESENT, SUGGEST RECOLLECTION (A)  Final   Report Status 10/25/2019 FINAL  Final  CULTURE, BLOOD (ROUTINE X 2) w Reflex to ID Panel     Status: None (Preliminary result)   Collection Time: 10/25/19  3:14 PM   Specimen: BLOOD  Result Value Ref Range Status   Specimen Description BLOOD BLOOD LEFT HAND  Final   Special Requests   Final    BOTTLES DRAWN AEROBIC AND ANAEROBIC Blood Culture results may not be optimal due to an inadequate volume of blood received in culture bottles   Culture   Final    NO GROWTH < 24 HOURS Performed at [facility], [address] [place], [place] [address]    Report Status PENDING  Incomplete  CULTURE, BLOOD (ROUTINE X 2) w Reflex to ID Panel     Status: None (Preliminary result)   Collection Time: 10/25/19  4:24 PM   Specimen: BLOOD  Result Value Ref Range Status   Specimen Description BLOOD BLOOD RIGHT HAND  Final   Special Requests   Final    BOTTLES DRAWN AEROBIC AND ANAEROBIC Blood Culture adequate volume   Culture   Final    NO GROWTH < 24 HOURS Performed at [facility], [address] [place], [place] [address]    Report Status PENDING  Incomplete     Radiology Studies: No results found.  Scheduled Meds: . aspirin EC  81 mg Oral Daily  . brimonidine  1 drop Both Eyes Daily   And  . timolol  1 drop Both Eyes Daily  . Chlorhexidine Gluconate Cloth  6 each Topical Q2200  . citalopram  20 mg  Oral Daily  . donepezil  5 mg Oral QHS  . enoxaparin (LOVENOX) injection  40 mg Subcutaneous Q24H  . famotidine  20 mg Oral Daily  . hydrALAZINE  25 mg Oral TID  . insulin aspart  0-5 Units Subcutaneous QHS  . insulin aspart  0-9 Units Subcutaneous TID WC  . insulin aspart  3 Units Subcutaneous TID WC  . insulin glargine  30 Units Subcutaneous QHS  . isosorbide mononitrate  30 mg Oral Daily  . loratadine  10 mg Oral Daily  . losartan  50 mg Oral Daily  . memantine  5 mg Oral QPM  . multivitamin with minerals  1 tablet Oral Daily  . pantoprazole  40 mg Oral Daily  . pravastatin  40 mg Oral q1800      LOS: 3 days   Time spent: Gertha Calkin, MD [facility] If 7PM-7AM, please contact night-coverage [email or web address] Password [facility] 10/26/2019, 6:57 PM

## 2019-10-26 NOTE — Evaluation (Signed)
Occupational Therapy Evaluation Patient Details Name: Vicki [name] MRN: [id number] DOB: Jun 05, 1941 Today's Date: 10/26/2019    History of Present Illness This is an 79 yo female with a PMH of Stroke, MI, Lesion of Right Sciatic Nerve, HTN, GERD, Frequent hospitalizations due to DKA (due to medication noncompliance), and Uncontrolled Type I Diabetes Mellitus.  She was recently discharged from [facility] on 10/18/2019 following treatment of severe DKA.  She presented to [facility] via EMS on 03/20 with altered mental status and hyperglycemia. Upon arrival to the ER pt continued to have altered mental status with severe hyperglycemia.   Clinical Impression   Patient seated in recliner upon entry and agreeable to therapy.  Vitals at 109/88, 87HR.  Patient noted feeling some pain/burning on L dorsal aspect of hand at IV site.  Notified nurse.  Patient states she is realizing she needs to take better care of herself or she will keep going to the hospital.  Patient is agreeable to SNF at discharge for increased therapy and assistance with medication.  Patient performing functional transfers and mobility using RW with CGA at this time.  Demonstrating poor impulse control and deficits in safety awareness.  The patient would benefit from continued occupational therapy services to address deficits in performance components outlined below.  Based on today's performance, recommending SNF with 24/7 SPV for follow up.    Follow Up Recommendations  SNF;Supervision/Assistance - 24 hour(patient would benefit from increased SPV at this time)    Equipment Recommendations  Other (comment)(defer to next level of care)    Recommendations for Other Services       Precautions / Restrictions Precautions Precautions: Fall Restrictions Weight Bearing Restrictions: No      Mobility Bed Mobility Overal bed mobility: Needs Assistance Bed Mobility: Supine to Sit     Supine to sit: Min guard     General bed mobility  comments: Patient seated in recliner upon entry  Transfers Overall transfer level: Needs assistance Equipment used: Rolling walker (2 wheeled) Transfers: Sit to/from [employer] Sit to Stand: Min guard Stand pivot transfers: Min guard       General transfer comment: cues for self pacing and correct body mechanics    Balance Overall balance assessment: Needs assistance Sitting-balance support: No upper extremity supported;Feet supported Sitting balance-Leahy Scale: Good     Standing balance support: Bilateral upper extremity supported;During functional activity Standing balance-Leahy Scale: Fair                             ADL either performed or assessed with clinical judgement   ADL Overall ADL's : Needs assistance/impaired     Grooming: Wash/dry hands;Wash/dry face;Oral care;Standing;Min guard               Lower Body Dressing: Min guard;Sit to/from stand;Cueing for safety   Toilet Transfer: Min guard;RW;Stand-pivot   Toileting- Architect and Hygiene: Min guard;Sit to/from stand         General ADL Comments: CGA for optimal safety during movement.  Providing cues for self pacing and optimal safety during activities.  Limited insight in to safety regarding IV line or self pacing     Vision Baseline Vision/History: Wears glasses Wears Glasses: At all times Patient Visual Report: No change from baseline       Perception     Praxis      Pertinent Vitals/Pain Pain Assessment: 0-10 Pain Score: 1  Pain Location: Pain at IV  site on dorsal aspect of L hand Pain Descriptors / Indicators: Burning Pain Intervention(s): Monitored during session;Other (comment)(contact nurse)     Hand Dominance Right   Extremity/Trunk Assessment Upper Extremity Assessment Upper Extremity Assessment: Overall WFL for tasks assessed   Lower Extremity Assessment Lower Extremity Assessment: Defer to PT evaluation   Cervical / Trunk  Assessment Cervical / Trunk Assessment: Normal   Communication Communication Communication: No difficulties   Cognition Arousal/Alertness: Awake/alert Behavior During Therapy: WFL for tasks assessed/performed;Impulsive Overall Cognitive Status: Within Functional Limits for tasks assessed                                 General Comments: Per chart, 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  Noted patient impulsive during functional transfers/mobility.   General Comments       Exercises Other Exercises Other Exercises: Provided education on goals and role of OT in acute care Other Exercises: Provided general safety education on use of call light, bed controls, etc Other Exercises: Completed functional transfers/mobility using RW with CGA.  Patient requires cues for safety during tasks.  Poor insight into own impulsivity and safety regarding IV line/etc Other Exercises: Completed LB dressing (socks) while seated with SBA.   Shoulder Instructions      Home Living Family/patient expects to be discharged to:: Private residence Living Arrangements: Other relatives(Cousin) Available Help at Discharge: Family;Available PRN/intermittently Type of Home: House Home Access: Stairs to enter [employer] of Steps: 4 Entrance Stairs-Rails: Right Home Layout: One level     Bathroom Shower/Tub: Occupational psychologist: Handicapped height Bathroom Accessibility: Yes How Accessible: Accessible via walker Home Equipment: [place] - single point;Shower seat;Grab bars - tub/shower;Hand held Tourist information centre manager - 2 wheels          Prior Functioning/Environment Level of Independence: Independent with assistive device(s)        Comments: Patient states she was completing BADLs at MOD I level using RW        OT Problem List: Decreased strength;Decreased activity tolerance;Decreased safety awareness;Decreased knowledge of use of DME or AE      OT  Treatment/Interventions: Self-care/ADL training;Therapeutic exercise;Energy conservation;DME and/or AE instruction;Therapeutic activities;Patient/family education    OT Goals(Current goals can be found in the care plan section) Acute Rehab OT Goals Patient Stated Goal: "Do better for myself" OT Goal Formulation: With patient Time For Goal Achievement: 11/09/19 Potential to Achieve Goals: Good  OT Frequency: Min 1X/week   Barriers to D/C: Decreased caregiver support;Other (comment)  Patient recently discharged home.  Poor prognosis due to poor medication compliance.       Co-evaluation              AM-PAC OT "6 Clicks" Daily Activity     Outcome Measure Help from another person eating meals?: None Help from another person taking care of personal grooming?: A Little Help from another person toileting, which includes using toliet, bedpan, or urinal?: A Little Help from another person bathing (including washing, rinsing, drying)?: A Little Help from another person to put on and taking off regular upper body clothing?: None Help from another person to put on and taking off regular lower body clothing?: A Little 6 Click Score: 20   End of Session Equipment Utilized During Treatment: Rolling walker;Gait belt Nurse Communication: Other (comment)(patient with pain at IV site on L hand)  Activity Tolerance: Patient tolerated treatment well Patient left: in chair;with call bell/phone  within reach;with nursing/sitter in room;with chair alarm set  OT Visit Diagnosis: Unsteadiness on feet (R26.81);Other abnormalities of gait and mobility (R26.89);Repeated falls (R29.6)                Time: 6067-7034 OT Time Calculation (min): 26 min Charges:  OT General Charges $OT Visit: 1 Visit OT Evaluation $OT Eval Low Complexity: 1 Low OT Treatments $Therapeutic Activity: 23-37 mins  Louanne Belton, MS, OTR/L 10/26/19, 1:52 PM

## 2019-10-27 DIAGNOSIS — F0280 Dementia in other diseases classified elsewhere without behavioral disturbance: Secondary | ICD-10-CM

## 2019-10-27 DIAGNOSIS — I10 Essential (primary) hypertension: Secondary | ICD-10-CM

## 2019-10-27 DIAGNOSIS — G309 Alzheimer's disease, unspecified: Secondary | ICD-10-CM

## 2019-10-27 DIAGNOSIS — N179 Acute kidney failure, unspecified: Secondary | ICD-10-CM

## 2019-10-27 LAB — PROCALCITONIN: Procalcitonin: 0.14 ng/mL

## 2019-10-27 LAB — URINE CULTURE: Culture: NO GROWTH

## 2019-10-27 LAB — GLUCOSE, CAPILLARY
Glucose-Capillary: 103 mg/dL — ABNORMAL HIGH (ref 70–99)
Glucose-Capillary: 240 mg/dL — ABNORMAL HIGH (ref 70–99)

## 2019-10-27 LAB — BLOOD GAS, VENOUS
Acid-base deficit: 19.8 mmol/L — ABNORMAL HIGH (ref 0.0–2.0)
Bicarbonate: 6.3 mmol/L — ABNORMAL LOW (ref 20.0–28.0)
O2 Saturation: 41.7 %
Patient temperature: 37
pCO2, Ven: <19 mmHg — CL (ref 44.0–60.0)
pH, Ven: 7.18 — CL (ref 7.250–7.430)

## 2019-10-27 LAB — BASIC METABOLIC PANEL
Anion gap: 8 (ref 5–15)
BUN: 14 mg/dL (ref 8–23)
CO2: 28 mmol/L (ref 22–32)
Calcium: 8.7 mg/dL — ABNORMAL LOW (ref 8.9–10.3)
Chloride: 104 mmol/L (ref 98–111)
Creatinine, Ser: 0.73 mg/dL (ref 0.44–1.00)
GFR calc Af Amer: >60 mL/min (ref >60)
GFR calc non Af Amer: >60 mL/min (ref >60)
Glucose, Bld: 108 mg/dL — ABNORMAL HIGH (ref 70–99)
Potassium: 4.3 mmol/L (ref 3.5–5.1)
Sodium: 140 mmol/L (ref 135–145)

## 2019-10-27 LAB — RESPIRATORY PANEL BY RT PCR (FLU A&B, COVID)
Influenza A by PCR: NEGATIVE
Influenza B by PCR: NEGATIVE
SARS Coronavirus 2 by RT PCR: NEGATIVE

## 2019-10-27 MED ORDER — INSULIN GLARGINE 100 UNIT/ML ~~LOC~~ SOLN: 30.0000 [IU] | Freq: Every day | SUBCUTANEOUS | 11 refills | Status: DC

## 2019-10-27 MED ORDER — INSULIN ASPART 100 UNIT/ML ~~LOC~~ SOLN: 3.0000 [IU] | Freq: Three times a day (TID) | SUBCUTANEOUS | 11 refills | Status: DC

## 2019-10-27 MED ORDER — HYDRALAZINE HCL 50 MG PO TABS: 25.0000 mg | ORAL_TABLET | Freq: Three times a day (TID) | ORAL | 0 refills | Status: DC

## 2019-10-27 NOTE — TOC Transition Note (Signed)
Transition of Care [facility]) - CM/SW Discharge Note   Patient Details  Name: Vicki [name] MRN: [id number] Date of Birth: 05-06-1941  Transition of Care [facility]) CM/SW Contact:  Barrie Dunker, RN Phone Number: 10/27/2019, 1:14 PM   Clinical Narrative:       Final next level of care: Skilled Nursing Facility     Patient Goals and CMS Choice        Discharge Placement              Patient chooses bed at: [facility] Patient to be transferred to facility by: ems Name of family member notified: sallie Patient and family notified of of transfer: 10/27/19  Discharge Plan and Services                                     Social Determinants of Health (SDOH) Interventions     Readmission Risk Interventions Readmission Risk Prevention Plan 10/15/2019  Transportation Screening Complete  PCP or Specialist Appt within 3-5 Days Complete  HRI or Home Care Consult Complete  Medication Review (RN Care Manager) Complete  Some recent data might be hidden

## 2019-10-27 NOTE — TOC Transition Note (Signed)
Transition of Care [facility]) - CM/SW Discharge Note   Patient Details  Name: Pricila Bridge MRN: [id number] Date of Birth: 1941/04/20  Transition of Care [facility]) CM/SW Contact:  Barrie Dunker, RN Phone Number: 10/27/2019, 1:17 PM   Clinical Narrative:     Patient to dc to [facility] room 8A the bedside nurse to call report to [facility], the DC packet is on the chart, notified the sister Sallie of the DC, RNCM called EMS to transport. There are 5 ahead of her  Final next level of care: Skilled Nursing Facility     Patient Goals and CMS Choice        Discharge Placement              Patient chooses bed at: [facility] Patient to be transferred to facility by: ems Name of family member notified: sallie Patient and family notified of of transfer: 10/27/19  Discharge Plan and Services                                     Social Determinants of Health (SDOH) Interventions     Readmission Risk Interventions Readmission Risk Prevention Plan 10/15/2019  Transportation Screening Complete  PCP or Specialist Appt within 3-5 Days Complete  HRI or Home Care Consult Complete  Medication Review (RN Care Manager) Complete  Some recent data might be hidden

## 2019-10-27 NOTE — Discharge Summary (Signed)
[facility] [place] at [facility]   PATIENT NAME: Vicki [name]    MR#:  [id number]  DATE OF BIRTH:  [birth year]  DATE OF ADMISSION:  10/23/2019 ADMITTING PHYSICIAN: Gertha Calkin, MD  DATE OF DISCHARGE:10/27/2019  PRIMARY CARE PHYSICIAN: Lynnea Ferrier, MD    ADMISSION DIAGNOSIS:  DKA (diabetic ketoacidoses) (HCC) [E11.10] Diabetic ketoacidosis without coma associated with type 2 diabetes mellitus (HCC) [E11.10]  DISCHARGE DIAGNOSIS:  DKA without coma type II diabetes uncontrolled acute renal failure-- resolved  SECONDARY DIAGNOSIS:   Past Medical History:  Diagnosis Date  . Diabetes mellitus without complication (HCC)   . GERD (gastroesophageal reflux disease)   . Hypertension   . Lesion of sciatic nerve, right side   . MI (myocardial infarction) (HCC)   . Stroke [facility])     HOSPITAL COURSE:  79 yo female with a PMH of Stroke, MI, Lesion of Right Sciatic Nerve, HTN, GERD, Frequent hospitalizations due to DKA (due to medication noncompliance), and Uncontrolled Type I Diabetes Mellitus. She was recently discharged from [facility] on 10/18/2019 following treatment of severe DKA. She presented to [facility] ER via EMS on 03/20 with altered mental status and hyperglycemia.  Recurrent DKA (diabetic ketoacidoses) (HCC) type II diabetes, uncontrolled with hyperglycemia with renal complications -Resolved.  -Creatinine back to baseline -Pt to cont her lantus units nightly/ ssi/accuchecks . -Patient currently on Lantus 30 units and lisPro three units TID with meals -patient will follow-up with Dr. Tedd Sias endocrinology as outpatient  Acute kidney injury resolved secondary to hyperglycemia the setting of DKA creatinine on admission was 1.9 -creatinine at baseline .08 -received IV hydration  Hypertension: Patient continued on hydralazine, losartan 50 daily.  GERD: PO PPI therapy.  Leucocytosis: appears reactive  Chronic dementia continue Aricept and  Namenda  Hyperlipidemia continue statins  Overall feels better. Her repeat COVID is negative. She can discharge to [facility] today  DVT prophylaxis: Lovenox Code Status: FULL  Family Communication: None at bedside.  Disposition Plan:  D/C today \  CONSULTS OBTAINED:    DRUG ALLERGIES:  No Known Allergies  DISCHARGE MEDICATIONS:   Allergies as of 10/27/2019   No Known Allergies     Medication List    STOP taking these medications   insulin lispro 100 UNIT/ML injection Commonly known as: HUMALOG     TAKE these medications   acetaminophen 500 MG tablet Commonly known as: TYLENOL Take 1,000-1,500 mg by mouth every 6 (six) hours as needed for mild pain or headache.   aspirin 81 MG EC tablet Take 1 tablet (81 mg total) by mouth daily.   Centrum Adults Tabs Take 1 tablet by mouth every morning.   citalopram 20 MG tablet Commonly known as: CELEXA Take 20 mg by mouth daily.   Combigan 0.2-0.5 % ophthalmic solution Generic drug: brimonidine-timolol Place 1 drop into both eyes daily.   donepezil 5 MG tablet Commonly known as: ARICEPT Take 5 mg by mouth at bedtime.   Ensure Max Protein Liqd Take 330 mLs (11 oz total) by mouth 2 (two) times daily between meals.   famotidine 20 MG tablet Commonly known as: PEPCID Take 1 tablet (20 mg total) by mouth daily.   fluticasone 50 MCG/ACT nasal spray Commonly known as: FLONASE Place 2 sprays into both nostrils daily.   hydrALAZINE 50 MG tablet Commonly known as: APRESOLINE Take 0.5 tablets (25 mg total) by mouth 3 (three) times daily.   insulin aspart 100 UNIT/ML injection Commonly known as: novoLOG Inject 3  Units into the skin 3 (three) times daily with meals.   insulin glargine 100 UNIT/ML injection Commonly known as: LANTUS Inject 0.3 mLs (30 Units total) into the skin at bedtime.   isosorbide mononitrate 30 MG 24 hr tablet Commonly known as: IMDUR Take 1 tablet (30 mg total) by mouth daily.    loratadine 10 MG tablet Commonly known as: CLARITIN Take 10 mg by mouth daily.   losartan 50 MG tablet Commonly known as: COZAAR Take 1 tablet (50 mg total) by mouth daily.   lovastatin 40 MG tablet Commonly known as: MEVACOR Take 40 mg by mouth at bedtime.   Magnesium Oxide 420 MG Tabs Take 420 mg by mouth daily.   memantine 5 MG tablet Commonly known as: NAMENDA Take 5 mg by mouth 2 (two) times daily.   polyethylene glycol 17 g packet Commonly known as: MIRALAX / GLYCOLAX Take 17 g by mouth daily.   saccharomyces boulardii 250 MG capsule Commonly known as: FLORASTOR Take 250 mg by mouth 2 (two) times daily.   simethicone 80 MG chewable tablet Commonly known as: MYLICON Chew 1 tablet (80 mg total) by mouth 4 (four) times daily as needed for flatulence.       If you experience worsening of your admission symptoms, develop shortness of breath, life threatening emergency, suicidal or homicidal thoughts you must seek medical attention immediately by calling 911 or calling your MD immediately  if symptoms less severe.  You Must read complete instructions/literature along with all the possible adverse reactions/side effects for all the Medicines you take and that have been prescribed to you. Take any new Medicines after you have completely understood and accept all the possible adverse reactions/side effects.   Please note  You were cared for by a hospitalist during your hospital stay. If you have any questions about your discharge medications or the care you received while you were in the hospital after you are discharged, you can call the unit and asked to speak with the hospitalist on call if the hospitalist that took care of you is not available. Once you are discharged, your primary care physician will handle any further medical issues. Please note that NO REFILLS for any discharge medications will be authorized once you are discharged, as it is imperative that you return to  your primary care physician (or establish a relationship with a primary care physician if you do not have one) for your aftercare needs so that they can reassess your need for medications and monitor your lab values. Today   SUBJECTIVE   No new complaints. No new issues per RN. Sugar stable  VITAL SIGNS:  Blood pressure (!) 152/84, pulse 76, temperature 98.1 F (36.7 C), temperature source Oral, resp. rate 18, height 5\' 4"  (1.626 m), weight 64.8 kg, SpO2 100 %.  I/O:  No intake or output data in the 24 hours ending 10/27/19 1300  PHYSICAL EXAMINATION:  GENERAL:  79 y.o.-year-old patient lying in the bed with no acute distress.  EYES: Pupils equal, round, reactive to light and accommodation. No scleral icterus.  HEENT: Head atraumatic, normocephalic. Oropharynx and nasopharynx clear.  NECK:  Supple, no jugular venous distention. No thyroid enlargement, no tenderness.  LUNGS: Normal breath sounds bilaterally, no wheezing, rales,rhonchi or crepitation. No use of accessory muscles of respiration.  CARDIOVASCULAR: S1, S2 normal. No murmurs, rubs, or gallops.  ABDOMEN: Soft, non-tender, non-distended. Bowel sounds present. No organomegaly or mass.  EXTREMITIES: No pedal edema, cyanosis, or clubbing.  NEUROLOGIC: Cranial  nerves II through XII are intact. Muscle strength 5/5 in all extremities. Sensation intact. Gait not checked.  PSYCHIATRIC: The patient is alert and oriented x 3.  SKIN: No obvious rash, lesion, or ulcer.   DATA REVIEW:   CBC  Recent Labs  Lab 10/25/19 0236  WBC 21.9*  HGB 10.1*  HCT 30.9*  PLT 280    Chemistries  Recent Labs  Lab 10/26/19 0512 10/26/19 0512 10/27/19 0534  NA 138   < > 140  K 3.6   < > 4.3  CL 102   < > 104  CO2 28   < > 28  GLUCOSE 240*   < > 108*  BUN 17   < > 14  CREATININE 0.71   < > 0.73  CALCIUM 8.1*   < > 8.7*  MG 1.9  --   --    < > = values in this interval not displayed.    Microbiology Results   Recent Results (from the  past 240 hour(s))  SARS CORONAVIRUS 2 (TAT 6-24 HRS) Nasopharyngeal Nasopharyngeal Swab     Status: None   Collection Time: 10/17/19 10:18 PM   Specimen: Nasopharyngeal Swab  Result Value Ref Range Status   SARS Coronavirus 2 NEGATIVE NEGATIVE Final    Comment: (NOTE) SARS-CoV-2 target nucleic acids are NOT DETECTED. The SARS-CoV-2 RNA is generally detectable in upper and lower respiratory specimens during the acute phase of infection. Negative results do not preclude SARS-CoV-2 infection, do not rule out co-infections with other pathogens, and should not be used as the sole basis for treatment or other patient management decisions. Negative results must be combined with clinical observations, patient history, and epidemiological information. The expected result is Negative. Fact Sheet for Patients: HairSlick.nohttps://www.fda.gov/media/138098/download Fact Sheet for Healthcare Providers: quierodirigir.comhttps://www.fda.gov/media/138095/download This test is not yet approved or cleared by the [place] FDA and  has been authorized for detection and/or diagnosis of SARS-CoV-2 by FDA under an Emergency Use Authorization (EUA). This EUA will remain  in effect (meaning this test can be used) for the duration of the COVID-19 declaration under Section 56 4(b)(1) of the Act, 21 U.S.C. section 360bbb-3(b)(1), unless the authorization is terminated or revoked sooner. Performed at [facility], [address] [place], [place] [address]   Blood culture (routine x 2)     Status: None (Preliminary result)   Collection Time: 10/23/19  6:00 PM   Specimen: BLOOD  Result Value Ref Range Status   Specimen Description   Final    BLOOD Blood Culture results may not be optimal due to an inadequate volume of blood received in culture bottles   Special Requests   Final    BOTTLES DRAWN AEROBIC AND ANAEROBIC RIGHT ANTECUBITAL   Culture   Final    NO GROWTH 4 DAYS Performed at [facility], [address] [place], [place] [address]    Report Status PENDING  Incomplete  Respiratory Panel by RT PCR (Flu A&B, Covid) - Nasopharyngeal Swab     Status: None   Collection Time: 10/23/19  7:34 PM   Specimen: Nasopharyngeal Swab  Result Value Ref Range Status   SARS Coronavirus 2 by RT PCR NEGATIVE NEGATIVE Final    Comment: (NOTE) SARS-CoV-2 target nucleic acids are NOT DETECTED. The SARS-CoV-2 RNA is generally detectable in upper respiratoy specimens during the acute phase of infection. The lowest concentration of SARS-CoV-2 viral copies this assay can detect is 131 copies/mL. A negative result does not preclude  SARS-Cov-2 infection and should not be used as the sole basis for treatment or other patient management decisions. A negative result may occur with  improper specimen collection/handling, submission of specimen other than nasopharyngeal swab, presence of viral mutation(s) within the areas targeted by this assay, and inadequate number of viral copies (<131 copies/mL). A negative result must be combined with clinical observations, patient history, and epidemiological information. The expected result is Negative. Fact Sheet for Patients:  https://www.moore.com/ Fact Sheet for Healthcare Providers:  https://www.young.biz/ This test is not yet ap proved or cleared by the [place] FDA and  has been authorized for detection and/or diagnosis of SARS-CoV-2 by FDA under an Emergency Use Authorization (EUA). This EUA will remain  in effect (meaning this test can be used) for the duration of the COVID-19 declaration under Section 564(b)(1) of the Act, 21 U.S.C. section 360bbb-3(b)(1), unless the authorization is terminated or revoked sooner.    Influenza A by PCR NEGATIVE NEGATIVE Final   Influenza B by PCR NEGATIVE NEGATIVE Final    Comment: (NOTE) The Xpert Xpress SARS-CoV-2/FLU/RSV assay is intended as an aid in  the diagnosis of influenza from  Nasopharyngeal swab specimens and  should not be used as a sole basis for treatment. Nasal washings and  aspirates are unacceptable for Xpert Xpress SARS-CoV-2/FLU/RSV  testing. Fact Sheet for Patients: https://www.moore.com/ Fact Sheet for Healthcare Providers: https://www.young.biz/ This test is not yet approved or cleared by the [place] FDA and  has been authorized for detection and/or diagnosis of SARS-CoV-2 by  FDA under an Emergency Use Authorization (EUA). This EUA will remain  in effect (meaning this test can be used) for the duration of the  Covid-19 declaration under Section 564(b)(1) of the Act, 21  U.S.C. section 360bbb-3(b)(1), unless the authorization is  terminated or revoked. Performed at [facility], [address] [place], [place] [address]   Blood culture (routine x 2)     Status: None (Preliminary result)   Collection Time: 10/23/19  7:34 PM   Specimen: BLOOD  Result Value Ref Range Status   Specimen Description BLOOD BLOOD RIGHT HAND  Final   Special Requests   Final    BOTTLES DRAWN AEROBIC AND ANAEROBIC Blood Culture adequate volume   Culture   Final    NO GROWTH 4 DAYS Performed at [facility], [address], [place], [place] [address]    Report Status PENDING  Incomplete  MRSA PCR Screening     Status: None   Collection Time: 10/23/19 10:00 PM   Specimen: Nasal Mucosa; Nasopharyngeal  Result Value Ref Range Status   MRSA by PCR NEGATIVE NEGATIVE Final    Comment:        The GeneXpert MRSA Assay (FDA approved for NASAL specimens only), is one component of a comprehensive MRSA colonization surveillance program. It is not intended to diagnose MRSA infection nor to guide or monitor treatment for MRSA infections. Performed at [facility], [address], [place], [place] [address]   Urine Culture     Status: Abnormal   Collection Time: 10/24/19  3:03 AM   Specimen:  Urine, Random  Result Value Ref Range Status   Specimen Description   Final    URINE, RANDOM Performed at [facility], [address], [place], [place] [address]    Special Requests   Final    NONE Performed at [facility], [address] [place], [place] [address]    Culture MULTIPLE SPECIES PRESENT,  SUGGEST RECOLLECTION (A)  Final   Report Status 10/25/2019 FINAL  Final  CULTURE, BLOOD (ROUTINE X 2) w Reflex to ID Panel     Status: None (Preliminary result)   Collection Time: 10/25/19  3:14 PM   Specimen: BLOOD  Result Value Ref Range Status   Specimen Description BLOOD BLOOD LEFT HAND  Final   Special Requests   Final    BOTTLES DRAWN AEROBIC AND ANAEROBIC Blood Culture results may not be optimal due to an inadequate volume of blood received in culture bottles   Culture   Final    NO GROWTH 2 DAYS Performed at [facility], [address] [place], [place] [address]    Report Status PENDING  Incomplete  CULTURE, BLOOD (ROUTINE X 2) w Reflex to ID Panel     Status: None (Preliminary result)   Collection Time: 10/25/19  4:24 PM   Specimen: BLOOD  Result Value Ref Range Status   Specimen Description BLOOD BLOOD RIGHT HAND  Final   Special Requests   Final    BOTTLES DRAWN AEROBIC AND ANAEROBIC Blood Culture adequate volume   Culture   Final    NO GROWTH 2 DAYS Performed at [facility], [address] [place], [place] [address]    Report Status PENDING  Incomplete  Urine Culture     Status: None   Collection Time: 10/26/19  8:30 AM   Specimen: Urine, Clean Catch  Result Value Ref Range Status   Specimen Description   Final    URINE, CLEAN CATCH Performed at [facility], [address], [place], [place] [address]    Special Requests   Final    NONE Performed at [facility], [address] [place], [place] [address]    Culture   Final    NO GROWTH Performed at [facility], [address],  [place], [place] [address]    Report Status 10/27/2019 FINAL  Final  Respiratory Panel by RT PCR (Flu A&B, Covid) - Nasopharyngeal Swab     Status: None   Collection Time: 10/27/19 10:38 AM   Specimen: Nasopharyngeal Swab  Result Value Ref Range Status   SARS Coronavirus 2 by RT PCR NEGATIVE NEGATIVE Final    Comment: (NOTE) SARS-CoV-2 target nucleic acids are NOT DETECTED. The SARS-CoV-2 RNA is generally detectable in upper respiratoy specimens during the acute phase of infection. The lowest concentration of SARS-CoV-2 viral copies this assay can detect is 131 copies/mL. A negative result does not preclude SARS-Cov-2 infection and should not be used as the sole basis for treatment or other patient management decisions. A negative result may occur with  improper specimen collection/handling, submission of specimen other than nasopharyngeal swab, presence of viral mutation(s) within the areas targeted by this assay, and inadequate number of viral copies (<131 copies/mL). A negative result must be combined with clinical observations, patient history, and epidemiological information. The expected result is Negative. Fact Sheet for Patients:  https://www.moore.com/ Fact Sheet for Healthcare Providers:  https://www.young.biz/ This test is not yet ap proved or cleared by the [place] FDA and  has been authorized for detection and/or diagnosis of SARS-CoV-2 by FDA under an Emergency Use Authorization (EUA). This EUA will remain  in effect (meaning this test can be used) for the duration of the COVID-19 declaration under Section 564(b)(1) of the Act, 21 U.S.C. section 360bbb-3(b)(1), unless the authorization is terminated or revoked sooner.    Influenza A by PCR NEGATIVE NEGATIVE Final  Influenza B by PCR NEGATIVE NEGATIVE Final    Comment: (NOTE) The Xpert Xpress SARS-CoV-2/FLU/RSV assay is intended as an aid in  the diagnosis of influenza from  Nasopharyngeal swab specimens and  should not be used as a sole basis for treatment. Nasal washings and  aspirates are unacceptable for Xpert Xpress SARS-CoV-2/FLU/RSV  testing. Fact Sheet for Patients: PinkCheek.be Fact Sheet for Healthcare Providers: GravelBags.it This test is not yet approved or cleared by the [place] FDA and  has been authorized for detection and/or diagnosis of SARS-CoV-2 by  FDA under an Emergency Use Authorization (EUA). This EUA will remain  in effect (meaning this test can be used) for the duration of the  Covid-19 declaration under Section 564(b)(1) of the Act, 21  U.S.C. section 360bbb-3(b)(1), unless the authorization is  terminated or revoked. Performed at [facility], [address] [place], [place] [address]     RADIOLOGY:  No results found.   CODE STATUS:     Code Status Orders  (From admission, onward)         Start     Ordered   10/23/19 1939  Full code  Continuous     10/23/19 1942        Code Status History    Date Active Date Inactive Code Status Order ID Comments User Context   10/14/2019 1512 10/18/2019 2126 Full Code [id number]  Lavina Hamman, MD ED   10/06/2019 1121 10/10/2019 2035 Full Code [id number]  Collier Bullock, MD ED   12/10/2017 1526 12/11/2017 2011 Full Code [id number]  Demetrios Loll, MD Inpatient   12/02/2017 1630 12/04/2017 1556 Full Code [id number]  Gladstone Lighter, MD ED   09/29/2016 2129 10/04/2016 1712 Full Code [id number]  Quintella Baton, MD Inpatient   03/22/2016 1747 03/24/2016 0600 DNR [id number]  Demetrios Loll, MD Inpatient   09/27/2015 1103 09/28/2015 1831 DNR [id number]  Mikael Spray, NP Inpatient   09/27/2015 1051 09/27/2015 1051 Partial Code [id number]  Mikael Spray, NP Inpatient   09/25/2015 2317 09/27/2015 1051 DNR [id number]  Harrie Foreman, MD Inpatient   09/25/2015 2138 09/25/2015 2317 Full Code [id number]  Harrie Foreman, MD Inpatient    Advance Care Planning Activity       TOTAL TIME TAKING CARE OF THIS PATIENT: *40* minutes.    Fritzi Mandes M.D  [facility]    CC: Primary care physician; Adin Hector, MD

## 2019-10-27 NOTE — TOC Progression Note (Signed)
Transition of Care [facility]) - Progression Note    Patient Details  Name: Vicki [name] MRN: [id number] Date of Birth: March 28, 1941  [facility] CM/SW Contact  Barrie Dunker, RN Phone Number: 10/27/2019, 11:49 AM  Clinical Narrative:     Received notification that the auth was approved and the patient can DC when medically ready, Notified the physician       Expected Discharge Plan and Services                                                 Social Determinants of Health (SDOH) Interventions    Readmission Risk Interventions Readmission Risk Prevention Plan 10/15/2019  Transportation Screening Complete  PCP or Specialist Appt within 3-5 Days Complete  HRI or Home Care Consult Complete  Medication Review (RN Care Manager) Complete  Some recent data might be hidden

## 2019-10-28 LAB — CULTURE, BLOOD (ROUTINE X 2)
Culture: NO GROWTH
Culture: NO GROWTH
Special Requests: ADEQUATE

## 2019-10-30 LAB — CULTURE, BLOOD (ROUTINE X 2)
Culture: NO GROWTH
Culture: NO GROWTH
Special Requests: ADEQUATE

## 2020-01-19 ENCOUNTER — Emergency Department: Payer: Medicare Other

## 2020-01-19 ENCOUNTER — Other Ambulatory Visit: Payer: Self-pay

## 2020-01-19 ENCOUNTER — Inpatient Hospital Stay
Admission: EM | Admit: 2020-01-19 | Discharge: 2020-01-25 | DRG: 638 | Disposition: A | Discharge status: Home Health | Payer: Medicare Other | Attending: Internal Medicine | Admitting: Internal Medicine

## 2020-01-19 DIAGNOSIS — Z7982 Long term (current) use of aspirin: Secondary | ICD-10-CM

## 2020-01-19 DIAGNOSIS — Z90710 Acquired absence of both cervix and uterus: Secondary | ICD-10-CM

## 2020-01-19 DIAGNOSIS — Z803 Family history of malignant neoplasm of breast: Secondary | ICD-10-CM

## 2020-01-19 DIAGNOSIS — Z23 Encounter for immunization: Secondary | ICD-10-CM | POA: Diagnosis present

## 2020-01-19 DIAGNOSIS — A419 Sepsis, unspecified organism: Secondary | ICD-10-CM | POA: Diagnosis present

## 2020-01-19 DIAGNOSIS — E1165 Type 2 diabetes mellitus with hyperglycemia: Secondary | ICD-10-CM | POA: Diagnosis present

## 2020-01-19 DIAGNOSIS — E162 Hypoglycemia, unspecified: Secondary | ICD-10-CM

## 2020-01-19 DIAGNOSIS — E119 Type 2 diabetes mellitus without complications: Secondary | ICD-10-CM | POA: Diagnosis not present

## 2020-01-19 DIAGNOSIS — Z20822 Contact with and (suspected) exposure to covid-19: Secondary | ICD-10-CM | POA: Diagnosis present

## 2020-01-19 DIAGNOSIS — E11649 Type 2 diabetes mellitus with hypoglycemia without coma: Principal | ICD-10-CM | POA: Diagnosis present

## 2020-01-19 DIAGNOSIS — R531 Weakness: Secondary | ICD-10-CM | POA: Diagnosis present

## 2020-01-19 DIAGNOSIS — N179 Acute kidney failure, unspecified: Secondary | ICD-10-CM | POA: Diagnosis present

## 2020-01-19 DIAGNOSIS — Z8249 Family history of ischemic heart disease and other diseases of the circulatory system: Secondary | ICD-10-CM | POA: Diagnosis not present

## 2020-01-19 DIAGNOSIS — K219 Gastro-esophageal reflux disease without esophagitis: Secondary | ICD-10-CM | POA: Diagnosis present

## 2020-01-19 DIAGNOSIS — T68XXXA Hypothermia, initial encounter: Secondary | ICD-10-CM

## 2020-01-19 DIAGNOSIS — I160 Hypertensive urgency: Secondary | ICD-10-CM | POA: Diagnosis present

## 2020-01-19 DIAGNOSIS — I252 Old myocardial infarction: Secondary | ICD-10-CM | POA: Diagnosis not present

## 2020-01-19 DIAGNOSIS — F03A0 Unspecified dementia, mild, without behavioral disturbance, psychotic disturbance, mood disturbance, and anxiety: Secondary | ICD-10-CM | POA: Diagnosis present

## 2020-01-19 DIAGNOSIS — I248 Other forms of acute ischemic heart disease: Secondary | ICD-10-CM | POA: Diagnosis present

## 2020-01-19 DIAGNOSIS — Z8673 Personal history of transient ischemic attack (TIA), and cerebral infarction without residual deficits: Secondary | ICD-10-CM | POA: Diagnosis not present

## 2020-01-19 DIAGNOSIS — Z833 Family history of diabetes mellitus: Secondary | ICD-10-CM

## 2020-01-19 DIAGNOSIS — I959 Hypotension, unspecified: Secondary | ICD-10-CM | POA: Diagnosis present

## 2020-01-19 DIAGNOSIS — I10 Essential (primary) hypertension: Secondary | ICD-10-CM | POA: Diagnosis present

## 2020-01-19 DIAGNOSIS — R778 Other specified abnormalities of plasma proteins: Secondary | ICD-10-CM | POA: Diagnosis not present

## 2020-01-19 DIAGNOSIS — F0280 Dementia in other diseases classified elsewhere without behavioral disturbance: Secondary | ICD-10-CM | POA: Diagnosis present

## 2020-01-19 DIAGNOSIS — G309 Alzheimer's disease, unspecified: Secondary | ICD-10-CM | POA: Diagnosis present

## 2020-01-19 DIAGNOSIS — Z79899 Other long term (current) drug therapy: Secondary | ICD-10-CM | POA: Diagnosis not present

## 2020-01-19 DIAGNOSIS — R680 Hypothermia, not associated with low environmental temperature: Secondary | ICD-10-CM | POA: Diagnosis present

## 2020-01-19 DIAGNOSIS — I2510 Atherosclerotic heart disease of native coronary artery without angina pectoris: Secondary | ICD-10-CM | POA: Diagnosis present

## 2020-01-19 DIAGNOSIS — Z794 Long term (current) use of insulin: Secondary | ICD-10-CM | POA: Diagnosis not present

## 2020-01-19 LAB — URINALYSIS, COMPLETE (UACMP) WITH MICROSCOPIC
Bacteria, UA: NONE SEEN
Bilirubin Urine: NEGATIVE
Glucose, UA: 50 mg/dL — AB
Hgb urine dipstick: NEGATIVE
Ketones, ur: NEGATIVE mg/dL
Leukocytes,Ua: NEGATIVE
Nitrite: NEGATIVE
Protein, ur: >=300 mg/dL — AB
Specific Gravity, Urine: 1.009 (ref 1.005–1.030)
pH: 6 (ref 5.0–8.0)

## 2020-01-19 LAB — BASIC METABOLIC PANEL
Anion gap: 9 (ref 5–15)
BUN: 16 mg/dL (ref 8–23)
CO2: 24 mmol/L (ref 22–32)
Calcium: 8.6 mg/dL — ABNORMAL LOW (ref 8.9–10.3)
Chloride: 105 mmol/L (ref 98–111)
Creatinine, Ser: 0.83 mg/dL (ref 0.44–1.00)
GFR calc Af Amer: >60 mL/min (ref >60)
GFR calc non Af Amer: >60 mL/min (ref >60)
Glucose, Bld: 176 mg/dL — ABNORMAL HIGH (ref 70–99)
Potassium: 4 mmol/L (ref 3.5–5.1)
Sodium: 138 mmol/L (ref 135–145)

## 2020-01-19 LAB — GLUCOSE, CAPILLARY
Glucose-Capillary: 193 mg/dL — ABNORMAL HIGH (ref 70–99)
Glucose-Capillary: 145 mg/dL — ABNORMAL HIGH (ref 70–99)

## 2020-01-19 LAB — CBC WITH DIFFERENTIAL/PLATELET
Abs Immature Granulocytes: 0.09 10*3/uL — ABNORMAL HIGH (ref 0.00–0.07)
Basophils Absolute: 0.1 10*3/uL (ref 0.0–0.1)
Basophils Relative: 0 %
Eosinophils Absolute: 0.1 10*3/uL (ref 0.0–0.5)
Eosinophils Relative: 1 %
HCT: 40.1 % (ref 36.0–46.0)
Hemoglobin: 13.1 g/dL (ref 12.0–15.0)
Immature Granulocytes: 1 %
Lymphocytes Relative: 14 %
Lymphs Abs: 2.3 10*3/uL (ref 0.7–4.0)
MCH: 30.5 pg (ref 26.0–34.0)
MCHC: 32.7 g/dL (ref 30.0–36.0)
MCV: 93.5 fL (ref 80.0–100.0)
Monocytes Absolute: 0.9 10*3/uL (ref 0.1–1.0)
Monocytes Relative: 5 %
Neutro Abs: 13.2 10*3/uL — ABNORMAL HIGH (ref 1.7–7.7)
Neutrophils Relative %: 79 %
Platelets: 296 10*3/uL (ref 150–400)
RBC: 4.29 MIL/uL (ref 3.87–5.11)
RDW: 12.6 % (ref 11.5–15.5)
WBC: 16.6 10*3/uL — ABNORMAL HIGH (ref 4.0–10.5)
nRBC: 0 % (ref 0.0–0.2)

## 2020-01-19 LAB — SARS CORONAVIRUS 2 BY RT PCR (HOSPITAL ORDER, PERFORMED IN ~~LOC~~ HOSPITAL LAB): SARS Coronavirus 2: NEGATIVE

## 2020-01-19 LAB — LACTIC ACID, PLASMA
Lactic Acid, Venous: 4.3 mmol/L (ref 0.5–1.9)
Lactic Acid, Venous: 4.9 mmol/L (ref 0.5–1.9)

## 2020-01-19 LAB — TROPONIN I (HIGH SENSITIVITY)
Troponin I (High Sensitivity): 18 ng/L — ABNORMAL HIGH (ref <18)
Troponin I (High Sensitivity): 46 ng/L — ABNORMAL HIGH (ref <18)

## 2020-01-19 LAB — BRAIN NATRIURETIC PEPTIDE: B Natriuretic Peptide: 73.3 pg/mL (ref 0.0–100.0)

## 2020-01-19 MED ORDER — MEMANTINE HCL 5 MG PO TABS
5.0000 mg | ORAL_TABLET | Freq: Two times a day (BID) | ORAL | Status: DC
  Administered 2020-01-20 – 2020-01-25 (×12): 5 mg via ORAL
  Filled 2020-01-19 (×13): qty 1

## 2020-01-19 MED ORDER — CLONIDINE HCL 0.1 MG PO TABS
0.2000 mg | ORAL_TABLET | Freq: Once | ORAL | Status: AC
  Administered 2020-01-19: 0.2 mg via ORAL
  Filled 2020-01-19: qty 2

## 2020-01-19 MED ORDER — LACTATED RINGERS IV BOLUS (SEPSIS)
1000.0000 mL | Freq: Once | INTRAVENOUS | Status: AC
  Administered 2020-01-19: 1000 mL via INTRAVENOUS

## 2020-01-19 MED ORDER — LOSARTAN POTASSIUM 50 MG PO TABS
50.0000 mg | ORAL_TABLET | Freq: Every day | ORAL | Status: DC
  Administered 2020-01-20 – 2020-01-24 (×6): 50 mg via ORAL
  Filled 2020-01-19 (×8): qty 1

## 2020-01-19 MED ORDER — BRIMONIDINE TARTRATE-TIMOLOL 0.2-0.5 % OP SOLN: 1.0000 [drp] | Freq: Every day | OPHTHALMIC | Status: DC

## 2020-01-19 MED ORDER — HYDRALAZINE HCL 20 MG/ML IJ SOLN
10.0000 mg | Freq: Once | INTRAMUSCULAR | Status: AC
  Administered 2020-01-19: 10 mg via INTRAVENOUS
  Filled 2020-01-19: qty 1

## 2020-01-19 MED ORDER — SODIUM CHLORIDE 0.9 % IV SOLN: 2.0000 g | Freq: Once | INTRAVENOUS | Status: DC

## 2020-01-19 MED ORDER — FLUTICASONE PROPIONATE 50 MCG/ACT NA SUSP
2.0000 | Freq: Every day | NASAL | Status: DC | PRN
  Filled 2020-01-19: qty 16

## 2020-01-19 MED ORDER — LACTATED RINGERS IV BOLUS (SEPSIS)
250.0000 mL | Freq: Once | INTRAVENOUS | Status: AC
  Administered 2020-01-19: 250 mL via INTRAVENOUS

## 2020-01-19 MED ORDER — POLYETHYLENE GLYCOL 3350 17 G PO PACK
17.0000 g | PACK | Freq: Every day | ORAL | Status: DC
  Administered 2020-01-20 – 2020-01-25 (×6): 17 g via ORAL
  Filled 2020-01-19 (×6): qty 1

## 2020-01-19 MED ORDER — DOCUSATE SODIUM 100 MG PO CAPS
100.0000 mg | ORAL_CAPSULE | Freq: Every morning | ORAL | Status: DC
  Administered 2020-01-20 – 2020-01-25 (×6): 100 mg via ORAL
  Filled 2020-01-19 (×6): qty 1

## 2020-01-19 MED ORDER — ACETAMINOPHEN 650 MG RE SUPP: 650.0000 mg | Freq: Four times a day (QID) | RECTAL | Status: DC | PRN

## 2020-01-19 MED ORDER — ISOSORBIDE MONONITRATE ER 30 MG PO TB24
30.0000 mg | ORAL_TABLET | Freq: Every day | ORAL | Status: DC
  Administered 2020-01-20 – 2020-01-25 (×6): 30 mg via ORAL
  Filled 2020-01-19 (×6): qty 1

## 2020-01-19 MED ORDER — SIMETHICONE 80 MG PO CHEW
80.0000 mg | CHEWABLE_TABLET | Freq: Four times a day (QID) | ORAL | Status: DC | PRN
  Filled 2020-01-19: qty 1

## 2020-01-19 MED ORDER — ENSURE ENLIVE PO LIQD
237.0000 mL | Freq: Two times a day (BID) | ORAL | Status: DC
  Administered 2020-01-20 – 2020-01-25 (×10): 237 mL via ORAL

## 2020-01-19 MED ORDER — ADULT MULTIVITAMIN W/MINERALS CH
1.0000 | ORAL_TABLET | ORAL | Status: DC
  Administered 2020-01-20 – 2020-01-25 (×7): 1 via ORAL
  Filled 2020-01-19 (×7): qty 1

## 2020-01-19 MED ORDER — INSULIN ASPART 100 UNIT/ML ~~LOC~~ SOLN: 0.0000 [IU] | Freq: Three times a day (TID) | SUBCUTANEOUS | Status: DC

## 2020-01-19 MED ORDER — HYDRALAZINE HCL 50 MG PO TABS
25.0000 mg | ORAL_TABLET | Freq: Three times a day (TID) | ORAL | Status: DC
  Administered 2020-01-20 – 2020-01-25 (×16): 25 mg via ORAL
  Filled 2020-01-19 (×16): qty 1

## 2020-01-19 MED ORDER — METRONIDAZOLE IN NACL 5-0.79 MG/ML-% IV SOLN
500.0000 mg | Freq: Three times a day (TID) | INTRAVENOUS | Status: DC
  Administered 2020-01-19 – 2020-01-20 (×2): 500 mg via INTRAVENOUS
  Filled 2020-01-19 (×4): qty 100

## 2020-01-19 MED ORDER — INSULIN ASPART 100 UNIT/ML ~~LOC~~ SOLN
0.0000 [IU] | Freq: Every day | SUBCUTANEOUS | Status: DC
  Administered 2020-01-21: 22:00:00 2 [IU] via SUBCUTANEOUS
  Administered 2020-01-22: 21:00:00 4 [IU] via SUBCUTANEOUS
  Administered 2020-01-23: 21:00:00 2 [IU] via SUBCUTANEOUS
  Filled 2020-01-19 (×3): qty 1

## 2020-01-19 MED ORDER — VANCOMYCIN HCL 1750 MG/350ML IV SOLN: 1750.0000 mg | Freq: Once | INTRAVENOUS | Status: DC

## 2020-01-19 MED ORDER — SODIUM CHLORIDE 0.9 % IV SOLN
2.0000 g | Freq: Once | INTRAVENOUS | Status: AC
  Administered 2020-01-19: 2 g via INTRAVENOUS
  Filled 2020-01-19: qty 2

## 2020-01-19 MED ORDER — CITALOPRAM HYDROBROMIDE 20 MG PO TABS
20.0000 mg | ORAL_TABLET | Freq: Every day | ORAL | Status: DC
  Administered 2020-01-20 – 2020-01-25 (×6): 20 mg via ORAL
  Filled 2020-01-19 (×6): qty 1

## 2020-01-19 MED ORDER — VANCOMYCIN HCL IN DEXTROSE 1-5 GM/200ML-% IV SOLN
1000.0000 mg | Freq: Once | INTRAVENOUS | Status: AC
  Administered 2020-01-19: 1000 mg via INTRAVENOUS
  Filled 2020-01-19: qty 200

## 2020-01-19 MED ORDER — DONEPEZIL HCL 5 MG PO TABS
5.0000 mg | ORAL_TABLET | Freq: Every day | ORAL | Status: DC
  Administered 2020-01-20 – 2020-01-24 (×6): 5 mg via ORAL
  Filled 2020-01-19 (×6): qty 1

## 2020-01-19 MED ORDER — VANCOMYCIN HCL 750 MG/150ML IV SOLN
750.0000 mg | Freq: Once | INTRAVENOUS | Status: DC
  Filled 2020-01-19: qty 150

## 2020-01-19 MED ORDER — ACETAMINOPHEN 500 MG PO TABS: 1000.0000 mg | ORAL_TABLET | Freq: Four times a day (QID) | ORAL | Status: DC | PRN

## 2020-01-19 MED ORDER — ONDANSETRON HCL 4 MG PO TABS: 4.0000 mg | ORAL_TABLET | Freq: Four times a day (QID) | ORAL | Status: DC | PRN

## 2020-01-19 MED ORDER — ACETAMINOPHEN 325 MG PO TABS: 650.0000 mg | ORAL_TABLET | Freq: Four times a day (QID) | ORAL | Status: DC | PRN

## 2020-01-19 MED ORDER — SACCHAROMYCES BOULARDII 250 MG PO CAPS
250.0000 mg | ORAL_CAPSULE | Freq: Two times a day (BID) | ORAL | Status: DC
  Administered 2020-01-20 – 2020-01-25 (×10): 250 mg via ORAL
  Filled 2020-01-19 (×13): qty 1

## 2020-01-19 MED ORDER — ASPIRIN EC 81 MG PO TBEC
81.0000 mg | DELAYED_RELEASE_TABLET | Freq: Every day | ORAL | Status: DC
  Administered 2020-01-20 – 2020-01-25 (×6): 81 mg via ORAL
  Filled 2020-01-19 (×6): qty 1

## 2020-01-19 MED ORDER — VANCOMYCIN HCL IN DEXTROSE 1-5 GM/200ML-% IV SOLN: 1000.0000 mg | Freq: Once | INTRAVENOUS | Status: DC

## 2020-01-19 MED ORDER — MAGNESIUM OXIDE 400 (241.3 MG) MG PO TABS
400.0000 mg | ORAL_TABLET | Freq: Every day | ORAL | Status: DC
  Administered 2020-01-20 – 2020-01-25 (×6): 400 mg via ORAL
  Filled 2020-01-19 (×6): qty 1

## 2020-01-19 MED ORDER — SODIUM CHLORIDE 0.9 % IV SOLN: INTRAVENOUS | Status: DC

## 2020-01-19 MED ORDER — ONDANSETRON HCL 4 MG/2ML IJ SOLN: 4.0000 mg | Freq: Four times a day (QID) | INTRAMUSCULAR | Status: DC | PRN

## 2020-01-19 MED ORDER — ENOXAPARIN SODIUM 40 MG/0.4ML ~~LOC~~ SOLN
40.0000 mg | SUBCUTANEOUS | Status: DC
  Administered 2020-01-19 – 2020-01-24 (×6): 40 mg via SUBCUTANEOUS
  Filled 2020-01-19 (×6): qty 0.4

## 2020-01-19 MED ORDER — PRAVASTATIN SODIUM 20 MG PO TABS
40.0000 mg | ORAL_TABLET | Freq: Every day | ORAL | Status: DC
  Administered 2020-01-20 – 2020-01-24 (×5): 40 mg via ORAL
  Filled 2020-01-19 (×5): qty 2

## 2020-01-19 NOTE — Progress Notes (Signed)
CODE SEPSIS - PHARMACY COMMUNICATION  **Broad Spectrum Antibiotics should be administered within 1 hour of Sepsis diagnosis**  Time Code Sepsis Called/Page Received:   6/16 @ 2031   Antibiotics Ordered: Vancomycin , Cefepime   Time of 1st antibiotic administration: Vancomycin 1 gm IV X 1 on 6/16 @ 1831   Additional action taken by pharmacy:   If necessary, Name of Provider/Nurse Contacted:     Maeven Mcdougall D ,PharmD Clinical Pharmacist  01/19/2020  8:58 PM

## 2020-01-19 NOTE — ED Notes (Signed)
Bare hugger removed to tolerate.

## 2020-01-19 NOTE — ED Notes (Signed)
Bair huger placed.

## 2020-01-19 NOTE — ED Notes (Signed)
Pt denies SOB/Cp/N/V/D at this time. Pt denies urinary symptoms. St "I just had chills this morning".

## 2020-01-19 NOTE — H&P (Signed)
History and Physical   Demiya Magno WNW:[id number] DOB: Jul 13, 1941 DOA: 01/19/2020  Referring MD/NP/PA: Dr. Marisa Severin  PCP: Lynnea Ferrier, MD   Outpatient Specialists: None  Patient coming from: Home  Chief Complaint: Hypoglycemia and weakness  HPI: Vicki [name] is a 79 y.o. female with medical history significant of diabetes, GERD, coronary artery disease, history of CVA, chronic sciatica, Alzheimer's dementia, recurrent DKA's and generalized weakness with acute kidney injury who came to the ER secondary to altered mental status and hypoglycemia.  Patient had blood sugar of 40 on arrival.  She was hypothermic as well as septic.  No obvious source of her sepsis.  She had chills in the morning with fever prior to coming in.  She is a poor historian not able to give adequate history.  Patient noted to have fulfill the sepsis criteria except for the source of her infection.  She is currently on Bear hugger and temperature has warmed up.  Blood sugar has also stabilized now.  She takes her insulin but not sure of how much she took at home.  Patient being admitted to the hospital for work-up of sepsis of unknown source as well as hypoglycemia which may be as a result of the sepsis..  ED Course: Temperature 92.8 blood pressure 240/144, pulse 84 respirate of 19 oxygen sat 99% room air.  White count is 16.6 otherwise rest of CBC and chemistry appear to be within normal.  Urinalysis essentially negative.  Troponin is 46 lactic acid 4.9.  Head CT without contrast is negative EKG showed mainly sinus tachycardia.  COVID-19 is negative.  Chest x-ray showed no acute findings.  Patient being admitted with sepsis of unknown source.  Review of Systems: As per HPI otherwise 10 point review of systems negative.    Past Medical History:  Diagnosis Date  . Diabetes mellitus without complication (HCC)   . GERD (gastroesophageal reflux disease)   . Hypertension   . Lesion of sciatic nerve, right side    . MI (myocardial infarction) (HCC)   . Stroke [facility])     Past Surgical History:  Procedure Laterality Date  . ABDOMINAL HYSTERECTOMY    . BREAST BIOPSY Right    neg  . BREAST SURGERY    . LEFT HEART CATH AND CORONARY ANGIOGRAPHY N/A 10/01/2016   Procedure: Left Heart Cath and Coronary Angiography and possible PCI;  Surgeon: Alwyn Pea, MD;  Location: [facility] INVASIVE CV LAB;  Service: Cardiovascular;  Laterality: N/A;     reports that she has never smoked. She has never used smokeless tobacco. She reports that she does not drink alcohol and does not use drugs.  No Known Allergies  Family History  Problem Relation Age of Onset  . Heart disease Mother   . Diabetes Mellitus II Father   . Hypertension Sister   . Diabetes Mellitus II Sister   . Breast cancer Sister 2  . Hypertension Brother   . Diabetes Mellitus II Brother      Prior to Admission medications   Medication Sig Start Date End Date Taking? Authorizing Provider  acetaminophen (TYLENOL) 500 MG tablet Take 1,000-1,500 mg by mouth every 6 (six) hours as needed for mild pain or headache.   Yes [provider]  aspirin EC 81 MG EC tablet Take 1 tablet (81 mg total) by mouth daily. 03/26/16  Yes Katharina Caper, MD  brimonidine-timolol (COMBIGAN) 0.2-0.5 % ophthalmic solution Place 1 drop into both eyes daily.   Yes [provider]  citalopram (CELEXA) 20 MG tablet Take 20 mg by mouth daily.   Yes [provider]  Docusate Sodium (DSS) 100 MG CAPS Take 100 mg by mouth in the morning and at bedtime.    Yes [provider]  donepezil (ARICEPT) 5 MG tablet Take 5 mg by mouth at bedtime.   Yes [provider]  Ensure Max Protein (ENSURE MAX PROTEIN) LIQD Take 330 mLs (11 oz total) by mouth 2 (two) times daily between meals. 10/18/19  Yes Lavina Hamman, MD  fluticasone (FLONASE) 50 MCG/ACT nasal spray Place 2 sprays into both nostrils daily.   Yes [provider]   hydrALAZINE (APRESOLINE) 50 MG tablet Take 0.5 tablets (25 mg total) by mouth 3 (three) times daily. 10/27/19  Yes Fritzi Mandes, MD  insulin glargine (LANTUS) 100 UNIT/ML injection Inject 0.3 mLs (30 Units total) into the skin at bedtime. 10/27/19  Yes Fritzi Mandes, MD  insulin lispro (HUMALOG) 100 UNIT/ML injection Inject into the skin. INJECT 4 UNITS SQ THREE TIMES A DAY BEFORE MEALS AND ALSO ACCORDING TO SLIDING SCALE 10 mL 3   Yes [provider]  isosorbide mononitrate (IMDUR) 30 MG 24 hr tablet Take 1 tablet (30 mg total) by mouth daily. 03/26/16  Yes Theodoro Grist, MD  losartan (COZAAR) 50 MG tablet Take 50 mg by mouth at bedtime.   Yes [provider]  lovastatin (MEVACOR) 40 MG tablet Take 40 mg by mouth. TAKE ONE TABLET BY MOUTH EVERY DAY WITH DINNER   Yes [provider]  Magnesium Oxide 400 (240 Mg) MG TABS Take 1 tablet by mouth daily. 01/10/20  Yes [provider]  memantine (NAMENDA) 5 MG tablet Take 5 mg by mouth 2 (two) times daily.    Yes [provider]  Multiple Vitamins-Minerals (CENTRUM ADULTS) TABS Take 1 tablet by mouth every morning.    Yes [provider]  Netarsudil Dimesylate (RHOPRESSA) 0.02 % SOLN Apply to eye.   Yes [provider]  polyethylene glycol (MIRALAX / GLYCOLAX) packet Take 17 g by mouth daily. 05/03/15  Yes Earleen Newport, MD  saccharomyces boulardii (FLORASTOR) 250 MG capsule Take 250 mg by mouth 2 (two) times daily.   Yes [provider]  simethicone (MYLICON) 80 MG chewable tablet Chew 1 tablet (80 mg total) by mouth 4 (four) times daily as needed for flatulence. 10/18/19  Yes Lavina Hamman, MD    Physical Exam: Vitals:   01/19/20 1834 01/19/20 1838 01/19/20 1848 01/19/20 1917  BP:  (!) 218/85 (!) 218/85   Pulse: 79 74    Resp: 17 19    Temp:    97.7 F (36.5 C)  TempSrc:    Oral  SpO2: 100% 100%    Weight:      Height:          Constitutional: Confused, acutely ill  looking, covered in bear hugger Vitals:   01/19/20 1834 01/19/20 1838 01/19/20 1848 01/19/20 1917  BP:  (!) 218/85 (!) 218/85   Pulse: 79 74    Resp: 17 19    Temp:    97.7 F (36.5 C)  TempSrc:    Oral  SpO2: 100% 100%    Weight:      Height:       Eyes: PERRL, lids and conjunctivae normal ENMT: Mucous membranes are dry. Posterior pharynx clear of any exudate or lesions.Normal dentition.  Neck: normal, supple, no masses, no thyromegaly Respiratory: clear to auscultation bilaterally, no  wheezing, no crackles. Normal respiratory effort. No accessory muscle use.  Cardiovascular: Regular rate and rhythm, no murmurs / rubs / gallops. No extremity edema. 2+ pedal pulses. No carotid bruits.  Abdomen: no tenderness, no masses palpated. No hepatosplenomegaly. Bowel sounds positive.  Musculoskeletal: no clubbing / cyanosis. No joint deformity upper and lower extremities. Good ROM, no contractures. Normal muscle tone.  Skin: Dry coarse skin no rashes, lesions, ulcers. No induration Neurologic: CN 2-12 grossly intact. Sensation intact, DTR normal. Strength 5/5 in all 4.  Psychiatric: Mild confusion, withdrawn and depressed   Labs on Admission: I have personally reviewed following labs and imaging studies  CBC: Recent Labs  Lab 01/19/20 1612  WBC 16.6*  NEUTROABS 13.2*  HGB 13.1  HCT 40.1  MCV 93.5  PLT 630   Basic Metabolic Panel: Recent Labs  Lab 01/19/20 1642  NA 138  K 4.0  CL 105  CO2 24  GLUCOSE 176*  BUN 16  CREATININE 0.83  CALCIUM 8.6*   GFR: Estimated Creatinine Clearance: 51.7 mL/min (by C-G formula based on SCr of 0.83 mg/dL). Liver Function Tests: No results for input(s): AST, ALT, ALKPHOS, BILITOT, PROT, ALBUMIN in the last 168 hours. No results for input(s): LIPASE, AMYLASE in the last 168 hours. No results for input(s): AMMONIA in the last 168 hours. Coagulation Profile: No results for input(s): INR, PROTIME in the last 168 hours. Cardiac Enzymes: No  results for input(s): CKTOTAL, CKMB, CKMBINDEX, TROPONINI in the last 168 hours. BNP (last 3 results) No results for input(s): PROBNP in the last 8760 hours. HbA1C: No results for input(s): HGBA1C in the last 72 hours. CBG: Recent Labs  Lab 01/19/20 1604  GLUCAP 193*   Lipid Profile: No results for input(s): CHOL, HDL, LDLCALC, TRIG, CHOLHDL, LDLDIRECT in the last 72 hours. Thyroid Function Tests: No results for input(s): TSH, T4TOTAL, FREET4, T3FREE, THYROIDAB in the last 72 hours. Anemia Panel: No results for input(s): VITAMINB12, FOLATE, FERRITIN, TIBC, IRON, RETICCTPCT in the last 72 hours. Urine analysis:    Component Value Date/Time   COLORURINE YELLOW (A) 01/19/2020 1823   APPEARANCEUR CLEAR (A) 01/19/2020 1823   APPEARANCEUR Hazy 04/04/2012 2050   LABSPEC 1.009 01/19/2020 1823   LABSPEC 1.017 04/04/2012 2050   PHURINE 6.0 01/19/2020 1823   GLUCOSEU 50 (A) 01/19/2020 1823   GLUCOSEU >=500 04/04/2012 2050   HGBUR NEGATIVE 01/19/2020 1823   [place] NEGATIVE 01/19/2020 1823   BILIRUBINUR Negative 04/04/2012 2050   KETONESUR NEGATIVE 01/19/2020 1823   PROTEINUR >=300 (A) 01/19/2020 1823   NITRITE NEGATIVE 01/19/2020 1823   LEUKOCYTESUR NEGATIVE 01/19/2020 1823   LEUKOCYTESUR Negative 04/04/2012 2050   Sepsis Labs: '@LABRCNTIP'$ (procalcitonin:4,lacticidven:4) ) Recent Results (from the past 240 hour(s))  SARS Coronavirus 2 by RT PCR (hospital order, performed in [place] hospital lab) Nasopharyngeal Nasopharyngeal Swab     Status: None   Collection Time: 01/19/20  5:49 PM   Specimen: Nasopharyngeal Swab  Result Value Ref Range Status   SARS Coronavirus 2 NEGATIVE NEGATIVE Final    Comment: (NOTE) SARS-CoV-2 target nucleic acids are NOT DETECTED.  The SARS-CoV-2 RNA is generally detectable in upper and lower respiratory specimens during the acute phase of infection. The lowest concentration of SARS-CoV-2 viral copies this assay can detect is 250 copies / mL. A  negative result does not preclude SARS-CoV-2 infection and should not be used as the sole basis for treatment or other patient management decisions.  A negative result may occur with improper specimen collection / handling, submission  of specimen other than nasopharyngeal swab, presence of viral mutation(s) within the areas targeted by this assay, and inadequate number of viral copies (<250 copies / mL). A negative result must be combined with clinical observations, patient history, and epidemiological information.  Fact Sheet for Patients:   StrictlyIdeas.no  Fact Sheet for Healthcare Providers: BankingDealers.co.za  This test is not yet approved or  cleared by the [place] FDA and has been authorized for detection and/or diagnosis of SARS-CoV-2 by FDA under an Emergency Use Authorization (EUA).  This EUA will remain in effect (meaning this test can be used) for the duration of the COVID-19 declaration under Section 564(b)(1) of the Act, 21 U.S.C. section 360bbb-3(b)(1), unless the authorization is terminated or revoked sooner.  Performed at [facility], [address], [place], [place] [address]      Radiological Exams on Admission: CT Head Wo Contrast  Result Date: 01/19/2020 CLINICAL DATA:  Altered mental status, hypoglycemia and diaphoresis. EXAM: CT HEAD WITHOUT CONTRAST TECHNIQUE: Contiguous axial images were obtained from the base of the skull through the vertex without intravenous contrast. COMPARISON:  10/09/2019 FINDINGS: Brain: The brainstem, cerebellum, cerebral peduncles, thalami, basal ganglia, basilar cisterns, and ventricular system appear within normal limits. Periventricular white matter and corona radiata hypodensities favor chronic ischemic microvascular white matter disease. No intracranial hemorrhage, mass lesion, or acute CVA. Vascular: There is atherosclerotic calcification of the cavernous carotid  arteries bilaterally. Skull: Unremarkable Sinuses/Orbits: Mild chronic right ethmoid sinusitis. Other: No supplemental non-categorized findings. IMPRESSION: 1. No acute intracranial findings. 2. Periventricular white matter and corona radiata hypodensities favor chronic ischemic microvascular white matter disease. 3. Mild chronic right ethmoid sinusitis. Electronically Signed   By: Van Clines M.D.   On: 01/19/2020 18:25   DG Chest Port 1 View  Result Date: 01/19/2020 CLINICAL DATA:  Concern for sepsis EXAM: PORTABLE CHEST 1 VIEW COMPARISON:  Radiograph 10/23/2019, CT 12/04/2017 FINDINGS: Low volumes and atelectatic changes in the lung bases. No focal consolidation or convincing features of pulmonary edema. No pneumothorax or visible effusion. The aorta is calcified. The remaining cardiomediastinal contours are unremarkable. Few subacute appearing left-sided contiguous rib fractures involving the sixth through ninth left ribs posterolaterally with some callus formation, new since comparison in March of 2021. No other acute or suspicious osseous abnormalities. Degenerative changes in the spine and shoulders. Telemetry leads overlie the chest. IMPRESSION: 1. Low lung volumes with atelectatic changes in the lung bases. 2. Few subacute appearing left-sided rib fractures involving the sixth through ninth left ribs posterolaterally with callus formation, new since comparison in March of 2021. Electronically Signed   By: Lovena Le M.D.   On: 01/19/2020 17:18    EKG: Independently reviewed.  It shows sinus rhythm with nonspecific ST changes.  Assessment/Plan Principal Problem:   Sepsis ([place]) Active Problems:   Elevated troponin I level   HTN (hypertension), malignant   Diabetes (HCC)   GERD (gastroesophageal reflux disease)   Mild dementia (HCC)   Hypoglycemia   Generalized weakness     #1 sepsis: Unknown source.  Patient has met sepsis criteria.  We will admit the patient.  Aggressively  hydrate.  Empiric antibiotics for sepsis.  Follow blood cultures.  Monitor response closely.  #2 diabetes: Initiate sliding scale insulin with home regimen.  She is hypoglycemic so no long-acting insulin.  Monitor his CBG closely on telemetry stabilizes.  We may have to use D5 if needed.  #3 hypoglycemia: May be inadvertent insulin intake.  Monitor closely as above.  #  4 GERD: Continue with PPIs  #5 hypertensive urgency: Patient is hypertensive but now blood pressure over 240/1 40s.  IV labetalol and hydralazine given.  Blood pressure has improved now.  Trending towards normal.  Resume home regimen.  #6 dementia: Stable no agitation.  Continue monitoring.  #7 elevated troponin: Probably due to sepsis as well as hypertensive urgency.  So far troponin looks flat.  Continue monitoring.  Patient has history of coronary artery disease with cath in 2018.  Patient has mild coronary artery disease at the time.  #8 generalized weakness: PT and OT consult.  Disposition planning   DVT prophylaxis: Lovenox Code Status: Full code Family Communication: No family at bedside Disposition Plan: To be determined Consults called: None Admission status: Inpatient  Severity of Illness: The appropriate patient status for this patient is INPATIENT. Inpatient status is judged to be reasonable and necessary in order to provide the required intensity of service to ensure the patient's safety. The patient's presenting symptoms, physical exam findings, and initial radiographic and laboratory data in the context of their chronic comorbidities is felt to place them at high risk for further clinical deterioration. Furthermore, it is not anticipated that the patient will be medically stable for discharge from the hospital within 2 midnights of admission. The following factors support the patient status of inpatient.   " The patient's presenting symptoms include weakness and hypoglycemia. " The worrisome physical exam  findings include mild confusion and weakness. " The initial radiographic and laboratory data are worrisome because of initial glucose of 40. " The chronic co-morbidities include diabetes hypertension dementia.   * I certify that at the point of admission it is my clinical judgment that the patient will require inpatient hospital care spanning beyond 2 midnights from the point of admission due to high intensity of service, high risk for further deterioration and high frequency of surveillance required.Barbette Merino MD [facility] Pager [phone]  If 7PM-7AM, please contact night-coverage [email or web address] Password [facility]  01/19/2020, 8:37 PM

## 2020-01-19 NOTE — ED Notes (Signed)
purewick placed

## 2020-01-19 NOTE — ED Provider Notes (Signed)
[facility] Emergency Department Provider Note ____________________________________________   First MD Initiated Contact with Patient 01/19/20 1622     (approximate)  I have reviewed the triage vital signs and the nursing notes.   HISTORY  Chief Complaint Hypoglycemia  Level 5 caveat: History present illness limited due to dementia  HPI Vicki [name] is a 79 y.o. female with PMH as noted below who presents from home due to hypoglycemia and altered mental status.  When EMS arrived her blood glucose was 40, and she received D10 in route.  The patient reports that she had some chills this morning but otherwise has been feeling well and she denies other acute symptoms at this time.  Past Medical History:  Diagnosis Date  . Diabetes mellitus without complication (HCC)   . GERD (gastroesophageal reflux disease)   . Hypertension   . Lesion of sciatic nerve, right side   . MI (myocardial infarction) (HCC)   . Stroke [facility])     Patient Active Problem List   Diagnosis Date Noted  . Alzheimer's dementia without behavioral disturbance (HCC)   . DKA (diabetic ketoacidoses) (HCC) 10/14/2019  . Diabetic ketoacidosis without coma associated with diabetes mellitus due to underlying condition (HCC)   . Dehydration 10/06/2019  . Generalized weakness 10/06/2019  . Acute renal failure (HCC) 10/06/2019  . Hypertension   . Hypoglycemia 12/10/2017  . 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 NSTEMI (non-ST elevated myocardial infarction) (HCC) 10/01/2016  . 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 Diabetes (HCC) 09/29/2016  . GERD (gastroesophageal reflux disease) 09/29/2016  . Nausea and vomiting 03/24/2016  . Diffuse abdominal pain 03/24/2016  . Hypokalemia 03/24/2016  . Chest pain 03/24/2016  . Elevated troponin I level 09/26/2015  . 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HCC) 09/25/2015    Past Surgical  History:  Procedure Laterality Date  . ABDOMINAL HYSTERECTOMY    . BREAST BIOPSY Right    neg  . BREAST SURGERY    . LEFT HEART CATH AND CORONARY ANGIOGRAPHY N/A 10/01/2016   Procedure: Left Heart Cath and Coronary Angiography and possible PCI;  Surgeon: Alwyn Pea, MD;  Location: [facility] INVASIVE CV LAB;  Service: Cardiovascular;  Laterality: N/A;    Prior to Admission medications   Medication Sig Start Date End Date Taking? Authorizing Provider  acetaminophen (TYLENOL) 500 MG tablet Take 1,000-1,500 mg by mouth every 6 (six) hours as needed for mild pain or headache.   Yes [provider]  aspirin EC 81 MG EC tablet Take 1 tablet (81 mg total) by mouth daily. 03/26/16  Yes Katharina Caper, MD  brimonidine-timolol (COMBIGAN) 0.2-0.5 % ophthalmic solution Place 1 drop into both eyes daily.   Yes [provider]  citalopram (CELEXA) 20 MG tablet Take 20 mg by mouth daily.   Yes [provider]  Docusate Sodium (DSS) 100 MG CAPS Take 100 mg by mouth in the morning and at bedtime.    Yes [provider]  donepezil (ARICEPT) 5 MG tablet Take 5 mg by mouth at bedtime.   Yes [provider]  Ensure Max Protein (ENSURE MAX PROTEIN) LIQD Take 330 mLs (11 oz total) by mouth 2 (two) times daily between meals. 10/18/19  Yes Rolly Salter, MD  fluticasone (FLONASE) 50 MCG/ACT nasal spray Place 2 sprays into both nostrils daily.   Yes [provider]  hydrALAZINE (APRESOLINE) 50 MG tablet Take 0.5 tablets (25 mg total)  by mouth 3 (three) times daily. 10/27/19  Yes Enedina Finner, MD  insulin glargine (LANTUS) 100 UNIT/ML injection Inject 0.3 mLs (30 Units total) into the skin at bedtime. 10/27/19  Yes Enedina Finner, MD  insulin lispro (HUMALOG) 100 UNIT/ML injection Inject into the skin. INJECT 4 UNITS SQ THREE TIMES A DAY BEFORE MEALS AND ALSO ACCORDING TO SLIDING SCALE 10 mL 3   Yes [provider]  isosorbide mononitrate (IMDUR) 30 MG 24 hr  tablet Take 1 tablet (30 mg total) by mouth daily. 03/26/16  Yes Katharina Caper, MD  losartan (COZAAR) 50 MG tablet Take 50 mg by mouth at bedtime.   Yes [provider]  lovastatin (MEVACOR) 40 MG tablet Take 40 mg by mouth. TAKE ONE TABLET BY MOUTH EVERY DAY WITH DINNER   Yes [provider]  Magnesium Oxide 400 (240 Mg) MG TABS Take 1 tablet by mouth daily. 01/10/20  Yes [provider]  memantine (NAMENDA) 5 MG tablet Take 5 mg by mouth 2 (two) times daily.    Yes [provider]  Multiple Vitamins-Minerals (CENTRUM ADULTS) TABS Take 1 tablet by mouth every morning.    Yes [provider]  Netarsudil Dimesylate (RHOPRESSA) 0.02 % SOLN Apply to eye.   Yes [provider]  polyethylene glycol (MIRALAX / GLYCOLAX) packet Take 17 g by mouth daily. 05/03/15  Yes [name], MD  saccharomyces boulardii (FLORASTOR) 250 MG capsule Take 250 mg by mouth 2 (two) times daily.   Yes [provider]  simethicone (MYLICON) 80 MG chewable tablet Chew 1 tablet (80 mg total) by mouth 4 (four) times daily as needed for flatulence. 10/18/19  Yes Rolly Salter, MD    Allergies Patient has no known allergies.  Family History  Problem Relation Age of Onset  . Heart disease Mother   . Diabetes Mellitus II Father   . Hypertension Sister   . Diabetes Mellitus II Sister   . Breast cancer Sister 19  . Hypertension Brother   . Diabetes Mellitus II Brother     Social History Social History   Tobacco Use  . Smoking status: Never Smoker  . Smokeless tobacco: Never Used  Vaping Use  . Vaping Use: Never used  Substance Use Topics  . Alcohol use: No  . Drug use: No    Review of Systems Level 5 caveat: Review of systems limited due to dementia Constitutional: Positive for chills. Cardiovascular: Denies chest pain. Respiratory: Denies shortness of breath. Gastrointestinal: No vomiting. Musculoskeletal: Negative for back  pain. Neurological: Negative for headache.   ____________________________________________   PHYSICAL EXAM:  VITAL SIGNS: ED Triage Vitals  Enc Vitals Group     BP 01/19/20 1551 (!) 229/97     Pulse Rate 01/19/20 1551 84     Resp 01/19/20 1558 18     Temp 01/19/20 1619 (!) 92.8 F (33.8 C)     Temp Source 01/19/20 1619 Rectal     SpO2 01/19/20 1551 100 %     Weight 01/19/20 1558 155 lb 12.8 oz (70.7 kg)     Height 01/19/20 1558 5\' 7"  (1.702 m)     Head Circumference --      Peak Flow --      Pain Score --      Pain Loc --      Pain Edu? --      Excl. in GC? --     Constitutional: Alert and oriented.  Relatively well appearing and in  no acute distress. Eyes: Conjunctivae are normal.  EOMI.  PERRLA. Head: Atraumatic. Nose: No congestion/rhinnorhea. Mouth/Throat: Mucous membranes are moist.   Neck: Normal range of motion.  Cardiovascular: Normal rate, regular rhythm. Grossly normal heart sounds.  Good peripheral circulation. Respiratory: Normal respiratory effort.  No retractions. Lungs CTAB. Gastrointestinal: Soft and nontender. No distention.  Genitourinary: No flank tenderness. Musculoskeletal: No lower extremity edema.  Extremities warm and well perfused.  Neurologic:  Normal speech and language.  Motor intact in all extremities. Skin:  Skin is warm and dry. No rash noted. Psychiatric: Calm and cooperative.  ____________________________________________   LABS (all labs ordered are listed, but only abnormal results are displayed)  Labs Reviewed  GLUCOSE, CAPILLARY - Abnormal; Notable for the following components:      Result Value   Glucose-Capillary 193 (*)    All other components within normal limits  CBC WITH DIFFERENTIAL/PLATELET - Abnormal; Notable for the following components:   WBC 16.6 (*)    Neutro Abs 13.2 (*)    Abs Immature Granulocytes 0.09 (*)    All other components within normal limits  URINALYSIS, COMPLETE (UACMP) WITH MICROSCOPIC - Abnormal;  Notable for the following components:   Color, Urine YELLOW (*)    APPearance CLEAR (*)    Glucose, UA 50 (*)    Protein, ur >=300 (*)    All other components within normal limits  BASIC METABOLIC PANEL - Abnormal; Notable for the following components:   Glucose, Bld 176 (*)    Calcium 8.6 (*)    All other components within normal limits  LACTIC ACID, PLASMA - Abnormal; Notable for the following components:   Lactic Acid, Venous 4.3 (*)    All other components within normal limits  LACTIC ACID, PLASMA - Abnormal; Notable for the following components:   Lactic Acid, Venous 4.9 (*)    All other components within normal limits  TROPONIN I (HIGH SENSITIVITY) - Abnormal; Notable for the following components:   Troponin I (High Sensitivity) 18 (*)    All other components within normal limits  TROPONIN I (HIGH SENSITIVITY) - Abnormal; Notable for the following components:   Troponin I (High Sensitivity) 46 (*)    All other components within normal limits  SARS CORONAVIRUS 2 BY RT PCR (HOSPITAL ORDER, PERFORMED IN [place])  CULTURE, BLOOD (ROUTINE X 2)  CULTURE, BLOOD (ROUTINE X 2)  BRAIN NATRIURETIC PEPTIDE  PROTIME-INR   ____________________________________________  EKG  ED ECG REPORT I, Dionne Bucy, the attending physician, personally viewed and interpreted this ECG.  Date: 01/19/2020 EKG Time: 1750 Rate: 70 Rhythm: normal sinus rhythm QRS Axis: normal Intervals: Prolonged QT ST/T Wave abnormalities: Nonspecific ST abnormalities inferiorly Narrative Interpretation: Nonspecific abnormalities with no evidence of acute ischemia   ____________________________________________  RADIOLOGY  CXR: Atelectasis in the lung bases  ____________________________________________   PROCEDURES  Procedure(s) performed: No  Procedures  Critical Care performed: Yes  CRITICAL CARE Performed by: Dionne Bucy   Total critical care time: 30  minutes  Critical care time was exclusive of separately billable procedures and treating other patients.  Critical care was necessary to treat or prevent imminent or life-threatening deterioration.  Critical care was time spent personally by me on the following activities: development of treatment plan with patient and/or surrogate as well as nursing, discussions with consultants, evaluation of patient's response to treatment, examination of patient, obtaining history from patient or surrogate, ordering and performing treatments and interventions, ordering and review of laboratory studies, ordering and  review of radiographic studies, pulse oximetry and re-evaluation of patient's condition. ____________________________________________   INITIAL IMPRESSION / ASSESSMENT AND PLAN / ED COURSE  Pertinent labs & imaging results that were available during my care of the patient were reviewed by me and considered in my medical decision making (see chart for details).  79 year old female with PMH as noted above presents with altered mental status and an episode of hypoglycemia which appear to have resolved, however she is hypothermic and hypertensive.  The patient is mildly demented but is able to give some history.  She states that she just had some chills today but denies other acute symptoms.  I reviewed the past medical records in [place].  The patient was most recently admitted in March of this year for DKA and acute renal failure.  On exam currently, she is hypertensive and hypothermic with otherwise normal vital signs.  She is alert and oriented and appears relatively comfortable.  Neurologic exam is nonfocal.  The abdomen is soft and nontender.  The remainder of the exam is as described above.  I suspect that the altered mental status was likely due to the hypoglycemia, however the hypothermia is concerning for infection/sepsis.  Differential also includes hepatic encephalopathy, other acute CNS  etiology (although the patient has no focal neurologic findings), dehydration, other metabolic cause.  We will obtain lab work-up, chest x-ray, CT head, put the patient on a bear hugger, and reassess.  I anticipate admission.  ----------------------------------------- 8:31 PM on 01/19/2020 -----------------------------------------  The blood pressure improved with hydralazine, but then went back up. I have ordered a dose of clonidine. The patient's lactic acid and WBC count are elevated, however she is afebrile and both urinalysis and chest x-ray are negative for acute findings. She has no focal symptoms and there is no obvious source of infection. After getting the initial lactic result, I ordered IV fluids and broad-spectrum antibiotics. I discussed the case with hospitalist for admission. ____________________________________________   FINAL CLINICAL IMPRESSION(S) / ED DIAGNOSES  Final diagnoses:  Hypoglycemia  Hypertension, unspecified type  Hypothermia, initial encounter      NEW MEDICATIONS STARTED DURING THIS VISIT:  New Prescriptions   No medications on file     Note:  This document was prepared using Dragon voice recognition software and may include unintentional dictation errors.   Arta Silence, MD 01/19/20 2032

## 2020-01-19 NOTE — ED Notes (Signed)
Date and time results received: 01/19/20 1723 (use smartphrase ".now" to insert current time)  Test: lactic acid Critical Value: 4.3  Name of Provider Notified: siadecki  Orders Received? Or Actions Taken?: Actions Taken: siadecki md informed

## 2020-01-19 NOTE — ED Notes (Signed)
Pt to CT

## 2020-01-19 NOTE — Progress Notes (Signed)
PHARMACY -  BRIEF ANTIBIOTIC NOTE   Pharmacy has received consult(s) for vancomycin and cefepime from an ED provider.  The patient's profile has been reviewed for ht/wt/allergies/indication/available labs.    One time order(s) placed for vanc 1 g + cefepime 2 g  Further antibiotics/pharmacy consults should be ordered by admitting physician if indicated.                       Thank you,  Pricilla Riffle, PharmD 01/19/2020  5:28 PM

## 2020-01-19 NOTE — ED Notes (Signed)
Pt able to ambulated to toilet with a steady gait independently.

## 2020-01-19 NOTE — ED Notes (Signed)
Dr. Marisa Severin informed of critical trop and lactic

## 2020-01-19 NOTE — ED Triage Notes (Addendum)
Pt from home via [facility]. Per EMS, FD at scene reported AMS, CBG 34 given \\30g  glucose resulting in a CBG 40.  EMS administered 150 mg D10 en-route resulting CBG 286.  Pt diaphoretic upon arrival. C/o "chills" started this morning.  Pt presents with a hx of dementia per EMS.  EMS VS 250/136

## 2020-01-19 NOTE — ED Notes (Signed)
Pt provided with phone by this RN to contact family.

## 2020-01-19 NOTE — Progress Notes (Signed)
PHARMACY -  BRIEF ANTIBIOTIC NOTE   Pharmacy has received consult(s) for Vancomycin, Cefepime  from an ED provider.  The patient's profile has been reviewed for ht/wt/allergies/indication/available labs.    One time order(s) placed for Vancomycin 1750 mg IV X 1 and cefepime 2 gm IV X 1.   Further antibiotics/pharmacy consults should be ordered by admitting physician if indicated.                       Thank you, Vicki [name] D 01/19/2020  8:57 PM

## 2020-01-19 NOTE — ED Notes (Signed)
Vicki [name] in lab informed that light green and grey tubes are being sent down with white patient label timed, dated and initialed by this RN

## 2020-01-20 ENCOUNTER — Encounter: Payer: Self-pay | Admitting: Internal Medicine

## 2020-01-20 DIAGNOSIS — E162 Hypoglycemia, unspecified: Secondary | ICD-10-CM

## 2020-01-20 LAB — CBC
HCT: 33.9 % — ABNORMAL LOW (ref 36.0–46.0)
Hemoglobin: 11.5 g/dL — ABNORMAL LOW (ref 12.0–15.0)
MCH: 30 pg (ref 26.0–34.0)
MCHC: 33.9 g/dL (ref 30.0–36.0)
MCV: 88.5 fL (ref 80.0–100.0)
Platelets: 254 10*3/uL (ref 150–400)
RBC: 3.83 MIL/uL — ABNORMAL LOW (ref 3.87–5.11)
RDW: 12.6 % (ref 11.5–15.5)
WBC: 7.9 10*3/uL (ref 4.0–10.5)
nRBC: 0 % (ref 0.0–0.2)

## 2020-01-20 LAB — COMPREHENSIVE METABOLIC PANEL
ALT: 34 U/L (ref 0–44)
AST: 37 U/L (ref 15–41)
Albumin: 3.2 g/dL — ABNORMAL LOW (ref 3.5–5.0)
Alkaline Phosphatase: 60 U/L (ref 38–126)
Anion gap: 8 (ref 5–15)
BUN: 12 mg/dL (ref 8–23)
CO2: 27 mmol/L (ref 22–32)
Calcium: 8.7 mg/dL — ABNORMAL LOW (ref 8.9–10.3)
Chloride: 107 mmol/L (ref 98–111)
Creatinine, Ser: 0.8 mg/dL (ref 0.44–1.00)
GFR calc Af Amer: >60 mL/min (ref >60)
GFR calc non Af Amer: >60 mL/min (ref >60)
Glucose, Bld: 82 mg/dL (ref 70–99)
Potassium: 3.6 mmol/L (ref 3.5–5.1)
Sodium: 142 mmol/L (ref 135–145)
Total Bilirubin: 0.8 mg/dL (ref 0.3–1.2)
Total Protein: 6 g/dL — ABNORMAL LOW (ref 6.5–8.1)

## 2020-01-20 LAB — GLUCOSE, CAPILLARY
Glucose-Capillary: 67 mg/dL — ABNORMAL LOW (ref 70–99)
Glucose-Capillary: 80 mg/dL (ref 70–99)
Glucose-Capillary: 91 mg/dL (ref 70–99)
Glucose-Capillary: 419 mg/dL — ABNORMAL HIGH (ref 70–99)
Glucose-Capillary: 456 mg/dL — ABNORMAL HIGH (ref 70–99)
Glucose-Capillary: 403 mg/dL — ABNORMAL HIGH (ref 70–99)
Glucose-Capillary: 165 mg/dL — ABNORMAL HIGH (ref 70–99)
Glucose-Capillary: 131 mg/dL — ABNORMAL HIGH (ref 70–99)

## 2020-01-20 LAB — PROCALCITONIN: Procalcitonin: <0.1 ng/mL

## 2020-01-20 LAB — MAGNESIUM: Magnesium: 1.6 mg/dL — ABNORMAL LOW (ref 1.7–2.4)

## 2020-01-20 LAB — MRSA PCR SCREENING: MRSA by PCR: NEGATIVE

## 2020-01-20 LAB — CORTISOL-AM, BLOOD: Cortisol - AM: 5.9 ug/dL — ABNORMAL LOW (ref 6.7–22.6)

## 2020-01-20 LAB — PROTIME-INR
INR: 1 (ref 0.8–1.2)
Prothrombin Time: 12.9 seconds (ref 11.4–15.2)

## 2020-01-20 MED ORDER — HYDRALAZINE HCL 20 MG/ML IJ SOLN
10.0000 mg | INTRAMUSCULAR | Status: DC | PRN
  Administered 2020-01-20 – 2020-01-21 (×2): 10 mg via INTRAVENOUS
  Filled 2020-01-20 (×2): qty 1

## 2020-01-20 MED ORDER — INSULIN ASPART 100 UNIT/ML ~~LOC~~ SOLN
7.0000 [IU] | Freq: Once | SUBCUTANEOUS | Status: AC
  Administered 2020-01-20: 7 [IU] via SUBCUTANEOUS
  Filled 2020-01-20: qty 1

## 2020-01-20 MED ORDER — INSULIN GLARGINE 100 UNIT/ML ~~LOC~~ SOLN
25.0000 [IU] | Freq: Every day | SUBCUTANEOUS | Status: DC
  Filled 2020-01-20: qty 0.25

## 2020-01-20 MED ORDER — INSULIN ASPART 100 UNIT/ML ~~LOC~~ SOLN
3.0000 [IU] | Freq: Three times a day (TID) | SUBCUTANEOUS | Status: DC
  Administered 2020-01-20 – 2020-01-23 (×9): 3 [IU] via SUBCUTANEOUS
  Filled 2020-01-20 (×8): qty 1

## 2020-01-20 MED ORDER — CHLORHEXIDINE GLUCONATE CLOTH 2 % EX PADS
6.0000 | MEDICATED_PAD | Freq: Once | CUTANEOUS | Status: AC
  Administered 2020-01-20: 6 via TOPICAL
  Filled 2020-01-20: qty 6

## 2020-01-20 MED ORDER — TIMOLOL MALEATE 0.5 % OP SOLN
1.0000 [drp] | Freq: Every day | OPHTHALMIC | Status: DC
  Administered 2020-01-20 – 2020-01-25 (×6): 1 [drp] via OPHTHALMIC
  Filled 2020-01-20: qty 5

## 2020-01-20 MED ORDER — CHLORHEXIDINE GLUCONATE CLOTH 2 % EX PADS
6.0000 | MEDICATED_PAD | Freq: Every day | CUTANEOUS | Status: DC
  Administered 2020-01-20 – 2020-01-25 (×5): 6 via TOPICAL

## 2020-01-20 MED ORDER — INSULIN GLARGINE 100 UNIT/ML ~~LOC~~ SOLN
25.0000 [IU] | Freq: Every day | SUBCUTANEOUS | Status: DC
  Administered 2020-01-20 – 2020-01-21 (×2): 25 [IU] via SUBCUTANEOUS
  Filled 2020-01-20 (×3): qty 0.25

## 2020-01-20 MED ORDER — BRIMONIDINE TARTRATE 0.2 % OP SOLN
1.0000 [drp] | Freq: Every day | OPHTHALMIC | Status: DC
  Administered 2020-01-20 – 2020-01-25 (×6): 1 [drp] via OPHTHALMIC
  Filled 2020-01-20: qty 5

## 2020-01-20 MED ORDER — INSULIN ASPART 100 UNIT/ML ~~LOC~~ SOLN: 5.0000 [IU] | Freq: Once | SUBCUTANEOUS | Status: DC

## 2020-01-20 MED ORDER — INSULIN ASPART 100 UNIT/ML ~~LOC~~ SOLN: 0.0000 [IU] | Freq: Three times a day (TID) | SUBCUTANEOUS | Status: DC

## 2020-01-20 MED ORDER — INSULIN ASPART 100 UNIT/ML ~~LOC~~ SOLN: 10.0000 [IU] | Freq: Once | SUBCUTANEOUS | Status: DC

## 2020-01-20 MED ORDER — INSULIN ASPART 100 UNIT/ML ~~LOC~~ SOLN
0.0000 [IU] | Freq: Three times a day (TID) | SUBCUTANEOUS | Status: DC
  Administered 2020-01-20 (×2): 15 [IU] via SUBCUTANEOUS
  Administered 2020-01-21: 13:00:00 11 [IU] via SUBCUTANEOUS
  Administered 2020-01-21: 2 [IU] via SUBCUTANEOUS
  Administered 2020-01-21: 18:00:00 15 [IU] via SUBCUTANEOUS
  Administered 2020-01-22 (×3): 5 [IU] via SUBCUTANEOUS
  Administered 2020-01-23: 15 [IU] via SUBCUTANEOUS
  Administered 2020-01-23 (×2): 8 [IU] via SUBCUTANEOUS
  Administered 2020-01-24: 10:00:00 5 [IU] via SUBCUTANEOUS
  Administered 2020-01-24: 11 [IU] via SUBCUTANEOUS
  Administered 2020-01-24: 5 [IU] via SUBCUTANEOUS
  Administered 2020-01-25: 09:00:00 8 [IU] via SUBCUTANEOUS
  Administered 2020-01-25: 15 [IU] via SUBCUTANEOUS
  Filled 2020-01-20 (×15): qty 1

## 2020-01-20 NOTE — Progress Notes (Signed)
Dr. Georgeann Oppenheim notified of elevated blood glucose.  Orders received. MD does not want to lab draw to confirm value.

## 2020-01-20 NOTE — Progress Notes (Signed)
PROGRESS NOTE    Vicki [name]  LGX:[id number] DOB: [birth year] DOA: 01/19/2020 PCP: Lynnea Ferrier, MD  Brief Narrative:  HPI: Vicki [name] is a 79 y.o. female with medical history significant of diabetes, GERD, coronary artery disease, history of CVA, chronic sciatica, Alzheimer's dementia, recurrent DKA's and generalized weakness with acute kidney injury who came to the ER secondary to altered mental status and hypoglycemia.  Patient had blood sugar of 40 on arrival.  She was hypothermic as well as septic.  No obvious source of her sepsis.  She had chills in the morning with fever prior to coming in.  She is a poor historian not able to give adequate history.  Patient noted to have fulfill the sepsis criteria except for the source of her infection.  She is currently on Bear hugger and temperature has warmed up.  Blood sugar has also stabilized now.  She takes her insulin but not sure of how much she took at home.  Patient being admitted to the hospital for work-up of sepsis of unknown source as well as hypoglycemia which may be as a result of the sepsis.  6/17: Patient seen and examined.  Sugars have gone from hypoglycemic to hyperglycemic.  No source of infection.  Negative procalcitonin.  Antibiotics discontinued.  Patient stable no distress.  Hypothermia resolved.   Assessment & Plan:   Principal Problem:   Sepsis (HCC) Active Problems:   Elevated troponin I level   HTN (hypertension), malignant   Diabetes (HCC)   GERD (gastroesophageal reflux disease)   Mild dementia (HCC)   Hypoglycemia   Generalized weakness  Hypoglycemia Unclear etiology.  Patient does not positive how much insulin she took Sugars have gone from low to high Plan: Hypoglycemia protocol Lantus 25 units daily NovoLog 3 units 3 times daily with meals Moderate sliding scale Nightly coverage Carb controlled diet Diabetes coordinator consult  Sepsis, ruled out No clinical indicators of  sepsis Hypothermia and hypotension likely driven by hyperglycemia Stop all antibiotics  Diabetes mellitus, insulin-dependent Insulin regimen as above  Hypertensive urgency Blood pressure control is improved Continue current regimen  GERD Protonix  Dementia Stable, no agitation  Elevated troponin Likely demand ischemia secondary to hypertensive urgency and hyperglycemia No suspicion for ACS  Generalized weakness PT and OT consults     DVT prophylaxis: Lovenox  code Status: Full code Family Communication: None today  disposition Plan: Status is: Inpatient  Remains inpatient appropriate because:Inpatient level of care appropriate due to severity of illness   Dispo: The patient is from: Home              Anticipated d/c is to: Home              Anticipated d/c date is: 1 day              Patient currently is not medically stable to d/c.         Consultants:   none  Procedures:   none  Antimicrobials:   none   Subjective: Seen and examined.  In NAD.  No complaints  Objective: Vitals:   01/20/20 0900 01/20/20 1100 01/20/20 1200 01/20/20 1300  BP: (!) 160/51 (!) 188/112 (!) 170/75 (!) 165/69  Pulse: 75 71 66 71  Resp: 20     Temp: 98.3 F (36.8 C)     TempSrc: Oral     SpO2: 99% 99% 99% 100%  Weight:      Height:  Intake/Output Summary (Last 24 hours) at 01/20/2020 1436 Last data filed at 01/20/2020 1000 Gross per 24 hour  Intake 775.35 ml  Output 900 ml  Net -124.65 ml   Filed Weights   01/19/20 1558 01/20/20 0230  Weight: 70.7 kg 66.9 kg    Examination:  General exam: Appears calm and comfortable  Respiratory system: Clear to auscultation. Respiratory effort normal. Cardiovascular system: S1 & S2 heard, RRR. No JVD, murmurs, rubs, gallops or clicks. No pedal edema. Gastrointestinal system: Abdomen is nondistended, soft and nontender. No organomegaly or masses felt. Normal bowel sounds heard. Central nervous system: Alert  and oriented. No focal neurological deficits. Extremities: Symmetric 5 x 5 power. Skin: No rashes, lesions or ulcers Psychiatry: Judgement and insight appear normal. Mood & affect appropriate.     Data Reviewed: I have personally reviewed following labs and imaging studies  CBC: Recent Labs  Lab 01/19/20 1612 01/20/20 0437  WBC 16.6* 7.9  NEUTROABS 13.2*  --   HGB 13.1 11.5*  HCT 40.1 33.9*  MCV 93.5 88.5  PLT 296 542   Basic Metabolic Panel: Recent Labs  Lab 01/19/20 1642 01/20/20 0437  NA 138 142  K 4.0 3.6  CL 105 107  CO2 24 27  GLUCOSE 176* 82  BUN 16 12  CREATININE 0.83 0.80  CALCIUM 8.6* 8.7*  MG  --  1.6*   GFR: Estimated Creatinine Clearance: 53.6 mL/min (by C-G formula based on SCr of 0.8 mg/dL). Liver Function Tests: Recent Labs  Lab 01/20/20 0437  AST 37  ALT 34  ALKPHOS 60  BILITOT 0.8  PROT 6.0*  ALBUMIN 3.2*   No results for input(s): LIPASE, AMYLASE in the last 168 hours. No results for input(s): AMMONIA in the last 168 hours. Coagulation Profile: Recent Labs  Lab 01/20/20 0437  INR 1.0   Cardiac Enzymes: No results for input(s): CKTOTAL, CKMB, CKMBINDEX, TROPONINI in the last 168 hours. BNP (last 3 results) No results for input(s): PROBNP in the last 8760 hours. HbA1C: No results for input(s): HGBA1C in the last 72 hours. CBG: Recent Labs  Lab 01/20/20 0234 01/20/20 0550 01/20/20 0833 01/20/20 1157 01/20/20 1230  GLUCAP 67* 80 91 419* 456*   Lipid Profile: No results for input(s): CHOL, HDL, LDLCALC, TRIG, CHOLHDL, LDLDIRECT in the last 72 hours. Thyroid Function Tests: No results for input(s): TSH, T4TOTAL, FREET4, T3FREE, THYROIDAB in the last 72 hours. Anemia Panel: No results for input(s): VITAMINB12, FOLATE, FERRITIN, TIBC, IRON, RETICCTPCT in the last 72 hours. Sepsis Labs: Recent Labs  Lab 01/19/20 1642 01/19/20 1930 01/20/20 0437  PROCALCITON  --   --  <0.10  LATICACIDVEN 4.3* 4.9*  --     Recent Results  (from the past 240 hour(s))  Culture, blood (Routine x 2)     Status: None (Preliminary result)   Collection Time: 01/19/20  4:42 PM   Specimen: BLOOD  Result Value Ref Range Status   Specimen Description BLOOD BLOOD LEFT HAND  Final   Special Requests   Final    BOTTLES DRAWN AEROBIC AND ANAEROBIC Blood Culture results may not be optimal due to an inadequate volume of blood received in culture bottles   Culture   Final    NO GROWTH < 24 HOURS Performed at [facility], [address] [place], [place] [address]    Report Status PENDING  Incomplete  Culture, blood (Routine x 2)     Status: None (Preliminary result)   Collection Time: 01/19/20  4:42 PM  Specimen: BLOOD  Result Value Ref Range Status   Specimen Description BLOOD RIGHT ANTECUBITAL  Final   Special Requests   Final    BOTTLES DRAWN AEROBIC AND ANAEROBIC Blood Culture results may not be optimal due to an inadequate volume of blood received in culture bottles   Culture   Final    NO GROWTH < 24 HOURS Performed at [facility], [address] [place], [place] [address]    Report Status PENDING  Incomplete  SARS Coronavirus 2 by RT PCR (hospital order, performed in [facility] hospital lab) Nasopharyngeal Nasopharyngeal Swab     Status: None   Collection Time: 01/19/20  5:49 PM   Specimen: Nasopharyngeal Swab  Result Value Ref Range Status   SARS Coronavirus 2 NEGATIVE NEGATIVE Final    Comment: (NOTE) SARS-CoV-2 target nucleic acids are NOT DETECTED.  The SARS-CoV-2 RNA is generally detectable in upper and lower respiratory specimens during the acute phase of infection. The lowest concentration of SARS-CoV-2 viral copies this assay can detect is 250 copies / mL. A negative result does not preclude SARS-CoV-2 infection and should not be used as the sole basis for treatment or other patient management decisions.  A negative result may occur with improper specimen collection / handling, submission  of specimen other than nasopharyngeal swab, presence of viral mutation(s) within the areas targeted by this assay, and inadequate number of viral copies (<250 copies / mL). A negative result must be combined with clinical observations, patient history, and epidemiological information.  Fact Sheet for Patients:   BoilerBrush.com.cy  Fact Sheet for Healthcare Providers: https://pope.com/  This test is not yet approved or  cleared by the [place] FDA and has been authorized for detection and/or diagnosis of SARS-CoV-2 by FDA under an Emergency Use Authorization (EUA).  This EUA will remain in effect (meaning this test can be used) for the duration of the COVID-19 declaration under Section 564(b)(1) of the Act, 21 U.S.C. section 360bbb-3(b)(1), unless the authorization is terminated or revoked sooner.  Performed at [facility], [address] [place], [place] [address]   MRSA PCR Screening     Status: None   Collection Time: 01/20/20  2:48 AM   Specimen: Nasal Mucosa; Nasopharyngeal  Result Value Ref Range Status   MRSA by PCR NEGATIVE NEGATIVE Final    Comment:        The GeneXpert MRSA Assay (FDA approved for NASAL specimens only), is one component of a comprehensive MRSA colonization surveillance program. It is not intended to diagnose MRSA infection nor to guide or monitor treatment for MRSA infections. Performed at [facility], [address], [place], [place] [address]          Radiology Studies: CT Head Wo Contrast  Result Date: 01/19/2020 CLINICAL DATA:  Altered mental status, hypoglycemia and diaphoresis. EXAM: CT HEAD WITHOUT CONTRAST TECHNIQUE: Contiguous axial images were obtained from the base of the skull through the vertex without intravenous contrast. COMPARISON:  10/09/2019 FINDINGS: Brain: The brainstem, cerebellum, cerebral peduncles, thalami, basal ganglia, basilar cisterns, and  ventricular system appear within normal limits. Periventricular white matter and corona radiata hypodensities favor chronic ischemic microvascular white matter disease. No intracranial hemorrhage, mass lesion, or acute CVA. Vascular: There is atherosclerotic calcification of the cavernous carotid arteries bilaterally. Skull: Unremarkable Sinuses/Orbits: Mild chronic right ethmoid sinusitis. Other: No supplemental non-categorized findings. IMPRESSION: 1. No acute intracranial findings. 2. Periventricular white matter and corona radiata hypodensities favor chronic ischemic microvascular white matter disease.  3. Mild chronic right ethmoid sinusitis. Electronically Signed   By: Gaylyn Rong M.D.   On: 01/19/2020 18:25   DG Chest Port 1 View  Result Date: 01/19/2020 CLINICAL DATA:  Concern for sepsis EXAM: PORTABLE CHEST 1 VIEW COMPARISON:  Radiograph 10/23/2019, CT 12/04/2017 FINDINGS: Low volumes and atelectatic changes in the lung bases. No focal consolidation or convincing features of pulmonary edema. No pneumothorax or visible effusion. The aorta is calcified. The remaining cardiomediastinal contours are unremarkable. Few subacute appearing left-sided contiguous rib fractures involving the sixth through ninth left ribs posterolaterally with some callus formation, new since comparison in March of 2021. No other acute or suspicious osseous abnormalities. Degenerative changes in the spine and shoulders. Telemetry leads overlie the chest. IMPRESSION: 1. Low lung volumes with atelectatic changes in the lung bases. 2. Few subacute appearing left-sided rib fractures involving the sixth through ninth left ribs posterolaterally with callus formation, new since comparison in March of 2021. Electronically Signed   By: Kreg Shropshire M.D.   On: 01/19/2020 17:18        Scheduled Meds: . aspirin EC  81 mg Oral Daily  . brimonidine  1 drop Both Eyes Daily   And  . timolol  1 drop Both Eyes Daily  .  Chlorhexidine Gluconate Cloth  6 each Topical Daily  . citalopram  20 mg Oral Daily  . docusate sodium  100 mg Oral q morning - 10a  . donepezil  5 mg Oral QHS  . enoxaparin (LOVENOX) injection  40 mg Subcutaneous Q24H  . feeding supplement (ENSURE ENLIVE)  237 mL Oral BID BM  . hydrALAZINE  25 mg Oral TID  . insulin aspart  0-15 Units Subcutaneous TID WC  . insulin aspart  0-5 Units Subcutaneous QHS  . insulin aspart  3 Units Subcutaneous TID WC  . insulin glargine  25 Units Subcutaneous QHS  . isosorbide mononitrate  30 mg Oral Daily  . losartan  50 mg Oral QHS  . magnesium oxide  400 mg Oral Daily  . memantine  5 mg Oral BID  . multivitamin with minerals  1 tablet Oral BH-q7a  . polyethylene glycol  17 g Oral Daily  . pravastatin  40 mg Oral q1800  . saccharomyces boulardii  250 mg Oral BID   Continuous Infusions:   LOS: 1 day    Time spent: 35 minutes    Tresa Moore, MD [facility] Pager [phone]  If 7PM-7AM, please contact night-coverage 01/20/2020, 2:36 PM

## 2020-01-20 NOTE — Progress Notes (Addendum)
Inpatient Diabetes Program Recommendations  AACE/ADA: New Consensus Statement on Inpatient Glycemic Control   Target Ranges:  Prepandial:   less than 140 mg/dL      Peak postprandial:   less than 180 mg/dL (1-2 hours)      Critically ill patients:  140 - 180 mg/dL   Results for Vicki [name], Vicki [name] (MRN [id number]) as of 01/20/2020 09:00  Ref. Range 01/19/2020 16:04 01/19/2020 22:15 01/20/2020 02:34 01/20/2020 05:50 01/20/2020 08:33  Glucose-Capillary Latest Ref Range: 70 - 99 mg/dL 017 (H) 510 (H) 67 (L) 80 91  Results for Vicki [name], Vicki [name] (MRN [id number]) as of 01/20/2020 09:00  Ref. Range 10/07/2019 02:58  Hemoglobin A1C Latest Ref Range: 4.8 - 5.6 % 12.6 (H)   Review of Glycemic Control  Diabetes history: DM1  Outpatient Diabetes medications: Lantus 32 units QHS, Humalog per correction scale TID with meals Current orders for Inpatient glycemic control: Novolog 0-9 units TID with meals, Novolog 0-5 units QHS  Inpatient Diabetes Program Recommendations:   Insulinl:  Please consider ordering Lantus 25 units QHS and Novolog 3 units TID with meals for meal coverage if patient eats at least 50% of meals.  NOTE: Per chart, patient brought in via EMS with altered mental status and initial glucose of 34 mg/dl.  Patient sees Dr. Tedd Sias (Endocrinogist) and was last seen 4/30/21and per Dr. Pricilla Handler note "Diabetes remains uncontrolled. She has some dementia and struggles with medication compliance. However, changing her insulin regimen with each hospitalization only compounds this problem. Today I stressed the need for compliance with the insulin regimen I have provided.   I strongly recommend that her insulin regimen NOT be changed. Do not prescribe vials, as she prefers pens. She would benefit only from repeated counseling about how to take her insulin regimen as follows.   Take basal insulin (currently LANTUS vial) 32 units nightly.  Check blood glucose and have something to eat, every 3-4 hours. Take  Humalog insulin before eating either meals or snacks as follows: NONE if your sugar is less than 70  Take 2 units of Humalog if sugar is 70-100 Take 4 units of Humalog if sugar is 100-150 Take 6 units of Humalog if sugar is 151-200 Take 8 units of Humalog if sugar is 201-250 Take 10 units of Humalog if sugar is 251-300 Take 12 units of Humalog if sugar is 301-350"   Addendum 01/20/20@10 :45-Talked with patient regarding DM control and insulin regimen. Patient notes that she sees Dr. Tedd Sias for DM management and that is taking insulin as Dr. Tedd Sias instructed. Patient states that she is taking Lantus 32 units QHS and Humalog per correction scale TID with meals. Patient states that she has her insulin regimen written out for her (provided by Dr. Tedd Sias) on when and exactly what to take for her DM. Patient states that she knows what happen this time to cause her hypoglycemia; she states that she took her Humalog and never ate lunch because she had misplaced her debit card and she was trying to find it. She was up looking around and she started to feel weak and knew her glucose was dropping so she sat down but she could not find any hard candy near her chair. Next thing she knew EMS was there at the house (her cousin she lives with had called 911).  Patient reports that she is very independent but admits that she has dementia. Patient states she tries to do as much for herself as she can. Asked patient if she  could allow her cousin to start watching her take her insulin to be sure she is taking the correct amount and also encouraged patient to take her Humalog when she is right about to eat.  Patient states she hates to bother her cousin but she is willing to let her cousin watch her with her insulin dosages. Patient also notes that her nephew takes her to her appointments and takes her to run errands when needed. Patient very pleasant and able to verbalize which insulins she takes and how much. She also states that  she follows the written instructions that Dr. Gabriel Carina has provided to her regarding DM medications.  Patient verbalized understanding of information discussed and states she has no questions at this time related to DM.  Thanks, Barnie Alderman, RN, MSN, CDE Diabetes Coordinator Inpatient Diabetes Program [phone] (Team Pager from 8am to 5pm)

## 2020-01-20 NOTE — TOC Initial Note (Signed)
Transition of Care [facility]) - Initial/Assessment Note    Patient Details  Name: Tamu Golz MRN: [id number] Date of Birth: 01-03-1941  [facility] CM/SW Contact:    Candie Chroman, LCSW Phone Number: 01/20/2020, 4:16 PM  Clinical Narrative:  Readmission prevention screen complete. CSW met with patient. No supports at bedside. CSW introduced role and explained that discharge planning would be discussed. Patient lives home with her cousin. PCP is Ramonita Lab, MD. She uses the bus to get to appts but has to schedule about a month in advance. Pharmacy is [facility]. No issues obtaining medications. No home health prior to admission. She has a walker and cane at home. At discharge, her sister should be able to take her home. Sister works here at the hospital. No further concerns. CSW encouraged patient to contact CSW as needed. CSW will continue to follow patient for support and facilitate return home when stable.               Expected Discharge Plan: Home/Self Care Barriers to Discharge: Continued Medical Work up   Patient Goals and CMS Choice        Expected Discharge Plan and Services Expected Discharge Plan: Home/Self Care       Living arrangements for the past 2 months: Single Family Home                                      Prior Living Arrangements/Services Living arrangements for the past 2 months: Single Family Home Lives with:: Relatives (Cousin) Patient language and need for interpreter reviewed:: Yes Do you feel safe going back to the place where you live?: Yes      Need for Family Participation in Patient Care: Yes (Comment) Care giver support system in place?: Yes (comment) Current home services: DME Criminal Activity/Legal Involvement Pertinent to Current Situation/Hospitalization: No - Comment as needed  Activities of Daily Living Home Assistive Devices/Equipment: None ADL Screening (condition at time of admission) Patient's  cognitive ability adequate to safely complete daily activities?: No Is the patient deaf or have difficulty hearing?: No Does the patient have difficulty seeing, even when wearing glasses/contacts?: No Does the patient have difficulty concentrating, remembering, or making decisions?: Yes Patient able to express need for assistance with ADLs?: No Does the patient have difficulty dressing or bathing?: No Independently performs ADLs?: Yes (appropriate for developmental age) Does the patient have difficulty walking or climbing stairs?: No Weakness of Legs: None Weakness of Arms/Hands: None  Permission Sought/Granted                  Emotional Assessment Appearance:: Appears stated age Attitude/Demeanor/Rapport: Engaged, Gracious Affect (typically observed): Accepting, Appropriate, Calm, Pleasant Orientation: : Oriented to Self, Oriented to Place, Oriented to  Time, Oriented to Situation Alcohol / Substance Use: Not Applicable Psych Involvement: No (comment)  Admission diagnosis:  Hypoglycemia [E16.2] Hypothermia, initial encounter [T68.XXXA] Sepsis ([place]) [A41.9] Hypertension, unspecified type [I10] Patient Active Problem List   Diagnosis Date Noted  . Sepsis ([place]) 01/19/2020  . Alzheimer's dementia without behavioral disturbance ([place])   . DKA (diabetic ketoacidoses) ([place]) 10/14/2019  . Diabetic ketoacidosis without coma associated with diabetes mellitus due to underlying condition ([place])   . Dehydration 10/06/2019  . Generalized weakness 10/06/2019  . Acute renal failure ([place]) 10/06/2019  . Hypertension   . Hypoglycemia 12/10/2017  . Mild dementia ([place]) 08/14/2017  .  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 NSTEMI (non-ST elevated myocardial infarction) ([place]) 10/01/2016  . 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 Diabetes ([place]) 09/29/2016  . GERD (gastroesophageal reflux disease) 09/29/2016  . Nausea and vomiting 03/24/2016  .  Diffuse abdominal pain 03/24/2016  . Hypokalemia 03/24/2016  . Chest pain 03/24/2016  . Elevated troponin I level 09/26/2015  . 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[place]) 09/25/2015   PCP:  Adin Hector, MD Pharmacy:   [place], [place] [address] Phone: [phone] Fax: [phone]     Social Determinants of Health (SDOH) Interventions    Readmission Risk Interventions Readmission Risk Prevention Plan 01/20/2020 10/15/2019  Transportation Screening Complete Complete  PCP or Specialist Appt within 3-5 Days Complete Complete  HRI or [place] - Complete  Social Work Consult for [place] Planning/Counseling Complete -  Palliative Care Screening Not Applicable -  Medication Review Press photographer) Complete Complete  Some recent data might be hidden

## 2020-01-21 LAB — GLUCOSE, CAPILLARY
Glucose-Capillary: 145 mg/dL — ABNORMAL HIGH (ref 70–99)
Glucose-Capillary: 325 mg/dL — ABNORMAL HIGH (ref 70–99)
Glucose-Capillary: 399 mg/dL — ABNORMAL HIGH (ref 70–99)
Glucose-Capillary: 236 mg/dL — ABNORMAL HIGH (ref 70–99)

## 2020-01-21 LAB — HEMOGLOBIN A1C
Hgb A1c MFr Bld: 11.5 % — ABNORMAL HIGH (ref 4.8–5.6)
Mean Plasma Glucose: 283 mg/dL

## 2020-01-21 NOTE — Care Management Important Message (Signed)
Important Message  Patient Details  Name: Vicki [name] MRN: [id number] Date of Birth: 01-Jul-1941   Medicare Important Message Given:  N/A - LOS <3 / Initial given by admissions     [name] A Ottilie Wigglesworth 01/21/2020, 8:47 AM

## 2020-01-21 NOTE — Evaluation (Signed)
Physical Therapy Evaluation Patient Details Name: Vicki [name] MRN: [id number] DOB: 06/18/1941 Today's Date: 01/21/2020   History of Present Illness  Pt is an 79 y.o. female presenting to hospital 6/16 with hypoglycemia (blood glucose 40) and AMS,  Pt admitted with sepsis, hypoglycemia, and hypertensive urgency.  PMH includes DM, htn, R sided lesion of sciatic nerve, stroke, Alzheimer's dementia, DKA, NSTEMI, h/o breast surgery.  Clinical Impression  Prior to hospital admission, pt was independent with functional mobility; uses cane when walking outside or in community; lives with her cousin (who pt reports also has dementia).  Pt reports difficulty remembering things herself d/t having dementia.  Currently pt is SBA with transfers and CGA with ambulation 120 feet no AD (pt appearing with decreased stance time R LE during ambulation and anticipate pt would benefit from use of SPC--pt reports having SPC at home already).  Pt reporting no pain during session.  BP 146/64 at rest beginning of session and 153/60 end of session at rest in recliner.  Pt would benefit from skilled PT to address noted impairments and functional limitations (see below for any additional details).  Upon hospital discharge, pt would benefit from [place].    Follow Up Recommendations Home health PT;Supervision - Intermittent    Equipment Recommendations  Kasandra Knudsen (pt reports having cane at home already)    Recommendations for Other Services OT consult     Precautions / Restrictions Precautions Precautions: Fall Restrictions Weight Bearing Restrictions: No      Mobility  Bed Mobility             General bed mobility comments: Per OT pt independent with bed mobility earlier today (deferred during PT session d/t pt sitting edge of bed eating lunch beginning of session and sat in recliner end of session)  Transfers Overall transfer level: Needs assistance Equipment used: None Transfers: Sit to/from Stand Sit to  Stand: Supervision         General transfer comment: steady safe transfer standing from bed and sitting in recliner  Ambulation/Gait Ambulation/Gait assistance: Min guard Gait Distance (Feet): 120 Feet Assistive device: None   Gait velocity: decreased   General Gait Details: decreased stance time R LE (pt reporting no pain though) causing decreased L LE step length; no loss of balance noted  Stairs            Wheelchair Mobility    Modified Rankin (Stroke Patients Only)       Balance Overall balance assessment: Needs assistance Sitting-balance support: No upper extremity supported;Feet supported Sitting balance-Leahy Scale: Normal Sitting balance - Comments: steady sitting reaching outside BOS   Standing balance support: No upper extremity supported;During functional activity Standing balance-Leahy Scale: Good Standing balance comment: no loss of balance noted during ambulation or functional activities during sessions activities                             Pertinent Vitals/Pain Pain Assessment: No/denies pain  Vitals (HR and O2 on room air) stable and WFL throughout treatment session.    Home Living Family/patient expects to be discharged to:: Private residence Living Arrangements: Other relatives (Stay at cousin's house) Available Help at Discharge: Family;Available PRN/intermittently (Sister (works at [employer])) Type of Home: House Home Access: Stairs to enter Entrance Stairs-Rails: Psychiatric nurse of Steps: 3 Home Layout: One level Home Equipment: [place] - single point;Shower seat;Grab bars - tub/shower;Hand held Tourist information centre manager - 2 wheels  Prior Function Level of Independence: Independent with assistive device(s);Needs assistance         Comments: Uses cane when walking outside or in community; uses RW when R hip bothers her.  Nephew assists with groceries and appointments.  Pt reports 1 fall on porch (slipped on ice)  landing on R hip (has had R hip pain issues since).     Hand Dominance   Dominant Hand: Right    Extremity/Trunk Assessment   Upper Extremity Assessment Upper Extremity Assessment: Generalized weakness    Lower Extremity Assessment Lower Extremity Assessment: Generalized weakness    Cervical / Trunk Assessment Cervical / Trunk Assessment: Normal  Communication   Communication: No difficulties  Cognition Arousal/Alertness: Awake/alert Behavior During Therapy: WFL for tasks assessed/performed Overall Cognitive Status: No family/caregiver present to determine baseline cognitive functioning                                 General Comments: Oriented to person, place, and general situation      General Comments General comments (skin integrity, edema, etc.): Pt eating lunch (sitting edge of bed) upon PT arrival.  Pt reporting vision being blurry d/t not receiving her eye drops today yet (nurse notified).    Exercises    Assessment/Plan    PT Assessment Patient needs continued PT services  PT Problem List Decreased strength;Decreased mobility;Decreased knowledge of use of DME       PT Treatment Interventions DME instruction;Gait training;Stair training;Functional mobility training;Therapeutic activities;Therapeutic exercise;Balance training;Patient/family education    PT Goals (Current goals can be found in the Care Plan section)  Acute Rehab PT Goals Patient Stated Goal: To return home  PT Goal Formulation: With patient Time For Goal Achievement: 02/04/20 Potential to Achieve Goals: Good    Frequency Min 2X/week   Barriers to discharge        Co-evaluation               AM-PAC PT "6 Clicks" Mobility  Outcome Measure Help needed turning from your back to your side while in a flat bed without using bedrails?: None Help needed moving from lying on your back to sitting on the side of a flat bed without using bedrails?: None Help needed moving  to and from a bed to a chair (including a wheelchair)?: A Little Help needed standing up from a chair using your arms (e.g., wheelchair or bedside chair)?: A Little Help needed to walk in hospital room?: A Little Help needed climbing 3-5 steps with a railing? : A Little 6 Click Score: 20    End of Session Equipment Utilized During Treatment: Gait belt Activity Tolerance: Patient tolerated treatment well Patient left: in chair;with call bell/phone within reach;with chair alarm set Nurse Communication: Mobility status;Precautions;Other (comment) (pt's BP during/end of session) PT Visit Diagnosis: Other abnormalities of gait and mobility (R26.89);Muscle weakness (generalized) (M62.81);History of falling (Z91.81)    Time: 1330-1400 PT Time Calculation (min) (ACUTE ONLY): 30 min   Charges:   PT Evaluation $PT Eval Low Complexity: 1 Low PT Treatments $Therapeutic Activity: 8-22 mins       Hendricks Limes, PT 01/21/20, 2:30 PM

## 2020-01-21 NOTE — TOC Progression Note (Signed)
Transition of Care [facility]) - Progression Note    Patient Details  Name: Vicki [name] MRN: [id number] Date of Birth: 1941/06/18  Transition of Care [facility]) CM/SW [place], LCSW Phone Number: 01/21/2020, 11:17 AM  Clinical Narrative:    CSW met with patient and OT this morning, discussed options with patient and she wants to go to assisted living facility. I am starting the process now. Patient will need insurance authorization before discharging    Expected Discharge Plan: Home/Self Care Barriers to Discharge: Continued Medical Work up  Expected Discharge Plan and Services Expected Discharge Plan: Home/Self Care       Living arrangements for the past 2 months: Single Family Home                                       Social Determinants of Health (SDOH) Interventions    Readmission Risk Interventions Readmission Risk Prevention Plan 01/20/2020 10/15/2019  Transportation Screening Complete Complete  PCP or Specialist Appt within 3-5 Days Complete Complete  HRI or [place] - Complete  Social Work Consult for [place] Planning/Counseling Complete -  Palliative Care Screening Not Applicable -  Medication Review Press photographer) Complete Complete  Some recent data might be hidden

## 2020-01-21 NOTE — Progress Notes (Signed)
PROGRESS NOTE    Vicki [name]  WFU:[id number] DOB: 10-Dec-1940 DOA: 01/19/2020 PCP: Lynnea Ferrier, MD  Brief Narrative:  HPI: Vicki [name] is a 79 y.o. female with medical history significant of diabetes, GERD, coronary artery disease, history of CVA, chronic sciatica, Alzheimer's dementia, recurrent DKA's and generalized weakness with acute kidney injury who came to the ER secondary to altered mental status and hypoglycemia.  Patient had blood sugar of 40 on arrival.  She was hypothermic as well as septic.  No obvious source of her sepsis.  She had chills in the morning with fever prior to coming in.  She is a poor historian not able to give adequate history.  Patient noted to have fulfill the sepsis criteria except for the source of her infection.  She is currently on Bear hugger and temperature has warmed up.  Blood sugar has also stabilized now.  She takes her insulin but not sure of how much she took at home.  Patient being admitted to the hospital for work-up of sepsis of unknown source as well as hypoglycemia which may be as a result of the sepsis.  6/17: Patient seen and examined.  Sugars have gone from hypoglycemic to hyperglycemic.  No source of infection.  Negative procalcitonin.  Antibiotics discontinued.  Patient stable no distress.  Hypothermia resolved.  6/18: Patient seen and examined.  Sugars better controlled over interval.  Remained stable off antibiotics.  No distress.  Per case management note patient electing to disposition at assisted living facility.  They are working on process.  Will need insurance authorization before discharging.   Assessment & Plan:   Principal Problem:   Sepsis (HCC) Active Problems:   Elevated troponin I level   HTN (hypertension), malignant   Diabetes (HCC)   GERD (gastroesophageal reflux disease)   Mild dementia (HCC)   Hypoglycemia   Generalized weakness  Hypoglycemia Unclear etiology.  Patient does not positive how much  insulin she took Sugars have gone from low to high Better controlled over interval Plan: Hypoglycemia protocol Lantus 25 units daily NovoLog 3 units 3 times daily with meals Moderate sliding scale Nightly coverage Carb controlled diet Diabetes coordinator consult  Sepsis, ruled out No clinical indicators of sepsis Hypothermia and hypotension likely driven by hyperglycemia Stop all antibiotics  Diabetes mellitus, insulin-dependent Insulin regimen as above  Hypertensive urgency Blood pressure control is improved Continue current regimen  GERD Protonix  Dementia Stable, no agitation  Elevated troponin Likely demand ischemia secondary to hypertensive urgency and hyperglycemia No suspicion for ACS  Generalized weakness PT and OT consults: rec home health     DVT prophylaxis: Lovenox  code Status: Full code Family Communication: None today  disposition Plan: Status is: Inpatient  Remains inpatient appropriate because:Unsafe d/c plan   Dispo: The patient is from: Home              Anticipated d/c is to: ALF              Anticipated d/c date is: 1 day              Patient currently is medically stable to d/c.    Hypoglycemia resolved.  Patient is medically stable for discharge at this time.  Awaiting insurance authorization before discharging to assisted living facility.   Consultants:   none  Procedures:   none  Antimicrobials:   none   Subjective: Seen and examined.  In NAD.  No complaints  Objective: Vitals:  01/20/20 2133 01/20/20 2259 01/21/20 0358 01/21/20 0820  BP: (!) 134/54 (!) 147/66 (!) 142/65 (!) 195/68  Pulse:  78 70 67  Resp: 16 17 16 17   Temp: 98.2 F (36.8 C) 98.9 F (37.2 C) 98.2 F (36.8 C) 98 F (36.7 C)  TempSrc: Oral Oral Oral Oral  SpO2:  99% 98% 98%  Weight:      Height:        Intake/Output Summary (Last 24 hours) at 01/21/2020 1125 Last data filed at 01/21/2020 1043 Gross per 24 hour  Intake 240 ml  Output  --  Net 240 ml   Filed Weights   01/19/20 1558 01/20/20 0230  Weight: 70.7 kg 66.9 kg    Examination:  General exam: Appears calm and comfortable  Respiratory system: Clear to auscultation. Respiratory effort normal. Cardiovascular system: S1 & S2 heard, RRR. No JVD, murmurs, rubs, gallops or clicks. No pedal edema. Gastrointestinal system: Abdomen is nondistended, soft and nontender. No organomegaly or masses felt. Normal bowel sounds heard. Central nervous system: Alert and oriented. No focal neurological deficits. Extremities: Symmetric 5 x 5 power. Skin: No rashes, lesions or ulcers Psychiatry: Judgement and insight appear normal. Mood & affect appropriate.     Data Reviewed: I have personally reviewed following labs and imaging studies  CBC: Recent Labs  Lab 01/19/20 1612 01/20/20 0437  WBC 16.6* 7.9  NEUTROABS 13.2*  --   HGB 13.1 11.5*  HCT 40.1 33.9*  MCV 93.5 88.5  PLT 296 254   Basic Metabolic Panel: Recent Labs  Lab 01/19/20 1642 01/20/20 0437  NA 138 142  K 4.0 3.6  CL 105 107  CO2 24 27  GLUCOSE 176* 82  BUN 16 12  CREATININE 0.83 0.80  CALCIUM 8.6* 8.7*  MG  --  1.6*   GFR: Estimated Creatinine Clearance: 53.6 mL/min (by C-G formula based on SCr of 0.8 mg/dL). Liver Function Tests: Recent Labs  Lab 01/20/20 0437  AST 37  ALT 34  ALKPHOS 60  BILITOT 0.8  PROT 6.0*  ALBUMIN 3.2*   No results for input(s): LIPASE, AMYLASE in the last 168 hours. No results for input(s): AMMONIA in the last 168 hours. Coagulation Profile: Recent Labs  Lab 01/20/20 0437  INR 1.0   Cardiac Enzymes: No results for input(s): CKTOTAL, CKMB, CKMBINDEX, TROPONINI in the last 168 hours. BNP (last 3 results) No results for input(s): PROBNP in the last 8760 hours. HbA1C: Recent Labs    01/20/20 0437  HGBA1C 11.5*   CBG: Recent Labs  Lab 01/20/20 1230 01/20/20 1554 01/20/20 1941 01/20/20 2131 01/21/20 0823  GLUCAP 456* 403* 165* 131* 145*    Lipid Profile: No results for input(s): CHOL, HDL, LDLCALC, TRIG, CHOLHDL, LDLDIRECT in the last 72 hours. Thyroid Function Tests: No results for input(s): TSH, T4TOTAL, FREET4, T3FREE, THYROIDAB in the last 72 hours. Anemia Panel: No results for input(s): VITAMINB12, FOLATE, FERRITIN, TIBC, IRON, RETICCTPCT in the last 72 hours. Sepsis Labs: Recent Labs  Lab 01/19/20 1642 01/19/20 1930 01/20/20 0437  PROCALCITON  --   --  <0.10  LATICACIDVEN 4.3* 4.9*  --     Recent Results (from the past 240 hour(s))  Culture, blood (Routine x 2)     Status: None (Preliminary result)   Collection Time: 01/19/20  4:42 PM   Specimen: BLOOD  Result Value Ref Range Status   Specimen Description BLOOD BLOOD LEFT HAND  Final   Special Requests   Final    BOTTLES DRAWN AEROBIC  AND ANAEROBIC Blood Culture results may not be optimal due to an inadequate volume of blood received in culture bottles   Culture   Final    NO GROWTH 2 DAYS Performed at [facility], [address] [place], [place] [address]    Report Status PENDING  Incomplete  Culture, blood (Routine x 2)     Status: None (Preliminary result)   Collection Time: 01/19/20  4:42 PM   Specimen: BLOOD  Result Value Ref Range Status   Specimen Description BLOOD RIGHT ANTECUBITAL  Final   Special Requests   Final    BOTTLES DRAWN AEROBIC AND ANAEROBIC Blood Culture results may not be optimal due to an inadequate volume of blood received in culture bottles   Culture   Final    NO GROWTH 2 DAYS Performed at [facility], [address], [place], [place] [address]    Report Status PENDING  Incomplete  SARS Coronavirus 2 by RT PCR (hospital order, performed in [facility] hospital lab) Nasopharyngeal Nasopharyngeal Swab     Status: None   Collection Time: 01/19/20  5:49 PM   Specimen: Nasopharyngeal Swab  Result Value Ref Range Status   SARS Coronavirus 2 NEGATIVE NEGATIVE Final    Comment: (NOTE) SARS-CoV-2 target  nucleic acids are NOT DETECTED.  The SARS-CoV-2 RNA is generally detectable in upper and lower respiratory specimens during the acute phase of infection. The lowest concentration of SARS-CoV-2 viral copies this assay can detect is 250 copies / mL. A negative result does not preclude SARS-CoV-2 infection and should not be used as the sole basis for treatment or other patient management decisions.  A negative result may occur with improper specimen collection / handling, submission of specimen other than nasopharyngeal swab, presence of viral mutation(s) within the areas targeted by this assay, and inadequate number of viral copies (<250 copies / mL). A negative result must be combined with clinical observations, patient history, and epidemiological information.  Fact Sheet for Patients:   BoilerBrush.com.cy  Fact Sheet for Healthcare Providers: https://pope.com/  This test is not yet approved or  cleared by the [place] FDA and has been authorized for detection and/or diagnosis of SARS-CoV-2 by FDA under an Emergency Use Authorization (EUA).  This EUA will remain in effect (meaning this test can be used) for the duration of the COVID-19 declaration under Section 564(b)(1) of the Act, 21 U.S.C. section 360bbb-3(b)(1), unless the authorization is terminated or revoked sooner.  Performed at [facility], [address] [place], [place] [address]   MRSA PCR Screening     Status: None   Collection Time: 01/20/20  2:48 AM   Specimen: Nasal Mucosa; Nasopharyngeal  Result Value Ref Range Status   MRSA by PCR NEGATIVE NEGATIVE Final    Comment:        The GeneXpert MRSA Assay (FDA approved for NASAL specimens only), is one component of a comprehensive MRSA colonization surveillance program. It is not intended to diagnose MRSA infection nor to guide or monitor treatment for MRSA infections. Performed at [facility], [address] [place], [place] [address]          Radiology Studies: CT Head Wo Contrast  Result Date: 01/19/2020 CLINICAL DATA:  Altered mental status, hypoglycemia and diaphoresis. EXAM: CT HEAD WITHOUT CONTRAST TECHNIQUE: Contiguous axial images were obtained from the base of the skull through the vertex without intravenous contrast. COMPARISON:  10/09/2019 FINDINGS: Brain: The brainstem, cerebellum, cerebral peduncles, thalami, basal ganglia,  basilar cisterns, and ventricular system appear within normal limits. Periventricular white matter and corona radiata hypodensities favor chronic ischemic microvascular white matter disease. No intracranial hemorrhage, mass lesion, or acute CVA. Vascular: There is atherosclerotic calcification of the cavernous carotid arteries bilaterally. Skull: Unremarkable Sinuses/Orbits: Mild chronic right ethmoid sinusitis. Other: No supplemental non-categorized findings. IMPRESSION: 1. No acute intracranial findings. 2. Periventricular white matter and corona radiata hypodensities favor chronic ischemic microvascular white matter disease. 3. Mild chronic right ethmoid sinusitis. Electronically Signed   By: Van Clines M.D.   On: 01/19/2020 18:25   DG Chest Port 1 View  Result Date: 01/19/2020 CLINICAL DATA:  Concern for sepsis EXAM: PORTABLE CHEST 1 VIEW COMPARISON:  Radiograph 10/23/2019, CT 12/04/2017 FINDINGS: Low volumes and atelectatic changes in the lung bases. No focal consolidation or convincing features of pulmonary edema. No pneumothorax or visible effusion. The aorta is calcified. The remaining cardiomediastinal contours are unremarkable. Few subacute appearing left-sided contiguous rib fractures involving the sixth through ninth left ribs posterolaterally with some callus formation, new since comparison in March of 2021. No other acute or suspicious osseous abnormalities. Degenerative changes in the spine and shoulders. Telemetry  leads overlie the chest. IMPRESSION: 1. Low lung volumes with atelectatic changes in the lung bases. 2. Few subacute appearing left-sided rib fractures involving the sixth through ninth left ribs posterolaterally with callus formation, new since comparison in March of 2021. Electronically Signed   By: Lovena Le M.D.   On: 01/19/2020 17:18        Scheduled Meds: . aspirin EC  81 mg Oral Daily  . brimonidine  1 drop Both Eyes Daily   And  . timolol  1 drop Both Eyes Daily  . Chlorhexidine Gluconate Cloth  6 each Topical Daily  . citalopram  20 mg Oral Daily  . docusate sodium  100 mg Oral q morning - 10a  . donepezil  5 mg Oral QHS  . enoxaparin (LOVENOX) injection  40 mg Subcutaneous Q24H  . feeding supplement (ENSURE ENLIVE)  237 mL Oral BID BM  . hydrALAZINE  25 mg Oral TID  . insulin aspart  0-15 Units Subcutaneous TID WC  . insulin aspart  0-5 Units Subcutaneous QHS  . insulin aspart  3 Units Subcutaneous TID WC  . insulin glargine  25 Units Subcutaneous QHS  . isosorbide mononitrate  30 mg Oral Daily  . losartan  50 mg Oral QHS  . magnesium oxide  400 mg Oral Daily  . memantine  5 mg Oral BID  . multivitamin with minerals  1 tablet Oral BH-q7a  . polyethylene glycol  17 g Oral Daily  . pravastatin  40 mg Oral q1800  . saccharomyces boulardii  250 mg Oral BID   Continuous Infusions:   LOS: 2 days    Time spent: 35 minutes    Sidney Ace, MD [facility] Pager [phone]  If 7PM-7AM, please contact night-coverage 01/21/2020, 11:25 AM

## 2020-01-21 NOTE — Evaluation (Signed)
Occupational Therapy Evaluation Patient Details Name: Vicki [name] MRN: [id number] DOB: 11-15-1940 Today's Date: 01/21/2020    History of Present Illness Vicki [name] is a 79 y.o. female with medical history significant of diabetes, GERD, coronary artery disease, history of CVA, chronic sciatica, Alzheimer's dementia, recurrent DKA's and generalized weakness with acute kidney injury who came to the ER secondary to altered mental status and hypoglycemia.   Clinical Impression   Vicki [name] was seen for OT evaluation this date. Prior to hospital admission, pt was MOD I c mobility using SPC out of home and Independent c BADLs, requiring assist for IADLs. Pt lives c cousin who is physically incapable of assisting. Pt presents to acute OT grossly SUPERVISION only for OOB ADL tasks including toileting, standing hand washing, and LBD. At start of session pt noted to have BP 174/61, MAP 101 - RN notififed and OK'd toilet t/f. Pt reports anxiety re: family health issues, vitals taken following toilet t/f reclined in bed BP 222/62, MAP 121. OT engaged in relaxation techniques. Vitals retaken following relaxation techniques BP 188/61, MAP 104 - RN notified.   Pt has no further identified skilled acute OT needs. Pt reports difficulty with IADLs including medication management - pt reports frequently forgetting to eat after taking her insulin resulting in low blood sugar and near falls. Pt would benefit from [facility] upon hospital discharge to address independent living skills and maximize return to functional independence during meaningful occupations of daily life.      Follow Up Recommendations  Home health OT    Equipment Recommendations  None recommended by OT    Recommendations for Other Services       Precautions / Restrictions Precautions Precautions: None Restrictions Weight Bearing Restrictions: No      Mobility Bed Mobility Overal bed mobility: Independent             General  bed mobility comments: Ind sup<>sit from flat bed   Transfers Overall transfer level: Needs assistance   Transfers: Sit to/from Stand Sit to Stand: Supervision         General transfer comment: Supervision grossly for sit<>stand at bed and commode     Balance Overall balance assessment: Needs assistance Sitting-balance support: No upper extremity supported;Feet supported Sitting balance-Leahy Scale: Normal     Standing balance support: No upper extremity supported;During functional activity Standing balance-Leahy Scale: Good                             ADL either performed or assessed with clinical judgement   ADL Overall ADL's : Modified independent                                       General ADL Comments: MOD I for BADLS. Assist required for IADLs (medication mgmt, living skills)     Vision         Perception     Praxis      Pertinent Vitals/Pain Pain Assessment: No/denies pain     Hand Dominance Right   Extremity/Trunk Assessment Upper Extremity Assessment Upper Extremity Assessment: Overall WFL for tasks assessed   Lower Extremity Assessment Lower Extremity Assessment: Overall WFL for tasks assessed       Communication Communication Communication: No difficulties   Cognition Arousal/Alertness: Awake/alert Behavior During Therapy: WFL for tasks assessed/performed Overall Cognitive Status: History of cognitive  impairments - at baseline                                 General Comments: Pt reports frequently taking her insulin then forgetting to eat resulting in low bood sugar levels   General Comments  Reclined at start of session: BP 174/61, MAP 101 - RN notififed and OK'd toilet t/f. Reclined after toilet t/f: BP 222/62, MAP 121 - pt reports anxiety re: family health issues. OT engaged in relaxation techniques. Reclined in bed following relaxation techniques: BP 188/61, MAP 104 - RN notified.      Exercises Exercises: Other exercises Other Exercises Other Exercises: Pt educated re: OT role, DME recs, d/c recs, energy conservation, medication mgmt, relaxation techniques Other Exercises: LBD, toilet t/f, sup<>sit, sit<>stand, sitting/standing balance/tolerance, bed mobility, hand washing standing sink side   Shoulder Instructions      Home Living Family/patient expects to be discharged to:: Private residence Living Arrangements: Other relatives (cousin) Available Help at Discharge: Family;Available PRN/intermittently Type of Home: House Home Access: Stairs to enter [employer] of Steps: 4 Entrance Stairs-Rails: Right Home Layout: One level     Bathroom Shower/Tub: Producer, television/film/video: Handicapped height Bathroom Accessibility: Yes How Accessible: Accessible via walker Home Equipment: Cane - single point;Shower seat;Grab bars - tub/shower;Hand held Careers information officer - 2 wheels          Prior Functioning/Environment Level of Independence: Independent with assistive device(s)        Comments: Pt has SPC and RW, states she uses [facility] for community mobility. Independent c ADLS, asssit from son for IADLs (groceries, dr appnt). Pt reports difficulty c medication mgmt        OT Problem List: Decreased activity tolerance;Decreased safety awareness      OT Treatment/Interventions:      OT Goals(Current goals can be found in the care plan section) Acute Rehab OT Goals Patient Stated Goal: To return home  OT Goal Formulation: With patient Time For Goal Achievement: 02/04/20 Potential to Achieve Goals: Good ADL Goals Pt Will Transfer to Toilet: with modified independence;ambulating;regular height toilet Additional ADL Goal #1: Pt will independently verbalize plan to implement x3 medication management strategies Additional ADL Goal #2: Pt will Independently verbalize plan to implement x3 falls prevention  OT Frequency:     Barriers to D/C:  Inaccessible home environment;Decreased caregiver support          Co-evaluation              AM-PAC OT "6 Clicks" Daily Activity     Outcome Measure Help from another person eating meals?: None Help from another person taking care of personal grooming?: None Help from another person toileting, which includes using toliet, bedpan, or urinal?: A Little Help from another person bathing (including washing, rinsing, drying)?: A Little Help from another person to put on and taking off regular upper body clothing?: None Help from another person to put on and taking off regular lower body clothing?: A Little 6 Click Score: 21   End of Session Nurse Communication:  (high BP )  Activity Tolerance: Patient tolerated treatment well Patient left: in bed;with call bell/phone within reach;with bed alarm set  OT Visit Diagnosis: Other abnormalities of gait and mobility (R26.89)                Time: 0865-7846 OT Time Calculation (min): 35 min Charges:  OT General Charges $OT Visit:  1 Visit OT Evaluation $OT Eval Moderate Complexity: 1 Mod OT Treatments $Self Care/Home Management : 23-37 mins  Dessie Coma, M.S. OTR/L  01/21/20, 1:40 PM

## 2020-01-21 NOTE — NC FL2 (Signed)
[place] LEVEL OF CARE SCREENING TOOL     IDENTIFICATION  Patient Name: Vicki [name] Birthdate: [birth year] Sex: female Admission Date (Current Location): 01/19/2020  [facility] and [place] Number:  Engineering geologist and Address:  [facility], [address] [place], [place] [address]      Provider Number:    Attending Physician Name and Address:  Sidney Ace, MD  Relative Name and Phone Number:  Christoper Fabian (Sister) [phone]    Current Level of Care: Hospital Recommended Level of Care: [place], Memory Care Prior Approval Number:    Date Approved/Denied:   PASRR Number:    Discharge Plan: Domiciliary (Rest home) (assisted living facility w/memory care)    Current Diagnoses: Patient Active Problem List   Diagnosis Date Noted  . Sepsis ([place]) 01/19/2020  . Alzheimer's dementia without behavioral disturbance ([place])   . DKA (diabetic ketoacidoses) ([place]) 10/14/2019  . Diabetic ketoacidosis without coma associated with diabetes mellitus due to underlying condition ([place])   . Dehydration 10/06/2019  . Generalized weakness 10/06/2019  . Acute renal failure ([place]) 10/06/2019  . Hypertension   . Hypoglycemia 12/10/2017  . Mild dementia ([place]) 08/14/2017  . 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 NSTEMI (non-ST elevated myocardial infarction) ([place]) 10/01/2016  . 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 Diabetes ([place]) 09/29/2016  . GERD (gastroesophageal reflux disease) 09/29/2016  . Nausea and vomiting 03/24/2016  . Diffuse abdominal pain 03/24/2016  . Hypokalemia 03/24/2016  . Chest pain 03/24/2016  . Elevated troponin I level 09/26/2015  . 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[place]) 09/25/2015    Orientation RESPIRATION BLADDER Height & Weight     Self, Time, Place    Continent Weight: 147 lb 7.8 oz (66.9 kg) Height:  5\' 7"  (170.2 cm)  BEHAVIORAL SYMPTOMS/MOOD NEUROLOGICAL  BOWEL NUTRITION STATUS  Other (Comment) (Patient forgets to take her insulin and has to be brought to the hospital)   Continent    AMBULATORY STATUS COMMUNICATION OF NEEDS Skin   Independent Verbally Normal                       Personal Care Assistance Level of Assistance              Functional Limitations Info             SPECIAL CARE FACTORS FREQUENCY  PT (By licensed PT), OT (By licensed OT)     PT Frequency: 5 X weekly OT Frequency: 5 X weekly            Contractures Contractures Info: Not present    Additional Factors Info  Code Status Code Status Info: Full             Current Medications (01/21/2020):  This is the current hospital active medication list Current Facility-Administered Medications  Medication Dose Route Frequency Provider Last Rate Last Admin  . acetaminophen (TYLENOL) tablet 650 mg  650 mg Oral Q6H PRN Elwyn Reach, MD       Or  . acetaminophen (TYLENOL) suppository 650 mg  650 mg Rectal Q6H PRN Elwyn Reach, MD      . aspirin EC tablet 81 mg  81 mg Oral Daily Gala Romney L, MD   81 mg at 01/21/20 0854  . brimonidine (ALPHAGAN) 0.2 % ophthalmic solution 1 drop  1 drop Both Eyes Daily Elwyn Reach, MD   1 drop at 01/21/20 1415  And  . timolol (TIMOPTIC) 0.5 % ophthalmic solution 1 drop  1 drop Both Eyes Daily Rometta Emery, MD   1 drop at 01/21/20 1415  . Chlorhexidine Gluconate Cloth 2 % PADS 6 each  6 each Topical Daily Rometta Emery, MD   6 each at 01/21/20 0858  . citalopram (CELEXA) tablet 20 mg  20 mg Oral Daily Rometta Emery, MD   20 mg at 01/21/20 0856  . docusate sodium (COLACE) capsule 100 mg  100 mg Oral q morning - 10a Rometta Emery, MD   100 mg at 01/21/20 0855  . donepezil (ARICEPT) tablet 5 mg  5 mg Oral QHS Rometta Emery, MD   5 mg at 01/20/20 2133  . enoxaparin (LOVENOX) injection 40 mg  40 mg Subcutaneous Q24H Earlie Lou L, MD   40 mg at 01/20/20 2132  . feeding supplement  (ENSURE ENLIVE) (ENSURE ENLIVE) liquid 237 mL  237 mL Oral BID BM Earlie Lou L, MD   237 mL at 01/21/20 1417  . fluticasone (FLONASE) 50 MCG/ACT nasal spray 2 spray  2 spray Each Nare Daily PRN Earlie Lou L, MD      . hydrALAZINE (APRESOLINE) injection 10 mg  10 mg Intravenous Q4H PRN Manuela Schwartz, NP   10 mg at 01/21/20 1238  . hydrALAZINE (APRESOLINE) tablet 25 mg  25 mg Oral TID Rometta Emery, MD   25 mg at 01/21/20 0855  . insulin aspart (novoLOG) injection 0-15 Units  0-15 Units Subcutaneous TID WC Lolita Patella B, MD   11 Units at 01/21/20 1238  . insulin aspart (novoLOG) injection 0-5 Units  0-5 Units Subcutaneous QHS Garba, Mohammad L, MD      . insulin aspart (novoLOG) injection 3 Units  3 Units Subcutaneous TID WC Lolita Patella B, MD   3 Units at 01/21/20 1239  . insulin glargine (LANTUS) injection 25 Units  25 Units Subcutaneous QHS Lolita Patella B, MD   25 Units at 01/20/20 2134  . isosorbide mononitrate (IMDUR) 24 hr tablet 30 mg  30 mg Oral Daily Rometta Emery, MD   30 mg at 01/21/20 0855  . losartan (COZAAR) tablet 50 mg  50 mg Oral QHS Rometta Emery, MD   50 mg at 01/20/20 2134  . magnesium oxide (MAG-OX) tablet 400 mg  400 mg Oral Daily Earlie Lou L, MD   400 mg at 01/21/20 0856  . memantine (NAMENDA) tablet 5 mg  5 mg Oral BID Rometta Emery, MD   5 mg at 01/21/20 0855  . multivitamin with minerals tablet 1 tablet  1 tablet Oral Reather Converse, MD   1 tablet at 01/21/20 0855  . ondansetron (ZOFRAN) tablet 4 mg  4 mg Oral Q6H PRN Rometta Emery, MD       Or  . ondansetron (ZOFRAN) injection 4 mg  4 mg Intravenous Q6H PRN Garba, Mohammad L, MD      . polyethylene glycol (MIRALAX / GLYCOLAX) packet 17 g  17 g Oral Daily Rometta Emery, MD   17 g at 01/21/20 0854  . pravastatin (PRAVACHOL) tablet 40 mg  40 mg Oral q1800 Rometta Emery, MD   40 mg at 01/20/20 1607  . saccharomyces boulardii (FLORASTOR) capsule 250 mg  250  mg Oral BID Earlie Lou L, MD   250 mg at 01/20/20 2134  . simethicone (MYLICON) chewable tablet 80 mg  80 mg Oral QID PRN Earlie Lou  L, MD         Discharge Medications: Please see discharge summary for a list of discharge medications.  Relevant Imaging Results:  Relevant Lab Results:   Additional Information [phone]  Eilleen Kempf, [place]

## 2020-01-22 LAB — GLUCOSE, CAPILLARY
Glucose-Capillary: 249 mg/dL — ABNORMAL HIGH (ref 70–99)
Glucose-Capillary: 241 mg/dL — ABNORMAL HIGH (ref 70–99)
Glucose-Capillary: 210 mg/dL — ABNORMAL HIGH (ref 70–99)
Glucose-Capillary: 325 mg/dL — ABNORMAL HIGH (ref 70–99)

## 2020-01-22 MED ORDER — INSULIN GLARGINE 100 UNIT/ML ~~LOC~~ SOLN
30.0000 [IU] | Freq: Every day | SUBCUTANEOUS | Status: DC
  Administered 2020-01-22 – 2020-01-23 (×2): 30 [IU] via SUBCUTANEOUS
  Filled 2020-01-22 (×2): qty 0.3

## 2020-01-22 MED ORDER — AMLODIPINE BESYLATE 5 MG PO TABS
5.0000 mg | ORAL_TABLET | Freq: Every day | ORAL | Status: DC
  Administered 2020-01-22 – 2020-01-25 (×4): 5 mg via ORAL
  Filled 2020-01-22 (×4): qty 1

## 2020-01-22 NOTE — TOC Transition Note (Signed)
Transition of Care [facility]) - CM/SW Discharge Note   Patient Details  Name: Vicki [name] MRN: [id number] Date of Birth: [birth year]  Transition of Care [facility]) CM/SW Contact:  Eilleen Kempf, LCSW Phone Number: 01/22/2020, 7:55 AM   Clinical Narrative:    6/19: Patient agreed to go to assisted living, now waiting on bed/room offers. When any come in please share with patient, I sent many to [facility] because she has family there.      Barriers to Discharge: Continued Medical Work up   Patient Goals and CMS Choice        Discharge Placement                       Discharge Plan and Services                                     Social Determinants of Health (SDOH) Interventions     Readmission Risk Interventions Readmission Risk Prevention Plan 01/20/2020 10/15/2019  Transportation Screening Complete Complete  PCP or Specialist Appt within 3-5 Days Complete Complete  HRI or Home Care Consult - Complete  Social Work Consult for Recovery Care Planning/Counseling Complete -  Palliative Care Screening Not Applicable -  Medication Review Oceanographer) Complete Complete  Some recent data might be hidden

## 2020-01-22 NOTE — Progress Notes (Signed)
PROGRESS NOTE    Vicki [name]  GXQ:[id number] DOB: [birth year] DOA: 01/19/2020 PCP: Lynnea Ferrier, MD  Brief Narrative:  HPI: Vicki [name] is a 79 y.o. female with medical history significant of diabetes, GERD, coronary artery disease, history of CVA, chronic sciatica, Alzheimer's dementia, recurrent DKA's and generalized weakness with acute kidney injury who came to the ER secondary to altered mental status and hypoglycemia.  Patient had blood sugar of 40 on arrival.  She was hypothermic as well as septic.  No obvious source of her sepsis.  She had chills in the morning with fever prior to coming in.  She is a poor historian not able to give adequate history.  Patient noted to have fulfill the sepsis criteria except for the source of her infection.  She is currently on Bear hugger and temperature has warmed up.  Blood sugar has also stabilized now.  She takes her insulin but not sure of how much she took at home.  Patient being admitted to the hospital for work-up of sepsis of unknown source as well as hypoglycemia which may be as a result of the sepsis.  6/17: Patient seen and examined.  Sugars have gone from hypoglycemic to hyperglycemic.  No source of infection.  Negative procalcitonin.  Antibiotics discontinued.  Patient stable no distress.  Hypothermia resolved.  6/18: Patient seen and examined.  Sugars better controlled over interval.  Remained stable off antibiotics.  No distress.  Per case management note patient electing to disposition at assisted living facility.  They are working on process.  Will need insurance authorization before discharging.  6/19: Patient seen and examined.  No acute status changes over interval.  No acute distress this morning.  Pending facility acceptance.   Assessment & Plan:   Principal Problem:   Sepsis (HCC) Active Problems:   Elevated troponin I level   HTN (hypertension), malignant   Diabetes (HCC)   GERD (gastroesophageal reflux disease)    Mild dementia (HCC)   Hypoglycemia   Generalized weakness  Hypoglycemia Unclear etiology.  Patient does not positive how much insulin she took Sugars have gone from low to high Suboptimal control over interval Plan: Hypoglycemia protocol Lantus 30 units daily NovoLog 3 units 3 times daily with meals Moderate sliding scale Nightly coverage Carb controlled diet Diabetes coordinator consult  Sepsis, ruled out No clinical indicators of sepsis Hypothermia and hypotension likely driven by hyperglycemia Stop all antibiotics  Diabetes mellitus, insulin-dependent Insulin regimen as above  Hypertensive urgency Blood pressure control is improved Continue current regimen  GERD Protonix  Dementia Stable, no agitation  Elevated troponin Likely demand ischemia secondary to hypertensive urgency and hyperglycemia No suspicion for ACS  Generalized weakness PT and OT consults: rec home health     DVT prophylaxis: Lovenox  code Status: Full code Family Communication: None today  disposition Plan: Status is: Inpatient  Remains inpatient appropriate because:Unsafe d/c plan   Dispo: The patient is from: Home              Anticipated d/c is to: ALF              Anticipated d/c date is: 1 day              Patient currently is medically stable to d/c.    Hypoglycemia resolved.  Patient is medically stable for discharge at this time.  Awaiting insurance authorization before discharging to assisted living facility.   Consultants:   none  Procedures:  none  Antimicrobials:   none   Subjective: Seen and examined.  In NAD.  No complaints  Objective: Vitals:   01/22/20 0006 01/22/20 0451 01/22/20 0826 01/22/20 0936  BP: (!) 151/61 (!) 145/61 (!) 179/78 (!) 175/75  Pulse: 79 71 79   Resp:  16 17   Temp: 99.1 F (37.3 C) 98.7 F (37.1 C) (!) 97 F (36.1 C)   TempSrc: Oral Oral Axillary   SpO2: 94% 98% 97%   Weight:      Height:        Intake/Output  Summary (Last 24 hours) at 01/22/2020 1129 Last data filed at 01/22/2020 1032 Gross per 24 hour  Intake 480 ml  Output --  Net 480 ml   Filed Weights   01/19/20 1558 01/20/20 0230  Weight: 70.7 kg 66.9 kg    Examination:  General exam: Appears calm and comfortable  Respiratory system: Clear to auscultation. Respiratory effort normal. Cardiovascular system: S1 & S2 heard, RRR. No JVD, murmurs, rubs, gallops or clicks. No pedal edema. Gastrointestinal system: Abdomen is nondistended, soft and nontender. No organomegaly or masses felt. Normal bowel sounds heard. Central nervous system: Alert and oriented. No focal neurological deficits. Extremities: Symmetric 5 x 5 power. Skin: No rashes, lesions or ulcers Psychiatry: Judgement and insight appear normal. Mood & affect appropriate.     Data Reviewed: I have personally reviewed following labs and imaging studies  CBC: Recent Labs  Lab 01/19/20 1612 01/20/20 0437  WBC 16.6* 7.9  NEUTROABS 13.2*  --   HGB 13.1 11.5*  HCT 40.1 33.9*  MCV 93.5 88.5  PLT 296 254   Basic Metabolic Panel: Recent Labs  Lab 01/19/20 1642 01/20/20 0437  NA 138 142  K 4.0 3.6  CL 105 107  CO2 24 27  GLUCOSE 176* 82  BUN 16 12  CREATININE 0.83 0.80  CALCIUM 8.6* 8.7*  MG  --  1.6*   GFR: Estimated Creatinine Clearance: 53.6 mL/min (by C-G formula based on SCr of 0.8 mg/dL). Liver Function Tests: Recent Labs  Lab 01/20/20 0437  AST 37  ALT 34  ALKPHOS 60  BILITOT 0.8  PROT 6.0*  ALBUMIN 3.2*   No results for input(s): LIPASE, AMYLASE in the last 168 hours. No results for input(s): AMMONIA in the last 168 hours. Coagulation Profile: Recent Labs  Lab 01/20/20 0437  INR 1.0   Cardiac Enzymes: No results for input(s): CKTOTAL, CKMB, CKMBINDEX, TROPONINI in the last 168 hours. BNP (last 3 results) No results for input(s): PROBNP in the last 8760 hours. HbA1C: Recent Labs    01/20/20 0437  HGBA1C 11.5*   CBG: Recent Labs   Lab 01/21/20 0823 01/21/20 1222 01/21/20 1658 01/21/20 2149 01/22/20 0824  GLUCAP 145* 325* 399* 236* 249*   Lipid Profile: No results for input(s): CHOL, HDL, LDLCALC, TRIG, CHOLHDL, LDLDIRECT in the last 72 hours. Thyroid Function Tests: No results for input(s): TSH, T4TOTAL, FREET4, T3FREE, THYROIDAB in the last 72 hours. Anemia Panel: No results for input(s): VITAMINB12, FOLATE, FERRITIN, TIBC, IRON, RETICCTPCT in the last 72 hours. Sepsis Labs: Recent Labs  Lab 01/19/20 1642 01/19/20 1930 01/20/20 0437  PROCALCITON  --   --  <0.10  LATICACIDVEN 4.3* 4.9*  --     Recent Results (from the past 240 hour(s))  Culture, blood (Routine x 2)     Status: None (Preliminary result)   Collection Time: 01/19/20  4:42 PM   Specimen: BLOOD  Result Value Ref Range Status  Specimen Description BLOOD BLOOD LEFT HAND  Final   Special Requests   Final    BOTTLES DRAWN AEROBIC AND ANAEROBIC Blood Culture results may not be optimal due to an inadequate volume of blood received in culture bottles   Culture   Final    NO GROWTH 3 DAYS Performed at [facility], [address], [place], [place] [address]    Report Status PENDING  Incomplete  Culture, blood (Routine x 2)     Status: None (Preliminary result)   Collection Time: 01/19/20  4:42 PM   Specimen: BLOOD  Result Value Ref Range Status   Specimen Description BLOOD RIGHT ANTECUBITAL  Final   Special Requests   Final    BOTTLES DRAWN AEROBIC AND ANAEROBIC Blood Culture results may not be optimal due to an inadequate volume of blood received in culture bottles   Culture   Final    NO GROWTH 3 DAYS Performed at [facility], [address] [place], [place] [address]    Report Status PENDING  Incomplete  SARS Coronavirus 2 by RT PCR (hospital order, performed in [facility] hospital lab) Nasopharyngeal Nasopharyngeal Swab     Status: None   Collection Time: 01/19/20  5:49 PM   Specimen: Nasopharyngeal Swab   Result Value Ref Range Status   SARS Coronavirus 2 NEGATIVE NEGATIVE Final    Comment: (NOTE) SARS-CoV-2 target nucleic acids are NOT DETECTED.  The SARS-CoV-2 RNA is generally detectable in upper and lower respiratory specimens during the acute phase of infection. The lowest concentration of SARS-CoV-2 viral copies this assay can detect is 250 copies / mL. A negative result does not preclude SARS-CoV-2 infection and should not be used as the sole basis for treatment or other patient management decisions.  A negative result may occur with improper specimen collection / handling, submission of specimen other than nasopharyngeal swab, presence of viral mutation(s) within the areas targeted by this assay, and inadequate number of viral copies (<250 copies / mL). A negative result must be combined with clinical observations, patient history, and epidemiological information.  Fact Sheet for Patients:   BoilerBrush.com.cy  Fact Sheet for Healthcare Providers: https://pope.com/  This test is not yet approved or  cleared by the [place] FDA and has been authorized for detection and/or diagnosis of SARS-CoV-2 by FDA under an Emergency Use Authorization (EUA).  This EUA will remain in effect (meaning this test can be used) for the duration of the COVID-19 declaration under Section 564(b)(1) of the Act, 21 U.S.C. section 360bbb-3(b)(1), unless the authorization is terminated or revoked sooner.  Performed at [facility], [address] [place], [place] [address]   MRSA PCR Screening     Status: None   Collection Time: 01/20/20  2:48 AM   Specimen: Nasal Mucosa; Nasopharyngeal  Result Value Ref Range Status   MRSA by PCR NEGATIVE NEGATIVE Final    Comment:        The GeneXpert MRSA Assay (FDA approved for NASAL specimens only), is one component of a comprehensive MRSA colonization surveillance program. It is  not intended to diagnose MRSA infection nor to guide or monitor treatment for MRSA infections. Performed at [facility], [address] [place], [place] [address]          Radiology Studies: No results found.      Scheduled Meds: . amLODipine  5 mg Oral Daily  . aspirin EC  81 mg Oral Daily  . brimonidine  1 drop  Both Eyes Daily   And  . timolol  1 drop Both Eyes Daily  . Chlorhexidine Gluconate Cloth  6 each Topical Daily  . citalopram  20 mg Oral Daily  . docusate sodium  100 mg Oral q morning - 10a  . donepezil  5 mg Oral QHS  . enoxaparin (LOVENOX) injection  40 mg Subcutaneous Q24H  . feeding supplement (ENSURE ENLIVE)  237 mL Oral BID BM  . hydrALAZINE  25 mg Oral TID  . insulin aspart  0-15 Units Subcutaneous TID WC  . insulin aspart  0-5 Units Subcutaneous QHS  . insulin aspart  3 Units Subcutaneous TID WC  . insulin glargine  30 Units Subcutaneous QHS  . isosorbide mononitrate  30 mg Oral Daily  . losartan  50 mg Oral QHS  . magnesium oxide  400 mg Oral Daily  . memantine  5 mg Oral BID  . multivitamin with minerals  1 tablet Oral BH-q7a  . polyethylene glycol  17 g Oral Daily  . pravastatin  40 mg Oral q1800  . saccharomyces boulardii  250 mg Oral BID   Continuous Infusions:   LOS: 3 days    Time spent: 35 minutes    Sidney Ace, MD [facility] Pager [phone]  If 7PM-7AM, please contact night-coverage 01/22/2020, 11:29 AM

## 2020-01-23 LAB — GLUCOSE, CAPILLARY
Glucose-Capillary: 251 mg/dL — ABNORMAL HIGH (ref 70–99)
Glucose-Capillary: 392 mg/dL — ABNORMAL HIGH (ref 70–99)
Glucose-Capillary: 282 mg/dL — ABNORMAL HIGH (ref 70–99)
Glucose-Capillary: 223 mg/dL — ABNORMAL HIGH (ref 70–99)

## 2020-01-23 MED ORDER — PNEUMOCOCCAL VAC POLYVALENT 25 MCG/0.5ML IJ INJ
0.5000 mL | INJECTION | INTRAMUSCULAR | Status: AC
  Administered 2020-01-24: 11:00:00 0.5 mL via INTRAMUSCULAR
  Filled 2020-01-23: qty 0.5

## 2020-01-23 MED ORDER — INSULIN ASPART 100 UNIT/ML ~~LOC~~ SOLN
4.0000 [IU] | Freq: Three times a day (TID) | SUBCUTANEOUS | Status: DC
  Administered 2020-01-23 (×2): 4 [IU] via SUBCUTANEOUS
  Filled 2020-01-23 (×2): qty 1

## 2020-01-23 NOTE — Plan of Care (Signed)

## 2020-01-23 NOTE — Progress Notes (Signed)
PROGRESS NOTE    Vicki [name]  IAX:[id number] DOB: 09/16/1940 DOA: 01/19/2020 PCP: Adin Hector, MD  Brief Narrative:  HPI: Vicki [name] is a 79 y.o. female with medical history significant of diabetes, GERD, coronary artery disease, history of CVA, chronic sciatica, Alzheimer's dementia, recurrent DKA's and generalized weakness with acute kidney injury who came to the ER secondary to altered mental status and hypoglycemia.  Patient had blood sugar of 40 on arrival.  She was hypothermic as well as septic.  No obvious source of her sepsis.  She had chills in the morning with fever prior to coming in.  She is a poor historian not able to give adequate history.  Patient noted to have fulfill the sepsis criteria except for the source of her infection.  She is currently on Bear hugger and temperature has warmed up.  Blood sugar has also stabilized now.  She takes her insulin but not sure of how much she took at home.  Patient being admitted to the hospital for work-up of sepsis of unknown source as well as hypoglycemia which may be as a result of the sepsis.  6/17: Patient seen and examined.  Sugars have gone from hypoglycemic to hyperglycemic.  No source of infection.  Negative procalcitonin.  Antibiotics discontinued.  Patient stable no distress.  Hypothermia resolved.  6/18: Patient seen and examined.  Sugars better controlled over interval.  Remained stable off antibiotics.  No distress.  Per case management note patient electing to disposition at assisted living facility.  They are working on process.  Will need insurance authorization before discharging.  6/19: Patient seen and examined.  No acute status changes over interval.  No acute distress this morning.  Pending facility acceptance.  6/20: Patient seen and examined.  No acute status changes.  Suboptimal control of sugars over interval.  No acute distress this morning.  Pending facility except has an Psychologist, clinical.   Assessment & Plan:   Principal Problem:   Sepsis ([place]) Active Problems:   Elevated troponin I level   HTN (hypertension), malignant   Diabetes (HCC)   GERD (gastroesophageal reflux disease)   Mild dementia (HCC)   Hypoglycemia   Generalized weakness  Hypoglycemia Unclear etiology.  Patient does not positive how much insulin she took Sugars have gone from low to high Suboptimal control over interval Plan: Hypoglycemia protocol Lantus 30 units daily NovoLog 4 units 3 times daily with meals Moderate sliding scale Nightly coverage Carb controlled diet Diabetes coordinator consult  Sepsis, ruled out No clinical indicators of sepsis Hypothermia and hypotension likely driven by hyperglycemia Stop all antibiotics  Diabetes mellitus, insulin-dependent Insulin regimen as above  Hypertensive urgency Blood pressure control is improved Continue current regimen  GERD Protonix  Dementia Stable, no agitation  Elevated troponin Likely demand ischemia secondary to hypertensive urgency and hyperglycemia No suspicion for ACS  Generalized weakness PT and OT consults: rec home health     DVT prophylaxis: Lovenox  code Status: Full code Family Communication: None today, offered to call, patient declined disposition Plan: Status is: Inpatient  Remains inpatient appropriate because:Unsafe d/c plan   Dispo: The patient is from: Home              Anticipated d/c is to: ALF              Anticipated d/c date is: 1 day              Patient currently is medically stable to  d/c.    Hypoglycemia resolved.  Patient is medically stable for discharge at this time.  Awaiting insurance authorization before discharging to assisted living facility.   Consultants:   none  Procedures:   none  Antimicrobials:   none   Subjective: Seen and examined.  In NAD.  No complaints  Objective: Vitals:   01/22/20 1540 01/22/20 1945 01/23/20 0342 01/23/20 0752   BP: (!) 120/56 (!) 150/67 (!) 144/70 (!) 187/94  Pulse: 67 74 63 71  Resp: 17 16 16 16   Temp: 98.5 F (36.9 C) 98.8 F (37.1 C) 98 F (36.7 C) 98.8 F (37.1 C)  TempSrc: Oral Oral Oral   SpO2: 98% 97% 93% 97%  Weight:      Height:        Intake/Output Summary (Last 24 hours) at 01/23/2020 1002 Last data filed at 01/22/2020 2342 Gross per 24 hour  Intake 720 ml  Output 1 ml  Net 719 ml   Filed Weights   01/19/20 1558 01/20/20 0230  Weight: 70.7 kg 66.9 kg    Examination:  General exam: Appears calm and comfortable  Respiratory system: Clear to auscultation. Respiratory effort normal. Cardiovascular system: S1 & S2 heard, RRR. No JVD, murmurs, rubs, gallops or clicks. No pedal edema. Gastrointestinal system: Abdomen is nondistended, soft and nontender. No organomegaly or masses felt. Normal bowel sounds heard. Central nervous system: Alert and oriented. No focal neurological deficits. Extremities: Symmetric 5 x 5 power. Skin: No rashes, lesions or ulcers Psychiatry: Judgement and insight appear normal. Mood & affect appropriate.     Data Reviewed: I have personally reviewed following labs and imaging studies  CBC: Recent Labs  Lab 01/19/20 1612 01/20/20 0437  WBC 16.6* 7.9  NEUTROABS 13.2*  --   HGB 13.1 11.5*  HCT 40.1 33.9*  MCV 93.5 88.5  PLT 296 254   Basic Metabolic Panel: Recent Labs  Lab 01/19/20 1642 01/20/20 0437  NA 138 142  K 4.0 3.6  CL 105 107  CO2 24 27  GLUCOSE 176* 82  BUN 16 12  CREATININE 0.83 0.80  CALCIUM 8.6* 8.7*  MG  --  1.6*   GFR: Estimated Creatinine Clearance: 53.6 mL/min (by C-G formula based on SCr of 0.8 mg/dL). Liver Function Tests: Recent Labs  Lab 01/20/20 0437  AST 37  ALT 34  ALKPHOS 60  BILITOT 0.8  PROT 6.0*  ALBUMIN 3.2*   No results for input(s): LIPASE, AMYLASE in the last 168 hours. No results for input(s): AMMONIA in the last 168 hours. Coagulation Profile: Recent Labs  Lab 01/20/20 0437   INR 1.0   Cardiac Enzymes: No results for input(s): CKTOTAL, CKMB, CKMBINDEX, TROPONINI in the last 168 hours. BNP (last 3 results) No results for input(s): PROBNP in the last 8760 hours. HbA1C: No results for input(s): HGBA1C in the last 72 hours. CBG: Recent Labs  Lab 01/21/20 2149 01/22/20 0824 01/22/20 1145 01/22/20 1633 01/22/20 2105  GLUCAP 236* 249* 241* 210* 325*   Lipid Profile: No results for input(s): CHOL, HDL, LDLCALC, TRIG, CHOLHDL, LDLDIRECT in the last 72 hours. Thyroid Function Tests: No results for input(s): TSH, T4TOTAL, FREET4, T3FREE, THYROIDAB in the last 72 hours. Anemia Panel: No results for input(s): VITAMINB12, FOLATE, FERRITIN, TIBC, IRON, RETICCTPCT in the last 72 hours. Sepsis Labs: Recent Labs  Lab 01/19/20 1642 01/19/20 1930 01/20/20 0437  PROCALCITON  --   --  <0.10  LATICACIDVEN 4.3* 4.9*  --     Recent Results (from the  past 240 hour(s))  Culture, blood (Routine x 2)     Status: None (Preliminary result)   Collection Time: 01/19/20  4:42 PM   Specimen: BLOOD  Result Value Ref Range Status   Specimen Description BLOOD BLOOD LEFT HAND  Final   Special Requests   Final    BOTTLES DRAWN AEROBIC AND ANAEROBIC Blood Culture results may not be optimal due to an inadequate volume of blood received in culture bottles   Culture   Final    NO GROWTH 4 DAYS Performed at [facility], [address], [place], [place] [address]    Report Status PENDING  Incomplete  Culture, blood (Routine x 2)     Status: None (Preliminary result)   Collection Time: 01/19/20  4:42 PM   Specimen: BLOOD  Result Value Ref Range Status   Specimen Description BLOOD RIGHT ANTECUBITAL  Final   Special Requests   Final    BOTTLES DRAWN AEROBIC AND ANAEROBIC Blood Culture results may not be optimal due to an inadequate volume of blood received in culture bottles   Culture   Final    NO GROWTH 4 DAYS Performed at [facility], [address], [place], [place] [address]    Report Status PENDING  Incomplete  SARS Coronavirus 2 by RT PCR (hospital order, performed in [facility] hospital lab) Nasopharyngeal Nasopharyngeal Swab     Status: None   Collection Time: 01/19/20  5:49 PM   Specimen: Nasopharyngeal Swab  Result Value Ref Range Status   SARS Coronavirus 2 NEGATIVE NEGATIVE Final    Comment: (NOTE) SARS-CoV-2 target nucleic acids are NOT DETECTED.  The SARS-CoV-2 RNA is generally detectable in upper and lower respiratory specimens during the acute phase of infection. The lowest concentration of SARS-CoV-2 viral copies this assay can detect is 250 copies / mL. A negative result does not preclude SARS-CoV-2 infection and should not be used as the sole basis for treatment or other patient management decisions.  A negative result may occur with improper specimen collection / handling, submission of specimen other than nasopharyngeal swab, presence of viral mutation(s) within the areas targeted by this assay, and inadequate number of viral copies (<250 copies / mL). A negative result must be combined with clinical observations, patient history, and epidemiological information.  Fact Sheet for Patients:   BoilerBrush.com.cy  Fact Sheet for Healthcare Providers: https://pope.com/  This test is not yet approved or  cleared by the [place] FDA and has been authorized for detection and/or diagnosis of SARS-CoV-2 by FDA under an Emergency Use Authorization (EUA).  This EUA will remain in effect (meaning this test can be used) for the duration of the COVID-19 declaration under Section 564(b)(1) of the Act, 21 U.S.C. section 360bbb-3(b)(1), unless the authorization is terminated or revoked sooner.  Performed at [facility], [address] [place], [place] [address]   MRSA PCR Screening     Status: None   Collection Time: 01/20/20  2:48 AM   Specimen: Nasal  Mucosa; Nasopharyngeal  Result Value Ref Range Status   MRSA by PCR NEGATIVE NEGATIVE Final    Comment:        The GeneXpert MRSA Assay (FDA approved for NASAL specimens only), is one component of a comprehensive MRSA colonization surveillance program. It is not intended to diagnose MRSA infection nor to guide or monitor treatment for MRSA infections. Performed at [facility], [address] [place], [place] [address]  Radiology Studies: No results found.      Scheduled Meds: . amLODipine  5 mg Oral Daily  . aspirin EC  81 mg Oral Daily  . brimonidine  1 drop Both Eyes Daily   And  . timolol  1 drop Both Eyes Daily  . Chlorhexidine Gluconate Cloth  6 each Topical Daily  . citalopram  20 mg Oral Daily  . docusate sodium  100 mg Oral q morning - 10a  . donepezil  5 mg Oral QHS  . enoxaparin (LOVENOX) injection  40 mg Subcutaneous Q24H  . feeding supplement (ENSURE ENLIVE)  237 mL Oral BID BM  . hydrALAZINE  25 mg Oral TID  . insulin aspart  0-15 Units Subcutaneous TID WC  . insulin aspart  0-5 Units Subcutaneous QHS  . insulin aspart  4 Units Subcutaneous TID WC  . insulin glargine  30 Units Subcutaneous QHS  . isosorbide mononitrate  30 mg Oral Daily  . losartan  50 mg Oral QHS  . magnesium oxide  400 mg Oral Daily  . memantine  5 mg Oral BID  . multivitamin with minerals  1 tablet Oral BH-q7a  . polyethylene glycol  17 g Oral Daily  . pravastatin  40 mg Oral q1800  . saccharomyces boulardii  250 mg Oral BID   Continuous Infusions:   LOS: 4 days    Time spent: 35 minutes    Tresa Moore, MD [facility] Pager [phone]  If 7PM-7AM, please contact night-coverage 01/23/2020, 10:02 AM

## 2020-01-23 NOTE — Plan of Care (Signed)
?  Problem: Education: ?Goal: Knowledge of General Education information will improve ?Description: Including pain rating scale, medication(s)/side effects and non-pharmacologic comfort measures ?Outcome: Progressing ?  ?Problem: Health Behavior/Discharge Planning: ?Goal: Ability to manage health-related needs will improve ?Outcome: Progressing ?  ?Problem: Clinical Measurements: ?Goal: Will remain free from infection ?Outcome: Progressing ?  ?Problem: Clinical Measurements: ?Goal: Diagnostic test results will improve ?Outcome: Progressing ?  ?Problem: Clinical Measurements: ?Goal: Respiratory complications will improve ?Outcome: Progressing ?  ?Problem: Clinical Measurements: ?Goal: Cardiovascular complication will be avoided ?Outcome: Progressing ?  ?

## 2020-01-24 LAB — CULTURE, BLOOD (ROUTINE X 2)
Culture: NO GROWTH
Culture: NO GROWTH

## 2020-01-24 LAB — GLUCOSE, CAPILLARY
Glucose-Capillary: 214 mg/dL — ABNORMAL HIGH (ref 70–99)
Glucose-Capillary: 349 mg/dL — ABNORMAL HIGH (ref 70–99)
Glucose-Capillary: 212 mg/dL — ABNORMAL HIGH (ref 70–99)
Glucose-Capillary: 119 mg/dL — ABNORMAL HIGH (ref 70–99)

## 2020-01-24 MED ORDER — INSULIN GLARGINE 100 UNIT/ML ~~LOC~~ SOLN
32.0000 [IU] | Freq: Every day | SUBCUTANEOUS | Status: DC
  Administered 2020-01-24: 22:00:00 32 [IU] via SUBCUTANEOUS
  Filled 2020-01-24 (×2): qty 0.32

## 2020-01-24 MED ORDER — INSULIN ASPART 100 UNIT/ML ~~LOC~~ SOLN
5.0000 [IU] | Freq: Three times a day (TID) | SUBCUTANEOUS | Status: DC
  Administered 2020-01-24 – 2020-01-25 (×5): 5 [IU] via SUBCUTANEOUS
  Filled 2020-01-24 (×5): qty 1

## 2020-01-24 NOTE — Care Management Important Message (Signed)
Important Message  Patient Details  Name: Vicki [name] MRN: [id number] Date of Birth: 10-23-1940   Medicare Important Message Given:  Yes     [name] A Haden Suder 01/24/2020, 2:43 PM

## 2020-01-24 NOTE — Progress Notes (Signed)
   01/24/20 1410  Clinical Encounter Type  Visited With Patient  Visit Type Initial  Referral From Other (Comment)  Consult/Referral To Chaplain  Chaplain visited with patient after being told by her sister that she had been readmitted. Patient said that she is feeling better. Having problem with regulating her sugar. Chaplain prayed with patient and will follow up tomorrow.

## 2020-01-24 NOTE — Progress Notes (Signed)
PROGRESS NOTE    Chinyere Galiano  SFK:[id number] DOB: [birth year] DOA: 01/19/2020 PCP: Lynnea Ferrier, MD  Brief Narrative:  HPI: Vicki [name] is a 79 y.o. female with medical history significant of diabetes, GERD, coronary artery disease, history of CVA, chronic sciatica, Alzheimer's dementia, recurrent DKA's and generalized weakness with acute kidney injury who came to the ER secondary to altered mental status and hypoglycemia.  Patient had blood sugar of 40 on arrival.  She was hypothermic as well as septic.  No obvious source of her sepsis.  She had chills in the morning with fever prior to coming in.  She is a poor historian not able to give adequate history.  Patient noted to have fulfill the sepsis criteria except for the source of her infection.  She is currently on Bear hugger and temperature has warmed up.  Blood sugar has also stabilized now.  She takes her insulin but not sure of how much she took at home.  Patient being admitted to the hospital for work-up of sepsis of unknown source as well as hypoglycemia which may be as a result of the sepsis.  6/17: Patient seen and examined.  Sugars have gone from hypoglycemic to hyperglycemic.  No source of infection.  Negative procalcitonin.  Antibiotics discontinued.  Patient stable no distress.  Hypothermia resolved.  6/18: Patient seen and examined.  Sugars better controlled over interval.  Remained stable off antibiotics.  No distress.  Per case management note patient electing to disposition at assisted living facility.  They are working on process.  Will need insurance authorization before discharging.  6/19: Patient seen and examined.  No acute status changes over interval.  No acute distress this morning.  Pending facility acceptance.  6/20: Patient seen and examined.  No acute status changes.  Suboptimal control of sugars over interval.  No acute distress this morning.  Pending facility except has an Community education officer.  6/21: Patient seen and examined.  No acute status changes.  Improved lysine control over interval.  Not quite at goal.  No acute distress.  Pending facility acceptance and insurance authorization   Assessment & Plan:   Principal Problem:   Sepsis (HCC) Active Problems:   Elevated troponin I level   HTN (hypertension), malignant   Diabetes (HCC)   GERD (gastroesophageal reflux disease)   Mild dementia (HCC)   Hypoglycemia   Generalized weakness  Hypoglycemia Unclear etiology.  Patient does not positive how much insulin she took Sugars have gone from low to high Suboptimal control over interval Plan: Hypoglycemia protocol Lantus 32 units daily NovoLog 5 units 3 times daily with meals Moderate sliding scale Nightly coverage Carb controlled diet Diabetes coordinator consult  Sepsis, ruled out No clinical indicators of sepsis Hypothermia and hypotension likely driven by hyperglycemia Stop all antibiotics  Diabetes mellitus, insulin-dependent Insulin regimen as above  Hypertensive urgency Blood pressure control is improved Continue current regimen  GERD Protonix  Dementia Stable, no agitation  Elevated troponin Likely demand ischemia secondary to hypertensive urgency and hyperglycemia No suspicion for ACS  Generalized weakness PT and OT consults: rec home health     DVT prophylaxis: Lovenox  code Status: Full code Family Communication: None today, offered to call, patient declined disposition Plan: Status is: Inpatient  Remains inpatient appropriate because:Unsafe d/c plan   Dispo: The patient is from: Home              Anticipated d/c is to: ALF  Anticipated d/c date is: 1 day              Patient currently is medically stable to d/c.    Hypoglycemia resolved.  Patient is medically stable for discharge at this time.  Awaiting insurance authorization before discharging to assisted living facility.   Consultants:    none  Procedures:   none  Antimicrobials:   none   Subjective: Seen and examined.  In NAD.  No complaints  Objective: Vitals:   01/23/20 1948 01/23/20 2339 01/24/20 0447 01/24/20 0847  BP: (!) 126/57 (!) 112/52 (!) 168/78 114/81  Pulse: 73 73 73 80  Resp: 16 16 16 18   Temp: 98.4 F (36.9 C) 98 F (36.7 C) 97.9 F (36.6 C) 98.4 F (36.9 C)  TempSrc: Oral Oral Oral   SpO2: 98% 96% 100% 99%  Weight:      Height:        Intake/Output Summary (Last 24 hours) at 01/24/2020 1231 Last data filed at 01/24/2020 0900 Gross per 24 hour  Intake 120 ml  Output --  Net 120 ml   Filed Weights   01/19/20 1558 01/20/20 0230  Weight: 70.7 kg 66.9 kg    Examination:  General exam: Appears calm and comfortable  Respiratory system: Clear to auscultation. Respiratory effort normal. Cardiovascular system: S1 & S2 heard, RRR. No JVD, murmurs, rubs, gallops or clicks. No pedal edema. Gastrointestinal system: Abdomen is nondistended, soft and nontender. No organomegaly or masses felt. Normal bowel sounds heard. Central nervous system: Alert and oriented. No focal neurological deficits. Extremities: Symmetric 5 x 5 power. Skin: No rashes, lesions or ulcers Psychiatry: Judgement and insight appear normal. Mood & affect appropriate.     Data Reviewed: I have personally reviewed following labs and imaging studies  CBC: Recent Labs  Lab 01/19/20 1612 01/20/20 0437  WBC 16.6* 7.9  NEUTROABS 13.2*  --   HGB 13.1 11.5*  HCT 40.1 33.9*  MCV 93.5 88.5  PLT 296 973   Basic Metabolic Panel: Recent Labs  Lab 01/19/20 1642 01/20/20 0437  NA 138 142  K 4.0 3.6  CL 105 107  CO2 24 27  GLUCOSE 176* 82  BUN 16 12  CREATININE 0.83 0.80  CALCIUM 8.6* 8.7*  MG  --  1.6*   GFR: Estimated Creatinine Clearance: 53.6 mL/min (by C-G formula based on SCr of 0.8 mg/dL). Liver Function Tests: Recent Labs  Lab 01/20/20 0437  AST 37  ALT 34  ALKPHOS 60  BILITOT 0.8  PROT 6.0*   ALBUMIN 3.2*   No results for input(s): LIPASE, AMYLASE in the last 168 hours. No results for input(s): AMMONIA in the last 168 hours. Coagulation Profile: Recent Labs  Lab 01/20/20 0437  INR 1.0   Cardiac Enzymes: No results for input(s): CKTOTAL, CKMB, CKMBINDEX, TROPONINI in the last 168 hours. BNP (last 3 results) No results for input(s): PROBNP in the last 8760 hours. HbA1C: No results for input(s): HGBA1C in the last 72 hours. CBG: Recent Labs  Lab 01/23/20 0751 01/23/20 1207 01/23/20 1648 01/23/20 2115 01/24/20 0848  GLUCAP 251* 392* 282* 223* 214*   Lipid Profile: No results for input(s): CHOL, HDL, LDLCALC, TRIG, CHOLHDL, LDLDIRECT in the last 72 hours. Thyroid Function Tests: No results for input(s): TSH, T4TOTAL, FREET4, T3FREE, THYROIDAB in the last 72 hours. Anemia Panel: No results for input(s): VITAMINB12, FOLATE, FERRITIN, TIBC, IRON, RETICCTPCT in the last 72 hours. Sepsis Labs: Recent Labs  Lab 01/19/20 1642 01/19/20 1930 01/20/20 5329  PROCALCITON  --   --  <0.10  LATICACIDVEN 4.3* 4.9*  --     Recent Results (from the past 240 hour(s))  Culture, blood (Routine x 2)     Status: None   Collection Time: 01/19/20  4:42 PM   Specimen: BLOOD  Result Value Ref Range Status   Specimen Description BLOOD BLOOD LEFT HAND  Final   Special Requests   Final    BOTTLES DRAWN AEROBIC AND ANAEROBIC Blood Culture results may not be optimal due to an inadequate volume of blood received in culture bottles   Culture   Final    NO GROWTH 5 DAYS Performed at [facility], [address] [place], [place] [address]    Report Status 01/24/2020 FINAL  Final  Culture, blood (Routine x 2)     Status: None   Collection Time: 01/19/20  4:42 PM   Specimen: BLOOD  Result Value Ref Range Status   Specimen Description BLOOD RIGHT ANTECUBITAL  Final   Special Requests   Final    BOTTLES DRAWN AEROBIC AND ANAEROBIC Blood Culture results may not be optimal due  to an inadequate volume of blood received in culture bottles   Culture   Final    NO GROWTH 5 DAYS Performed at [facility], [address] [place], [place] [address]    Report Status 01/24/2020 FINAL  Final  SARS Coronavirus 2 by RT PCR (hospital order, performed in [facility] hospital lab) Nasopharyngeal Nasopharyngeal Swab     Status: None   Collection Time: 01/19/20  5:49 PM   Specimen: Nasopharyngeal Swab  Result Value Ref Range Status   SARS Coronavirus 2 NEGATIVE NEGATIVE Final    Comment: (NOTE) SARS-CoV-2 target nucleic acids are NOT DETECTED.  The SARS-CoV-2 RNA is generally detectable in upper and lower respiratory specimens during the acute phase of infection. The lowest concentration of SARS-CoV-2 viral copies this assay can detect is 250 copies / mL. A negative result does not preclude SARS-CoV-2 infection and should not be used as the sole basis for treatment or other patient management decisions.  A negative result may occur with improper specimen collection / handling, submission of specimen other than nasopharyngeal swab, presence of viral mutation(s) within the areas targeted by this assay, and inadequate number of viral copies (<250 copies / mL). A negative result must be combined with clinical observations, patient history, and epidemiological information.  Fact Sheet for Patients:   BoilerBrush.com.cy  Fact Sheet for Healthcare Providers: https://pope.com/  This test is not yet approved or  cleared by the [place] FDA and has been authorized for detection and/or diagnosis of SARS-CoV-2 by FDA under an Emergency Use Authorization (EUA).  This EUA will remain in effect (meaning this test can be used) for the duration of the COVID-19 declaration under Section 564(b)(1) of the Act, 21 U.S.C. section 360bbb-3(b)(1), unless the authorization is terminated or revoked sooner.  Performed at [facility], [address] [place], [place] [address]   MRSA PCR Screening     Status: None   Collection Time: 01/20/20  2:48 AM   Specimen: Nasal Mucosa; Nasopharyngeal  Result Value Ref Range Status   MRSA by PCR NEGATIVE NEGATIVE Final    Comment:        The GeneXpert MRSA Assay (FDA approved for NASAL specimens only), is one component of a comprehensive MRSA colonization surveillance program. It is not intended to diagnose MRSA infection nor to guide or monitor treatment for  MRSA infections. Performed at [facility], [address], [place], [place] [address]          Radiology Studies: No results found.      Scheduled Meds: . amLODipine  5 mg Oral Daily  . aspirin EC  81 mg Oral Daily  . brimonidine  1 drop Both Eyes Daily   And  . timolol  1 drop Both Eyes Daily  . Chlorhexidine Gluconate Cloth  6 each Topical Daily  . citalopram  20 mg Oral Daily  . docusate sodium  100 mg Oral q morning - 10a  . donepezil  5 mg Oral QHS  . enoxaparin (LOVENOX) injection  40 mg Subcutaneous Q24H  . feeding supplement (ENSURE ENLIVE)  237 mL Oral BID BM  . hydrALAZINE  25 mg Oral TID  . insulin aspart  0-15 Units Subcutaneous TID WC  . insulin aspart  0-5 Units Subcutaneous QHS  . insulin aspart  5 Units Subcutaneous TID WC  . insulin glargine  32 Units Subcutaneous QHS  . isosorbide mononitrate  30 mg Oral Daily  . losartan  50 mg Oral QHS  . magnesium oxide  400 mg Oral Daily  . memantine  5 mg Oral BID  . multivitamin with minerals  1 tablet Oral BH-q7a  . polyethylene glycol  17 g Oral Daily  . pravastatin  40 mg Oral q1800  . saccharomyces boulardii  250 mg Oral BID   Continuous Infusions:   LOS: 5 days    Time spent: 35 minutes    Tresa Moore, MD [facility] Pager [phone]  If 7PM-7AM, please contact night-coverage 01/24/2020, 12:31 PM

## 2020-01-24 NOTE — Progress Notes (Addendum)
Physical Therapy Treatment Patient Details Name: Vicki [name] MRN: [id number] DOB: [birth year] Today's Date: 01/24/2020    History of Present Illness Pt is an 79 y.o. female presenting to hospital 6/16 with hypoglycemia (blood glucose 40) and AMS,  Pt admitted with sepsis, hypoglycemia, and hypertensive urgency.  PMH includes DM, htn, R sided lesion of sciatic nerve, stroke, Alzheimer's dementia, DKA, NSTEMI, h/o breast surgery.    PT Comments    OOB without assist.  She is able to complete 2 laps around unit with Min guard and no AD.  Stated she uses a cane at home occasionally.  R limp continues.  Pt stated she fell some time prior to admission (seems like several months ago) and it still bothers her. No LOB or buckling noted but +1 for safety.  Stood to brush teeth at sink with supervision.  Remained in reclienr after session.   Follow Up Recommendations  Home health PT;Supervision - Intermittent     Equipment Recommendations  Cane    Recommendations for Other Services       Precautions / Restrictions Precautions Precautions: Fall    Mobility  Bed Mobility Overal bed mobility: Independent                Transfers Overall transfer level: Modified independent Equipment used: None Transfers: Sit to/from Stand Sit to Stand: Supervision;Modified independent (Device/Increase time)            Ambulation/Gait Ambulation/Gait assistance: Min guard Gait Distance (Feet): 320 Feet Assistive device: None Gait Pattern/deviations: Step-through pattern;Decreased stance time - right;Decreased step length - right;Decreased step length - left Gait velocity: cues to slow down at times   General Gait Details: R limp, quick pace   Stairs             Wheelchair Mobility    Modified Rankin (Stroke Patients Only)       Balance Overall balance assessment: Needs assistance Sitting-balance support: No upper extremity supported;Feet supported Sitting  balance-Leahy Scale: Normal     Standing balance support: No upper extremity supported;During functional activity Standing balance-Leahy Scale: Good Standing balance comment: no loss of balance noted during ambulation or functional activities during sessions activities                            Cognition Arousal/Alertness: Awake/alert Behavior During Therapy: WFL for tasks assessed/performed Overall Cognitive Status: No family/caregiver present to determine baseline cognitive functioning                                 General Comments: Oriented to person, place, and general situation      Exercises      General Comments        Pertinent Vitals/Pain Pain Assessment: Faces Faces Pain Scale: Hurts a little bit Pain Location: R hip - from prior fall, limp with gait  Stated it has bothered her since she fell prior to admission Pain Descriptors / Indicators: Sore Pain Intervention(s): Limited activity within patient's tolerance;Monitored during session    Home Living                      Prior Function            PT Goals (current goals can now be found in the care plan section) Progress towards PT goals: Progressing toward goals    Frequency    Min  2X/week      PT Plan Current plan remains appropriate    Co-evaluation              AM-PAC PT "6 Clicks" Mobility   Outcome Measure  Help needed turning from your back to your side while in a flat bed without using bedrails?: None Help needed moving from lying on your back to sitting on the side of a flat bed without using bedrails?: None Help needed moving to and from a bed to a chair (including a wheelchair)?: A Little Help needed standing up from a chair using your arms (e.g., wheelchair or bedside chair)?: A Little Help needed to walk in hospital room?: A Little Help needed climbing 3-5 steps with a railing? : A Little 6 Click Score: 20    End of Session Equipment  Utilized During Treatment: Gait belt Activity Tolerance: Patient tolerated treatment well Patient left: in chair;with call bell/phone within reach;with chair alarm set Nurse Communication: Mobility status;Precautions;Other (comment)       Time: 1155-2080 PT Time Calculation (min) (ACUTE ONLY): 11 min  Charges:  $Gait Training: 8-22 mins                    Chesley Noon, PTA 01/24/20, 1:23 PM

## 2020-01-24 NOTE — Progress Notes (Signed)
Patient would not keep her telemetry on. Messaged Dr. Georgeann Oppenheim and he said it was okay to discontinue. Made CCMD aware.

## 2020-01-25 LAB — GLUCOSE, CAPILLARY
Glucose-Capillary: 260 mg/dL — ABNORMAL HIGH (ref 70–99)
Glucose-Capillary: 445 mg/dL — ABNORMAL HIGH (ref 70–99)
Glucose-Capillary: 415 mg/dL — ABNORMAL HIGH (ref 70–99)

## 2020-01-25 MED ORDER — INSULIN ASPART 100 UNIT/ML ~~LOC~~ SOLN
5.0000 [IU] | Freq: Once | SUBCUTANEOUS | Status: AC
  Administered 2020-01-25: 5 [IU] via SUBCUTANEOUS
  Filled 2020-01-25: qty 1

## 2020-01-25 MED ORDER — INSULIN ASPART 100 UNIT/ML ~~LOC~~ SOLN: 10.0000 [IU] | Freq: Once | SUBCUTANEOUS | Status: DC

## 2020-01-25 MED ORDER — AMLODIPINE BESYLATE 5 MG PO TABS: 5.0000 mg | ORAL_TABLET | Freq: Every day | ORAL | 0 refills | Status: DC

## 2020-01-25 NOTE — Discharge Summary (Signed)
Physician Discharge Summary  Novis League WEX:[id number] DOB: 1941/02/14 DOA: 01/19/2020  PCP: Adin Hector, MD  Admit date: 01/19/2020 Discharge date: 01/25/2020  Admitted From: Home Disposition:  Home health services  Recommendations for Outpatient Follow-up:  1. Follow up with PCP in 1-2 weeks 2.   Home Health:No Equipment/Devices:None Discharge Condition:Stable CODE STATUS:*Full Diet recommendation: Heart Healthy / Carb Modified Brief/Interim Summary: LFY:BOFBPZW Vicki [name] a 78 y.o.femalewith medical history significant ofdiabetes, GERD, coronary artery disease, history of CVA, chronic sciatica, Alzheimer's dementia, recurrent DKA's and generalized weakness with acute kidney injury who came to the ER secondary to altered mental status and hypoglycemia. Patient had blood sugar of 40 on arrival. She was hypothermic as well as septic. No obvious source of her sepsis. She had chills in the morning with fever prior to coming in. She is a poor historian not able to give adequate history. Patient noted to have fulfill the sepsis criteria except for the source of her infection. She is currently on Bearhugger and temperature has warmed up. Blood sugar has also stabilized now. She takes her insulin but not sure of how much she took at home. Patient being admitted to the hospital for work-up of sepsis of unknown source as well as hypoglycemia which may be as a result of the sepsis.  6/17: Patient seen and examined.  Sugars have gone from hypoglycemic to hyperglycemic.  No source of infection.  Negative procalcitonin.  Antibiotics discontinued.  Patient stable no distress.  Hypothermia resolved.  6/18: Patient seen and examined.  Sugars better controlled over interval.  Remained stable off antibiotics.  No distress.  Per case management note patient electing to disposition at assisted living facility.  They are working on process.  Will need insurance authorization before  discharging.  6/19: Patient seen and examined.  No acute status changes over interval.  No acute distress this morning.  Pending facility acceptance.  6/20: Patient seen and examined.  No acute status changes.  Suboptimal control of sugars over interval.  No acute distress this morning.  Pending facility except has an Ship broker.  6/21: Patient seen and examined.  No acute status changes.  Improved lysine control over interval.  Not quite at goal.  No acute distress.  Pending facility acceptance and insurance authorization  Discharge Diagnoses:  Principal Problem:   Sepsis ([place]) Active Problems:   Elevated troponin I level   HTN (hypertension), malignant   Diabetes (HCC)   GERD (gastroesophageal reflux disease)   Mild dementia (HCC)   Hypoglycemia   Generalized weakness  Hypoglycemia Unclear etiology.  Patient does not positive how much insulin she took Sugars have gone from low to high She has been stable on approximately her home regimen Will continue on discharge Lantus 30 units nightly NovoLog 4 units 3 times daily with meals Sliding scale coverage Carb controlled diet  Sepsis, ruled out No clinical indicators of sepsis Hypothermia and hypotension likely driven by hyperglycemia Stop all antibiotics  Diabetes mellitus, insulin-dependent Insulin regimen as above  Hypertensive urgency Blood pressure control is improved Continue current regimen  GERD Protonix  Dementia Stable, no agitation  Elevated troponin Likely demand ischemia secondary to hypertensive urgency and hyperglycemia No suspicion for ACS  Generalized weakness PT and OT consults: rec home health  Discharge Instructions  Discharge Instructions    Diet - low sodium heart healthy   Complete by: As directed    Increase activity slowly   Complete by: As directed  Allergies as of 01/25/2020   No Known Allergies     Medication List    TAKE these medications    acetaminophen 500 MG tablet Commonly known as: TYLENOL Take 1,000-1,500 mg by mouth every 6 (six) hours as needed for mild pain or headache.   amLODipine 5 MG tablet Commonly known as: NORVASC Take 1 tablet (5 mg total) by mouth daily. Start taking on: January 26, 2020   aspirin 81 MG EC tablet Take 1 tablet (81 mg total) by mouth daily.   Centrum Adults Tabs Take 1 tablet by mouth every morning.   citalopram 20 MG tablet Commonly known as: CELEXA Take 20 mg by mouth daily.   Combigan 0.2-0.5 % ophthalmic solution Generic drug: brimonidine-timolol Place 1 drop into both eyes daily.   donepezil 5 MG tablet Commonly known as: ARICEPT Take 5 mg by mouth at bedtime.   DSS 100 MG Caps Take 100 mg by mouth in the morning and at bedtime.   Ensure Max Protein Liqd Take 330 mLs (11 oz total) by mouth 2 (two) times daily between meals.   fluticasone 50 MCG/ACT nasal spray Commonly known as: FLONASE Place 2 sprays into both nostrils daily.   HumaLOG 100 UNIT/ML injection Generic drug: insulin lispro Inject into the skin. INJECT 4 UNITS SQ THREE TIMES A DAY BEFORE MEALS AND ALSO ACCORDING TO SLIDING SCALE 10 mL 3   hydrALAZINE 50 MG tablet Commonly known as: APRESOLINE Take 0.5 tablets (25 mg total) by mouth 3 (three) times daily.   insulin glargine 100 UNIT/ML injection Commonly known as: LANTUS Inject 0.3 mLs (30 Units total) into the skin at bedtime.   isosorbide mononitrate 30 MG 24 hr tablet Commonly known as: IMDUR Take 1 tablet (30 mg total) by mouth daily.   losartan 50 MG tablet Commonly known as: COZAAR Take 50 mg by mouth at bedtime.   lovastatin 40 MG tablet Commonly known as: MEVACOR Take 40 mg by mouth. TAKE ONE TABLET BY MOUTH EVERY DAY WITH DINNER   Magnesium Oxide 400 (240 Mg) MG Tabs Take 1 tablet by mouth daily.   memantine 5 MG tablet Commonly known as: NAMENDA Take 5 mg by mouth 2 (two) times daily.   polyethylene glycol 17 g  packet Commonly known as: MIRALAX / GLYCOLAX Take 17 g by mouth daily.   Rhopressa 0.02 % Soln Generic drug: Netarsudil Dimesylate Apply to eye.   saccharomyces boulardii 250 MG capsule Commonly known as: FLORASTOR Take 250 mg by mouth 2 (two) times daily.   simethicone 80 MG chewable tablet Commonly known as: MYLICON Chew 1 tablet (80 mg total) by mouth 4 (four) times daily as needed for flatulence.       No Known Allergies  Consultations:  None   Procedures/Studies: CT Head Wo Contrast  Result Date: 01/19/2020 CLINICAL DATA:  Altered mental status, hypoglycemia and diaphoresis. EXAM: CT HEAD WITHOUT CONTRAST TECHNIQUE: Contiguous axial images were obtained from the base of the skull through the vertex without intravenous contrast. COMPARISON:  10/09/2019 FINDINGS: Brain: The brainstem, cerebellum, cerebral peduncles, thalami, basal ganglia, basilar cisterns, and ventricular system appear within normal limits. Periventricular white matter and corona radiata hypodensities favor chronic ischemic microvascular white matter disease. No intracranial hemorrhage, mass lesion, or acute CVA. Vascular: There is atherosclerotic calcification of the cavernous carotid arteries bilaterally. Skull: Unremarkable Sinuses/Orbits: Mild chronic right ethmoid sinusitis. Other: No supplemental non-categorized findings. IMPRESSION: 1. No acute intracranial findings. 2. Periventricular white matter and corona radiata hypodensities favor chronic  ischemic microvascular white matter disease. 3. Mild chronic right ethmoid sinusitis. Electronically Signed   By: Gaylyn Rong M.D.   On: 01/19/2020 18:25   DG Chest Port 1 View  Result Date: 01/19/2020 CLINICAL DATA:  Concern for sepsis EXAM: PORTABLE CHEST 1 VIEW COMPARISON:  Radiograph 10/23/2019, CT 12/04/2017 FINDINGS: Low volumes and atelectatic changes in the lung bases. No focal consolidation or convincing features of pulmonary edema. No pneumothorax  or visible effusion. The aorta is calcified. The remaining cardiomediastinal contours are unremarkable. Few subacute appearing left-sided contiguous rib fractures involving the sixth through ninth left ribs posterolaterally with some callus formation, new since comparison in March of 2021. No other acute or suspicious osseous abnormalities. Degenerative changes in the spine and shoulders. Telemetry leads overlie the chest. IMPRESSION: 1. Low lung volumes with atelectatic changes in the lung bases. 2. Few subacute appearing left-sided rib fractures involving the sixth through ninth left ribs posterolaterally with callus formation, new since comparison in March of 2021. Electronically Signed   By: Kreg Shropshire M.D.   On: 01/19/2020 17:18    (Echo, Carotid, EGD, Colonoscopy, ERCP)    Subjective: Patient seen and examined on day of discharge.  Stable, no distress.  Can resume home insulin regimen  Discharge Exam: Vitals:   01/25/20 0413 01/25/20 0845  BP: (!) 133/54 (!) 164/71  Pulse: 68 72  Resp: 18 17  Temp: 98.4 F (36.9 C) 98.3 F (36.8 C)  SpO2: 96% 100%   Vitals:   01/24/20 2014 01/24/20 2244 01/25/20 0413 01/25/20 0845  BP: (!) 108/49 (!) 120/54 (!) 133/54 (!) 164/71  Pulse: 80 72 68 72  Resp: 20 19 18 17   Temp: 98.1 F (36.7 C) 98.1 F (36.7 C) 98.4 F (36.9 C) 98.3 F (36.8 C)  TempSrc:   Oral Oral  SpO2: 98% 97% 96% 100%  Weight:      Height:        General: Pt is alert, awake, not in acute distress Cardiovascular: RRR, S1/S2 +, no rubs, no gallops Respiratory: CTA bilaterally, no wheezing, no rhonchi Abdominal: Soft, NT, ND, bowel sounds + Extremities: no edema, no cyanosis    The results of significant diagnostics from this hospitalization (including imaging, microbiology, ancillary and laboratory) are listed below for reference.     Microbiology: Recent Results (from the past 240 hour(s))  Culture, blood (Routine x 2)     Status: None   Collection Time:  01/19/20  4:42 PM   Specimen: BLOOD  Result Value Ref Range Status   Specimen Description BLOOD BLOOD LEFT HAND  Final   Special Requests   Final    BOTTLES DRAWN AEROBIC AND ANAEROBIC Blood Culture results may not be optimal due to an inadequate volume of blood received in culture bottles   Culture   Final    NO GROWTH 5 DAYS Performed at [facility], [address] [place], [place]    Report Status 01/24/2020 FINAL  Final  Culture, blood (Routine x 2)     Status: None   Collection Time: 01/19/20  4:42 PM   Specimen: BLOOD  Result Value Ref Range Status   Specimen Description BLOOD RIGHT ANTECUBITAL  Final   Special Requests   Final    BOTTLES DRAWN AEROBIC AND ANAEROBIC Blood Culture results may not be optimal due to an inadequate volume of blood received in culture bottles   Culture   Final    NO GROWTH 5 DAYS Performed at [facility],  [address], [place], [place] [address]    Report Status 01/24/2020 FINAL  Final  SARS Coronavirus 2 by RT PCR (hospital order, performed in [facility] hospital lab) Nasopharyngeal Nasopharyngeal Swab     Status: None   Collection Time: 01/19/20  5:49 PM   Specimen: Nasopharyngeal Swab  Result Value Ref Range Status   SARS Coronavirus 2 NEGATIVE NEGATIVE Final    Comment: (NOTE) SARS-CoV-2 target nucleic acids are NOT DETECTED.  The SARS-CoV-2 RNA is generally detectable in upper and lower respiratory specimens during the acute phase of infection. The lowest concentration of SARS-CoV-2 viral copies this assay can detect is 250 copies / mL. A negative result does not preclude SARS-CoV-2 infection and should not be used as the sole basis for treatment or other patient management decisions.  A negative result may occur with improper specimen collection / handling, submission of specimen other than nasopharyngeal swab, presence of viral mutation(s) within the areas targeted by this assay, and inadequate number  of viral copies (<250 copies / mL). A negative result must be combined with clinical observations, patient history, and epidemiological information.  Fact Sheet for Patients:   BoilerBrush.com.cy  Fact Sheet for Healthcare Providers: https://pope.com/  This test is not yet approved or  cleared by the [place] FDA and has been authorized for detection and/or diagnosis of SARS-CoV-2 by FDA under an Emergency Use Authorization (EUA).  This EUA will remain in effect (meaning this test can be used) for the duration of the COVID-19 declaration under Section 564(b)(1) of the Act, 21 U.S.C. section 360bbb-3(b)(1), unless the authorization is terminated or revoked sooner.  Performed at [facility], [address] [place], [place] [address]   MRSA PCR Screening     Status: None   Collection Time: 01/20/20  2:48 AM   Specimen: Nasal Mucosa; Nasopharyngeal  Result Value Ref Range Status   MRSA by PCR NEGATIVE NEGATIVE Final    Comment:        The GeneXpert MRSA Assay (FDA approved for NASAL specimens only), is one component of a comprehensive MRSA colonization surveillance program. It is not intended to diagnose MRSA infection nor to guide or monitor treatment for MRSA infections. Performed at [facility], [address] [place], [place] [address]      Labs: BNP (last 3 results) Recent Labs    01/19/20 1612  BNP 73.3   Basic Metabolic Panel: Recent Labs  Lab 01/19/20 1642 01/20/20 0437  NA 138 142  K 4.0 3.6  CL 105 107  CO2 24 27  GLUCOSE 176* 82  BUN 16 12  CREATININE 0.83 0.80  CALCIUM 8.6* 8.7*  MG  --  1.6*   Liver Function Tests: Recent Labs  Lab 01/20/20 0437  AST 37  ALT 34  ALKPHOS 60  BILITOT 0.8  PROT 6.0*  ALBUMIN 3.2*   No results for input(s): LIPASE, AMYLASE in the last 168 hours. No results for input(s): AMMONIA in the last 168 hours. CBC: Recent Labs  Lab  01/19/20 1612 01/20/20 0437  WBC 16.6* 7.9  NEUTROABS 13.2*  --   HGB 13.1 11.5*  HCT 40.1 33.9*  MCV 93.5 88.5  PLT 296 254   Cardiac Enzymes: No results for input(s): CKTOTAL, CKMB, CKMBINDEX, TROPONINI in the last 168 hours. BNP: Invalid input(s): POCBNP CBG: Recent Labs  Lab 01/24/20 1342 01/24/20 1646 01/24/20 2142 01/25/20 0806 01/25/20 1128  GLUCAP 349* 212* 119* 260* 445*   D-Dimer No results for input(s):  DDIMER in the last 72 hours. Hgb A1c No results for input(s): HGBA1C in the last 72 hours. Lipid Profile No results for input(s): CHOL, HDL, LDLCALC, TRIG, CHOLHDL, LDLDIRECT in the last 72 hours. Thyroid function studies No results for input(s): TSH, T4TOTAL, T3FREE, THYROIDAB in the last 72 hours.  Invalid input(s): FREET3 Anemia work up No results for input(s): VITAMINB12, FOLATE, FERRITIN, TIBC, IRON, RETICCTPCT in the last 72 hours. Urinalysis    Component Value Date/Time   COLORURINE YELLOW (A) 01/19/2020 1823   APPEARANCEUR CLEAR (A) 01/19/2020 1823   APPEARANCEUR Hazy 04/04/2012 2050   LABSPEC 1.009 01/19/2020 1823   LABSPEC 1.017 04/04/2012 2050   PHURINE 6.0 01/19/2020 1823   GLUCOSEU 50 (A) 01/19/2020 1823   GLUCOSEU >=500 04/04/2012 2050   HGBUR NEGATIVE 01/19/2020 1823   BILIRUBINUR NEGATIVE 01/19/2020 1823   BILIRUBINUR Negative 04/04/2012 2050   KETONESUR NEGATIVE 01/19/2020 1823   PROTEINUR >=300 (A) 01/19/2020 1823   NITRITE NEGATIVE 01/19/2020 1823   LEUKOCYTESUR NEGATIVE 01/19/2020 1823   LEUKOCYTESUR Negative 04/04/2012 2050   Sepsis Labs Invalid input(s): PROCALCITONIN,  WBC,  LACTICIDVEN Microbiology Recent Results (from the past 240 hour(s))  Culture, blood (Routine x 2)     Status: None   Collection Time: 01/19/20  4:42 PM   Specimen: BLOOD  Result Value Ref Range Status   Specimen Description BLOOD BLOOD LEFT HAND  Final   Special Requests   Final    BOTTLES DRAWN AEROBIC AND ANAEROBIC Blood Culture results may not  be optimal due to an inadequate volume of blood received in culture bottles   Culture   Final    NO GROWTH 5 DAYS Performed at [facility], [address] [place], [place] [address]    Report Status 01/24/2020 FINAL  Final  Culture, blood (Routine x 2)     Status: None   Collection Time: 01/19/20  4:42 PM   Specimen: BLOOD  Result Value Ref Range Status   Specimen Description BLOOD RIGHT ANTECUBITAL  Final   Special Requests   Final    BOTTLES DRAWN AEROBIC AND ANAEROBIC Blood Culture results may not be optimal due to an inadequate volume of blood received in culture bottles   Culture   Final    NO GROWTH 5 DAYS Performed at [facility], [address] [place], [place] [address]    Report Status 01/24/2020 FINAL  Final  SARS Coronavirus 2 by RT PCR (hospital order, performed in [facility] hospital lab) Nasopharyngeal Nasopharyngeal Swab     Status: None   Collection Time: 01/19/20  5:49 PM   Specimen: Nasopharyngeal Swab  Result Value Ref Range Status   SARS Coronavirus 2 NEGATIVE NEGATIVE Final    Comment: (NOTE) SARS-CoV-2 target nucleic acids are NOT DETECTED.  The SARS-CoV-2 RNA is generally detectable in upper and lower respiratory specimens during the acute phase of infection. The lowest concentration of SARS-CoV-2 viral copies this assay can detect is 250 copies / mL. A negative result does not preclude SARS-CoV-2 infection and should not be used as the sole basis for treatment or other patient management decisions.  A negative result may occur with improper specimen collection / handling, submission of specimen other than nasopharyngeal swab, presence of viral mutation(s) within the areas targeted by this assay, and inadequate number of viral copies (<250 copies / mL). A negative result must be combined with clinical observations, patient history, and epidemiological information.  Fact Sheet for Patients:    BoilerBrush.com.cyhttps://www.fda.gov/media/136312/download  Fact  Sheet for Healthcare Providers: https://pope.com/  This test is not yet approved or  cleared by the [place] and has been authorized for detection and/or diagnosis of SARS-CoV-2 by FDA under an Emergency Use Authorization (EUA).  This EUA will remain in effect (meaning this test can be used) for the duration of the COVID-19 declaration under Section 564(b)(1) of the Act, 21 U.S.C. section 360bbb-3(b)(1), unless the authorization is terminated or revoked sooner.  Performed at [facility], [address] [place], [place] [address]   MRSA PCR Screening     Status: None   Collection Time: 01/20/20  2:48 AM   Specimen: Nasal Mucosa; Nasopharyngeal  Result Value Ref Range Status   MRSA by PCR NEGATIVE NEGATIVE Final    Comment:        The GeneXpert MRSA Assay (FDA approved for NASAL specimens only), is one component of a comprehensive MRSA colonization surveillance program. It is not intended to diagnose MRSA infection nor to guide or monitor treatment for MRSA infections. Performed at [facility], [address], [place], [place] [address]      Time coordinating discharge: Over 30 minutes  SIGNED:   Tresa Moore, MD  [facility] 01/25/2020, 11:57 AM Pager   If 7PM-7AM, please contact night-coverage

## 2020-01-25 NOTE — Progress Notes (Signed)
Received md to discharge patient to home, reviewed discharge instructions, home meds, prescriptions and follow up appointments with patient and patients sister Robet Leu, patient said it was ok to go over info with both and both  verbalized understanding

## 2020-01-25 NOTE — Progress Notes (Signed)
PROGRESS NOTE    Shawntell Dixson  ZOX:[id number] DOB: [birth year] DOA: 01/19/2020 PCP: Lynnea Ferrier, MD  Brief Narrative:  HPI: Vicki [name] is a 79 y.o. female with medical history significant of diabetes, GERD, coronary artery disease, history of CVA, chronic sciatica, Alzheimer's dementia, recurrent DKA's and generalized weakness with acute kidney injury who came to the ER secondary to altered mental status and hypoglycemia.  Patient had blood sugar of 40 on arrival.  She was hypothermic as well as septic.  No obvious source of her sepsis.  She had chills in the morning with fever prior to coming in.  She is a poor historian not able to give adequate history.  Patient noted to have fulfill the sepsis criteria except for the source of her infection.  She is currently on Bear hugger and temperature has warmed up.  Blood sugar has also stabilized now.  She takes her insulin but not sure of how much she took at home.  Patient being admitted to the hospital for work-up of sepsis of unknown source as well as hypoglycemia which may be as a result of the sepsis.  6/17: Patient seen and examined.  Sugars have gone from hypoglycemic to hyperglycemic.  No source of infection.  Negative procalcitonin.  Antibiotics discontinued.  Patient stable no distress.  Hypothermia resolved.  6/18: Patient seen and examined.  Sugars better controlled over interval.  Remained stable off antibiotics.  No distress.  Per case management note patient electing to disposition at assisted living facility.  They are working on process.  Will need insurance authorization before discharging.  6/19: Patient seen and examined.  No acute status changes over interval.  No acute distress this morning.  Pending facility acceptance.  6/20: Patient seen and examined.  No acute status changes.  Suboptimal control of sugars over interval.  No acute distress this morning.  Pending facility except has an Community education officer.  6/21: Patient seen and examined.  No acute status changes.  Improved lysine control over interval.  Not quite at goal.  No acute distress.  Pending facility acceptance and insurance authorization  6/22: Patient seen and examined.  No acute status changes.  Reasonable glycemic control over interval.  No acute distress.  Pending physical acceptance and insurance authorization.   Assessment & Plan:   Principal Problem:   Sepsis (HCC) Active Problems:   Elevated troponin I level   HTN (hypertension), malignant   Diabetes (HCC)   GERD (gastroesophageal reflux disease)   Mild dementia (HCC)   Hypoglycemia   Generalized weakness  Hypoglycemia Unclear etiology.  Patient does not positive how much insulin she took Sugars have gone from low to high Suboptimal control over interval Plan: Hypoglycemia protocol Lantus 32 units daily NovoLog 5 units 3 times daily with meals Moderate sliding scale Nightly coverage Carb controlled diet Diabetes coordinator consult  Sepsis, ruled out No clinical indicators of sepsis Hypothermia and hypotension likely driven by hyperglycemia Stop all antibiotics  Diabetes mellitus, insulin-dependent Insulin regimen as above  Hypertensive urgency Blood pressure control is improved Continue current regimen  GERD Protonix  Dementia Stable, no agitation  Elevated troponin Likely demand ischemia secondary to hypertensive urgency and hyperglycemia No suspicion for ACS  Generalized weakness PT and OT consults: rec home health Patient elected to dispo to ALF.  Pending placement   DVT prophylaxis: Lovenox  code Status: Full code Family Communication: None today, offered to call, patient declined disposition Plan: Status is: Inpatient  Remains inpatient  appropriate because:Unsafe d/c plan   Dispo: The patient is from: Home              Anticipated d/c is to: ALF              Anticipated d/c date is: 1 day               Patient currently is medically stable to d/c.    Hypoglycemia resolved.  Patient is medically stable for discharge at this time.  Awaiting insurance authorization before discharging to assisted living facility.   Consultants:   none  Procedures:   none  Antimicrobials:   none   Subjective: Seen and examined.  In NAD.  No complaints  Objective: Vitals:   01/24/20 2014 01/24/20 2244 01/25/20 0413 01/25/20 0845  BP: (!) 108/49 (!) 120/54 (!) 133/54 (!) 164/71  Pulse: 80 72 68 72  Resp: 20 19 18 17   Temp: 98.1 F (36.7 C) 98.1 F (36.7 C) 98.4 F (36.9 C) 98.3 F (36.8 C)  TempSrc:   Oral Oral  SpO2: 98% 97% 96% 100%  Weight:      Height:        Intake/Output Summary (Last 24 hours) at 01/25/2020 1125 Last data filed at 01/24/2020 1700 Gross per 24 hour  Intake 480 ml  Output --  Net 480 ml   Filed Weights   01/19/20 1558 01/20/20 0230  Weight: 70.7 kg 66.9 kg    Examination:  General exam: Appears calm and comfortable  Respiratory system: Clear to auscultation. Respiratory effort normal. Cardiovascular system: S1 & S2 heard, RRR. No JVD, murmurs, rubs, gallops or clicks. No pedal edema. Gastrointestinal system: Abdomen is nondistended, soft and nontender. No organomegaly or masses felt. Normal bowel sounds heard. Central nervous system: Alert and oriented. No focal neurological deficits. Extremities: Symmetric 5 x 5 power. Skin: No rashes, lesions or ulcers Psychiatry: Judgement and insight appear normal. Mood & affect appropriate.     Data Reviewed: I have personally reviewed following labs and imaging studies  CBC: Recent Labs  Lab 01/19/20 1612 01/20/20 0437  WBC 16.6* 7.9  NEUTROABS 13.2*  --   HGB 13.1 11.5*  HCT 40.1 33.9*  MCV 93.5 88.5  PLT 296 254   Basic Metabolic Panel: Recent Labs  Lab 01/19/20 1642 01/20/20 0437  NA 138 142  K 4.0 3.6  CL 105 107  CO2 24 27  GLUCOSE 176* 82  BUN 16 12  CREATININE 0.83 0.80  CALCIUM  8.6* 8.7*  MG  --  1.6*   GFR: Estimated Creatinine Clearance: 53.6 mL/min (by C-G formula based on SCr of 0.8 mg/dL). Liver Function Tests: Recent Labs  Lab 01/20/20 0437  AST 37  ALT 34  ALKPHOS 60  BILITOT 0.8  PROT 6.0*  ALBUMIN 3.2*   No results for input(s): LIPASE, AMYLASE in the last 168 hours. No results for input(s): AMMONIA in the last 168 hours. Coagulation Profile: Recent Labs  Lab 01/20/20 0437  INR 1.0   Cardiac Enzymes: No results for input(s): CKTOTAL, CKMB, CKMBINDEX, TROPONINI in the last 168 hours. BNP (last 3 results) No results for input(s): PROBNP in the last 8760 hours. HbA1C: No results for input(s): HGBA1C in the last 72 hours. CBG: Recent Labs  Lab 01/24/20 0848 01/24/20 1342 01/24/20 1646 01/24/20 2142 01/25/20 0806  GLUCAP 214* 349* 212* 119* 260*   Lipid Profile: No results for input(s): CHOL, HDL, LDLCALC, TRIG, CHOLHDL, LDLDIRECT in the last 72 hours. Thyroid Function  Tests: No results for input(s): TSH, T4TOTAL, FREET4, T3FREE, THYROIDAB in the last 72 hours. Anemia Panel: No results for input(s): VITAMINB12, FOLATE, FERRITIN, TIBC, IRON, RETICCTPCT in the last 72 hours. Sepsis Labs: Recent Labs  Lab 01/19/20 1642 01/19/20 1930 01/20/20 0437  PROCALCITON  --   --  <0.10  LATICACIDVEN 4.3* 4.9*  --     Recent Results (from the past 240 hour(s))  Culture, blood (Routine x 2)     Status: None   Collection Time: 01/19/20  4:42 PM   Specimen: BLOOD  Result Value Ref Range Status   Specimen Description BLOOD BLOOD LEFT HAND  Final   Special Requests   Final    BOTTLES DRAWN AEROBIC AND ANAEROBIC Blood Culture results may not be optimal due to an inadequate volume of blood received in culture bottles   Culture   Final    NO GROWTH 5 DAYS Performed at [facility], [address] [place], [place] [address]    Report Status 01/24/2020 FINAL  Final  Culture, blood (Routine x 2)     Status: None   Collection Time:  01/19/20  4:42 PM   Specimen: BLOOD  Result Value Ref Range Status   Specimen Description BLOOD RIGHT ANTECUBITAL  Final   Special Requests   Final    BOTTLES DRAWN AEROBIC AND ANAEROBIC Blood Culture results may not be optimal due to an inadequate volume of blood received in culture bottles   Culture   Final    NO GROWTH 5 DAYS Performed at [facility], [address] [place], [place] [address]    Report Status 01/24/2020 FINAL  Final  SARS Coronavirus 2 by RT PCR (hospital order, performed in [place] hospital lab) Nasopharyngeal Nasopharyngeal Swab     Status: None   Collection Time: 01/19/20  5:49 PM   Specimen: Nasopharyngeal Swab  Result Value Ref Range Status   SARS Coronavirus 2 NEGATIVE NEGATIVE Final    Comment: (NOTE) SARS-CoV-2 target nucleic acids are NOT DETECTED.  The SARS-CoV-2 RNA is generally detectable in upper and lower respiratory specimens during the acute phase of infection. The lowest concentration of SARS-CoV-2 viral copies this assay can detect is 250 copies / mL. A negative result does not preclude SARS-CoV-2 infection and should not be used as the sole basis for treatment or other patient management decisions.  A negative result may occur with improper specimen collection / handling, submission of specimen other than nasopharyngeal swab, presence of viral mutation(s) within the areas targeted by this assay, and inadequate number of viral copies (<250 copies / mL). A negative result must be combined with clinical observations, patient history, and epidemiological information.  Fact Sheet for Patients:   StrictlyIdeas.no  Fact Sheet for Healthcare Providers: BankingDealers.co.za  This test is not yet approved or  cleared by the [place] FDA and has been authorized for detection and/or diagnosis of SARS-CoV-2 by FDA under an Emergency Use Authorization (EUA).  This EUA will remain in  effect (meaning this test can be used) for the duration of the COVID-19 declaration under Section 564(b)(1) of the Act, 21 U.S.C. section 360bbb-3(b)(1), unless the authorization is terminated or revoked sooner.  Performed at [facility], [address] [place], [place] [address]   MRSA PCR Screening     Status: None   Collection Time: 01/20/20  2:48 AM   Specimen: Nasal Mucosa; Nasopharyngeal  Result Value Ref Range Status   MRSA by PCR NEGATIVE NEGATIVE Final  Comment:        The GeneXpert MRSA Assay (FDA approved for NASAL specimens only), is one component of a comprehensive MRSA colonization surveillance program. It is not intended to diagnose MRSA infection nor to guide or monitor treatment for MRSA infections. Performed at [facility], [address], [place], [place] [address]          Radiology Studies: No results found.      Scheduled Meds: . amLODipine  5 mg Oral Daily  . aspirin EC  81 mg Oral Daily  . brimonidine  1 drop Both Eyes Daily   And  . timolol  1 drop Both Eyes Daily  . Chlorhexidine Gluconate Cloth  6 each Topical Daily  . citalopram  20 mg Oral Daily  . docusate sodium  100 mg Oral q morning - 10a  . donepezil  5 mg Oral QHS  . enoxaparin (LOVENOX) injection  40 mg Subcutaneous Q24H  . feeding supplement (ENSURE ENLIVE)  237 mL Oral BID BM  . hydrALAZINE  25 mg Oral TID  . insulin aspart  0-15 Units Subcutaneous TID WC  . insulin aspart  0-5 Units Subcutaneous QHS  . insulin aspart  5 Units Subcutaneous TID WC  . insulin glargine  32 Units Subcutaneous QHS  . isosorbide mononitrate  30 mg Oral Daily  . losartan  50 mg Oral QHS  . magnesium oxide  400 mg Oral Daily  . memantine  5 mg Oral BID  . multivitamin with minerals  1 tablet Oral BH-q7a  . polyethylene glycol  17 g Oral Daily  . pravastatin  40 mg Oral q1800  . saccharomyces boulardii  250 mg Oral BID   Continuous Infusions:   LOS: 6 days     Time spent: 35 minutes    Tresa Moore, MD [facility] Pager [phone]  If 7PM-7AM, please contact night-coverage 01/25/2020, 11:25 AM

## 2020-01-25 NOTE — TOC Progression Note (Signed)
Transition of Care [facility]) - Progression Note    Patient Details  Name: [name] MRN: [id number] Date of Birth: 1940-09-22  Transition of Care [facility]) CM/SW Contact  Shelbie Ammons, RN Phone Number: 01/25/2020, 12:33 PM  Clinical Narrative:   RNCM met with patient at bedside. Patient sitting up on side of bed alert and talking appropriately. Patient reports that she thought that she was going home today and feels like she is ready to go home. Discussed with patient that no facilities have offered a bed and that if they did her insurance would not pay for ALF. Patient reports she would prefer to go home with home health coming in to assist. RNCM notified MD of plans. RNCM reached out to [facility] with [facility] to see if he can accept referral.     Expected Discharge Plan: Home/Self Care Barriers to Discharge: Continued Medical Work up  Expected Discharge Plan and Services Expected Discharge Plan: Home/Self Care       Living arrangements for the past 2 months: Single Family Home Expected Discharge Date: 01/25/20                                     Social Determinants of Health (SDOH) Interventions    Readmission Risk Interventions Readmission Risk Prevention Plan 01/20/2020 10/15/2019  Transportation Screening Complete Complete  PCP or Specialist Appt within 3-5 Days Complete Complete  HRI or [place] - Complete  Social Work Consult for [place] Planning/Counseling Complete -  Palliative Care Screening Not Applicable -  Medication Review Press photographer) Complete Complete  Some recent data might be hidden

## 2020-04-09 ENCOUNTER — Emergency Department
Admission: EM | Admit: 2020-04-09 | Discharge: 2020-04-09 | Disposition: A | Discharge status: Home / Self Care | Payer: Medicare Other | Attending: Emergency Medicine | Admitting: Emergency Medicine

## 2020-04-09 ENCOUNTER — Other Ambulatory Visit: Payer: Self-pay

## 2020-04-09 DIAGNOSIS — Z5321 Procedure and treatment not carried out due to patient leaving prior to being seen by health care provider: Secondary | ICD-10-CM | POA: Insufficient documentation

## 2020-04-09 DIAGNOSIS — R7309 Other abnormal glucose: Secondary | ICD-10-CM | POA: Insufficient documentation

## 2020-04-09 LAB — BASIC METABOLIC PANEL
Anion gap: 13 (ref 5–15)
BUN: 38 mg/dL — ABNORMAL HIGH (ref 8–23)
CO2: 27 mmol/L (ref 22–32)
Calcium: 9 mg/dL (ref 8.9–10.3)
Chloride: 94 mmol/L — ABNORMAL LOW (ref 98–111)
Creatinine, Ser: 1.33 mg/dL — ABNORMAL HIGH (ref 0.44–1.00)
GFR calc Af Amer: 43 mL/min — ABNORMAL LOW (ref >60)
GFR calc non Af Amer: 37 mL/min — ABNORMAL LOW (ref >60)
Glucose, Bld: 560 mg/dL (ref 70–99)
Potassium: 4.5 mmol/L (ref 3.5–5.1)
Sodium: 134 mmol/L — ABNORMAL LOW (ref 135–145)

## 2020-04-09 LAB — URINALYSIS, COMPLETE (UACMP) WITH MICROSCOPIC
Bacteria, UA: NONE SEEN
Bilirubin Urine: NEGATIVE
Glucose, UA: >=500 mg/dL — AB
Hgb urine dipstick: NEGATIVE
Ketones, ur: 20 mg/dL — AB
Nitrite: NEGATIVE
Protein, ur: 100 mg/dL — AB
Specific Gravity, Urine: 1.023 (ref 1.005–1.030)
pH: 5 (ref 5.0–8.0)

## 2020-04-09 LAB — CBC
HCT: 38.5 % (ref 36.0–46.0)
Hemoglobin: 12.8 g/dL (ref 12.0–15.0)
MCH: 30.3 pg (ref 26.0–34.0)
MCHC: 33.2 g/dL (ref 30.0–36.0)
MCV: 91 fL (ref 80.0–100.0)
Platelets: 283 10*3/uL (ref 150–400)
RBC: 4.23 MIL/uL (ref 3.87–5.11)
RDW: 12.5 % (ref 11.5–15.5)
WBC: 10.5 10*3/uL (ref 4.0–10.5)
nRBC: 0 % (ref 0.0–0.2)

## 2020-04-09 LAB — GLUCOSE, CAPILLARY: Glucose-Capillary: 504 mg/dL (ref 70–99)

## 2020-04-09 MED ORDER — INSULIN ASPART 100 UNIT/ML ~~LOC~~ SOLN: 6.0000 [IU] | Freq: Once | SUBCUTANEOUS | Status: DC

## 2020-04-09 MED ORDER — SODIUM CHLORIDE 0.9 % IV BOLUS: 1000.0000 mL | Freq: Once | INTRAVENOUS | Status: DC

## 2020-04-09 MED ORDER — INSULIN ASPART 100 UNIT/ML ~~LOC~~ SOLN: 8.0000 [IU] | Freq: Once | SUBCUTANEOUS | Status: DC

## 2020-04-09 NOTE — ED Triage Notes (Signed)
Pt states that she hasn't been feeling well this week, states that all week she has been having a hard time, states that her sugar has been up and down, states that after she drank her soda she felt better, blood sugar is elevated over 500 in triage

## 2020-04-10 ENCOUNTER — Telehealth: Payer: Self-pay | Admitting: Emergency Medicine

## 2020-04-10 NOTE — Telephone Encounter (Signed)
Called patient due to lwot to inquire about condition and follow up plans. No answer and no voicemail available. 

## 2020-04-11 LAB — GLUCOSE, CAPILLARY: Glucose-Capillary: 564 mg/dL (ref 70–99)

## 2020-04-19 ENCOUNTER — Inpatient Hospital Stay
Admission: EM | Admit: 2020-04-19 | Discharge: 2020-04-22 | DRG: 638 | Disposition: A | Discharge status: Home Health | Payer: Medicare Other | Attending: Internal Medicine | Admitting: Internal Medicine

## 2020-04-19 ENCOUNTER — Emergency Department: Payer: Medicare Other

## 2020-04-19 ENCOUNTER — Other Ambulatory Visit: Payer: Self-pay

## 2020-04-19 ENCOUNTER — Encounter: Payer: Self-pay | Admitting: Intensive Care

## 2020-04-19 DIAGNOSIS — E876 Hypokalemia: Secondary | ICD-10-CM | POA: Diagnosis not present

## 2020-04-19 DIAGNOSIS — Z833 Family history of diabetes mellitus: Secondary | ICD-10-CM

## 2020-04-19 DIAGNOSIS — I152 Hypertension secondary to endocrine disorders: Secondary | ICD-10-CM | POA: Diagnosis present

## 2020-04-19 DIAGNOSIS — Z90710 Acquired absence of both cervix and uterus: Secondary | ICD-10-CM | POA: Diagnosis not present

## 2020-04-19 DIAGNOSIS — E1110 Type 2 diabetes mellitus with ketoacidosis without coma: Secondary | ICD-10-CM | POA: Diagnosis present

## 2020-04-19 DIAGNOSIS — I252 Old myocardial infarction: Secondary | ICD-10-CM | POA: Diagnosis not present

## 2020-04-19 DIAGNOSIS — E860 Dehydration: Secondary | ICD-10-CM | POA: Diagnosis present

## 2020-04-19 DIAGNOSIS — Z20822 Contact with and (suspected) exposure to covid-19: Secondary | ICD-10-CM | POA: Diagnosis present

## 2020-04-19 DIAGNOSIS — Z8673 Personal history of transient ischemic attack (TIA), and cerebral infarction without residual deficits: Secondary | ICD-10-CM

## 2020-04-19 DIAGNOSIS — E1011 Type 1 diabetes mellitus with ketoacidosis with coma: Secondary | ICD-10-CM | POA: Diagnosis not present

## 2020-04-19 DIAGNOSIS — Z794 Long term (current) use of insulin: Secondary | ICD-10-CM

## 2020-04-19 DIAGNOSIS — I2510 Atherosclerotic heart disease of native coronary artery without angina pectoris: Secondary | ICD-10-CM | POA: Diagnosis present

## 2020-04-19 DIAGNOSIS — N179 Acute kidney failure, unspecified: Secondary | ICD-10-CM | POA: Diagnosis not present

## 2020-04-19 DIAGNOSIS — F05 Delirium due to known physiological condition: Secondary | ICD-10-CM | POA: Diagnosis present

## 2020-04-19 DIAGNOSIS — F0280 Dementia in other diseases classified elsewhere without behavioral disturbance: Secondary | ICD-10-CM | POA: Diagnosis present

## 2020-04-19 DIAGNOSIS — E870 Hyperosmolality and hypernatremia: Secondary | ICD-10-CM | POA: Diagnosis present

## 2020-04-19 DIAGNOSIS — Z66 Do not resuscitate: Secondary | ICD-10-CM | POA: Diagnosis present

## 2020-04-19 DIAGNOSIS — E1069 Type 1 diabetes mellitus with other specified complication: Secondary | ICD-10-CM | POA: Diagnosis present

## 2020-04-19 DIAGNOSIS — E871 Hypo-osmolality and hyponatremia: Secondary | ICD-10-CM | POA: Diagnosis not present

## 2020-04-19 DIAGNOSIS — Z79899 Other long term (current) drug therapy: Secondary | ICD-10-CM

## 2020-04-19 DIAGNOSIS — G309 Alzheimer's disease, unspecified: Secondary | ICD-10-CM | POA: Diagnosis present

## 2020-04-19 DIAGNOSIS — F0150 Vascular dementia without behavioral disturbance: Secondary | ICD-10-CM | POA: Diagnosis present

## 2020-04-19 DIAGNOSIS — K219 Gastro-esophageal reflux disease without esophagitis: Secondary | ICD-10-CM | POA: Diagnosis present

## 2020-04-19 DIAGNOSIS — Z7982 Long term (current) use of aspirin: Secondary | ICD-10-CM

## 2020-04-19 DIAGNOSIS — E875 Hyperkalemia: Secondary | ICD-10-CM | POA: Diagnosis present

## 2020-04-19 DIAGNOSIS — E785 Hyperlipidemia, unspecified: Secondary | ICD-10-CM | POA: Diagnosis present

## 2020-04-19 DIAGNOSIS — E1310 Other specified diabetes mellitus with ketoacidosis without coma: Secondary | ICD-10-CM

## 2020-04-19 DIAGNOSIS — E1010 Type 1 diabetes mellitus with ketoacidosis without coma: Principal | ICD-10-CM | POA: Diagnosis present

## 2020-04-19 DIAGNOSIS — E1159 Type 2 diabetes mellitus with other circulatory complications: Secondary | ICD-10-CM | POA: Diagnosis present

## 2020-04-19 LAB — BASIC METABOLIC PANEL
Anion gap: 36 — ABNORMAL HIGH (ref 5–15)
BUN: 45 mg/dL — ABNORMAL HIGH (ref 8–23)
BUN: 44 mg/dL — ABNORMAL HIGH (ref 8–23)
CO2: <7 mmol/L — ABNORMAL LOW (ref 22–32)
CO2: 7 mmol/L — ABNORMAL LOW (ref 22–32)
Calcium: 9.6 mg/dL (ref 8.9–10.3)
Calcium: 9.9 mg/dL (ref 8.9–10.3)
Chloride: 92 mmol/L — ABNORMAL LOW (ref 98–111)
Chloride: 96 mmol/L — ABNORMAL LOW (ref 98–111)
Creatinine, Ser: 2.25 mg/dL — ABNORMAL HIGH (ref 0.44–1.00)
Creatinine, Ser: 2.14 mg/dL — ABNORMAL HIGH (ref 0.44–1.00)
GFR calc Af Amer: 23 mL/min — ABNORMAL LOW (ref >60)
GFR calc Af Amer: 24 mL/min — ABNORMAL LOW (ref >60)
GFR calc non Af Amer: 20 mL/min — ABNORMAL LOW (ref >60)
GFR calc non Af Amer: 21 mL/min — ABNORMAL LOW (ref >60)
Glucose, Bld: 945 mg/dL (ref 70–99)
Glucose, Bld: 816 mg/dL (ref 70–99)
Potassium: 5.6 mmol/L — ABNORMAL HIGH (ref 3.5–5.1)
Potassium: 5.2 mmol/L — ABNORMAL HIGH (ref 3.5–5.1)
Sodium: 134 mmol/L — ABNORMAL LOW (ref 135–145)
Sodium: 139 mmol/L (ref 135–145)

## 2020-04-19 LAB — URINALYSIS, COMPLETE (UACMP) WITH MICROSCOPIC
Bacteria, UA: NONE SEEN
Bilirubin Urine: NEGATIVE
Glucose, UA: >=500 mg/dL — AB
Ketones, ur: 80 mg/dL — AB
Leukocytes,Ua: NEGATIVE
Nitrite: NEGATIVE
Protein, ur: NEGATIVE mg/dL
Specific Gravity, Urine: 1.016 (ref 1.005–1.030)
pH: 5 (ref 5.0–8.0)

## 2020-04-19 LAB — BLOOD GAS, VENOUS
Acid-base deficit: 24.7 mmol/L — ABNORMAL HIGH (ref 0.0–2.0)
Bicarbonate: 4.6 mmol/L — ABNORMAL LOW (ref 20.0–28.0)
O2 Saturation: 80.7 %
Patient temperature: 37
pCO2, Ven: <19 mmHg — CL (ref 44.0–60.0)
pH, Ven: 7.02 — CL (ref 7.250–7.430)
pO2, Ven: 68 mmHg — ABNORMAL HIGH (ref 32.0–45.0)

## 2020-04-19 LAB — GLUCOSE, CAPILLARY
Glucose-Capillary: 523 mg/dL (ref 70–99)
Glucose-Capillary: 555 mg/dL (ref 70–99)
Glucose-Capillary: 568 mg/dL (ref 70–99)
Glucose-Capillary: 579 mg/dL (ref 70–99)
Glucose-Capillary: >600 mg/dL (ref 70–99)
Glucose-Capillary: >600 mg/dL (ref 70–99)
Glucose-Capillary: >600 mg/dL (ref 70–99)
Glucose-Capillary: >600 mg/dL (ref 70–99)

## 2020-04-19 LAB — SARS CORONAVIRUS 2 BY RT PCR (HOSPITAL ORDER, PERFORMED IN ~~LOC~~ HOSPITAL LAB): SARS Coronavirus 2: NEGATIVE

## 2020-04-19 LAB — LACTIC ACID, PLASMA: Lactic Acid, Venous: 9.5 mmol/L (ref 0.5–1.9)

## 2020-04-19 LAB — CBC
HCT: 39.6 % (ref 36.0–46.0)
Hemoglobin: 12 g/dL (ref 12.0–15.0)
MCH: 31.2 pg (ref 26.0–34.0)
MCHC: 30.3 g/dL (ref 30.0–36.0)
MCV: 102.9 fL — ABNORMAL HIGH (ref 80.0–100.0)
Platelets: 300 10*3/uL (ref 150–400)
RBC: 3.85 MIL/uL — ABNORMAL LOW (ref 3.87–5.11)
RDW: 13.7 % (ref 11.5–15.5)
WBC: 26.4 10*3/uL — ABNORMAL HIGH (ref 4.0–10.5)
nRBC: 0 % (ref 0.0–0.2)

## 2020-04-19 LAB — OSMOLALITY: Osmolality: 367 mOsm/kg (ref 275–295)

## 2020-04-19 LAB — BETA-HYDROXYBUTYRIC ACID: Beta-Hydroxybutyric Acid: >8 mmol/L — ABNORMAL HIGH (ref 0.05–0.27)

## 2020-04-19 MED ORDER — LACTATED RINGERS IV SOLN: INTRAVENOUS | Status: DC

## 2020-04-19 MED ORDER — DEXTROSE 50 % IV SOLN
0.0000 mL | INTRAVENOUS | Status: DC | PRN
  Administered 2020-04-22: 50 mL via INTRAVENOUS
  Filled 2020-04-19: qty 50

## 2020-04-19 MED ORDER — ASPIRIN EC 81 MG PO TBEC
81.0000 mg | DELAYED_RELEASE_TABLET | Freq: Every day | ORAL | Status: DC
  Administered 2020-04-21 – 2020-04-22 (×2): 81 mg via ORAL
  Filled 2020-04-19 (×2): qty 1

## 2020-04-19 MED ORDER — INSULIN REGULAR(HUMAN) IN NACL 100-0.9 UT/100ML-% IV SOLN
INTRAVENOUS | Status: DC
  Administered 2020-04-19: 7.5 [IU]/h via INTRAVENOUS
  Filled 2020-04-19 (×2): qty 100

## 2020-04-19 MED ORDER — INSULIN ASPART 100 UNIT/ML ~~LOC~~ SOLN
8.0000 [IU] | Freq: Once | SUBCUTANEOUS | Status: AC
  Administered 2020-04-19: 8 [IU] via INTRAVENOUS
  Filled 2020-04-19: qty 1

## 2020-04-19 MED ORDER — ONDANSETRON HCL 4 MG/2ML IJ SOLN
INTRAMUSCULAR | Status: AC
  Administered 2020-04-19: 4 mg via INTRAVENOUS
  Filled 2020-04-19: qty 2

## 2020-04-19 MED ORDER — HEPARIN SODIUM (PORCINE) 5000 UNIT/ML IJ SOLN
5000.0000 [IU] | Freq: Three times a day (TID) | INTRAMUSCULAR | Status: DC
  Administered 2020-04-19 – 2020-04-22 (×6): 5000 [IU] via SUBCUTANEOUS
  Filled 2020-04-19 (×6): qty 1

## 2020-04-19 MED ORDER — DEXTROSE IN LACTATED RINGERS 5 % IV SOLN: INTRAVENOUS | Status: DC

## 2020-04-19 MED ORDER — CITALOPRAM HYDROBROMIDE 20 MG PO TABS
20.0000 mg | ORAL_TABLET | Freq: Every day | ORAL | Status: DC
  Administered 2020-04-21 – 2020-04-22 (×2): 20 mg via ORAL
  Filled 2020-04-19 (×2): qty 1

## 2020-04-19 MED ORDER — ACETAMINOPHEN 325 MG PO TABS: 650.0000 mg | ORAL_TABLET | Freq: Four times a day (QID) | ORAL | Status: DC | PRN

## 2020-04-19 MED ORDER — PRAVASTATIN SODIUM 20 MG PO TABS
40.0000 mg | ORAL_TABLET | Freq: Every day | ORAL | Status: DC
  Administered 2020-04-21: 40 mg via ORAL
  Filled 2020-04-19 (×3): qty 1
  Filled 2020-04-19: qty 2

## 2020-04-19 MED ORDER — LACTATED RINGERS IV BOLUS
1000.0000 mL | Freq: Once | INTRAVENOUS | Status: AC
  Administered 2020-04-19: 1000 mL via INTRAVENOUS

## 2020-04-19 MED ORDER — ACETAMINOPHEN 650 MG RE SUPP: 650.0000 mg | Freq: Four times a day (QID) | RECTAL | Status: DC | PRN

## 2020-04-19 MED ORDER — ONDANSETRON HCL 4 MG/2ML IJ SOLN: 4.0000 mg | Freq: Once | INTRAMUSCULAR | Status: AC

## 2020-04-19 MED ORDER — DONEPEZIL HCL 5 MG PO TABS
5.0000 mg | ORAL_TABLET | Freq: Every day | ORAL | Status: DC
  Administered 2020-04-21: 5 mg via ORAL
  Filled 2020-04-19: qty 1

## 2020-04-19 MED ORDER — MEMANTINE HCL 5 MG PO TABS
5.0000 mg | ORAL_TABLET | Freq: Two times a day (BID) | ORAL | Status: DC
  Administered 2020-04-21 – 2020-04-22 (×3): 5 mg via ORAL
  Filled 2020-04-19 (×3): qty 1

## 2020-04-19 NOTE — ED Notes (Signed)
No oral medications given at this time, Pt is NPO. Pt too altered at this time. pt unable to follow serial commands. pt is alert to painful stimuli and loud voice and physical stimulation Aspiration Risk

## 2020-04-19 NOTE — ED Notes (Signed)
Pt is a difficult stick. This RN has attempted to collect blood draw x2 and was unsuccessful.  2 additional staff members were asked to attempt and were unable to obtain a VBG/ CMP/ LACTIC. 1 STT was collected and sent to the lab for further analysis. Lab called and Vicki [name] was asked to please sent a lab member down to attempt to re-collect the remaining draws.

## 2020-04-19 NOTE — ED Notes (Signed)
Pt given 4mg  zofran per verbal order. Pt actively belching and appearing like she is going to vomit. Pt spit up a scant amount of coffee ground (brown) emesis

## 2020-04-19 NOTE — ED Notes (Signed)
Vicki [name] in lab notified to send phlebotomist for blood draw

## 2020-04-19 NOTE — ED Provider Notes (Signed)
[facility] Emergency Department Provider Note    First MD Initiated Contact with Patient 04/19/20 1827     (approximate)  I have reviewed the triage vital signs and the nursing notes.   HISTORY  Chief Complaint Hyperglycemia, Altered Mental Status, and Weakness  Level V Caveat:  AMS   HPI Vicki [name] is a 79 y.o. female bolus past medical history presents to the ER for evaluation of altered mental status.  Reportedly has been having weakness since yesterday around noon.  Patient arrives via EMS.  Unable to provide any additional history.  Glucose greater than 600.    Past Medical History:  Diagnosis Date  . Diabetes mellitus without complication (HCC)   . GERD (gastroesophageal reflux disease)   . Hypertension   . Lesion of sciatic nerve, right side   . MI (myocardial infarction) (HCC)   . Stroke [facility](HCC)    Family History  Problem Relation Age of Onset  . Heart disease Mother   . Diabetes Mellitus II Father   . Hypertension Sister   . Diabetes Mellitus II Sister   . Breast cancer Sister [id number]  . Hypertension Brother   . Diabetes Mellitus II Brother    Past Surgical History:  Procedure Laterality Date  . ABDOMINAL HYSTERECTOMY    . BREAST BIOPSY Right    neg  . BREAST SURGERY    . LEFT HEART CATH AND CORONARY ANGIOGRAPHY N/A 10/01/2016   Procedure: Left Heart Cath and Coronary Angiography and possible PCI;  Surgeon: Alwyn Peawayne D Callwood, MD;  Location: [facility] INVASIVE CV LAB;  Service: Cardiovascular;  Laterality: N/A;   Patient Active Problem List   Diagnosis Date Noted  . Sepsis (HCC) 01/19/2020  . Alzheimer's dementia without behavioral disturbance (HCC)   . DKA (diabetic ketoacidoses) (HCC) 10/14/2019  . Diabetic ketoacidosis without coma associated with diabetes mellitus due to underlying condition (HCC)   . Dehydration 10/06/2019  . Generalized weakness 10/06/2019  . Acute renal failure (HCC) 10/06/2019  . Hypertension   .  Hypoglycemia 12/10/2017  . 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 NSTEMI (non-ST elevated myocardial infarction) (HCC) 10/01/2016  . 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 Diabetes (HCC) 09/29/2016  . GERD (gastroesophageal reflux disease) 09/29/2016  . Nausea and vomiting 03/24/2016  . Diffuse abdominal pain 03/24/2016  . Hypokalemia 03/24/2016  . Chest pain 03/24/2016  . Elevated troponin I level 09/26/2015  . 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HCC) 09/25/2015      Prior to Admission medications   Medication Sig Start Date End Date Taking? Authorizing Provider  acetaminophen (TYLENOL) 500 MG tablet Take 1,000-1,500 mg by mouth every 6 (six) hours as needed for mild pain or headache.    [provider]  amLODipine (NORVASC) 5 MG tablet Take 1 tablet (5 mg total) by mouth daily. 01/26/20 02/25/20  Tresa MooreSreenath, Sudheer B, MD  aspirin EC 81 MG EC tablet Take 1 tablet (81 mg total) by mouth daily. 03/26/16   [name], [name], MD  brimonidine-timolol (COMBIGAN) 0.2-0.5 % ophthalmic solution Place 1 drop into both eyes daily.    [provider]  citalopram (CELEXA) 20 MG tablet Take 20 mg by mouth daily.    [provider]  Docusate Sodium (DSS) 100 MG CAPS Take 100 mg by mouth in the morning and at bedtime.     [provider]  donepezil (ARICEPT) 5 MG tablet Take 5 mg by mouth at bedtime.  [provider]  Ensure Max Protein (ENSURE MAX PROTEIN) LIQD Take 330 mLs (11 oz total) by mouth 2 (two) times daily between meals. 10/18/19   Rolly Salter, MD  fluticasone Aleda Grana) 50 MCG/ACT nasal spray Place 2 sprays into both nostrils daily.    [provider]  hydrALAZINE (APRESOLINE) 50 MG tablet Take 0.5 tablets (25 mg total) by mouth 3 (three) times daily. 10/27/19   Enedina Finner, MD  insulin glargine (LANTUS) 100 UNIT/ML injection Inject 0.3 mLs (30 Units total) into the skin at bedtime.  10/27/19   Enedina Finner, MD  insulin lispro (HUMALOG) 100 UNIT/ML injection Inject into the skin. INJECT 4 UNITS SQ THREE TIMES A DAY BEFORE MEALS AND ALSO ACCORDING TO SLIDING SCALE 10 mL 3    [provider]  isosorbide mononitrate (IMDUR) 30 MG 24 hr tablet Take 1 tablet (30 mg total) by mouth daily. 03/26/16   Katharina Caper, MD  losartan (COZAAR) 50 MG tablet Take 50 mg by mouth at bedtime.    [provider]  lovastatin (MEVACOR) 40 MG tablet Take 40 mg by mouth. TAKE ONE TABLET BY MOUTH EVERY DAY WITH DINNER    [provider]  Magnesium Oxide 400 (240 Mg) MG TABS Take 1 tablet by mouth daily. 01/10/20   [provider]  memantine (NAMENDA) 5 MG tablet Take 5 mg by mouth 2 (two) times daily.     [provider]  Multiple Vitamins-Minerals (CENTRUM ADULTS) TABS Take 1 tablet by mouth every morning.     [provider]  Netarsudil Dimesylate (RHOPRESSA) 0.02 % SOLN Apply to eye.    [provider]  polyethylene glycol (MIRALAX / GLYCOLAX) packet Take 17 g by mouth daily. 05/03/15   [name], MD  saccharomyces boulardii (FLORASTOR) 250 MG capsule Take 250 mg by mouth 2 (two) times daily.    [provider]  simethicone (MYLICON) 80 MG chewable tablet Chew 1 tablet (80 mg total) by mouth 4 (four) times daily as needed for flatulence. 10/18/19   Rolly Salter, MD    Allergies Patient has no known allergies.    Social History Social History   Tobacco Use  . Smoking status: Never Smoker  . Smokeless tobacco: Never Used  Vaping Use  . Vaping Use: Never used  Substance Use Topics  . Alcohol use: No  . Drug use: No    Review of Systems Patient denies headaches, rhinorrhea, blurry vision, numbness, shortness of breath, chest pain, edema, cough, abdominal pain, nausea, vomiting, diarrhea, dysuria, fevers, rashes or hallucinations unless otherwise stated above in  HPI. ____________________________________________   PHYSICAL EXAM:  VITAL SIGNS: Vitals:   04/19/20 1715  BP: (!) 150/40  Pulse: 87  Resp: 14  Temp: 98.6 F (37 C)  SpO2: 94%    Constitutional: drowsy, encephalopathic, ill appearing Eyes: Conjunctivae are normal.  Head: Atraumatic. Nose: No congestion/rhinnorhea. Mouth/Throat: Mucous membranes are dry  Neck: No stridor. Painless ROM.  Cardiovascular: Normal rate, regular rhythm. Grossly normal heart sounds.  Good peripheral circulation. Respiratory: kussmaul respirations  Gastrointestinal: Soft and nontender. No distention. No abdominal bruits. No CVA tenderness. Genitourinary: deferred Musculoskeletal: No lower extremity tenderness nor edema.  No joint effusions. Neurologic: encephalopathic, will open eyes to voice and follow one step commands, will not provide additional history. No gross focal neurologic deficits are appreciated.  Skin:  Skin is warm, dry and intact. No rash noted. Psychiatric: unable to assess  ____________________________________________   LABS (all labs ordered  are listed, but only abnormal results are displayed)  Results for orders placed or performed during the hospital encounter of 04/19/20 (from the past 24 hour(s))  Glucose, capillary     Status: Abnormal   Collection Time: 04/19/20  5:10 PM  Result Value Ref Range   Glucose-Capillary >600 (HH) 70 - 99 mg/dL   Comment 1 Notify RN    Comment 2 Document in Chart   Basic metabolic panel     Status: Abnormal   Collection Time: 04/19/20  6:02 PM  Result Value Ref Range   Sodium 134 (L) 135 - 145 mmol/L   Potassium 5.6 (H) 3.5 - 5.1 mmol/L   Chloride 92 (L) 98 - 111 mmol/L   CO2 <7 (L) 22 - 32 mmol/L   Glucose, Bld 945 (HH) 70 - 99 mg/dL   BUN 45 (H) 8 - 23 mg/dL   Creatinine, Ser 0.62 (H) 0.44 - 1.00 mg/dL   Calcium 9.6 8.9 - 69.4 mg/dL   GFR calc non Af Amer 20 (L) >60 mL/min   GFR calc Af Amer 23 (L) >60 mL/min   Anion gap NOT  CALCULATED 5 - 15  CBC     Status: Abnormal   Collection Time: 04/19/20  6:02 PM  Result Value Ref Range   WBC 26.4 (H) 4.0 - 10.5 K/uL   RBC 3.85 (L) 3.87 - 5.11 MIL/uL   Hemoglobin 12.0 12.0 - 15.0 g/dL   HCT 85.4 36 - 46 %   MCV 102.9 (H) 80.0 - 100.0 fL   MCH 31.2 26.0 - 34.0 pg   MCHC 30.3 30.0 - 36.0 g/dL   RDW 62.7 03.5 - 00.9 %   Platelets 300 150 - 400 K/uL   nRBC 0.0 0.0 - 0.2 %  Blood gas, venous     Status: Abnormal   Collection Time: 04/19/20  6:02 PM  Result Value Ref Range   pH, Ven 7.02 (LL) 7.25 - 7.43   pCO2, Ven <19.0 (LL) 44 - 60 mmHg   pO2, Ven 68.0 (H) 32 - 45 mmHg   Bicarbonate 4.6 (L) 20.0 - 28.0 mmol/L   Acid-base deficit 24.7 (H) 0.0 - 2.0 mmol/L   O2 Saturation 80.7 %   Patient temperature 37.0    Collection site VEIN    Sample type VENOUS    ____________________________________________  EKG My review and personal interpretation at Time: 20:25   Indication: dka  Rate: 85  Rhythm: sinus Axis: normal Other: normal intervals, nonspecific st abn ____________________________________________  RADIOLOGY  I personally reviewed all radiographic images ordered to evaluate for the above acute complaints and reviewed radiology reports and findings.  These findings were personally discussed with the patient.  Please see medical record for radiology report.  ____________________________________________   PROCEDURES  Procedure(s) performed:  .Critical Care Performed by: Willy Eddy, MD Authorized by: Willy Eddy, MD   Critical care provider statement:    Critical care time (minutes):  45   Critical care time was exclusive of:  Separately billable procedures and treating other patients   Critical care was necessary to treat or prevent imminent or life-threatening deterioration of the following conditions:  Endocrine crisis   Critical care was time spent personally by me on the following activities:  Development of treatment plan with patient  or surrogate, discussions with consultants, evaluation of patient's response to treatment, examination of patient, obtaining history from patient or surrogate, ordering and performing treatments and interventions, ordering and review of laboratory studies, ordering and review  of radiographic studies, pulse oximetry, re-evaluation of patient's condition and review of old charts      Critical Care performed: yes ____________________________________________   INITIAL IMPRESSION / ASSESSMENT AND PLAN / ED COURSE  Pertinent labs & imaging results that were available during my care of the patient were reviewed by me and considered in my medical decision making (see chart for details).   DDX: Dehydration, dka, hhs, sepsis, pna, uti, hypoglycemia, cva, drug effect, withdrawal, encephalitis   Vicki [name] is a 79 y.o. who presents to the ED with altered mental status and hypoglycemia concerning for DKA or HHS as described above.  Patient given IV fluid resuscitation.  The patient will be placed on continuous pulse oximetry and telemetry for monitoring.  Laboratory evaluation will be sent to evaluate for the above complaints.     Clinical Course as of Apr 19 2000  Wed Apr 19, 2020  1932 Patient's mentation is improving after aggressive IV hydration.  She is been placed on IV insulin infusion.   [PR]  2000 Presentation discussed with the patient's Sister Amado Nash.  She was updated on patient's condition.  States the patient is a DNR and DNI but would want medical treatment but in the event of her deterioration would want care to be focused on comfort measures.  Clinically she does appear to be improving with IV resuscitation.  Will discuss with hospitalist for admission.   [PR]    Clinical Course User Index [PR] Willy Eddy, MD    The patient was evaluated in Emergency Department today for the symptoms described in the history of present illness. He/she was evaluated in the context  of the global COVID-19 pandemic, which necessitated consideration that the patient might be at risk for infection with the SARS-CoV-2 virus that causes COVID-19. Institutional protocols and algorithms that pertain to the evaluation of patients at risk for COVID-19 are in a state of rapid change based on information released by regulatory bodies including the CDC and federal and state organizations. These policies and algorithms were followed during the patient's care in the ED.  As part of my medical decision making, I reviewed the following data within the electronic MEDICAL RECORD NUMBER Nursing notes reviewed and incorporated, Labs reviewed, notes from prior ED visits and [place] Controlled Substance Database   ____________________________________________   FINAL CLINICAL IMPRESSION(S) / ED DIAGNOSES  Final diagnoses:  Diabetic ketoacidosis without coma associated with other specified diabetes mellitus (HCC)      NEW MEDICATIONS STARTED DURING THIS VISIT:  New Prescriptions   No medications on file     Note:  This document was prepared using Dragon voice recognition software and may include unintentional dictation errors.    Willy Eddy, MD 04/19/20 2027

## 2020-04-19 NOTE — ED Notes (Signed)
Date and time results received: 04/19/20 10:46 PM  Test: Lactic  Critical Value: 9.5   Name of Provider Notified: Dr. Roxan Hockey   No new orders at this time

## 2020-04-19 NOTE — H&P (Signed)
History and Physical    Vicki [name] OZD:[id number] DOB: 08/12/1940 DOA: 04/19/2020  PCP: Lynnea Ferrier, MD  Patient coming from: Home via EMS  I have personally briefly reviewed patient's old medical records in [facility]  Chief Complaint: Altered mental status, generalized weakness  HPI: Vicki [name] is a 79 y.o. female with medical history significant for poorly controlled type I diabetes, history of CVA,, CAD, HTN, HLD, and mixed Alzheimer's and vascular dementia who presents to the ED for evaluation of altered mental status.  History is limited from patient due to altered mental status and dementia and is otherwise supplemented by EDP, chart review, and sister by phone.  Sister states that patient was recently hospitalized 2 weeks ago in [place] due to issues with her blood sugars.  She was discharged to home where patient lives with a cousin.  She has had reported decreased oral intake.  She reportedly has not been taking her medications regularly.  She has appeared generally weak with altered mental status compared to baseline.  ED Course:  Initial vitals showed BP 150/40, pulse 87, RR 14, temp 98.6 Fahrenheit, SPO2 94% on room air.  Labs show serum glucose 945, sodium 134 (154 when corrected for hyperglycemia), potassium 5.6, bicarb <7, BUN 45, creatinine 2.25, WBC 26.4, hemoglobin 12.0, platelets 300,000, beta hydroxybutyrate >8.00.  VBG shows pH 7.02, PCO2 <19, PO2 68.  Urinalysis shows >= 500 glucose, 80 ketones, negative nitrites, negative leukocytes, no bacteria seen on microscopy.  SARS-CoV-2 PCR is negative.  Portable chest x-ray is negative for focal consolidation, edema, or effusion.  CT head without contrast was negative for acute intracranial abnormality.  Moderate periventricular and subcortical white matter changes and remote left subinsular white matter infarct are noted.  Patient was given 2 L LR, 8 units IV NovoLog, and started on insulin infusion.   The hospitalist service was consulted to admit for further evaluation and management.  Review of Systems:  Unable to obtain full review of systems due to altered mental status and dementia.   Past Medical History:  Diagnosis Date  . Diabetes mellitus without complication (HCC)   . GERD (gastroesophageal reflux disease)   . Hypertension   . Lesion of sciatic nerve, right side   . MI (myocardial infarction) (HCC)   . Stroke [facility])     Past Surgical History:  Procedure Laterality Date  . ABDOMINAL HYSTERECTOMY    . BREAST BIOPSY Right    neg  . BREAST SURGERY    . LEFT HEART CATH AND CORONARY ANGIOGRAPHY N/A 10/01/2016   Procedure: Left Heart Cath and Coronary Angiography and possible PCI;  Surgeon: Alwyn Pea, MD;  Location: [facility] INVASIVE CV LAB;  Service: Cardiovascular;  Laterality: N/A;    Social History:  reports that she has never smoked. She has never used smokeless tobacco. She reports that she does not drink alcohol and does not use drugs.  No Known Allergies  Family History  Problem Relation Age of Onset  . Heart disease Mother   . Diabetes Mellitus II Father   . Hypertension Sister   . Diabetes Mellitus II Sister   . Breast cancer Sister 8  . Hypertension Brother   . Diabetes Mellitus II Brother      Prior to Admission medications   Medication Sig Start Date End Date Taking? Authorizing Provider  acetaminophen (TYLENOL) 500 MG tablet Take 1,000-1,500 mg by mouth every 6 (six) hours as needed for mild pain or headache.  [provider]  amLODipine (NORVASC) 5 MG tablet Take 1 tablet (5 mg total) by mouth daily. 01/26/20 02/25/20  Tresa Moore, MD  aspirin EC 81 MG EC tablet Take 1 tablet (81 mg total) by mouth daily. 03/26/16   Katharina Caper, MD  brimonidine-timolol (COMBIGAN) 0.2-0.5 % ophthalmic solution Place 1 drop into both eyes daily.    [provider]  citalopram (CELEXA) 20 MG tablet Take 20 mg by mouth daily.     [provider]  Docusate Sodium (DSS) 100 MG CAPS Take 100 mg by mouth in the morning and at bedtime.     [provider]  donepezil (ARICEPT) 5 MG tablet Take 5 mg by mouth at bedtime.    [provider]  Ensure Max Protein (ENSURE MAX PROTEIN) LIQD Take 330 mLs (11 oz total) by mouth 2 (two) times daily between meals. 10/18/19   Rolly Salter, MD  fluticasone Aleda Grana) 50 MCG/ACT nasal spray Place 2 sprays into both nostrils daily.    [provider]  hydrALAZINE (APRESOLINE) 50 MG tablet Take 0.5 tablets (25 mg total) by mouth 3 (three) times daily. 10/27/19   Enedina Finner, MD  insulin glargine (LANTUS) 100 UNIT/ML injection Inject 0.3 mLs (30 Units total) into the skin at bedtime. 10/27/19   Enedina Finner, MD  insulin lispro (HUMALOG) 100 UNIT/ML injection Inject into the skin. INJECT 4 UNITS SQ THREE TIMES A DAY BEFORE MEALS AND ALSO ACCORDING TO SLIDING SCALE 10 mL 3    [provider]  isosorbide mononitrate (IMDUR) 30 MG 24 hr tablet Take 1 tablet (30 mg total) by mouth daily. 03/26/16   Katharina Caper, MD  losartan (COZAAR) 50 MG tablet Take 50 mg by mouth at bedtime.    [provider]  lovastatin (MEVACOR) 40 MG tablet Take 40 mg by mouth. TAKE ONE TABLET BY MOUTH EVERY DAY WITH DINNER    [provider]  Magnesium Oxide 400 (240 Mg) MG TABS Take 1 tablet by mouth daily. 01/10/20   [provider]  memantine (NAMENDA) 5 MG tablet Take 5 mg by mouth 2 (two) times daily.     [provider]  Multiple Vitamins-Minerals (CENTRUM ADULTS) TABS Take 1 tablet by mouth every morning.     [provider]  Netarsudil Dimesylate (RHOPRESSA) 0.02 % SOLN Apply to eye.    [provider]  polyethylene glycol (MIRALAX / GLYCOLAX) packet Take 17 g by mouth daily. 05/03/15   [name], MD  saccharomyces boulardii (FLORASTOR) 250 MG capsule Take 250 mg by mouth 2 (two) times daily.    [provider]  simethicone (MYLICON) 80 MG chewable tablet Chew 1 tablet (80 mg total) by mouth 4 (four) times daily as needed for flatulence. 10/18/19   Rolly Salter, MD    Physical Exam: Vitals:   04/19/20 1716 04/19/20 1913 04/19/20 1945 04/19/20 2050  BP:   (!) 151/57 (!) 145/56  Pulse:   93 94  Resp:   20 (!) 23  Temp:  98.1 F (36.7 C)    TempSrc:  Rectal    SpO2:   100% 100%  Weight: 59 kg     Height: 5\' 3"  (1.6 m)     Exam limited due to altered mental status. Constitutional: Ill-appearing elderly woman resting supine in bed, somnolent, in no acute distress Eyes: PERRL, lids and conjunctivae normal ENMT: Mucous membranes are dry. Posterior pharynx clear of any exudate or lesions.edentulous. Neck: normal, supple,  no masses. Respiratory: clear to auscultation anteriorly. Normal respiratory effort. No accessory muscle use.  Cardiovascular: Regular rate and rhythm, no murmurs / rubs / gallops. No extremity edema. 2+ pedal pulses. Abdomen: no tenderness, no masses palpated. Bowel sounds positive.  Musculoskeletal: no clubbing / cyanosis. No joint deformity upper and lower extremities. Good ROM, no contractures. Normal muscle tone.  Skin: no rashes, lesions, ulcers. No induration Neurologic: CN 2-12 grossly intact. Sensation intact, moving all extremities equally. Psychiatric: Somnolent but will intermittently open eyes and nod head yes/no to voice and gentle stimulation.  Labs on Admission: I have personally reviewed following labs and imaging studies  CBC: Recent Labs  Lab 04/19/20 1802  WBC 26.4*  HGB 12.0  HCT 39.6  MCV 102.9*  PLT 300   Basic Metabolic Panel: Recent Labs  Lab 04/19/20 1802  NA 134*  K 5.6*  CL 92*  CO2 <7*  GLUCOSE 945*  BUN 45*  CREATININE 2.25*  CALCIUM 9.6   GFR: Estimated Creatinine Clearance: 16.2 mL/min (A) (by C-G formula based on SCr of 2.25 mg/dL (H)). Liver Function Tests: No results for input(s): AST, ALT, ALKPHOS, BILITOT, PROT,  ALBUMIN in the last 168 hours. No results for input(s): LIPASE, AMYLASE in the last 168 hours. No results for input(s): AMMONIA in the last 168 hours. Coagulation Profile: No results for input(s): INR, PROTIME in the last 168 hours. Cardiac Enzymes: No results for input(s): CKTOTAL, CKMB, CKMBINDEX, TROPONINI in the last 168 hours. BNP (last 3 results) No results for input(s): PROBNP in the last 8760 hours. HbA1C: No results for input(s): HGBA1C in the last 72 hours. CBG: Recent Labs  Lab 04/19/20 1710 04/19/20 1857 04/19/20 2000 04/19/20 2045  GLUCAP >600* >600* >600* >600*   Lipid Profile: No results for input(s): CHOL, HDL, LDLCALC, TRIG, CHOLHDL, LDLDIRECT in the last 72 hours. Thyroid Function Tests: No results for input(s): TSH, T4TOTAL, FREET4, T3FREE, THYROIDAB in the last 72 hours. Anemia Panel: No results for input(s): VITAMINB12, FOLATE, FERRITIN, TIBC, IRON, RETICCTPCT in the last 72 hours. Urine analysis:    Component Value Date/Time   COLORURINE YELLOW (A) 04/19/2020 1907   APPEARANCEUR HAZY (A) 04/19/2020 1907   APPEARANCEUR Hazy 04/04/2012 2050   LABSPEC 1.016 04/19/2020 1907   LABSPEC 1.017 04/04/2012 2050   PHURINE 5.0 04/19/2020 1907   GLUCOSEU >=500 (A) 04/19/2020 1907   GLUCOSEU >=500 04/04/2012 2050   HGBUR SMALL (A) 04/19/2020 1907   BILIRUBINUR NEGATIVE 04/19/2020 1907   BILIRUBINUR Negative 04/04/2012 2050   KETONESUR 80 (A) 04/19/2020 1907   PROTEINUR NEGATIVE 04/19/2020 1907   NITRITE NEGATIVE 04/19/2020 1907   LEUKOCYTESUR NEGATIVE 04/19/2020 1907   LEUKOCYTESUR Negative 04/04/2012 2050    Radiological Exams on Admission: CT Head Wo Contrast  Result Date: 04/19/2020 CLINICAL DATA:  Altered mental status EXAM: CT HEAD WITHOUT CONTRAST TECHNIQUE: Contiguous axial images were obtained from the base of the skull through the vertex without intravenous contrast. COMPARISON:  01/19/2020 FINDINGS: Brain: There is normal anatomic configuration of  the brain. Mild parenchymal volume loss is commensurate with the patient's age. Moderate periventricular and subcortical white matter changes are present likely reflecting the sequela of small vessel ischemia. Borderline ventriculomegaly likely reflects the sequela of central atrophy. Remote infarct noted within the left subinsular white matter. No evidence of acute intracranial hemorrhage or infarct. No abnormal mass effect or midline shift. No abnormal intra or extra-axial mass lesion or fluid collection. The cerebellum is unremarkable. Vascular: No asymmetric hyperdense vasculature at the skull  base. Skull: Intact Sinuses/Orbits: The paranasal sinuses are clear. The orbits are unremarkable. Other: Mastoid air cells and middle ear cavities are clear. IMPRESSION: 1. No acute intracranial abnormality. 2. Moderate periventricular and subcortical white matter changes likely reflecting the sequela of small vessel ischemia. Remote left subinsular white matter infarct. Electronically Signed   By: Helyn NumbersAshesh  Parikh MD   On: 04/19/2020 19:40   DG Chest Portable 1 View  Result Date: 04/19/2020 CLINICAL DATA:  Evaluate for pneumonia. Altered mental status and weakness. EXAM: PORTABLE CHEST 1 VIEW COMPARISON:  04/11/2020 FINDINGS: Normal heart size. No pleural effusion or edema. No airspace opacities identified. Chronic appearing left posterolateral rib deformities are again noted and appears similar IMPRESSION: No acute cardiopulmonary abnormalities. Electronically Signed   By: Signa Kellaylor  Stroud M.D.   On: 04/19/2020 19:20    EKG: Independently reviewed. Sinus rhythm without acute ischemic changes, motion artifact present.  PVCs seen on prior EKG no longer present.  Assessment/Plan Principal Problem:   DKA (diabetic ketoacidoses) (HCC) Active Problems:   AKI (acute kidney injury) (HCC)   Hypertension associated with diabetes (HCC)   Alzheimer's dementia without behavioral disturbance (HCC)   Hypernatremia    Hyperkalemia   Hyperlipidemia  Vicki [name] is a 79 y.o. female with medical history significant for poorly controlled type I diabetes, history of CVA,, CAD, HTN, HLD, and mixed Alzheimer's and vascular dementia who is admitted with DKA.  Severe DKA in setting of type 1 diabetes: -Continue insulin infusion per DKA protocol -Monitor BMET q4h and transition to home subq insulin when anion gap closes -Continue IV LR at current rate and transition to D5 in LR when CBG <250  Acute kidney injury: Due to dehydration in setting of DKA.  Continue IV fluids as above and repeat labs in a.m.  Hold home losartan.  Hypernatremia: Due to significant dehydration.  S/p 2 L fluids in the ED.  Continue IV fluids and monitor labs as above.  Hyperkalemia: K 5.6 on admission.  Anticipate this will improve with insulin infusion and IV fluids.  Hypertension: Holding losartan with AKI.  BP currently stable, resume home hydralazine if needed.  CAD/history of CVA/hyperlipidemia: Continue aspirin 81 mg daily and statin.  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  Continue management as above.  Resume home Namenda, Aricept, Celexa.  Delirium and fall precautions.  DVT prophylaxis: Subcutaneous heparin Code Status: DNR, confirmed with patient's sister Family Communication: Discussed with patient's sister Vicki [name] by phone Disposition Plan: From home, will likely need SNF on discharge Consults called: None Admission status:  Status is: Inpatient  Remains inpatient appropriate because:Persistent severe electrolyte disturbances, IV treatments appropriate due to intensity of illness or inability to take PO and Inpatient level of care appropriate due to severity of illness   Dispo: The patient is from: Home              Anticipated d/c is to: SNF              Anticipated d/c date is: 3 days              Patient currently is not medically stable to  d/c.  Darreld McleanVishal Gregoria Selvy MD [facility]  If 7PM-7AM, please contact night-coverage [email or web address]  04/19/2020, 9:09 PM

## 2020-04-19 NOTE — ED Triage Notes (Addendum)
Patient arrived from home for AMS and weakness since 04/18/20 around noon. Family reports baseline is ambulatory and A&O x4. Upon arrival patient opens eyes to voice/pain and disoriented X4. Blood sugar reading >600

## 2020-04-20 LAB — CBC WITH DIFFERENTIAL/PLATELET
Abs Immature Granulocytes: 0.61 10*3/uL — ABNORMAL HIGH (ref 0.00–0.07)
Basophils Absolute: 0 10*3/uL (ref 0.0–0.1)
Basophils Relative: 0 %
Eosinophils Absolute: 0 10*3/uL (ref 0.0–0.5)
Eosinophils Relative: 0 %
HCT: 32.5 % — ABNORMAL LOW (ref 36.0–46.0)
Hemoglobin: 10.9 g/dL — ABNORMAL LOW (ref 12.0–15.0)
Immature Granulocytes: 3 %
Lymphocytes Relative: 6 %
Lymphs Abs: 1.5 10*3/uL (ref 0.7–4.0)
MCH: 30.7 pg (ref 26.0–34.0)
MCHC: 33.5 g/dL (ref 30.0–36.0)
MCV: 91.5 fL (ref 80.0–100.0)
Monocytes Absolute: 2.2 10*3/uL — ABNORMAL HIGH (ref 0.1–1.0)
Monocytes Relative: 9 %
Neutro Abs: 20.4 10*3/uL — ABNORMAL HIGH (ref 1.7–7.7)
Neutrophils Relative %: 82 %
Platelets: 260 10*3/uL (ref 150–400)
RBC: 3.55 MIL/uL — ABNORMAL LOW (ref 3.87–5.11)
RDW: 13.2 % (ref 11.5–15.5)
WBC: 24.8 10*3/uL — ABNORMAL HIGH (ref 4.0–10.5)
nRBC: 0 % (ref 0.0–0.2)

## 2020-04-20 LAB — MAGNESIUM: Magnesium: 2.5 mg/dL — ABNORMAL HIGH (ref 1.7–2.4)

## 2020-04-20 LAB — BASIC METABOLIC PANEL
Anion gap: 11 (ref 5–15)
Anion gap: 13 (ref 5–15)
BUN: 40 mg/dL — ABNORMAL HIGH (ref 8–23)
BUN: 40 mg/dL — ABNORMAL HIGH (ref 8–23)
CO2: 26 mmol/L (ref 22–32)
CO2: 20 mmol/L — ABNORMAL LOW (ref 22–32)
Calcium: 9.4 mg/dL (ref 8.9–10.3)
Calcium: 8.8 mg/dL — ABNORMAL LOW (ref 8.9–10.3)
Chloride: 107 mmol/L (ref 98–111)
Chloride: 109 mmol/L (ref 98–111)
Creatinine, Ser: 1.34 mg/dL — ABNORMAL HIGH (ref 0.44–1.00)
Creatinine, Ser: 1.38 mg/dL — ABNORMAL HIGH (ref 0.44–1.00)
GFR calc Af Amer: 43 mL/min — ABNORMAL LOW (ref >60)
GFR calc Af Amer: 41 mL/min — ABNORMAL LOW (ref >60)
GFR calc non Af Amer: 37 mL/min — ABNORMAL LOW (ref >60)
GFR calc non Af Amer: 36 mL/min — ABNORMAL LOW (ref >60)
Glucose, Bld: 185 mg/dL — ABNORMAL HIGH (ref 70–99)
Glucose, Bld: 332 mg/dL — ABNORMAL HIGH (ref 70–99)
Potassium: 3.6 mmol/L (ref 3.5–5.1)
Potassium: 3.9 mmol/L (ref 3.5–5.1)
Sodium: 144 mmol/L (ref 135–145)
Sodium: 142 mmol/L (ref 135–145)

## 2020-04-20 LAB — VITAMIN B12: Vitamin B-12: 1182 pg/mL — ABNORMAL HIGH (ref 180–914)

## 2020-04-20 LAB — GLUCOSE, CAPILLARY
Glucose-Capillary: 148 mg/dL — ABNORMAL HIGH (ref 70–99)
Glucose-Capillary: 183 mg/dL — ABNORMAL HIGH (ref 70–99)
Glucose-Capillary: 190 mg/dL — ABNORMAL HIGH (ref 70–99)
Glucose-Capillary: 192 mg/dL — ABNORMAL HIGH (ref 70–99)
Glucose-Capillary: 227 mg/dL — ABNORMAL HIGH (ref 70–99)
Glucose-Capillary: 273 mg/dL — ABNORMAL HIGH (ref 70–99)
Glucose-Capillary: 305 mg/dL — ABNORMAL HIGH (ref 70–99)
Glucose-Capillary: 315 mg/dL — ABNORMAL HIGH (ref 70–99)
Glucose-Capillary: 371 mg/dL — ABNORMAL HIGH (ref 70–99)
Glucose-Capillary: 379 mg/dL — ABNORMAL HIGH (ref 70–99)
Glucose-Capillary: 401 mg/dL — ABNORMAL HIGH (ref 70–99)
Glucose-Capillary: 490 mg/dL — ABNORMAL HIGH (ref 70–99)

## 2020-04-20 LAB — FOLATE: Folate: 15.5 ng/mL (ref >5.9)

## 2020-04-20 LAB — PHOSPHORUS: Phosphorus: 1.7 mg/dL — ABNORMAL LOW (ref 2.5–4.6)

## 2020-04-20 MED ORDER — INSULIN GLARGINE 100 UNIT/ML ~~LOC~~ SOLN
15.0000 [IU] | Freq: Every day | SUBCUTANEOUS | Status: DC
  Administered 2020-04-20: 15 [IU] via SUBCUTANEOUS
  Filled 2020-04-20 (×3): qty 0.15

## 2020-04-20 MED ORDER — LACTATED RINGERS IV SOLN: INTRAVENOUS | Status: DC

## 2020-04-20 MED ORDER — HYDRALAZINE HCL 20 MG/ML IJ SOLN
10.0000 mg | INTRAMUSCULAR | Status: DC | PRN
  Administered 2020-04-20: 10 mg via INTRAVENOUS

## 2020-04-20 MED ORDER — LABETALOL HCL 5 MG/ML IV SOLN
10.0000 mg | INTRAVENOUS | Status: DC | PRN
  Administered 2020-04-21 (×2): 10 mg via INTRAVENOUS
  Filled 2020-04-20 (×2): qty 4

## 2020-04-20 MED ORDER — INSULIN GLARGINE 100 UNIT/ML ~~LOC~~ SOLN
25.0000 [IU] | Freq: Every day | SUBCUTANEOUS | Status: DC
  Administered 2020-04-21: 25 [IU] via SUBCUTANEOUS
  Filled 2020-04-20 (×3): qty 0.25

## 2020-04-20 MED ORDER — INSULIN GLARGINE 100 UNIT/ML ~~LOC~~ SOLN
10.0000 [IU] | Freq: Once | SUBCUTANEOUS | Status: AC
  Administered 2020-04-20: 10 [IU] via SUBCUTANEOUS
  Filled 2020-04-20: qty 0.1

## 2020-04-20 MED ORDER — HYDRALAZINE HCL 20 MG/ML IJ SOLN
INTRAMUSCULAR | Status: AC
  Filled 2020-04-20: qty 1

## 2020-04-20 MED ORDER — INSULIN ASPART 100 UNIT/ML ~~LOC~~ SOLN
0.0000 [IU] | Freq: Three times a day (TID) | SUBCUTANEOUS | Status: DC
  Administered 2020-04-20: 7 [IU] via SUBCUTANEOUS
  Administered 2020-04-20: 9 [IU] via SUBCUTANEOUS
  Administered 2020-04-21: 1 [IU] via SUBCUTANEOUS
  Administered 2020-04-21: 3 [IU] via SUBCUTANEOUS
  Administered 2020-04-21: 1 [IU] via SUBCUTANEOUS
  Administered 2020-04-21 – 2020-04-22 (×2): 7 [IU] via SUBCUTANEOUS
  Filled 2020-04-20 (×7): qty 1

## 2020-04-20 NOTE — ED Notes (Signed)
Sister to the bedside. Sister confirms that pt behavior is not her normal and that she is confused.

## 2020-04-20 NOTE — ED Notes (Signed)
VO per MD Girguis to give pt 15units Lantus. This Rn contacted pharmacy with VO.  Awaiting on medication at this time.

## 2020-04-20 NOTE — ED Notes (Signed)
Lab contacted by this Rn for blood draw.

## 2020-04-20 NOTE — Hospital Course (Addendum)
Ms. Vicki [name] is an 79 yo AA female with PMH CVA, vascular dementia, Alzheimer's disease, HTN, GERD, DMII who presented with AMS and weakness.  She had been recently hospitalized in [place] approximately 2 weeks ago due to issues of hypoglycemia.  She was found to be hyperglycemic on presentation to the ER and was briefly started on an insulin infusion.  Work-up at initial presentation was considered consistent with DKA.  DKA did resolve with fluids and insulin drip.  She was transitioned to subcutaneous insulin. Her mentation was initially poor but within 48 hours she had tremendously improved. She was awake, alert, and able to work with physical therapy.  She was recommended to have home health physical therapy at discharge which was arranged prior to discharge.

## 2020-04-20 NOTE — ED Notes (Signed)
Lab called for blood draw at this time.

## 2020-04-20 NOTE — ED Notes (Signed)
This assumed care of pt. Pt resting comfortably at this time with NAD noted.

## 2020-04-20 NOTE — ED Notes (Signed)
This Rn called Kelli Churn sister to provide an status update. No answer. This Rn will attempt again.

## 2020-04-20 NOTE — Progress Notes (Signed)
PROGRESS NOTE    Vicki [name]   ZOX:[id number]  DOB: 12/08/1940  DOA: 04/19/2020     1  PCP: Lynnea Ferrier, MD  CC: AMS, weakness  Hospital Course: Ms. Vessey is an 79 yo AA female with PMH CVA, vascular dementia, Alzheimer's disease, HTN, GERD, DMII who presented with AMS and weakness.  She had been recently hospitalized in [place] approximately 2 weeks ago due to issues of hypoglycemia.  She was found to be hyperglycemic on presentation to the ER and was briefly started on an insulin infusion.  Work-up at initial presentation was considered consistent with DKA.  DKA did resolve with fluids and insulin drip.  She was transitioned to subcutaneous insulin. Her mentation remained poor although with her underlying Alzheimer's and vascular dementia, baseline was not too far off.  She was considered to have a component of delirium as well on arrival.   Interval History:  Seen this morning in the ER still waiting her room.  She was somnolent and lethargic.  She did nod her head some appropriately to questions but would not talk much.  She was unable to provide any significant collateral information.  Old records reviewed in assessment of this patient  ROS: Review of systems not obtained due to patient factors.  Cognitive impairment  Assessment & Plan: Severe DKA in setting of type 1 diabetes: -DKA resolved.  Transitioned to subcutaneous insulins -Continue Lantus, SSI, and CBG monitoring -Further adjustments as needed -Change fluid to LR  Acute kidney injury: Due to dehydration in setting of DKA.  Continue IV fluids as above and repeat labs in a.m.  Hold home losartan. -Repeat BMP in a.m.  Hypernatremia: Due to significant dehydration.  S/p 2 L fluids in the ED.  Continue IV fluids and monitor labs as above. -Follow-up BMP  Hyperkalemia: K 5.6 on admission.  Anticipate this will improve with insulin infusion and IV fluids. -Improved with DKA treatment -BMP in  a.m.  Hypertension: Holding losartan with AKI.  BP currently stable, resume home hydralazine if needed.  CAD/history of CVA/hyperlipidemia: Continue aspirin 81 mg daily and statin.  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  Continue management as above.  Resume home Namenda, Aricept, Celexa.  Delirium and fall precautions.  Antimicrobials: None  DVT prophylaxis: HSQ Code Status: DNR Family Communication: None present Disposition Plan: Status is: Inpatient  Remains inpatient appropriate because:Persistent severe electrolyte disturbances, Unsafe d/c plan, IV treatments appropriate due to intensity of illness or inability to take PO and Inpatient level of care appropriate due to severity of illness   Dispo: The patient is from: SNF              Anticipated d/c is to: SNF              Anticipated d/c date is: 2 days              Patient currently is not medically stable to d/c.       Objective: Blood pressure (!) 201/73, pulse 80, temperature 98 F (36.7 C), temperature source Oral, resp. rate 15, height  (1.6 m), weight 59 kg, SpO2 100 %.  Examination: General appearance: Minimally responsive elderly woman laying in bed in no distress occasionally nodding her head to questions Head: Normocephalic, without obvious abnormality Eyes: EOMI Lungs: clear to auscultation bilaterally Heart: regular rate and rhythm and S1, S2 normal Abdomen: Obese, soft, nontender Extremities: Trace lower extremity edema Skin:  mobility and turgor normal Neurologic: Moves all 4 extremities.  Does not follow commands  Consultants:   None  Procedures:   None  Data Reviewed: I have personally reviewed following labs and imaging studies Results for orders placed or performed during the hospital encounter of 04/19/20 (from the past 24 hour(s))  Glucose, capillary     Status: Abnormal   Collection Time: 04/19/20  8:00 PM  Result  Value Ref Range   Glucose-Capillary >600 (HH) 70 - 99 mg/dL   Comment 1 Document in Chart   Glucose, capillary     Status: Abnormal   Collection Time: 04/19/20  8:45 PM  Result Value Ref Range   Glucose-Capillary >600 (HH) 70 - 99 mg/dL  Osmolality     Status: Abnormal   Collection Time: 04/19/20  8:46 PM  Result Value Ref Range   Osmolality 367 (HH) 275 - 295 mOsm/kg  Basic metabolic panel     Status: Abnormal   Collection Time: 04/19/20  8:46 PM  Result Value Ref Range   Sodium 139 135 - 145 mmol/L   Potassium 5.2 (H) 3.5 - 5.1 mmol/L   Chloride 96 (L) 98 - 111 mmol/L   CO2 7 (L) 22 - 32 mmol/L   Glucose, Bld 816 (HH) 70 - 99 mg/dL   BUN 44 (H) 8 - 23 mg/dL   Creatinine, Ser 6.96 (H) 0.44 - 1.00 mg/dL   Calcium 9.9 8.9 - 29.5 mg/dL   GFR calc non Af Amer 21 (L) >60 mL/min   GFR calc Af Amer 24 (L) >60 mL/min   Anion gap 36 (H) 5 - 15  Glucose, capillary     Status: Abnormal   Collection Time: 04/19/20  9:20 PM  Result Value Ref Range   Glucose-Capillary 579 (HH) 70 - 99 mg/dL   Comment 1 Document in Chart   Lactic acid, plasma     Status: Abnormal   Collection Time: 04/19/20  9:57 PM  Result Value Ref Range   Lactic Acid, Venous 9.5 (HH) 0.5 - 1.9 mmol/L  Glucose, capillary     Status: Abnormal   Collection Time: 04/19/20 10:18 PM  Result Value Ref Range   Glucose-Capillary 568 (HH) 70 - 99 mg/dL   Comment 1 Document in Chart   Glucose, capillary     Status: Abnormal   Collection Time: 04/19/20 10:57 PM  Result Value Ref Range   Glucose-Capillary 555 (HH) 70 - 99 mg/dL   Comment 1 Document in Chart   Glucose, capillary     Status: Abnormal   Collection Time: 04/19/20 11:43 PM  Result Value Ref Range   Glucose-Capillary 523 (HH) 70 - 99 mg/dL   Comment 1 Document in Chart   Glucose, capillary     Status: Abnormal   Collection Time: 04/20/20 12:15 AM  Result Value Ref Range   Glucose-Capillary 490 (H) 70 - 99 mg/dL   Comment 1 Document in Chart   Glucose,  capillary     Status: Abnormal   Collection Time: 04/20/20  1:24 AM  Result Value Ref Range   Glucose-Capillary 401 (H) 70 - 99 mg/dL   Comment 1 Document in Chart   Glucose, capillary     Status: Abnormal   Collection Time: 04/20/20  2:09 AM  Result Value Ref Range   Glucose-Capillary 371 (H) 70 - 99 mg/dL  Glucose, capillary     Status: Abnormal   Collection Time: 04/20/20  3:27 AM  Result Value Ref Range   Glucose-Capillary  305 (H) 70 - 99 mg/dL  Glucose, capillary     Status: Abnormal   Collection Time: 04/20/20  4:49 AM  Result Value Ref Range   Glucose-Capillary 273 (H) 70 - 99 mg/dL  Glucose, capillary     Status: Abnormal   Collection Time: 04/20/20  6:07 AM  Result Value Ref Range   Glucose-Capillary 190 (H) 70 - 99 mg/dL  Basic metabolic panel     Status: Abnormal   Collection Time: 04/20/20  7:12 AM  Result Value Ref Range   Sodium 144 135 - 145 mmol/L   Potassium 3.6 3.5 - 5.1 mmol/L   Chloride 107 98 - 111 mmol/L   CO2 26 22 - 32 mmol/L   Glucose, Bld 185 (H) 70 - 99 mg/dL   BUN 40 (H) 8 - 23 mg/dL   Creatinine, Ser 7.65 (H) 0.44 - 1.00 mg/dL   Calcium 9.4 8.9 - 46.5 mg/dL   GFR calc non Af Amer 37 (L) >60 mL/min   GFR calc Af Amer 43 (L) >60 mL/min   Anion gap 11 5 - 15  Folate, serum, performed at [facility] lab     Status: None   Collection Time: 04/20/20  7:12 AM  Result Value Ref Range   Folate 15.5 >5.9 ng/mL  Magnesium     Status: Abnormal   Collection Time: 04/20/20  7:12 AM  Result Value Ref Range   Magnesium 2.5 (H) 1.7 - 2.4 mg/dL  Phosphorus     Status: Abnormal   Collection Time: 04/20/20  7:12 AM  Result Value Ref Range   Phosphorus 1.7 (L) 2.5 - 4.6 mg/dL  CBC with Differential/Platelet     Status: Abnormal   Collection Time: 04/20/20  7:12 AM  Result Value Ref Range   WBC 24.8 (H) 4.0 - 10.5 K/uL   RBC 3.55 (L) 3.87 - 5.11 MIL/uL   Hemoglobin 10.9 (L) 12.0 - 15.0 g/dL   HCT 03.5 (L) 36 - 46 %   MCV 91.5 80.0 - 100.0 fL   MCH 30.7  26.0 - 34.0 pg   MCHC 33.5 30.0 - 36.0 g/dL   RDW 46.5 68.1 - 27.5 %   Platelets 260 150 - 400 K/uL   nRBC 0.0 0.0 - 0.2 %   Neutrophils Relative % 82 %   Neutro Abs 20.4 (H) 1.7 - 7.7 K/uL   Lymphocytes Relative 6 %   Lymphs Abs 1.5 0.7 - 4.0 K/uL   Monocytes Relative 9 %   Monocytes Absolute 2.2 (H) 0 - 1 K/uL   Eosinophils Relative 0 %   Eosinophils Absolute 0.0 0 - 0 K/uL   Basophils Relative 0 %   Basophils Absolute 0.0 0 - 0 K/uL   Immature Granulocytes 3 %   Abs Immature Granulocytes 0.61 (H) 0.00 - 0.07 K/uL  Vitamin B12     Status: Abnormal   Collection Time: 04/20/20  7:17 AM  Result Value Ref Range   Vitamin B-12 1,182 (H) 180 - 914 pg/mL  Glucose, capillary     Status: Abnormal   Collection Time: 04/20/20  7:23 AM  Result Value Ref Range   Glucose-Capillary 192 (H) 70 - 99 mg/dL  Glucose, capillary     Status: Abnormal   Collection Time: 04/20/20  8:23 AM  Result Value Ref Range   Glucose-Capillary 183 (H) 70 - 99 mg/dL  Glucose, capillary     Status: Abnormal   Collection Time: 04/20/20  9:20 AM  Result Value Ref  Range   Glucose-Capillary 148 (H) 70 - 99 mg/dL  Glucose, capillary     Status: Abnormal   Collection Time: 04/20/20  3:30 PM  Result Value Ref Range   Glucose-Capillary 379 (H) 70 - 99 mg/dL  Basic metabolic panel     Status: Abnormal   Collection Time: 04/20/20  5:37 PM  Result Value Ref Range   Sodium 142 135 - 145 mmol/L   Potassium 3.9 3.5 - 5.1 mmol/L   Chloride 109 98 - 111 mmol/L   CO2 20 (L) 22 - 32 mmol/L   Glucose, Bld 332 (H) 70 - 99 mg/dL   BUN 40 (H) 8 - 23 mg/dL   Creatinine, Ser 1.611.38 (H) 0.44 - 1.00 mg/dL   Calcium 8.8 (L) 8.9 - 10.3 mg/dL   GFR calc non Af Amer 36 (L) >60 mL/min   GFR calc Af Amer 41 (L) >60 mL/min   Anion gap 13 5 - 15  Glucose, capillary     Status: Abnormal   Collection Time: 04/20/20  5:45 PM  Result Value Ref Range   Glucose-Capillary 315 (H) 70 - 99 mg/dL    Recent Results (from the past 240 hour(s))   SARS Coronavirus 2 by RT PCR (hospital order, [facility]) Nasopharyngeal Nasopharyngeal Swab     Status: None   Collection Time: 04/19/20  7:07 PM   Specimen: Nasopharyngeal Swab  Result Value Ref Range Status   SARS Coronavirus 2 NEGATIVE NEGATIVE Final    Comment: (NOTE) SARS-CoV-2 target nucleic acids are NOT DETECTED.  The SARS-CoV-2 RNA is generally detectable in upper and lower respiratory specimens during the acute phase of infection. The lowest concentration of SARS-CoV-2 viral copies this assay can detect is 250 copies / mL. A negative result does not preclude SARS-CoV-2 infection and should not be used as the sole basis for treatment or other patient management decisions.  A negative result may occur with improper specimen collection / handling, submission of specimen other than nasopharyngeal swab, presence of viral mutation(s) within the areas targeted by this assay, and inadequate number of viral copies (<250 copies / mL). A negative result must be combined with clinical observations, patient history, and epidemiological information.  Fact Sheet for Patients:   BoilerBrush.com.cyhttps://www.fda.gov/media/136312/download  Fact Sheet for Healthcare Providers: https://pope.com/https://www.fda.gov/media/136313/download  This test is not yet approved or  cleared by the [place] FDA and has been authorized for detection and/or diagnosis of SARS-CoV-2 by FDA under an Emergency Use Authorization (EUA).  This EUA will remain in effect (meaning this test can be used) for the duration of the COVID-19 declaration under Section 564(b)(1) of the Act, 21 U.S.C. section 360bbb-3(b)(1), unless the authorization is terminated or revoked sooner.  Performed at [facility], [address] [place], [place] [address]      Radiology Studies: CT Head Wo Contrast  Result Date: 04/19/2020 CLINICAL DATA:  Altered mental status EXAM: CT HEAD WITHOUT CONTRAST TECHNIQUE: Contiguous  axial images were obtained from the base of the skull through the vertex without intravenous contrast. COMPARISON:  01/19/2020 FINDINGS: Brain: There is normal anatomic configuration of the brain. Mild parenchymal volume loss is commensurate with the patient's age. Moderate periventricular and subcortical white matter changes are present likely reflecting the sequela of small vessel ischemia. Borderline ventriculomegaly likely reflects the sequela of central atrophy. Remote infarct noted within the left subinsular white matter. No evidence of acute intracranial hemorrhage or infarct. No abnormal mass effect or midline shift. No  abnormal intra or extra-axial mass lesion or fluid collection. The cerebellum is unremarkable. Vascular: No asymmetric hyperdense vasculature at the skull base. Skull: Intact Sinuses/Orbits: The paranasal sinuses are clear. The orbits are unremarkable. Other: Mastoid air cells and middle ear cavities are clear. IMPRESSION: 1. No acute intracranial abnormality. 2. Moderate periventricular and subcortical white matter changes likely reflecting the sequela of small vessel ischemia. Remote left subinsular white matter infarct. Electronically Signed   By: Helyn Numbers MD   On: 04/19/2020 19:40   DG Chest Portable 1 View  Result Date: 04/19/2020 CLINICAL DATA:  Evaluate for pneumonia. Altered mental status and weakness. EXAM: PORTABLE CHEST 1 VIEW COMPARISON:  04/11/2020 FINDINGS: Normal heart size. No pleural effusion or edema. No airspace opacities identified. Chronic appearing left posterolateral rib deformities are again noted and appears similar IMPRESSION: No acute cardiopulmonary abnormalities. Electronically Signed   By: Signa Kell M.D.   On: 04/19/2020 19:20   CT Head Wo Contrast  Final Result    DG Chest Portable 1 View  Final Result      Scheduled Meds: . aspirin EC  81 mg Oral Daily  . citalopram  20 mg Oral Daily  . donepezil  5 mg Oral QHS  . heparin  5,000  Units Subcutaneous Q8H  . insulin aspart  0-9 Units Subcutaneous TID AC & HS  . insulin glargine  10 Units Subcutaneous Once  . [START ON 04/21/2020] insulin glargine  25 Units Subcutaneous Daily  . memantine  5 mg Oral BID  . pravastatin  40 mg Oral q1800   PRN Meds: acetaminophen **OR** acetaminophen, dextrose, hydrALAZINE, labetalol Continuous Infusions: . lactated ringers 75 mL/hr at 04/20/20 1853      LOS: 1 day  Time spent: Greater than 50% of the 35 minute visit was spent in counseling/coordination of care for the patient as laid out in the A&P.   Lewie Chamber, MD [facility] 04/20/2020, 7:11 PM  Contact via secure chat.  To contact the attending provider between 7A-7P or the covering provider during after hours 7P-7A, please log into the web site [email or web address] and access using universal [place] password for that web site. If you do not have the password, please call the hospital operator.

## 2020-04-20 NOTE — ED Notes (Signed)
Kelli Churn (sister) [phone].

## 2020-04-20 NOTE — Progress Notes (Signed)
Inpatient Diabetes Program Recommendations  AACE/ADA: New Consensus Statement on Inpatient Glycemic Control (2015)  Target Ranges:  Prepandial:   less than 140 mg/dL      Peak postprandial:   less than 180 mg/dL (1-2 hours)      Critically ill patients:  140 - 180 mg/dL   Lab Results  Component Value Date   GLUCAP 148 (H) 04/20/2020   HGBA1C 11.5 (H) 01/20/2020    Review of Glycemic Control  Diabetes history: DM1 Outpatient Diabetes medications: Lantus 30 units + Humalog 4 units tid meal coverage Current orders for Inpatient glycemic control: Lantus 15 units + Novolog sensitive 0-9 units qid  Inpatient Diabetes Program Recommendations:   Please consider: -Increase Lantus 25 units QHS  -Novolog 3 units TID with meals for meal coverage if patient eats at least 50% of meals. -Change hs correction to 0-5 Secure chat sent to Dr Dedra Skeens with recommendations @ 10:09 am.  Patient has been seen by diabetes coordinators multiple times in the past with reminders of taking Insulin and diabetes management. Patient has been a patient of Dr. Pricilla Handler but note from Dr. Tedd Sias on 04/06/20 states patient is being dismissed from department.  Thank you, [name]. Nemesio Castrillon, RN, MSN, CDE  Diabetes Coordinator Inpatient Glycemic Control Team Team Pager [phone] (8am-5pm) 04/20/2020 10:06 AM

## 2020-04-20 NOTE — ED Notes (Signed)
Patient is resting comfortably. 

## 2020-04-20 NOTE — ED Notes (Signed)
Pt on the commode. Pt is now confused and cursing at staff. Pt is difficult to get off the commode due to her confusion. Pt was not this way prior to going to the bathroom.

## 2020-04-21 DIAGNOSIS — N179 Acute kidney failure, unspecified: Secondary | ICD-10-CM

## 2020-04-21 LAB — CBC WITH DIFFERENTIAL/PLATELET
Abs Immature Granulocytes: 0.18 10*3/uL — ABNORMAL HIGH (ref 0.00–0.07)
Basophils Absolute: 0 10*3/uL (ref 0.0–0.1)
Basophils Relative: 0 %
Eosinophils Absolute: 0 10*3/uL (ref 0.0–0.5)
Eosinophils Relative: 0 %
HCT: 32.8 % — ABNORMAL LOW (ref 36.0–46.0)
Hemoglobin: 10.8 g/dL — ABNORMAL LOW (ref 12.0–15.0)
Immature Granulocytes: 1 %
Lymphocytes Relative: 8 %
Lymphs Abs: 1.7 10*3/uL (ref 0.7–4.0)
MCH: 30.9 pg (ref 26.0–34.0)
MCHC: 32.9 g/dL (ref 30.0–36.0)
MCV: 93.7 fL (ref 80.0–100.0)
Monocytes Absolute: 1.2 10*3/uL — ABNORMAL HIGH (ref 0.1–1.0)
Monocytes Relative: 6 %
Neutro Abs: 17.6 10*3/uL — ABNORMAL HIGH (ref 1.7–7.7)
Neutrophils Relative %: 85 %
Platelets: 237 10*3/uL (ref 150–400)
RBC: 3.5 MIL/uL — ABNORMAL LOW (ref 3.87–5.11)
RDW: 14 % (ref 11.5–15.5)
WBC: 20.7 10*3/uL — ABNORMAL HIGH (ref 4.0–10.5)
nRBC: 0 % (ref 0.0–0.2)

## 2020-04-21 LAB — BASIC METABOLIC PANEL
Anion gap: 9 (ref 5–15)
BUN: 38 mg/dL — ABNORMAL HIGH (ref 8–23)
CO2: 26 mmol/L (ref 22–32)
Calcium: 8.6 mg/dL — ABNORMAL LOW (ref 8.9–10.3)
Chloride: 110 mmol/L (ref 98–111)
Creatinine, Ser: 1.09 mg/dL — ABNORMAL HIGH (ref 0.44–1.00)
GFR calc Af Amer: 55 mL/min — ABNORMAL LOW (ref >60)
GFR calc non Af Amer: 48 mL/min — ABNORMAL LOW (ref >60)
Glucose, Bld: 193 mg/dL — ABNORMAL HIGH (ref 70–99)
Potassium: 3.4 mmol/L — ABNORMAL LOW (ref 3.5–5.1)
Sodium: 145 mmol/L (ref 135–145)

## 2020-04-21 LAB — GLUCOSE, CAPILLARY
Glucose-Capillary: 152 mg/dL — ABNORMAL HIGH (ref 70–99)
Glucose-Capillary: 303 mg/dL — ABNORMAL HIGH (ref 70–99)
Glucose-Capillary: 221 mg/dL — ABNORMAL HIGH (ref 70–99)
Glucose-Capillary: 127 mg/dL — ABNORMAL HIGH (ref 70–99)
Glucose-Capillary: 79 mg/dL (ref 70–99)

## 2020-04-21 LAB — HEMOGLOBIN A1C
Hgb A1c MFr Bld: 13 % — ABNORMAL HIGH (ref 4.8–5.6)
Mean Plasma Glucose: 326 mg/dL

## 2020-04-21 LAB — MAGNESIUM: Magnesium: 2.1 mg/dL (ref 1.7–2.4)

## 2020-04-21 MED ORDER — LOSARTAN POTASSIUM 50 MG PO TABS
50.0000 mg | ORAL_TABLET | Freq: Every day | ORAL | Status: DC
  Administered 2020-04-21: 50 mg via ORAL
  Filled 2020-04-21: qty 1

## 2020-04-21 MED ORDER — HYDRALAZINE HCL 50 MG PO TABS
50.0000 mg | ORAL_TABLET | Freq: Three times a day (TID) | ORAL | Status: DC
  Administered 2020-04-21 – 2020-04-22 (×4): 50 mg via ORAL
  Filled 2020-04-21 (×4): qty 1

## 2020-04-21 NOTE — Progress Notes (Signed)
Patient Bp elevated, Given PRN labetalol IV , Notified Dr Frederick Peers    04/21/20 1846  Vitals  Temp 97.7 F (36.5 C)  Temp Source Oral  BP (!) 184/75  MAP (mmHg) 106  BP Location Left Arm  BP Method Automatic  Patient Position (if appropriate) Lying  Pulse Rate 77  Resp 16  MEWS COLOR  MEWS Score Color Green  Oxygen Therapy  SpO2 99 %  O2 Device Room Air  MEWS Score  MEWS Temp 0  MEWS Systolic 0  MEWS Pulse 0  MEWS RR 0  MEWS LOC 0  MEWS Score 0

## 2020-04-21 NOTE — ED Notes (Signed)
Transporting pt to room 144

## 2020-04-21 NOTE — ED Notes (Signed)
Patient bedding and gown changed. Patient cleaned with warm wipes

## 2020-04-21 NOTE — Evaluation (Signed)
Physical Therapy Evaluation Patient Details Name: Vicki [name] MRN: [id number] DOB: 06/03/1941 Today's Date: 04/21/2020   History of Present Illness  79 y.o. her for DKA and AMS. PMH includes DM, htn, R sided lesion of sciatic nerve, stroke, Alzheimer's dementia, DKA, NSTEMI, h/o breast surgery.  Clinical Impression  Pt did well with PT exam, she was able to do multiple sit to stand efforts w/o physical assist, used the rest room with appropriate clean up and was able to ambulate ~75 ft with walker.  She did have slow and walker reliant gait, along with a few stagger steps that she was able to self arrest but that were concerning.  She had long standing R LE weakness pain, but has had persistent L sided pain since a fall on the ice last winter (this PT saw her a few months ago and she was complaining of the same at that time, she reports she has seen the MD and they have found no fracture, etc).  Pt does have 24/7 assist at home, will benefit from HHPT and this PT highly encouraged her to maintain on the RW and not try to transition back to cane until she has worked with PT on this at home.  Pt very pleasant and willing to participate with PT exam.    Follow Up Recommendations Home health PT;Supervision for mobility/OOB    Equipment Recommendations  None recommended by PT    Recommendations for Other Services       Precautions / Restrictions Precautions Precautions: Fall Restrictions Weight Bearing Restrictions: No      Mobility  Bed Mobility Overal bed mobility: Modified Independent             General bed mobility comments: Pt did well with getting to EOB, light reliance on UEs but good confidence and QofM  Transfers Overall transfer level: Modified independent Equipment used: Rolling walker (2 wheeled)             General transfer comment: 4 x sit to stand t/o session, able to rise w/o direct assist but did have definite heavy UE  use  Ambulation/Gait Ambulation/Gait assistance: Min guard;Min assist Gait Distance (Feet): 75 Feet Assistive device: Rolling walker (2 wheeled)       General Gait Details: Pt showed good effort with ambulation, she did have a few stagger stepping episodes that did not require direct assist, but were close.  She showed great effort and O2 remained in the mid/high 90s and HR 70s-90s t/o the effort.  Pt had baseline limp on the R but had stagger stepping(almost buckling) at time on the L.  Plenty of cuing to maintain in walker and stay focused on posture and directional cues to stay focused on task.  Stairs            Wheelchair Mobility    Modified Rankin (Stroke Patients Only)       Balance Overall balance assessment: Modified Independent                                           Pertinent Vitals/Pain Pain Assessment: Faces Faces Pain Scale: Hurts a little bit Pain Location: reports chronic R leg pain and subacute (falling on ice ~9 months ago) L hip pain    Home Living Family/patient expects to be discharged to:: Private residence Living Arrangements: Other relatives (cousin) Available Help at Discharge: Family;Available PRN/intermittently (  sister who works here at [facility])   Home Access: Stairs to enter Entrance Stairs-Rails: Doctor, general practice of Steps: 3 Home Layout: One level Home Equipment: Gilmer Mor - single point;Shower seat;Grab bars - tub/shower;Hand held Careers information officer - 2 wheels      Prior Function Level of Independence: Independent with assistive device(s);Needs assistance   Gait / Transfers Assistance Needed: Pt reports that she is able to get around with [facility] (walking to mailbox, light errands when others drive, etc)  ADL's / Homemaking Assistance Needed: minimal assist from cousin, reports that she does most of her ADLs, etc on her own        Hand Dominance        Extremity/Trunk Assessment   Upper Extremity  Assessment Upper Extremity Assessment: Generalized weakness;Overall WFL for tasks assessed (age appropriate defecits)    Lower Extremity Assessment Lower Extremity Assessment: Overall WFL for tasks assessed;Generalized weakness (R LE grossly 4-/5, L grossly 4/5)       Communication   Communication: No difficulties  Cognition Arousal/Alertness: Awake/alert Behavior During Therapy: WFL for tasks assessed/performed Overall Cognitive Status: History of cognitive impairments - at baseline                                 General Comments: Pt alert to self, knew month, year, setting (hospital/[place]) but had some general confusion      General Comments General comments (skin integrity, edema, etc.): Pt generally did well, she did have a few stagger steps but did not need direct assist to maintain balance during functional (use of bathroom) and 75 ft of ambulation tasks    Exercises     Assessment/Plan    PT Assessment Patient needs continued PT services  PT Problem List Decreased strength;Decreased range of motion;Decreased balance;Decreased activity tolerance;Decreased mobility;Decreased knowledge of use of DME;Decreased safety awareness;Decreased cognition;Decreased coordination       PT Treatment Interventions DME instruction;Gait training;Stair training;Functional mobility training;Therapeutic activities;Therapeutic exercise;Balance training;Neuromuscular re-education;Patient/family education;Cognitive remediation    PT Goals (Current goals can be found in the Care Plan section)  Acute Rehab PT Goals Patient Stated Goal: go home PT Goal Formulation: With patient Time For Goal Achievement: 05/05/20 Potential to Achieve Goals: Good    Frequency Min 2X/week   Barriers to discharge        Co-evaluation               AM-PAC PT "6 Clicks" Mobility  Outcome Measure Help needed turning from your back to your side while in a flat bed without using  bedrails?: None Help needed moving from lying on your back to sitting on the side of a flat bed without using bedrails?: None Help needed moving to and from a bed to a chair (including a wheelchair)?: A Little Help needed standing up from a chair using your arms (e.g., wheelchair or bedside chair)?: A Little Help needed to walk in hospital room?: A Little Help needed climbing 3-5 steps with a railing? : A Little 6 Click Score: 20    End of Session Equipment Utilized During Treatment: Gait belt Activity Tolerance: Patient tolerated treatment well;Patient limited by fatigue Patient left: with call bell/phone within reach;with chair alarm set Nurse Communication: Mobility status PT Visit Diagnosis: Muscle weakness (generalized) (M62.81);Difficulty in walking, not elsewhere classified (R26.2);Unsteadiness on feet (R26.81)    Time: 4098-1191 PT Time Calculation (min) (ACUTE ONLY): 26 min   Charges:   PT Evaluation $PT  Eval Low Complexity: 1 Low PT Treatments $Gait Training: 8-22 mins        Malachi Pro, DPT 04/21/2020, 4:35 PM

## 2020-04-21 NOTE — ED Notes (Signed)
Pt woke but denied needing anything at this time. IV site changed so patient could bend her arm for comfort. Otherwise pt in NAD at this time. Lights dimmed and call bell in reach.

## 2020-04-21 NOTE — Progress Notes (Signed)
PROGRESS NOTE    Ronita HippsRoberta Lee Meyering   ZOX:[id number]  DOB: [birth year]  DOA: 04/19/2020     2  PCP: Lynnea FerrierKlein, Bert J III, MD  CC: AMS, weakness  Hospital Course: Ms. Arlana Pouchate is an 79 yo AA female with PMH CVA, vascular dementia, Alzheimer's disease, HTN, GERD, DMII who presented with AMS and weakness.  She had been recently hospitalized in [place] approximately 2 weeks ago due to issues of hypoglycemia.  She was found to be hyperglycemic on presentation to the ER and was briefly started on an insulin infusion.  Work-up at initial presentation was considered consistent with DKA.  DKA did resolve with fluids and insulin drip.  She was transitioned to subcutaneous insulin. Her mentation remained poor although with her underlying Alzheimer's and vascular dementia, baseline was not too far off.  She was considered to have a component of delirium as well on arrival. Due to her ongoing weakness, PT was consulted for further evaluation.    Interval History:  She is more awake and alert this morning.  Able to answer questions appropriately this morning.  Still endorsing feeling very weak all over.  She lives at home prior to hospitalization and was worried about going home so weak.  Recommended that we would have PT evaluate her to see if she is strong enough for discharging home versus needing rehab.  She otherwise had no other symptoms or concerns this morning when seen.  Old records reviewed in assessment of this patient  ROS: Review of systems not obtained due to patient factors.  Cognitive impairment  Assessment & Plan: Severe DKA in setting of type 1 diabetes - resolved -DKA resolved.  Transitioned to subcutaneous insulins -Continue Lantus, SSI, and CBG monitoring -Further adjustments as needed -Change fluid to LR  Acute kidney injury, improving  Due to dehydration in setting of DKA.  Continue IV fluids as above and repeat labs in a.m.  Hold home losartan. -Repeat BMP in  a.m.  Hyponatremia, resolved  Due to significant dehydration.  S/p 2 L fluids in the ED.  Continue IV fluids and monitor labs as above. -Follow-up BMP  Hyperkalemia: K 5.6 on admission.  Anticipate this will improve with insulin infusion and IV fluids. -Improved with DKA treatment -BMP in a.m.  Hypokalemia - replete and recheck as needed  Hypertension: -Resume home meds -Continue labetalol and hydralazine as needed  CAD/history of CVA/hyperlipidemia: Continue aspirin 81 mg daily and statin.  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  Continue management as above.  Resume home Namenda, Aricept, Celexa.  Delirium and fall precautions.  Antimicrobials: None  DVT prophylaxis: HSQ Code Status: DNR Family Communication: None present Disposition Plan: Status is: Inpatient  Remains inpatient appropriate because:Persistent severe electrolyte disturbances, Unsafe d/c plan, IV treatments appropriate due to intensity of illness or inability to take PO and Inpatient level of care appropriate due to severity of illness   Dispo: The patient is from: SNF              Anticipated d/c is to: SNF              Anticipated d/c date is: 2 days              Patient currently is not medically stable to d/c.  Objective: Blood pressure (!) 163/82, pulse 76, temperature 98.2 F (36.8 C), temperature source Oral, resp. rate 18, height 5\' 3"  (1.6 m), weight 59 kg, SpO2 100 %.  Examination: General appearance: Pleasant and lethargic appearing elderly woman lying in bed more awake and alert compared to yesterday Head: Normocephalic, without obvious abnormality Eyes: EOMI Lungs: clear to auscultation bilaterally Heart: regular rate and rhythm and S1, S2 normal Abdomen: Obese, soft, nontender Extremities: Trace lower extremity edema Skin: mobility and turgor normal Neurologic: Diffusely weak but no focal deficits  Consultants:    None  Procedures:   None  Data Reviewed: I have personally reviewed following labs and imaging studies Results for orders placed or performed during the hospital encounter of 04/19/20 (from the past 24 hour(s))  Glucose, capillary     Status: Abnormal   Collection Time: 04/20/20  3:30 PM  Result Value Ref Range   Glucose-Capillary 379 (H) 70 - 99 mg/dL  Basic metabolic panel     Status: Abnormal   Collection Time: 04/20/20  5:37 PM  Result Value Ref Range   Sodium 142 135 - 145 mmol/L   Potassium 3.9 3.5 - 5.1 mmol/L   Chloride 109 98 - 111 mmol/L   CO2 20 (L) 22 - 32 mmol/L   Glucose, Bld 332 (H) 70 - 99 mg/dL   BUN 40 (H) 8 - 23 mg/dL   Creatinine, Ser 1.94 (H) 0.44 - 1.00 mg/dL   Calcium 8.8 (L) 8.9 - 10.3 mg/dL   GFR calc non Af Amer 36 (L) >60 mL/min   GFR calc Af Amer 41 (L) >60 mL/min   Anion gap 13 5 - 15  Glucose, capillary     Status: Abnormal   Collection Time: 04/20/20  5:45 PM  Result Value Ref Range   Glucose-Capillary 315 (H) 70 - 99 mg/dL  Glucose, capillary     Status: Abnormal   Collection Time: 04/20/20  8:32 PM  Result Value Ref Range   Glucose-Capillary 227 (H) 70 - 99 mg/dL  Glucose, capillary     Status: Abnormal   Collection Time: 04/21/20  1:37 AM  Result Value Ref Range   Glucose-Capillary 152 (H) 70 - 99 mg/dL  Basic metabolic panel     Status: Abnormal   Collection Time: 04/21/20  5:01 AM  Result Value Ref Range   Sodium 145 135 - 145 mmol/L   Potassium 3.4 (L) 3.5 - 5.1 mmol/L   Chloride 110 98 - 111 mmol/L   CO2 26 22 - 32 mmol/L   Glucose, Bld 193 (H) 70 - 99 mg/dL   BUN 38 (H) 8 - 23 mg/dL   Creatinine, Ser 1.74 (H) 0.44 - 1.00 mg/dL   Calcium 8.6 (L) 8.9 - 10.3 mg/dL   GFR calc non Af Amer 48 (L) >60 mL/min   GFR calc Af Amer 55 (L) >60 mL/min   Anion gap 9 5 - 15  CBC with Differential/Platelet     Status: Abnormal   Collection Time: 04/21/20  5:01 AM  Result Value Ref Range   WBC 20.7 (H) 4.0 - 10.5 K/uL   RBC 3.50 (L)  3.87 - 5.11 MIL/uL   Hemoglobin 10.8 (L) 12.0 - 15.0 g/dL   HCT 08.1 (L) 36 - 46 %   MCV 93.7 80.0 - 100.0 fL   MCH 30.9 26.0 - 34.0 pg   MCHC 32.9 30.0 - 36.0 g/dL   RDW 44.8 18.5 - 63.1 %   Platelets 237 150 - 400 K/uL   nRBC 0.0 0.0 - 0.2 %   Neutrophils Relative % 85 %   Neutro Abs 17.6 (H) 1.7 - 7.7 K/uL   Lymphocytes Relative  8 %   Lymphs Abs 1.7 0.7 - 4.0 K/uL   Monocytes Relative 6 %   Monocytes Absolute 1.2 (H) 0 - 1 K/uL   Eosinophils Relative 0 %   Eosinophils Absolute 0.0 0 - 0 K/uL   Basophils Relative 0 %   Basophils Absolute 0.0 0 - 0 K/uL   Immature Granulocytes 1 %   Abs Immature Granulocytes 0.18 (H) 0.00 - 0.07 K/uL  Magnesium     Status: None   Collection Time: 04/21/20  5:01 AM  Result Value Ref Range   Magnesium 2.1 1.7 - 2.4 mg/dL  Glucose, capillary     Status: Abnormal   Collection Time: 04/21/20 10:50 AM  Result Value Ref Range   Glucose-Capillary 303 (H) 70 - 99 mg/dL   Comment 1 Notify RN    Comment 2 Document in Chart   Glucose, capillary     Status: Abnormal   Collection Time: 04/21/20  1:45 PM  Result Value Ref Range   Glucose-Capillary 221 (H) 70 - 99 mg/dL    Recent Results (from the past 240 hour(s))  SARS Coronavirus 2 by RT PCR (hospital order, performed in [facility] hospital lab) Nasopharyngeal Nasopharyngeal Swab     Status: None   Collection Time: 04/19/20  7:07 PM   Specimen: Nasopharyngeal Swab  Result Value Ref Range Status   SARS Coronavirus 2 NEGATIVE NEGATIVE Final    Comment: (NOTE) SARS-CoV-2 target nucleic acids are NOT DETECTED.  The SARS-CoV-2 RNA is generally detectable in upper and lower respiratory specimens during the acute phase of infection. The lowest concentration of SARS-CoV-2 viral copies this assay can detect is 250 copies / mL. A negative result does not preclude SARS-CoV-2 infection and should not be used as the sole basis for treatment or other patient management decisions.  A negative result may occur  with improper specimen collection / handling, submission of specimen other than nasopharyngeal swab, presence of viral mutation(s) within the areas targeted by this assay, and inadequate number of viral copies (<250 copies / mL). A negative result must be combined with clinical observations, patient history, and epidemiological information.  Fact Sheet for Patients:   BoilerBrush.com.cy  Fact Sheet for Healthcare Providers: https://pope.com/  This test is not yet approved or  cleared by the [place] FDA and has been authorized for detection and/or diagnosis of SARS-CoV-2 by FDA under an Emergency Use Authorization (EUA).  This EUA will remain in effect (meaning this test can be used) for the duration of the COVID-19 declaration under Section 564(b)(1) of the Act, 21 U.S.C. section 360bbb-3(b)(1), unless the authorization is terminated or revoked sooner.  Performed at [facility], [address] [place], [place] [address]      Radiology Studies: CT Head Wo Contrast  Result Date: 04/19/2020 CLINICAL DATA:  Altered mental status EXAM: CT HEAD WITHOUT CONTRAST TECHNIQUE: Contiguous axial images were obtained from the base of the skull through the vertex without intravenous contrast. COMPARISON:  01/19/2020 FINDINGS: Brain: There is normal anatomic configuration of the brain. Mild parenchymal volume loss is commensurate with the patient's age. Moderate periventricular and subcortical white matter changes are present likely reflecting the sequela of small vessel ischemia. Borderline ventriculomegaly likely reflects the sequela of central atrophy. Remote infarct noted within the left subinsular white matter. No evidence of acute intracranial hemorrhage or infarct. No abnormal mass effect or midline shift. No abnormal intra or extra-axial mass lesion or fluid collection. The cerebellum is unremarkable. Vascular: No asymmetric  hyperdense vasculature at the skull base. Skull: Intact Sinuses/Orbits: The paranasal sinuses are clear. The orbits are unremarkable. Other: Mastoid air cells and middle ear cavities are clear. IMPRESSION: 1. No acute intracranial abnormality. 2. Moderate periventricular and subcortical white matter changes likely reflecting the sequela of small vessel ischemia. Remote left subinsular white matter infarct. Electronically Signed   By: Helyn Numbers MD   On: 04/19/2020 19:40   DG Chest Portable 1 View  Result Date: 04/19/2020 CLINICAL DATA:  Evaluate for pneumonia. Altered mental status and weakness. EXAM: PORTABLE CHEST 1 VIEW COMPARISON:  04/11/2020 FINDINGS: Normal heart size. No pleural effusion or edema. No airspace opacities identified. Chronic appearing left posterolateral rib deformities are again noted and appears similar IMPRESSION: No acute cardiopulmonary abnormalities. Electronically Signed   By: Signa Kell M.D.   On: 04/19/2020 19:20   CT Head Wo Contrast  Final Result    DG Chest Portable 1 View  Final Result      Scheduled Meds: . aspirin EC  81 mg Oral Daily  . citalopram  20 mg Oral Daily  . donepezil  5 mg Oral QHS  . heparin  5,000 Units Subcutaneous Q8H  . hydrALAZINE  50 mg Oral TID  . insulin aspart  0-9 Units Subcutaneous TID AC & HS  . insulin glargine  25 Units Subcutaneous Daily  . losartan  50 mg Oral QHS  . memantine  5 mg Oral BID  . pravastatin  40 mg Oral q1800   PRN Meds: acetaminophen **OR** acetaminophen, dextrose, hydrALAZINE, labetalol Continuous Infusions: . lactated ringers 75 mL/hr at 04/21/20 1506      LOS: 2 days  Time spent: Greater than 50% of the 35 minute visit was spent in counseling/coordination of care for the patient as laid out in the A&P.   Lewie Chamber, MD [facility] 04/21/2020, 3:15 PM  Contact via secure chat.  To contact the attending provider between 7A-7P or the covering provider during after hours 7P-7A,  please log into the web site [email or web address] and access using universal  password for that web site. If you do not have the password, please call the hospital operator.

## 2020-04-21 NOTE — ED Notes (Signed)
Pt able to ambulate to bedside toilet with assistance.

## 2020-04-22 DIAGNOSIS — E1010 Type 1 diabetes mellitus with ketoacidosis without coma: Principal | ICD-10-CM

## 2020-04-22 LAB — CBC WITH DIFFERENTIAL/PLATELET
Abs Immature Granulocytes: 0.05 10*3/uL (ref 0.00–0.07)
Basophils Absolute: 0 10*3/uL (ref 0.0–0.1)
Basophils Relative: 0 %
Eosinophils Absolute: 0.1 10*3/uL (ref 0.0–0.5)
Eosinophils Relative: 1 %
HCT: 32.1 % — ABNORMAL LOW (ref 36.0–46.0)
Hemoglobin: 11 g/dL — ABNORMAL LOW (ref 12.0–15.0)
Immature Granulocytes: 0 %
Lymphocytes Relative: 18 %
Lymphs Abs: 2.1 10*3/uL (ref 0.7–4.0)
MCH: 30.8 pg (ref 26.0–34.0)
MCHC: 34.3 g/dL (ref 30.0–36.0)
MCV: 89.9 fL (ref 80.0–100.0)
Monocytes Absolute: 1 10*3/uL (ref 0.1–1.0)
Monocytes Relative: 9 %
Neutro Abs: 8.3 10*3/uL — ABNORMAL HIGH (ref 1.7–7.7)
Neutrophils Relative %: 72 %
Platelets: 183 10*3/uL (ref 150–400)
RBC: 3.57 MIL/uL — ABNORMAL LOW (ref 3.87–5.11)
RDW: 14 % (ref 11.5–15.5)
WBC: 11.5 10*3/uL — ABNORMAL HIGH (ref 4.0–10.5)
nRBC: 0 % (ref 0.0–0.2)

## 2020-04-22 LAB — BASIC METABOLIC PANEL
Anion gap: 7 (ref 5–15)
BUN: 26 mg/dL — ABNORMAL HIGH (ref 8–23)
CO2: 29 mmol/L (ref 22–32)
Calcium: 8.6 mg/dL — ABNORMAL LOW (ref 8.9–10.3)
Chloride: 106 mmol/L (ref 98–111)
Creatinine, Ser: 0.77 mg/dL (ref 0.44–1.00)
GFR calc Af Amer: >60 mL/min (ref >60)
GFR calc non Af Amer: >60 mL/min (ref >60)
Glucose, Bld: 64 mg/dL — ABNORMAL LOW (ref 70–99)
Potassium: 3.1 mmol/L — ABNORMAL LOW (ref 3.5–5.1)
Sodium: 142 mmol/L (ref 135–145)

## 2020-04-22 LAB — GLUCOSE, CAPILLARY
Glucose-Capillary: 118 mg/dL — ABNORMAL HIGH (ref 70–99)
Glucose-Capillary: 340 mg/dL — ABNORMAL HIGH (ref 70–99)

## 2020-04-22 LAB — MAGNESIUM: Magnesium: 2 mg/dL (ref 1.7–2.4)

## 2020-04-22 MED ORDER — ACETAMINOPHEN 500 MG PO TABS: 1000.0000 mg | ORAL_TABLET | Freq: Four times a day (QID) | ORAL | 0 refills | Status: AC | PRN

## 2020-04-22 MED ORDER — POTASSIUM CHLORIDE CRYS ER 20 MEQ PO TBCR
40.0000 meq | EXTENDED_RELEASE_TABLET | Freq: Once | ORAL | Status: AC
  Administered 2020-04-22: 40 meq via ORAL
  Filled 2020-04-22: qty 2

## 2020-04-22 NOTE — Progress Notes (Signed)
   04/22/20 0700  Clinical Encounter Type  Visited With Family  Visit Type Initial  Referral From Chaplain  Consult/Referral To Chaplain  Walking back to chaplain's suite, chaplain saw pt's sister and she told chaplain her sister was back in the hospital. Chaplain stopped in and spoke with pt. Pt said she woke up in the hospital several days ago, confused about where she was. Pt said that she is feeling much better. She also mention that a younger sister recently died and she hadn't been properly eating. Chaplain prayed with pt and when nurse came in to take vitals, chaplain left. Chaplain will follow up with pt later.

## 2020-04-22 NOTE — TOC Transition Note (Signed)
Transition of Care [facility]) - CM/SW Discharge Note   Patient Details  Name: Vicki [name] MRN: [id number] Date of Birth: 1940/11/17  Transition [facility]) CM/SW Contact:  Maud Deed, LCSW Phone Number: 04/22/2020, 11:39 AM   Clinical Narrative:    Pt medically stable for discharge per MD. Pt Will be transported home by her Sister. Pt has all necessary DME. CSW provided referral to [place] with [facility] and they were able to accept. Pt will be followed for [facility] PT.     Final next level of care: Home w Home Health Services Barriers to Discharge: No Barriers Identified   Patient Goals and CMS Choice        Discharge Placement                Patient to be transferred to facility by: sister Name of family member notified: Salie Patient and family notified of of transfer: 04/22/20  Discharge Plan and Services                          HH Arranged: PT [facility] Agency: [facility] Date [facility] Agency Contacted: 04/22/20 Time HH Agency Contacted: [phone] Representative spoke with at [facility] Agency: Elnita Maxwell  Social Determinants of Health (SDOH) Interventions     Readmission Risk Interventions Readmission Risk Prevention Plan 01/20/2020 10/15/2019  Transportation Screening Complete Complete  PCP or Specialist Appt within 3-5 Days Complete Complete  HRI or Home Care Consult - Complete  Social Work Consult for Recovery Care Planning/Counseling Complete -  Palliative Care Screening Not Applicable -  Medication Review Ocean[employer]) Complete Complete  Some recent data might be hidden

## 2020-04-22 NOTE — Discharge Summary (Signed)
Physician Discharge Summary  Vicki [name] UEA:[id number] DOB: June 14, 1941 DOA: 04/19/2020  PCP: Lynnea Ferrier, MD  Admit date: 04/19/2020 Discharge date: 04/22/2020  Admitted From: home Disposition:  home Discharging physician: Lewie Chamber, MD  Recommendations for Outpatient Follow-up:  1. Continue with routine outpatient care  Home Health: PT  Patient discharged to home in Discharge Condition: stable CODE STATUS: DNR Diet recommendation:  Diet Orders (From admission, onward)    Start     Ordered   04/22/20 0000  Diet - low sodium heart healthy        04/22/20 1005   04/20/20 0934  Diet Carb Modified Fluid consistency: Thin; Room service appropriate? Yes  Diet effective now       Question Answer Comment  Diet-HS Snack? Nothing   Calorie Level Medium 1600-2000   Fluid consistency: Thin   Room service appropriate? Yes      04/20/20 0933          Hospital Course: Vicki [name] is an 79 yo AA female with PMH CVA, vascular dementia, Alzheimer's disease, HTN, GERD, DMII who presented with AMS and weakness.  She had been recently hospitalized in [place] approximately 2 weeks ago due to issues of hypoglycemia.  She was found to be hyperglycemic on presentation to the ER and was briefly started on an insulin infusion.  Work-up at initial presentation was considered consistent with DKA.  DKA did resolve with fluids and insulin drip.  She was transitioned to subcutaneous insulin. Her mentation was initially poor but within 48 hours she had tremendously improved. She was awake, alert, and able to work with physical therapy.  She was recommended to have home health physical therapy at discharge which was arranged prior to discharge.   Severe DKA in setting of type 1 diabetes - resolved -DKA resolved.  Transitioned to subcutaneous insulins -Continue Lantus, SSI, and CBG monitoring  Acute kidney injury, improving  Due to dehydration in setting of DKA. Continue IV fluids as above  and repeat labs in a.m. Hold home losartan. -Repeat BMP in a.m.  Hyponatremia, resolved  Due to significant dehydration. S/p 2 L fluids in the ED. Continue IV fluids and monitor labs as above. -Follow-up BMP  Hyperkalemia - resolved  K 5.6 on admission. Anticipate this will improve with insulin infusion and IV fluids. -Improved with DKA treatment -BMP in a.m.  Hypokalemia - resolved  - replete and recheck as needed  Hypertension: -Resume home meds -Continue labetalol and hydralazine as needed  CAD/history of CVA/hyperlipidemia: Continue aspirin 81 mg daily and statin.  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 Continue management as above. Resume home Namenda, Aricept, Celexa. Delirium and fall precautions.  The patient's chronic medical conditions were treated accordingly per the patient's home medication regimen except as noted.  On day of discharge, patient was felt deemed stable for discharge. Patient/family member advised to call PCP or come back to ER if needed.   Discharge Diagnoses:   Principal Diagnosis: DKA (diabetic ketoacidoses) [facility])  Active Hospital Problems   Diagnosis Date Noted  . DKA (diabetic ketoacidoses) (HCC) 10/14/2019  . Hypernatremia 04/19/2020  . Hyperkalemia 04/19/2020  . Hyperlipidemia 04/19/2020  . Alzheimer's dementia without behavioral disturbance (HCC)   . AKI (acute kidney injury) (HCC) 10/06/2019  . Hypertension associated with diabetes [facility])     Resolved Hospital Problems  No resolved problems to display.    Discharge Instructions    Diet - low sodium heart healthy  Complete by: As directed    Increase activity slowly   Complete by: As directed      Allergies as of 04/22/2020   No Known Allergies     Medication List    TAKE these medications   acetaminophen 500 MG tablet Commonly known as: TYLENOL Take 2 tablets (1,000 mg total) by mouth every 6 (six) hours  as needed for mild pain or headache. What changed: how much to take   aspirin 81 MG EC tablet Take 1 tablet (81 mg total) by mouth daily.   Centrum Adults Tabs Take 1 tablet by mouth every morning.   citalopram 20 MG tablet Commonly known as: CELEXA Take 20 mg by mouth daily.   Combigan 0.2-0.5 % ophthalmic solution Generic drug: brimonidine-timolol Place 1 drop into both eyes daily.   donepezil 5 MG tablet Commonly known as: ARICEPT Take 5 mg by mouth at bedtime.   Ensure Max Protein Liqd Take 330 mLs (11 oz total) by mouth 2 (two) times daily between meals.   famotidine 20 MG tablet Commonly known as: PEPCID Take 20 mg by mouth daily.   HumaLOG 100 UNIT/ML injection Generic drug: insulin lispro Inject 4 Units into the skin 3 (three) times daily with meals. (plus sliding scale)   hydrALAZINE 50 MG tablet Commonly known as: APRESOLINE Take 50 mg by mouth 3 (three) times daily.   insulin glargine 100 UNIT/ML injection Commonly known as: LANTUS Inject 0.3 mLs (30 Units total) into the skin at bedtime.   loratadine 10 MG tablet Commonly known as: CLARITIN Take 10 mg by mouth daily.   losartan 50 MG tablet Commonly known as: COZAAR Take 50 mg by mouth at bedtime.   lovastatin 40 MG tablet Commonly known as: MEVACOR Take 40 mg by mouth daily after supper.   Magnesium Oxide 400 (240 Mg) MG Tabs Take 400 mg by mouth daily.   memantine 5 MG tablet Commonly known as: NAMENDA Take 5 mg by mouth 2 (two) times daily.   saccharomyces boulardii 250 MG capsule Commonly known as: FLORASTOR Take 250 mg by mouth 2 (two) times daily.   simethicone 80 MG chewable tablet Commonly known as: MYLICON Chew 1 tablet (80 mg total) by mouth 4 (four) times daily as needed for flatulence.       Follow-up Information    Curtis Sites III, MD. Schedule an appointment as soon as possible for a visit in 1 week(s).   Specialty: Internal Medicine Contact information: [address] [facility] [place] [address] [phone]        Raj Janus, MD .   Specialty: Endocrinology Contact information: [phone] [facility] [address] [facility] [place] [address] [phone]              No Known Allergies  Consultations: none  Discharge Exam: BP (!) 155/66 (BP Location: Left Arm)   Pulse 70   Temp 97.8 F (36.6 C) (Oral)   Resp 18   Ht  (1.6 m)   Wt 59 kg   SpO2 100%   BMI 23.03 kg/m  General appearance: Very awake and alert this am, feeling back to normal state Head: Normocephalic, without obvious abnormality Eyes: EOMI Lungs: clear to auscultation bilaterally Heart: regular rate and rhythm and S1, S2 normal Abdomen: Obese, soft, nontender Extremities: no lower extremity edema Skin: mobility and turgor normal Neurologic: no focal deficits   The results of significant diagnostics from this hospitalization (including imaging, microbiology, ancillary  and laboratory) are listed below for reference.   Microbiology: Recent Results (from the past 240 hour(s))  SARS Coronavirus 2 by RT PCR (hospital order, performed in [facility] hospital lab) Nasopharyngeal Nasopharyngeal Swab     Status: None   Collection Time: 04/19/20  7:07 PM   Specimen: Nasopharyngeal Swab  Result Value Ref Range Status   SARS Coronavirus 2 NEGATIVE NEGATIVE Final    Comment: (NOTE) SARS-CoV-2 target nucleic acids are NOT DETECTED.  The SARS-CoV-2 RNA is generally detectable in upper and lower respiratory specimens during the acute phase of infection. The lowest concentration of SARS-CoV-2 viral copies this assay can detect is 250 copies / mL. A negative result does not preclude SARS-CoV-2 infection and should not be used as the sole basis for treatment or other patient management decisions.  A negative result may occur with improper specimen collection / handling, submission of specimen other than nasopharyngeal swab, presence of viral  mutation(s) within the areas targeted by this assay, and inadequate number of viral copies (<250 copies / mL). A negative result must be combined with clinical observations, patient history, and epidemiological information.  Fact Sheet for Patients:   BoilerBrush.com.cyhttps://www.fda.gov/media/136312/download  Fact Sheet for Healthcare Providers: https://pope.com/https://www.fda.gov/media/136313/download  This test is not yet approved or  cleared by the [place] FDA and has been authorized for detection and/or diagnosis of SARS-CoV-2 by FDA under an Emergency Use Authorization (EUA).  This EUA will remain in effect (meaning this test can be used) for the duration of the COVID-19 declaration under Section 564(b)(1) of the Act, 21 U.S.C. section 360bbb-3(b)(1), unless the authorization is terminated or revoked sooner.  Performed at [facility], [address] [place], [place] [address]      Labs: BNP (last 3 results) Recent Labs    01/19/20 1612  BNP 73.3   Basic Metabolic Panel: Recent Labs  Lab 04/19/20 2046 04/20/20 0712 04/20/20 1737 04/21/20 0501 04/22/20 0437  NA 139 144 142 145 142  K 5.2* 3.6 3.9 3.4* 3.1*  CL 96* 107 109 110 106  CO2 7* 26 20* 26 29  GLUCOSE 816* 185* 332* 193* 64*  BUN 44* 40* 40* 38* 26*  CREATININE 2.14* 1.34* 1.38* 1.09* 0.77  CALCIUM 9.9 9.4 8.8* 8.6* 8.6*  MG  --  2.5*  --  2.1 2.0  PHOS  --  1.7*  --   --   --    Liver Function Tests: No results for input(s): AST, ALT, ALKPHOS, BILITOT, PROT, ALBUMIN in the last 168 hours. No results for input(s): LIPASE, AMYLASE in the last 168 hours. No results for input(s): AMMONIA in the last 168 hours. CBC: Recent Labs  Lab 04/19/20 1802 04/20/20 0712 04/21/20 0501 04/22/20 0437  WBC 26.4* 24.8* 20.7* 11.5*  NEUTROABS  --  20.4* 17.6* 8.3*  HGB 12.0 10.9* 10.8* 11.0*  HCT 39.6 32.5* 32.8* 32.1*  MCV 102.9* 91.5 93.7 89.9  PLT 300 260 237 183   Cardiac Enzymes: No results for input(s): CKTOTAL,  CKMB, CKMBINDEX, TROPONINI in the last 168 hours. BNP: Invalid input(s): POCBNP CBG: Recent Labs  Lab 04/21/20 1345 04/21/20 1652 04/21/20 2131 04/22/20 0750 04/22/20 1136  GLUCAP 221* 127* 79 118* 340*   D-Dimer No results for input(s): DDIMER in the last 72 hours. Hgb A1c Recent Labs    04/19/20 2046  HGBA1C 13.0*   Lipid Profile No results for input(s): CHOL, HDL, LDLCALC, TRIG, CHOLHDL, LDLDIRECT in the last 72 hours. Thyroid function studies No results for  input(s): TSH, T4TOTAL, T3FREE, THYROIDAB in the last 72 hours.  Invalid input(s): FREET3 Anemia work up Recent Labs    04/20/20 0712 04/20/20 0717  VITAMINB12  --  1,182*  FOLATE 15.5  --    Urinalysis    Component Value Date/Time   COLORURINE YELLOW (A) 04/19/2020 1907   APPEARANCEUR HAZY (A) 04/19/2020 1907   APPEARANCEUR Hazy 04/04/2012 2050   LABSPEC 1.016 04/19/2020 1907   LABSPEC 1.017 04/04/2012 2050   PHURINE 5.0 04/19/2020 1907   GLUCOSEU >=500 (A) 04/19/2020 1907   GLUCOSEU >=500 04/04/2012 2050   HGBUR SMALL (A) 04/19/2020 1907   BILIRUBINUR NEGATIVE 04/19/2020 1907   BILIRUBINUR Negative 04/04/2012 2050   KETONESUR 80 (A) 04/19/2020 1907   PROTEINUR NEGATIVE 04/19/2020 1907   NITRITE NEGATIVE 04/19/2020 1907   LEUKOCYTESUR NEGATIVE 04/19/2020 1907   LEUKOCYTESUR Negative 04/04/2012 2050   Sepsis Labs Invalid input(s): PROCALCITONIN,  WBC,  LACTICIDVEN Microbiology Recent Results (from the past 240 hour(s))  SARS Coronavirus 2 by RT PCR (hospital order, performed in [facility] hospital lab) Nasopharyngeal Nasopharyngeal Swab     Status: None   Collection Time: 04/19/20  7:07 PM   Specimen: Nasopharyngeal Swab  Result Value Ref Range Status   SARS Coronavirus 2 NEGATIVE NEGATIVE Final    Comment: (NOTE) SARS-CoV-2 target nucleic acids are NOT DETECTED.  The SARS-CoV-2 RNA is generally detectable in upper and lower respiratory specimens during the acute phase of infection. The  lowest concentration of SARS-CoV-2 viral copies this assay can detect is 250 copies / mL. A negative result does not preclude SARS-CoV-2 infection and should not be used as the sole basis for treatment or other patient management decisions.  A negative result may occur with improper specimen collection / handling, submission of specimen other than nasopharyngeal swab, presence of viral mutation(s) within the areas targeted by this assay, and inadequate number of viral copies (<250 copies / mL). A negative result must be combined with clinical observations, patient history, and epidemiological information.  Fact Sheet for Patients:   BoilerBrush.com.cy  Fact Sheet for Healthcare Providers: https://pope.com/  This test is not yet approved or  cleared by the [place] FDA and has been authorized for detection and/or diagnosis of SARS-CoV-2 by FDA under an Emergency Use Authorization (EUA).  This EUA will remain in effect (meaning this test can be used) for the duration of the COVID-19 declaration under Section 564(b)(1) of the Act, 21 U.S.C. section 360bbb-3(b)(1), unless the authorization is terminated or revoked sooner.  Performed at [facility], [address] [place], [place] [address]     Procedures/Studies: CT Head Wo Contrast  Result Date: 04/19/2020 CLINICAL DATA:  Altered mental status EXAM: CT HEAD WITHOUT CONTRAST TECHNIQUE: Contiguous axial images were obtained from the base of the skull through the vertex without intravenous contrast. COMPARISON:  01/19/2020 FINDINGS: Brain: There is normal anatomic configuration of the brain. Mild parenchymal volume loss is commensurate with the patient's age. Moderate periventricular and subcortical white matter changes are present likely reflecting the sequela of small vessel ischemia. Borderline ventriculomegaly likely reflects the sequela of central atrophy. Remote infarct  noted within the left subinsular white matter. No evidence of acute intracranial hemorrhage or infarct. No abnormal mass effect or midline shift. No abnormal intra or extra-axial mass lesion or fluid collection. The cerebellum is unremarkable. Vascular: No asymmetric hyperdense vasculature at the skull base. Skull: Intact Sinuses/Orbits: The paranasal sinuses are clear. The orbits are unremarkable. Other: Mastoid air cells and middle  ear cavities are clear. IMPRESSION: 1. No acute intracranial abnormality. 2. Moderate periventricular and subcortical white matter changes likely reflecting the sequela of small vessel ischemia. Remote left subinsular white matter infarct. Electronically Signed   By: Helyn Numbers MD   On: 04/19/2020 19:40   DG Chest Portable 1 View  Result Date: 04/19/2020 CLINICAL DATA:  Evaluate for pneumonia. Altered mental status and weakness. EXAM: PORTABLE CHEST 1 VIEW COMPARISON:  04/11/2020 FINDINGS: Normal heart size. No pleural effusion or edema. No airspace opacities identified. Chronic appearing left posterolateral rib deformities are again noted and appears similar IMPRESSION: No acute cardiopulmonary abnormalities. Electronically Signed   By: Signa Kell M.D.   On: 04/19/2020 19:20     Time coordinating discharge: Over 30 minutes    Lewie Chamber, MD  [facility] 04/22/2020, 1:34 PM Pager: Secure chat  If 7PM-7AM, please contact night-coverage [email or web address] Password [email or web address]

## 2020-04-22 NOTE — Plan of Care (Signed)
  Problem: Health Behavior/Discharge Planning: Goal: Ability to identify and utilize available resources and services will improve Outcome: Progressing   

## 2020-04-22 NOTE — Progress Notes (Signed)
DISCHARGE NOTE:  Pt given discharge instructions pt verbalized understanding. Pt wheeled to car by staff, sister providing transportation.

## 2020-04-22 NOTE — Discharge Instructions (Signed)
Preventing Diabetic Ketoacidosis °Diabetic ketoacidosis (DKA) is a life-threatening complication of diabetes (diabetes mellitus). It develops when there is not enough of a hormone called insulin in the body. If the body does not have enough insulin, it cannot divide (break down) sugar (glucose) into usable cells, so it breaks down fats instead. This leads to the production of acids (ketones), which can cause the blood to have too much acid in it (acidosis). DKA is a medical emergency that must be treated at the hospital. °You may be more likely to develop DKA if you have type 1 diabetes and you take insulin. You can prevent DKA by working closely with your health care provider to manage your diabetes. °What nutrition changes can be made? ° °· Follow your meal plan, as directed by your health care provider or diet and nutrition specialist (dietitian). °· Eat healthy meals at about the same time every day. Have healthy snacks between meals. °· Avoid not eating for long periods of time. Do not skip meals, especially if you are ill. °· Avoid regularly eating foods that contain a lot of sugar. Also avoid drinking alcohol. Sugary food and alcohol increase your risk of high blood glucose (hyperglycemia), which increases your risk for DKA. °· Drink enough fluid to keep your urine pale yellow. Dehydration increases your risk for DKA. °What actions can I take to lower my risk? °To lower your risk for diabetic ketoacidosis, manage your diabetes as directed by your health care provider: °· Take insulin and other diabetes medicines as directed. °· Check your blood glucose every day, as often as directed. °· Follow your sick day plan whenever you cannot eat or drink as usual. Make this plan in advance with your health care provider. °· Check your urine for ketones as often as directed. °? During times when you are sick, check your ketones every 4-6 hours. °? If you develop symptoms of DKA, check your ketones right away. °· If you  have ketones in your urine: °? Contact your health care provider right away. °? Do not exercise. °· Know the symptoms of DKA so that you can get treatment as soon as possible. °· Make sure that people at work, home, and school know how to check your blood glucose, in case you are not able to do it yourself. °· Carry a medical alert card or wear medical alert jewelry that says that you have diabetes. °Why are these changes important? °DKA is a warning sign that your diabetes is not being well-controlled. You may need to work with your health care provider to adjust your diabetes management plan. DKA can lead to a serious medical emergency that can be life-threatening. °Where to find support °For more support with preventing DKA: °· Talk with your health care provider. °· Consider joining a support group. The American Diabetes Association has an online support community at: community.diabetes.org/home °Where to find more information °Learn more about preventing DKA from: °· American Diabetes Association: www.diabetes.org °· American Heart Association: www.heart.org °Contact a health care provider if: °You develop symptoms of DKA, such as: °· Fatigue. °· Weight loss. °· Excessive thirst. °· Light-headedness. °· Fruity or sweet-smelling breath. °· Excessive urination. °· Vision changes. °· Confusion or irritability. °· Nausea. °· Vomiting. °· Rapid breathing. °· Pain in the abdomen. °· Feeling warm in your face (flushed). This may or may not include a reddish color coming to your face. °If you develop any of these symptoms, do not wait to see if the symptoms will   go away. Get medical help right away. Call your local emergency services (911 in the [place] Do not drive yourself to the hospital. °Summary °· DKA may be a warning sign that your diabetes is not being well-controlled. You may need to work with your health care provider to adjust your diabetes management plan. °· Preventing high blood glucose and dehydration  helps prevent DKA. °· Check your urine for ketones as often as directed. You may need to check more often when your blood glucose level is high and when you are ill. °· DKA is a medical emergency. Make sure you know the symptoms so that you can recognize and get treatment right away. °This information is not intended to replace advice given to you by your health care provider. Make sure you discuss any questions you have with your health care provider. °Document Revised: 11/13/2018 Document Reviewed: 02/20/2017 °Elsevier Patient Education © 2020 Elsevier Inc. ° °

## 2020-04-25 ENCOUNTER — Emergency Department: Payer: Medicare Other

## 2020-04-25 ENCOUNTER — Other Ambulatory Visit: Payer: Self-pay

## 2020-04-25 ENCOUNTER — Inpatient Hospital Stay
Admission: EM | Admit: 2020-04-25 | Discharge: 2020-04-29 | DRG: 637 | Disposition: A | Discharge status: Home Health | Payer: Medicare Other | Attending: Internal Medicine | Admitting: Internal Medicine

## 2020-04-25 DIAGNOSIS — Z9861 Coronary angioplasty status: Secondary | ICD-10-CM | POA: Diagnosis not present

## 2020-04-25 DIAGNOSIS — E876 Hypokalemia: Secondary | ICD-10-CM | POA: Diagnosis not present

## 2020-04-25 DIAGNOSIS — Z9114 Patient's other noncompliance with medication regimen: Secondary | ICD-10-CM | POA: Diagnosis not present

## 2020-04-25 DIAGNOSIS — K219 Gastro-esophageal reflux disease without esophagitis: Secondary | ICD-10-CM | POA: Diagnosis present

## 2020-04-25 DIAGNOSIS — Y92009 Unspecified place in unspecified non-institutional (private) residence as the place of occurrence of the external cause: Secondary | ICD-10-CM

## 2020-04-25 DIAGNOSIS — J028 Acute pharyngitis due to other specified organisms: Secondary | ICD-10-CM | POA: Diagnosis not present

## 2020-04-25 DIAGNOSIS — Z20822 Contact with and (suspected) exposure to covid-19: Secondary | ICD-10-CM | POA: Diagnosis present

## 2020-04-25 DIAGNOSIS — J029 Acute pharyngitis, unspecified: Secondary | ICD-10-CM | POA: Diagnosis not present

## 2020-04-25 DIAGNOSIS — N179 Acute kidney failure, unspecified: Secondary | ICD-10-CM | POA: Diagnosis present

## 2020-04-25 DIAGNOSIS — F0280 Dementia in other diseases classified elsewhere without behavioral disturbance: Secondary | ICD-10-CM | POA: Diagnosis present

## 2020-04-25 DIAGNOSIS — T383X6A Underdosing of insulin and oral hypoglycemic [antidiabetic] drugs, initial encounter: Secondary | ICD-10-CM | POA: Diagnosis present

## 2020-04-25 DIAGNOSIS — E875 Hyperkalemia: Secondary | ICD-10-CM | POA: Diagnosis present

## 2020-04-25 DIAGNOSIS — F0150 Vascular dementia without behavioral disturbance: Secondary | ICD-10-CM | POA: Diagnosis present

## 2020-04-25 DIAGNOSIS — G309 Alzheimer's disease, unspecified: Secondary | ICD-10-CM | POA: Diagnosis present

## 2020-04-25 DIAGNOSIS — G9341 Metabolic encephalopathy: Secondary | ICD-10-CM | POA: Diagnosis present

## 2020-04-25 DIAGNOSIS — Z833 Family history of diabetes mellitus: Secondary | ICD-10-CM

## 2020-04-25 DIAGNOSIS — E1110 Type 2 diabetes mellitus with ketoacidosis without coma: Principal | ICD-10-CM | POA: Diagnosis present

## 2020-04-25 DIAGNOSIS — R296 Repeated falls: Secondary | ICD-10-CM | POA: Diagnosis present

## 2020-04-25 DIAGNOSIS — R4182 Altered mental status, unspecified: Secondary | ICD-10-CM

## 2020-04-25 DIAGNOSIS — I10 Essential (primary) hypertension: Secondary | ICD-10-CM | POA: Diagnosis present

## 2020-04-25 DIAGNOSIS — Z8673 Personal history of transient ischemic attack (TIA), and cerebral infarction without residual deficits: Secondary | ICD-10-CM

## 2020-04-25 DIAGNOSIS — E1010 Type 1 diabetes mellitus with ketoacidosis without coma: Secondary | ICD-10-CM

## 2020-04-25 DIAGNOSIS — Z66 Do not resuscitate: Secondary | ICD-10-CM | POA: Diagnosis present

## 2020-04-25 DIAGNOSIS — Z794 Long term (current) use of insulin: Secondary | ICD-10-CM | POA: Diagnosis not present

## 2020-04-25 DIAGNOSIS — Z7982 Long term (current) use of aspirin: Secondary | ICD-10-CM | POA: Diagnosis not present

## 2020-04-25 DIAGNOSIS — I252 Old myocardial infarction: Secondary | ICD-10-CM

## 2020-04-25 DIAGNOSIS — Z91138 Patient's unintentional underdosing of medication regimen for other reason: Secondary | ICD-10-CM | POA: Diagnosis not present

## 2020-04-25 DIAGNOSIS — Z79899 Other long term (current) drug therapy: Secondary | ICD-10-CM | POA: Diagnosis not present

## 2020-04-25 DIAGNOSIS — K921 Melena: Secondary | ICD-10-CM | POA: Diagnosis not present

## 2020-04-25 DIAGNOSIS — Z8249 Family history of ischemic heart disease and other diseases of the circulatory system: Secondary | ICD-10-CM

## 2020-04-25 LAB — BLOOD GAS, VENOUS
Acid-base deficit: 21.9 mmol/L — ABNORMAL HIGH (ref 0.0–2.0)
Bicarbonate: 7.2 mmol/L — ABNORMAL LOW (ref 20.0–28.0)
O2 Saturation: 52.3 %
Patient temperature: 37
pCO2, Ven: 26 mmHg — ABNORMAL LOW (ref 44.0–60.0)
pH, Ven: 7.05 — CL (ref 7.250–7.430)
pO2, Ven: 42 mmHg (ref 32.0–45.0)

## 2020-04-25 LAB — GLUCOSE, CAPILLARY
Glucose-Capillary: 458 mg/dL — ABNORMAL HIGH (ref 70–99)
Glucose-Capillary: >600 mg/dL (ref 70–99)
Glucose-Capillary: >600 mg/dL (ref 70–99)
Glucose-Capillary: >600 mg/dL (ref 70–99)
Glucose-Capillary: >600 mg/dL (ref 70–99)

## 2020-04-25 LAB — CBC WITH DIFFERENTIAL/PLATELET
Abs Immature Granulocytes: 0.44 10*3/uL — ABNORMAL HIGH (ref 0.00–0.07)
Basophils Absolute: 0.1 10*3/uL (ref 0.0–0.1)
Basophils Relative: 0 %
Eosinophils Absolute: 0 10*3/uL (ref 0.0–0.5)
Eosinophils Relative: 0 %
HCT: 39 % (ref 36.0–46.0)
Hemoglobin: 11.7 g/dL — ABNORMAL LOW (ref 12.0–15.0)
Immature Granulocytes: 2 %
Lymphocytes Relative: 4 %
Lymphs Abs: 0.8 10*3/uL (ref 0.7–4.0)
MCH: 30.8 pg (ref 26.0–34.0)
MCHC: 30 g/dL (ref 30.0–36.0)
MCV: 102.6 fL — ABNORMAL HIGH (ref 80.0–100.0)
Monocytes Absolute: 1.2 10*3/uL — ABNORMAL HIGH (ref 0.1–1.0)
Monocytes Relative: 5 %
Neutro Abs: 19.9 10*3/uL — ABNORMAL HIGH (ref 1.7–7.7)
Neutrophils Relative %: 89 %
Platelets: 241 10*3/uL (ref 150–400)
RBC: 3.8 MIL/uL — ABNORMAL LOW (ref 3.87–5.11)
RDW: 13.5 % (ref 11.5–15.5)
WBC: 22.5 10*3/uL — ABNORMAL HIGH (ref 4.0–10.5)
nRBC: 0 % (ref 0.0–0.2)

## 2020-04-25 LAB — BASIC METABOLIC PANEL
BUN: 34 mg/dL — ABNORMAL HIGH (ref 8–23)
CO2: <7 mmol/L — ABNORMAL LOW (ref 22–32)
Calcium: 8.6 mg/dL — ABNORMAL LOW (ref 8.9–10.3)
Chloride: 98 mmol/L (ref 98–111)
Creatinine, Ser: 1.61 mg/dL — ABNORMAL HIGH (ref 0.44–1.00)
GFR calc Af Amer: 34 mL/min — ABNORMAL LOW (ref >60)
GFR calc non Af Amer: 30 mL/min — ABNORMAL LOW (ref >60)
Glucose, Bld: 660 mg/dL (ref 70–99)
Potassium: 4.9 mmol/L (ref 3.5–5.1)
Sodium: 138 mmol/L (ref 135–145)

## 2020-04-25 LAB — COMPREHENSIVE METABOLIC PANEL
ALT: 42 U/L (ref 0–44)
AST: 45 U/L — ABNORMAL HIGH (ref 15–41)
Albumin: 3.4 g/dL — ABNORMAL LOW (ref 3.5–5.0)
Alkaline Phosphatase: 75 U/L (ref 38–126)
Anion gap: 35 — ABNORMAL HIGH (ref 5–15)
BUN: 31 mg/dL — ABNORMAL HIGH (ref 8–23)
CO2: 7 mmol/L — ABNORMAL LOW (ref 22–32)
Calcium: 9.3 mg/dL (ref 8.9–10.3)
Chloride: 94 mmol/L — ABNORMAL LOW (ref 98–111)
Creatinine, Ser: 1.58 mg/dL — ABNORMAL HIGH (ref 0.44–1.00)
GFR calc Af Amer: 35 mL/min — ABNORMAL LOW (ref >60)
GFR calc non Af Amer: 30 mL/min — ABNORMAL LOW (ref >60)
Glucose, Bld: 688 mg/dL (ref 70–99)
Potassium: 6.2 mmol/L — ABNORMAL HIGH (ref 3.5–5.1)
Sodium: 136 mmol/L (ref 135–145)
Total Bilirubin: 2.4 mg/dL — ABNORMAL HIGH (ref 0.3–1.2)
Total Protein: 6.6 g/dL (ref 6.5–8.1)

## 2020-04-25 LAB — PROCALCITONIN: Procalcitonin: 0.51 ng/mL

## 2020-04-25 LAB — SARS CORONAVIRUS 2 BY RT PCR (HOSPITAL ORDER, PERFORMED IN ~~LOC~~ HOSPITAL LAB): SARS Coronavirus 2: NEGATIVE

## 2020-04-25 LAB — BETA-HYDROXYBUTYRIC ACID: Beta-Hydroxybutyric Acid: >8 mmol/L — ABNORMAL HIGH (ref 0.05–0.27)

## 2020-04-25 MED ORDER — DOCUSATE SODIUM 100 MG PO CAPS: 100.0000 mg | ORAL_CAPSULE | Freq: Two times a day (BID) | ORAL | Status: DC | PRN

## 2020-04-25 MED ORDER — POLYETHYLENE GLYCOL 3350 17 G PO PACK: 17.0000 g | PACK | Freq: Every day | ORAL | Status: DC | PRN

## 2020-04-25 MED ORDER — HEPARIN SODIUM (PORCINE) 5000 UNIT/ML IJ SOLN
5000.0000 [IU] | Freq: Three times a day (TID) | INTRAMUSCULAR | Status: DC
  Administered 2020-04-25 – 2020-04-29 (×11): 5000 [IU] via SUBCUTANEOUS
  Filled 2020-04-25 (×10): qty 1

## 2020-04-25 MED ORDER — LACTATED RINGERS IV SOLN: INTRAVENOUS | Status: DC

## 2020-04-25 MED ORDER — LACTATED RINGERS IV BOLUS
1000.0000 mL | Freq: Once | INTRAVENOUS | Status: AC
  Administered 2020-04-25: 1000 mL via INTRAVENOUS

## 2020-04-25 MED ORDER — ONDANSETRON HCL 4 MG/2ML IJ SOLN: 4.0000 mg | Freq: Four times a day (QID) | INTRAMUSCULAR | Status: DC | PRN

## 2020-04-25 MED ORDER — STERILE WATER FOR INJECTION IV SOLN
INTRAVENOUS | Status: AC
  Filled 2020-04-25 (×2): qty 850

## 2020-04-25 MED ORDER — DEXTROSE 50 % IV SOLN: 0.0000 mL | INTRAVENOUS | Status: DC | PRN

## 2020-04-25 MED ORDER — SODIUM CHLORIDE 0.9 % IV SOLN: Freq: Once | INTRAVENOUS | Status: AC

## 2020-04-25 MED ORDER — DEXTROSE IN LACTATED RINGERS 5 % IV SOLN: INTRAVENOUS | Status: DC

## 2020-04-25 MED ORDER — INSULIN REGULAR(HUMAN) IN NACL 100-0.9 UT/100ML-% IV SOLN
INTRAVENOUS | Status: DC
  Administered 2020-04-25: 7.5 [IU]/h via INTRAVENOUS
  Filled 2020-04-25: qty 100

## 2020-04-25 NOTE — ED Triage Notes (Signed)
Pt brought in by EMS from home family states AMS states hx of the same and was admitted with DKA.

## 2020-04-25 NOTE — ED Notes (Signed)
Lab contacted to collect blood cultures.  

## 2020-04-25 NOTE — H&P (Signed)
Name: Vicki [name] MRN: [id number] DOB: February 14, 1941    ADMISSION DATE:  04/25/2020 CONSULTATION DATE:  04/25/2020  REFERRING MD :  Dr. Charna Archer  CHIEF COMPLAINT:   Altered Mental Status  BRIEF PATIENT DESCRIPTION:  79 y.o. Female admitted with severe DKA due to medication noncompliance, requiring insulin and bicarb drips.  SIGNIFICANT EVENTS  9/21: Presented to ED; PCCM asked to admit for severe DKA  STUDIES:  9/21: CXR>>No active disease.   CULTURES: SARS-CoV-2 PCR 9/21>> negative Blood cultures x2 9/21>> Urine 9/21>>  ANTIBIOTICS: N/A  HISTORY OF PRESENT ILLNESS:   Vicki [name] is a 79 year old female with a past medical history significant for dementia, stroke, hypertension, and diabetes who presents to [facility] ED on 04/25/2020 due to altered mental status.  History is limited due to patient's confusion and no family is present, therefore history is obtained from ED and nursing notes.  Per notes, patient's family reported she has been increasingly confused over the past couple days.  Of note she was recently admitted to [facility] and discharged on 04/22/2020 for severe DKA and AKI.  Patient's family reported she has not been compliant with her home insulin regimen.  She denied any fevers, cough, chest pain, shortness of breath, abdominal pain, dysuria, hematuria.  EMS did report one episode of vomiting during transport.  Initial work-up in the ED revealed glucose 688, bicarb 7, potassium 6.2, BUN 31, creatinine 1.58, anion gap 35, WBC 22.5 with neutrophilia, hemoglobin 11.7.  Venous blood gas with pH 7.05/CO2 26/O2 42/bicarb 7.2.  Chest x-ray is normal, urinalysis is currently pending.  Her COVID-19 PCR is negative.  EKG is unremarkable. She is somnolent, but arouses to voice and has no focal neurological deficits. She met criteria for DKA, therefore she received IV fluids and was placed on insulin drip. Given her severe acidosis, she was also placed on Bicarb drip.  PCCM is asked to  admit the pt to ICU for further workup and treatment of severe DKA in the setting of medication noncompliance, requiring insulin and Bicarb drips.   PAST MEDICAL HISTORY :   has a past medical history of Diabetes mellitus without complication ([place]), GERD (gastroesophageal reflux disease), Hypertension, Lesion of sciatic nerve, right side, MI (myocardial infarction) ([place]), and Stroke ([place]).  has a past surgical history that includes Abdominal hysterectomy; Breast surgery; LEFT HEART CATH AND CORONARY ANGIOGRAPHY (N/A, 10/01/2016); and Breast biopsy (Right). Prior to Admission medications   Medication Sig Start Date End Date Taking? Authorizing Provider  acetaminophen (TYLENOL) 500 MG tablet Take 2 tablets (1,000 mg total) by mouth every 6 (six) hours as needed for mild pain or headache. 04/22/20   Dwyane Dee, MD  aspirin EC 81 MG EC tablet Take 1 tablet (81 mg total) by mouth daily. 03/26/16   Theodoro Grist, MD  brimonidine-timolol (COMBIGAN) 0.2-0.5 % ophthalmic solution Place 1 drop into both eyes daily.    [provider]  citalopram (CELEXA) 20 MG tablet Take 20 mg by mouth daily.    [provider]  donepezil (ARICEPT) 5 MG tablet Take 5 mg by mouth at bedtime.    [provider]  Ensure Max Protein (ENSURE MAX PROTEIN) LIQD Take 330 mLs (11 oz total) by mouth 2 (two) times daily between meals. 10/18/19   Lavina Hamman, MD  famotidine (PEPCID) 20 MG tablet Take 20 mg by mouth daily. 03/06/20   [provider]  hydrALAZINE (APRESOLINE) 50 MG tablet Take 50 mg by mouth 3 (three) times daily.  [provider]  insulin glargine (LANTUS) 100 UNIT/ML injection Inject 0.3 mLs (30 Units total) into the skin at bedtime. 10/27/19   Fritzi Mandes, MD  insulin lispro (HUMALOG) 100 UNIT/ML injection Inject 4 Units into the skin 3 (three) times daily with meals. (plus sliding scale)    [provider]  loratadine (CLARITIN) 10 MG tablet Take 10 mg by  mouth daily. 02/09/20   [provider]  losartan (COZAAR) 50 MG tablet Take 50 mg by mouth at bedtime.    [provider]  lovastatin (MEVACOR) 40 MG tablet Take 40 mg by mouth daily after supper.     [provider]  Magnesium Oxide 400 (240 Mg) MG TABS Take 400 mg by mouth daily.  01/10/20   [provider]  memantine (NAMENDA) 5 MG tablet Take 5 mg by mouth 2 (two) times daily.     [provider]  Multiple Vitamins-Minerals (CENTRUM ADULTS) TABS Take 1 tablet by mouth every morning.     [provider]  saccharomyces boulardii (FLORASTOR) 250 MG capsule Take 250 mg by mouth 2 (two) times daily.    [provider]  simethicone (MYLICON) 80 MG chewable tablet Chew 1 tablet (80 mg total) by mouth 4 (four) times daily as needed for flatulence. 10/18/19   Lavina Hamman, MD   No Known Allergies  FAMILY HISTORY:  family history includes Breast cancer (age of onset: 20) in her sister; Diabetes Mellitus II in her brother, father, and sister; Heart disease in her mother; Hypertension in her brother and sister. SOCIAL HISTORY:  reports that she has never smoked. She has never used smokeless tobacco. She reports that she does not drink alcohol and does not use drugs.   COVID-19 DISASTER DECLARATION:  FULL CONTACT PHYSICAL EXAMINATION WAS NOT POSSIBLE DUE TO TREATMENT OF COVID-19 AND  CONSERVATION OF PERSONAL PROTECTIVE EQUIPMENT, LIMITED EXAM FINDINGS INCLUDE-  Patient assessed or the symptoms described in the history of present illness.  In the context of the Global COVID-19 pandemic, which necessitated consideration that the patient might be at risk for infection with the SARS-CoV-2 virus that causes COVID-19, Institutional protocols and algorithms that pertain to the evaluation of patients at risk for COVID-19 are in a state of rapid change based on information released by regulatory bodies including the CDC and federal and state  organizations. These policies and algorithms were followed during the patient's care while in hospital.  REVIEW OF SYSTEMS:   Unable to assess due to AMS  SUBJECTIVE:  Unable to assess due to AMS  VITAL SIGNS: Temp:  [98.9 F (37.2 C)] 98.9 F (37.2 C) (09/21 1932) Pulse Rate:  [91] 91 (09/21 1930) Resp:  [20] 20 (09/21 1930) BP: (149)/(46) 149/46 (09/21 1930) SpO2:  [95 %] 95 % (09/21 1930) Weight:  [59 kg] 59 kg (09/21 1931)  PHYSICAL EXAMINATION: General:  Acutely ill appearing female, laying in bed, on room air, in NAD Neuro:  Somnolent, arouses to voice but drifts off, follows simple commands, no focal deficits, unable to assess orientation HEENT:  Atraumatic, normocephalic, neck supple, no JVD, dry mucus membranes Cardiovascular:  Regular rate and rhythm, s1s2, no M/R/G, 1+ distal pulses Lungs:  Clear to auscultation bilaterally, tachypnea (Kussmaul's respirations), even Abdomen:  Soft, nontender, nondistended, no guarding or rebound tenderness, BS+ x4 Musculoskeletal:  No deformities, no edema Skin:  Warm and dry.  No obvious rashes, lesions, or ulcerations  Recent Labs  Lab 04/21/20 0501 04/22/20 0437 04/25/20 1936  NA 145 142 136  K 3.4* 3.1* 6.2*  CL 110 106 94*  CO2 26 29 7*  BUN 38* 26* 31*  CREATININE 1.09* 0.77 1.58*  GLUCOSE 193* 64* 688*   Recent Labs  Lab 04/21/20 0501 04/22/20 0437 04/25/20 1936  HGB 10.8* 11.0* 11.7*  HCT 32.8* 32.1* 39.0  WBC 20.7* 11.5* 22.5*  PLT 237 183 241   DG Chest Portable 1 View  Result Date: 04/25/2020 CLINICAL DATA:  Altered mental status and weakness. EXAM: PORTABLE CHEST 1 VIEW COMPARISON:  April 19, 2020 FINDINGS: There is no evidence of acute infiltrate, pleural effusion or pneumothorax. The heart size and mediastinal contours are within normal limits. Chronic left-sided rib fractures are seen. The visualized skeletal structures are otherwise unremarkable. IMPRESSION: No active disease. Electronically Signed    By: Virgina Norfolk M.D.   On: 04/25/2020 20:08    ASSESSMENT / PLAN:  Severe DKA  -Follow DKA protocol -IV Fluids -Insulin drip -Bicarb drip given severe acidosis -Follow BMP q4h -Once Anion gap closed and Bicarb >20, can convert to SSI and long acting insulin -Consult Diabetes Coordinator, appreciate input   AKI Hyperkalemia>>resolved Severe Anion Gap Metabolic Acidosis -Monitor I&O's / urinary output -Follow BMP -Ensure adequate renal perfusion -Avoid nephrotoxic agents as able -Replace electrolytes as indicated -IV fluids -Bicarb gtt   Leukocytosis, no obvious source of infection identified to date -Monitor fever curve  -Trend WBC's -Check procalcitonin, if elevated will start empiric antibiotics -CXR is normal -Urinalysis is pending -Abdominal exam is benign, no signs of wounds or cellulitis   Acute Metabolic Encephalopathy in setting of severe DKA Hx: Dementia (Mixed Alzheimer's and Vascular Dementia) -Provide supportive care -Avoid sedating meds as able -Treat DKA -Hold home Namenda, Aricept, and Celxex due to AMS   Hypertension -Continuous cardiac monitoring -Maintain MAP >65 -Hold home Labetalol and Hydralazine for now due to AMS -Prn IV Hydralazine if needed for SBP >170         BEST PRACTICES DISPOSITION: ICU GOALS OF CARE: DNR/DNI (verified with pt's sister Christoper Fabian) VTE PROPHYLAXIS: SQ Heparin CONSULTS: Diabetes Coordinator Updates: Updated pt's sister Christoper Fabian via telephone 04/25/20  Darel Hong, AGACNP-BC [facility] Pager: [phone]  04/25/2020, 9:02 PM

## 2020-04-25 NOTE — ED Provider Notes (Signed)
[facility] Emergency Department Provider Note   ____________________________________________   First MD Initiated Contact with Patient 04/25/20 1924     (approximate)  I have reviewed the triage vital signs and the nursing notes.   HISTORY  Chief Complaint Altered Mental Status    HPI Vicki [name] is a 79 y.o. female with past medical history of dementia, stroke, hypertension, and diabetes who presents to the ED for altered mental status.  History is limited due to patient's confusion, per EMS family has noted patient to be increasingly confused over the past couple of days.  She was recently admitted for DKA and reportedly discharged 1 week ago.  Family had stated to EMS that she has not been compliant with her insulin regimen.  Patient admits that she has not been taking her insulin because she has "felt bad."  She denies any recent fevers, cough, chest pain, shortness of breath, abdominal pain, dysuria, or hematuria.  EMS noted that patient had one episode of vomiting during transport.  Patient noted to be hyperglycemic to greater than 500 with EMS.        Past Medical History:  Diagnosis Date  . Diabetes mellitus without complication (HCC)   . GERD (gastroesophageal reflux disease)   . Hypertension   . Lesion of sciatic nerve, right side   . MI (myocardial infarction) (HCC)   . Stroke [facility](HCC)     Patient Active Problem List   Diagnosis Date Noted  . Hypernatremia 04/19/2020  . Hyperkalemia 04/19/2020  . Hyperlipidemia 04/19/2020  . Sepsis (HCC) 01/19/2020  . Alzheimer's dementia without behavioral disturbance (HCC)   . DKA (diabetic ketoacidoses) (HCC) 10/14/2019  . Diabetic ketoacidosis without coma associated with diabetes mellitus due to underlying condition (HCC)   . Dehydration 10/06/2019  . Generalized weakness 10/06/2019  . AKI (acute kidney injury) (HCC) 10/06/2019  . Hypertension associated with diabetes (HCC)   . Hypoglycemia  12/10/2017  . 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 NSTEMI (non-ST elevated myocardial infarction) (HCC) 10/01/2016  . 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 Diabetes (HCC) 09/29/2016  . GERD (gastroesophageal reflux disease) 09/29/2016  . Nausea and vomiting 03/24/2016  . Diffuse abdominal pain 03/24/2016  . Hypokalemia 03/24/2016  . Chest pain 03/24/2016  . Elevated troponin I level 09/26/2015  . 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HCC) 09/25/2015    Past Surgical History:  Procedure Laterality Date  . ABDOMINAL HYSTERECTOMY    . BREAST BIOPSY Right    neg  . BREAST SURGERY    . LEFT HEART CATH AND CORONARY ANGIOGRAPHY N/A 10/01/2016   Procedure: Left Heart Cath and Coronary Angiography and possible PCI;  Surgeon: Alwyn Peawayne D Callwood, MD;  Location: [facility] INVASIVE CV LAB;  Service: Cardiovascular;  Laterality: N/A;    Prior to Admission medications   Medication Sig Start Date End Date Taking? Authorizing Provider  acetaminophen (TYLENOL) 500 MG tablet Take 2 tablets (1,000 mg total) by mouth every 6 (six) hours as needed for mild pain or headache. 04/22/20   Lewie ChamberGirguis, David, MD  aspirin EC 81 MG EC tablet Take 1 tablet (81 mg total) by mouth daily. 03/26/16   [name], [name], MD  brimonidine-timolol (COMBIGAN) 0.2-0.5 % ophthalmic solution Place 1 drop into both eyes daily.    [provider]  citalopram (CELEXA) 20 MG tablet Take 20 mg by mouth daily.    [provider]  donepezil (ARICEPT) 5 MG tablet Take  5 mg by mouth at bedtime.    [provider]  Ensure Max Protein (ENSURE MAX PROTEIN) LIQD Take 330 mLs (11 oz total) by mouth 2 (two) times daily between meals. 10/18/19   Rolly Salter, MD  famotidine (PEPCID) 20 MG tablet Take 20 mg by mouth daily. 03/06/20   [provider]  hydrALAZINE (APRESOLINE) 50 MG tablet Take 50 mg by mouth 3 (three) times daily.    [provider]   insulin glargine (LANTUS) 100 UNIT/ML injection Inject 0.3 mLs (30 Units total) into the skin at bedtime. 10/27/19   Enedina Finner, MD  insulin lispro (HUMALOG) 100 UNIT/ML injection Inject 4 Units into the skin 3 (three) times daily with meals. (plus sliding scale)    [provider]  loratadine (CLARITIN) 10 MG tablet Take 10 mg by mouth daily. 02/09/20   [provider]  losartan (COZAAR) 50 MG tablet Take 50 mg by mouth at bedtime.    [provider]  lovastatin (MEVACOR) 40 MG tablet Take 40 mg by mouth daily after supper.     [provider]  Magnesium Oxide 400 (240 Mg) MG TABS Take 400 mg by mouth daily.  01/10/20   [provider]  memantine (NAMENDA) 5 MG tablet Take 5 mg by mouth 2 (two) times daily.     [provider]  Multiple Vitamins-Minerals (CENTRUM ADULTS) TABS Take 1 tablet by mouth every morning.     [provider]  saccharomyces boulardii (FLORASTOR) 250 MG capsule Take 250 mg by mouth 2 (two) times daily.    [provider]  simethicone (MYLICON) 80 MG chewable tablet Chew 1 tablet (80 mg total) by mouth 4 (four) times daily as needed for flatulence. 10/18/19   Rolly Salter, MD    Allergies Patient has no known allergies.  Family History  Problem Relation Age of Onset  . Heart disease Mother   . Diabetes Mellitus II Father   . Hypertension Sister   . Diabetes Mellitus II Sister   . Breast cancer Sister 59  . Hypertension Brother   . Diabetes Mellitus II Brother     Social History Social History   Tobacco Use  . Smoking status: Never Smoker  . Smokeless tobacco: Never Used  Vaping Use  . Vaping Use: Never used  Substance Use Topics  . Alcohol use: No  . Drug use: No    Review of Systems  Constitutional: No fever/chills.  Positive for fatigue and confusion. Eyes: No visual changes. ENT: No sore throat. Cardiovascular: Denies chest pain. Respiratory: Denies shortness of  breath. Gastrointestinal: No abdominal pain.  Positive for nausea and vomiting.  No diarrhea.  No constipation. Genitourinary: Negative for dysuria. Musculoskeletal: Negative for back pain. Skin: Negative for rash. Neurological: Negative for headaches, focal weakness or numbness.  ____________________________________________   PHYSICAL EXAM:  VITAL SIGNS: ED Triage Vitals  Enc Vitals Group     BP      Pulse      Resp      Temp      Temp src      SpO2      Weight      Height      Head Circumference      Peak Flow      Pain Score      Pain Loc      Pain Edu?      Excl. in GC?     Constitutional: Somnolent but arousable to  voice, oriented to person and place, but not time. Eyes: Conjunctivae are normal. Head: Atraumatic. Nose: No congestion/rhinnorhea. Mouth/Throat: Mucous membranes are dry. Neck: Normal ROM Cardiovascular: Normal rate, regular rhythm. Grossly normal heart sounds. Respiratory: Normal respiratory effort.  No retractions. Lungs CTAB. Gastrointestinal: Soft and nontender. No distention. Genitourinary: deferred Musculoskeletal: No lower extremity tenderness nor edema. Neurologic:  Normal speech and language. No gross focal neurologic deficits are appreciated. Skin:  Skin is warm, dry and intact. No rash noted. Psychiatric: Mood and affect are normal. Speech and behavior are normal.  ____________________________________________   LABS (all labs ordered are listed, but only abnormal results are displayed)  Labs Reviewed  CBC WITH DIFFERENTIAL/PLATELET - Abnormal; Notable for the following components:      Result Value   WBC 22.5 (*)    RBC 3.80 (*)    Hemoglobin 11.7 (*)    MCV 102.6 (*)    Neutro Abs 19.9 (*)    Monocytes Absolute 1.2 (*)    Abs Immature Granulocytes 0.44 (*)    All other components within normal limits  COMPREHENSIVE METABOLIC PANEL - Abnormal; Notable for the following components:   Potassium 6.2 (*)    Chloride 94 (*)     CO2 7 (*)    Glucose, Bld 688 (*)    BUN 31 (*)    Creatinine, Ser 1.58 (*)    Albumin 3.4 (*)    AST 45 (*)    Total Bilirubin 2.4 (*)    GFR calc non Af Amer 30 (*)    GFR calc Af Amer 35 (*)    Anion gap 35 (*)    All other components within normal limits  BLOOD GAS, VENOUS - Abnormal; Notable for the following components:   pH, Ven 7.05 (*)    pCO2, Ven 26 (*)    Bicarbonate 7.2 (*)    Acid-base deficit 21.9 (*)    All other components within normal limits  GLUCOSE, CAPILLARY - Abnormal; Notable for the following components:   Glucose-Capillary >600 (*)    All other components within normal limits  SARS CORONAVIRUS 2 BY RT PCR (HOSPITAL ORDER, PERFORMED IN [place] [facility])  URINALYSIS, COMPLETE (UACMP) WITH MICROSCOPIC  BASIC METABOLIC PANEL  BASIC METABOLIC PANEL  BASIC METABOLIC PANEL  BASIC METABOLIC PANEL  BETA-HYDROXYBUTYRIC ACID  BETA-HYDROXYBUTYRIC ACID  CBG MONITORING, ED   ____________________________________________  EKG  ED ECG REPORT I, Chesley Noon, the attending physician, personally viewed and interpreted this ECG.   Date: 04/25/2020  EKG Time: 19:34  Rate: 90  Rhythm: normal sinus rhythm  Axis: Normal  Intervals:none  ST&T Change: None   PROCEDURES  Procedure(s) performed (including Critical Care):  .Critical Care Performed by: Chesley Noon, MD Authorized by: Chesley Noon, MD   Critical care provider statement:    Critical care time (minutes):  45   Critical care time was exclusive of:  Separately billable procedures and treating other patients and teaching time   Critical care was necessary to treat or prevent imminent or life-threatening deterioration of the following conditions:  Endocrine crisis   Critical care was time spent personally by me on the following activities:  Discussions with consultants, evaluation of patient's response to treatment, examination of patient, ordering and performing treatments and  interventions, ordering and review of laboratory studies, ordering and review of radiographic studies, pulse oximetry, re-evaluation of patient's condition, obtaining history from patient or surrogate and review of old charts   I assumed direction of critical care for  this patient from another provider in my specialty: no       ____________________________________________   INITIAL IMPRESSION / ASSESSMENT AND PLAN / ED COURSE       79 year old female with past medical history of dementia, stroke, hypertension, diabetes who presents to the ED for altered mental status over the past couple of days, following recent admission for DKA.  Patient noted to be hypoglycemic with vomiting during transport but otherwise hemodynamically stable on arrival.  She is somnolent but arouses to voice and does not appear to have any focal neurologic deficits.  Given her ongoing hyperglycemia, very high suspicion for DKA and we will initiate IV fluid hydration.  This is likely secondary to medication noncompliance but we will also screen chest x-ray and UA for any infectious process.  EKG is unremarkable.  Labs confirm DKA with hyperglycemia, increased anion gap, and bicarb of 7.  pH on VBG noted to be 7.05 and we will start patient on a bicarb drip.  She also has AKI with associated hyperkalemia, although there is possible hemolysis contributing to this.  No EKG changes noted and we will start patient on insulin drip without any potassium supplementation to assist with reducing hyperkalemia.  She is receiving 2 L IV fluid bolus and chest x-ray shows no evidence of infection or other acute process by my read.  She has a leukocytosis however there is no apparent source for infection at this time and we will hold off on antibiotics.  Case discussed with ICU for admission given her pH of less than 7.1 and bicarb less than 10.  Mental status remained stable as patient is arousable to voice and answers questions  appropriately.      ____________________________________________   FINAL CLINICAL IMPRESSION(S) / ED DIAGNOSES  Final diagnoses:  Diabetic ketoacidosis without coma associated with type 1 diabetes mellitus (HCC)  Altered mental status, unspecified altered mental status type     ED Discharge Orders    None       Note:  This document was prepared using Dragon voice recognition software and may include unintentional dictation errors.   Chesley Noon, MD 04/25/20 2055

## 2020-04-25 NOTE — ED Notes (Signed)
Repeat green top sent to lab °

## 2020-04-26 ENCOUNTER — Other Ambulatory Visit: Payer: Self-pay

## 2020-04-26 ENCOUNTER — Encounter: Payer: Self-pay | Admitting: Internal Medicine

## 2020-04-26 LAB — URINALYSIS, COMPLETE (UACMP) WITH MICROSCOPIC
Bilirubin Urine: NEGATIVE
Glucose, UA: >=500 mg/dL — AB
Ketones, ur: 80 mg/dL — AB
Leukocytes,Ua: NEGATIVE
Nitrite: NEGATIVE
Protein, ur: NEGATIVE mg/dL
Specific Gravity, Urine: 1.014 (ref 1.005–1.030)
pH: 5 (ref 5.0–8.0)

## 2020-04-26 LAB — CBC WITH DIFFERENTIAL/PLATELET
Abs Immature Granulocytes: 1.23 10*3/uL — ABNORMAL HIGH (ref 0.00–0.07)
Basophils Absolute: 0.1 10*3/uL (ref 0.0–0.1)
Basophils Relative: 0 %
Eosinophils Absolute: 0 10*3/uL (ref 0.0–0.5)
Eosinophils Relative: 0 %
HCT: 29.9 % — ABNORMAL LOW (ref 36.0–46.0)
Hemoglobin: 9.5 g/dL — ABNORMAL LOW (ref 12.0–15.0)
Immature Granulocytes: 5 %
Lymphocytes Relative: 10 %
Lymphs Abs: 2.7 10*3/uL (ref 0.7–4.0)
MCH: 30.9 pg (ref 26.0–34.0)
MCHC: 31.8 g/dL (ref 30.0–36.0)
MCV: 97.4 fL (ref 80.0–100.0)
Monocytes Absolute: 1.7 10*3/uL — ABNORMAL HIGH (ref 0.1–1.0)
Monocytes Relative: 6 %
Neutro Abs: 21.9 10*3/uL — ABNORMAL HIGH (ref 1.7–7.7)
Neutrophils Relative %: 79 %
Platelets: 221 10*3/uL (ref 150–400)
RBC: 3.07 MIL/uL — ABNORMAL LOW (ref 3.87–5.11)
RDW: 13.6 % (ref 11.5–15.5)
Smear Review: NORMAL
WBC: 27.6 10*3/uL — ABNORMAL HIGH (ref 4.0–10.5)
nRBC: 0 % (ref 0.0–0.2)

## 2020-04-26 LAB — BETA-HYDROXYBUTYRIC ACID: Beta-Hydroxybutyric Acid: >8 mmol/L — ABNORMAL HIGH (ref 0.05–0.27)

## 2020-04-26 LAB — GLUCOSE, CAPILLARY
Glucose-Capillary: 103 mg/dL — ABNORMAL HIGH (ref 70–99)
Glucose-Capillary: 105 mg/dL — ABNORMAL HIGH (ref 70–99)
Glucose-Capillary: 121 mg/dL — ABNORMAL HIGH (ref 70–99)
Glucose-Capillary: 125 mg/dL — ABNORMAL HIGH (ref 70–99)
Glucose-Capillary: 127 mg/dL — ABNORMAL HIGH (ref 70–99)
Glucose-Capillary: 128 mg/dL — ABNORMAL HIGH (ref 70–99)
Glucose-Capillary: 140 mg/dL — ABNORMAL HIGH (ref 70–99)
Glucose-Capillary: 141 mg/dL — ABNORMAL HIGH (ref 70–99)
Glucose-Capillary: 153 mg/dL — ABNORMAL HIGH (ref 70–99)
Glucose-Capillary: 197 mg/dL — ABNORMAL HIGH (ref 70–99)
Glucose-Capillary: 212 mg/dL — ABNORMAL HIGH (ref 70–99)
Glucose-Capillary: 218 mg/dL — ABNORMAL HIGH (ref 70–99)
Glucose-Capillary: 230 mg/dL — ABNORMAL HIGH (ref 70–99)
Glucose-Capillary: 332 mg/dL — ABNORMAL HIGH (ref 70–99)
Glucose-Capillary: 370 mg/dL — ABNORMAL HIGH (ref 70–99)
Glucose-Capillary: 424 mg/dL — ABNORMAL HIGH (ref 70–99)
Glucose-Capillary: 473 mg/dL — ABNORMAL HIGH (ref 70–99)
Glucose-Capillary: 84 mg/dL (ref 70–99)
Glucose-Capillary: 98 mg/dL (ref 70–99)

## 2020-04-26 LAB — BASIC METABOLIC PANEL
Anion gap: 28 — ABNORMAL HIGH (ref 5–15)
Anion gap: 16 — ABNORMAL HIGH (ref 5–15)
Anion gap: 14 (ref 5–15)
BUN: 35 mg/dL — ABNORMAL HIGH (ref 8–23)
BUN: 35 mg/dL — ABNORMAL HIGH (ref 8–23)
BUN: 33 mg/dL — ABNORMAL HIGH (ref 8–23)
BUN: 32 mg/dL — ABNORMAL HIGH (ref 8–23)
CO2: <7 mmol/L — ABNORMAL LOW (ref 22–32)
CO2: 9 mmol/L — ABNORMAL LOW (ref 22–32)
CO2: 22 mmol/L (ref 22–32)
CO2: 26 mmol/L (ref 22–32)
Calcium: 8.6 mg/dL — ABNORMAL LOW (ref 8.9–10.3)
Calcium: 8.6 mg/dL — ABNORMAL LOW (ref 8.9–10.3)
Calcium: 8.9 mg/dL (ref 8.9–10.3)
Calcium: 8.6 mg/dL — ABNORMAL LOW (ref 8.9–10.3)
Chloride: 102 mmol/L (ref 98–111)
Chloride: 104 mmol/L (ref 98–111)
Chloride: 104 mmol/L (ref 98–111)
Chloride: 100 mmol/L (ref 98–111)
Creatinine, Ser: 1.73 mg/dL — ABNORMAL HIGH (ref 0.44–1.00)
Creatinine, Ser: 1.64 mg/dL — ABNORMAL HIGH (ref 0.44–1.00)
Creatinine, Ser: 1.12 mg/dL — ABNORMAL HIGH (ref 0.44–1.00)
Creatinine, Ser: 1.1 mg/dL — ABNORMAL HIGH (ref 0.44–1.00)
GFR calc Af Amer: 32 mL/min — ABNORMAL LOW (ref >60)
GFR calc Af Amer: 34 mL/min — ABNORMAL LOW (ref >60)
GFR calc Af Amer: 53 mL/min — ABNORMAL LOW (ref >60)
GFR calc Af Amer: 55 mL/min — ABNORMAL LOW (ref >60)
GFR calc non Af Amer: 27 mL/min — ABNORMAL LOW (ref >60)
GFR calc non Af Amer: 29 mL/min — ABNORMAL LOW (ref >60)
GFR calc non Af Amer: 46 mL/min — ABNORMAL LOW (ref >60)
GFR calc non Af Amer: 47 mL/min — ABNORMAL LOW (ref >60)
Glucose, Bld: 457 mg/dL — ABNORMAL HIGH (ref 70–99)
Glucose, Bld: 339 mg/dL — ABNORMAL HIGH (ref 70–99)
Glucose, Bld: 148 mg/dL — ABNORMAL HIGH (ref 70–99)
Glucose, Bld: 133 mg/dL — ABNORMAL HIGH (ref 70–99)
Potassium: 3.8 mmol/L (ref 3.5–5.1)
Potassium: 3.6 mmol/L (ref 3.5–5.1)
Potassium: 4.7 mmol/L (ref 3.5–5.1)
Potassium: 3.7 mmol/L (ref 3.5–5.1)
Sodium: 140 mmol/L (ref 135–145)
Sodium: 141 mmol/L (ref 135–145)
Sodium: 142 mmol/L (ref 135–145)
Sodium: 140 mmol/L (ref 135–145)

## 2020-04-26 LAB — URINE CULTURE

## 2020-04-26 LAB — PROCALCITONIN: Procalcitonin: 1.67 ng/mL

## 2020-04-26 MED ORDER — VANCOMYCIN HCL 500 MG/100ML IV SOLN
500.0000 mg | INTRAVENOUS | Status: DC
  Administered 2020-04-27: 500 mg via INTRAVENOUS
  Filled 2020-04-26: qty 100

## 2020-04-26 MED ORDER — INSULIN GLARGINE 100 UNIT/ML ~~LOC~~ SOLN
20.0000 [IU] | Freq: Every day | SUBCUTANEOUS | Status: DC
  Administered 2020-04-27 – 2020-04-29 (×3): 20 [IU] via SUBCUTANEOUS
  Filled 2020-04-26 (×4): qty 0.2

## 2020-04-26 MED ORDER — INSULIN GLARGINE 100 UNIT/ML ~~LOC~~ SOLN
20.0000 [IU] | Freq: Every day | SUBCUTANEOUS | Status: DC
  Filled 2020-04-26: qty 0.2

## 2020-04-26 MED ORDER — SODIUM CHLORIDE 0.9 % IV SOLN: INTRAVENOUS | Status: DC

## 2020-04-26 MED ORDER — POTASSIUM CHLORIDE 10 MEQ/100ML IV SOLN
INTRAVENOUS | Status: AC
  Administered 2020-04-26: 10 meq via INTRAVENOUS
  Filled 2020-04-26: qty 100

## 2020-04-26 MED ORDER — DEXTROSE-NACL 5-0.45 % IV SOLN: INTRAVENOUS | Status: DC

## 2020-04-26 MED ORDER — SODIUM CHLORIDE 0.9 % IV SOLN
2.0000 g | INTRAVENOUS | Status: DC
  Administered 2020-04-26 – 2020-04-27 (×2): 2 g via INTRAVENOUS
  Filled 2020-04-26 (×2): qty 2

## 2020-04-26 MED ORDER — LACTATED RINGERS IV SOLN: INTRAVENOUS | Status: DC

## 2020-04-26 MED ORDER — INSULIN GLARGINE 100 UNIT/ML ~~LOC~~ SOLN
20.0000 [IU] | Freq: Once | SUBCUTANEOUS | Status: AC
  Administered 2020-04-26: 20 [IU] via SUBCUTANEOUS
  Filled 2020-04-26: qty 0.2

## 2020-04-26 MED ORDER — POTASSIUM CHLORIDE 10 MEQ/100ML IV SOLN
10.0000 meq | INTRAVENOUS | Status: AC
  Filled 2020-04-26: qty 100

## 2020-04-26 MED ORDER — VANCOMYCIN HCL 1250 MG/250ML IV SOLN
1250.0000 mg | Freq: Once | INTRAVENOUS | Status: AC
  Administered 2020-04-26: 1250 mg via INTRAVENOUS
  Filled 2020-04-26: qty 250

## 2020-04-26 MED ORDER — INSULIN ASPART 100 UNIT/ML ~~LOC~~ SOLN
0.0000 [IU] | SUBCUTANEOUS | Status: DC
  Administered 2020-04-27: 4 [IU] via SUBCUTANEOUS
  Administered 2020-04-27: 2 [IU] via SUBCUTANEOUS
  Filled 2020-04-26 (×2): qty 1

## 2020-04-26 MED ORDER — INSULIN GLARGINE 100 UNIT/ML ~~LOC~~ SOLN
22.0000 [IU] | Freq: Once | SUBCUTANEOUS | Status: DC
  Filled 2020-04-26: qty 0.22

## 2020-04-26 NOTE — ED Notes (Signed)
Lab at bedside, pt with noted self limiting episode of tachycardia, HR in the 140's-160's. This RN obtained copy of strip and placed in patient's chart. EDP Kinner notified and reviewed strip, Dr. Belia Heman also notified at this time.

## 2020-04-26 NOTE — ED Notes (Addendum)
This RN consulted with Dr. Belia Heman regarding results of repeat BMET. VORB to hold 4th run IV potassium, and repeat BMP at 11am. Will continue to monitor and adjust insulin drip per endotool as ordered.

## 2020-04-26 NOTE — ED Notes (Signed)
This RN and Neurosurgeon to bedside at this time. Pt visualized resting in bed with eyes closed and NAD at this time.  Introduced self to pt at this time. Pt denies further needs at this time.

## 2020-04-26 NOTE — ED Notes (Signed)
Lab paged for AM labs

## 2020-04-26 NOTE — Progress Notes (Signed)
Pt resting comfortably in bed, vss, and in no acute distress.  Will continue to monitor and assess pt  Sonda Rumble, [facility]  Pulmonary/Critical Care Pager [phone] (please enter 7 digits) PCCM Consult Pager [phone] (please enter 7 digits)

## 2020-04-26 NOTE — Progress Notes (Signed)
BP (!) 128/58   Pulse 85   Temp 98.9 F (37.2 C) (Oral)   Resp 15   Wt 59 kg   SpO2 99%   BMI 23.04 kg/m   Off insulin drip and off Bicarb infusion Transfer to [facility]

## 2020-04-26 NOTE — ED Notes (Signed)
Insulin drip adjusted per Endotool at this time. Pt turned to R side using pillows to prevent skin break down, pt states she is not comfortable anymore, however states understanding as to why she is being turned, pt then back to sleep. This RN continues to await Lantus to be sent by pharmacy so that patient can be weaned off insulin drip. VSS at this time.

## 2020-04-26 NOTE — ED Notes (Signed)
Introduced self to pt. Pt answering questions appropriately. Oriented to time, place situation and person. NAD noted. Pt resting.  Call bell within reach. Stretcher locked in lowest position.  External catheter in place

## 2020-04-26 NOTE — ED Notes (Addendum)
This RN called and spoke with pharmacy regarding Lantus. This RN spoke with Roselyn Bering, pharmacy, per Darl Pikes will reschedule 2200 dose of 20units Lantus for now so that patient can be weaned off of insulin drip and start 20units of Lantus at 2200 on 9/23.

## 2020-04-26 NOTE — ED Notes (Signed)
Pt turned from back to L side using pillows to prevent skin breakdown. Pt endorses that she is comfortable. VSS at this time. Lights dimmed for patient comfort.

## 2020-04-26 NOTE — ED Notes (Signed)
Insulin drip adjusted as directed by Endotool. Pt tolerated well. Pt continues to rest in bed with eyes closed. Purewick suction cannister emptied by this RN. Pt visualized resting in bed with eyes closed, lights dimmed for patient comfort.

## 2020-04-26 NOTE — ED Notes (Signed)
CBG obtained by this RN. Insulin drip adjusted by this RN per endotool. Dr. Belia Heman made aware of patient's BMET resulting, see orders. Awaiting Levemir to arrive from pharmacy to administer. Will continue per protocol. Pt continues to rest in bed with eyes closed. Lights remain dimmed for patient comfort.

## 2020-04-26 NOTE — ED Notes (Signed)
Pt provided dinner tray at bedside. Declines eating at this time.  Continues to be responsive to verbal stimuli and oriented.

## 2020-04-26 NOTE — Progress Notes (Signed)
This is a nonbillable note.  Vicki [name] is a 78 year old female with a past medical history significant for dementia, stroke, hypertension, and diabetes who presents to [facility] ED on 04/25/2020 due to altered mental status.  Patient had a glucose of 688, bicarb 7.0, potassium 6.2 and a pH of 7.05.  She was diagnosed with severe diabetic ketoacidosis.  She was placed on insulin drip. Her anion gap closed finally.  She is transferred to medical floor. Patient is also covered with cefepime and vancomycin for severe leukocytosis, procalcitonin level elevated at 1.67.  Chest x-ray has no acute changes.  Patient has no hypoxemia.  UA normal.  Pending blood culture results.  Patient currently on 20 units of Lantus, glucose better controlled. We will pick up patient care on 9/23.

## 2020-04-26 NOTE — Progress Notes (Signed)
PHARMACY CONSULT NOTE - FOLLOW UP  Pharmacy Consult for Electrolyte Monitoring and Replacement   Recent Labs: Potassium (mmol/L)  Date Value  04/26/2020 4.7  04/09/2012 4.0   Magnesium (mg/dL)  Date Value  82/42/3536 2.0  04/05/2012 2.4   Calcium (mg/dL)  Date Value  14/43/1540 8.9   Calcium, Total (mg/dL)  Date Value  08/67/6195 8.8   Albumin (g/dL)  Date Value  09/32/6712 3.4 (L)  02/17/2012 3.2 (L)   Phosphorus (mg/dL)  Date Value  45/80/9983 1.7 (L)   Sodium (mmol/L)  Date Value  04/26/2020 142  04/09/2012 142     Assessment: Pharmacy consulted to replace electrolytes in this 79 year old female with DKA and sepsis. Patient is currently on Insulin drip. BMP q4h.  9/22 08:41 - Electrolytes within range, SCr improving, Glucose trending down, Anion gap 28>16.  Goal of Therapy:  Electrolytes WNL   Plan:  No supplementation needed at this time. Will continue q4h BMP checks while on Insulin drip.  Clovia Cuff, PharmD, BCPS 04/26/2020 10:31 AM

## 2020-04-26 NOTE — ED Notes (Signed)
Assumed care of pt at 2300, alert and follows commands, disoriented to place, time, and situation. Lab at bedside to obtain cultures. Insulin titrated per endotool recommendations, noted in MAR. Denies pain at this time. Pt provided with warm blankets. Talking in small phrases with regular and unlabored breathing.

## 2020-04-26 NOTE — ED Notes (Signed)
Insulin drip adjusted per Endotool. Pt tolerated well. Pt remains resting in bed with eyes closed. VSS at this time.

## 2020-04-26 NOTE — Progress Notes (Signed)
Pharmacy Antibiotic Note  Dicy Smigel is a 79 y.o. female admitted on 04/25/2020 with sepsis.  Pharmacy has been consulted for Vancomycin, Cefepime dosing.  Plan: Vancomycin 1250 mg IV X 1 ordered for 09/22 @ ~ 0500. Vancomycin 500 mg IV Q24H   Cefepime 2 gm IV Q24H ordered to start on 9/22 @ 0500.   Weight: 59 kg (130 lb 1.1 oz)  Temp (24hrs), Avg:98.9 F (37.2 C), Min:98.9 F (37.2 C), Max:98.9 F (37.2 C)  Recent Labs  Lab 04/19/20 1802 04/19/20 2157 04/20/20 0712 04/20/20 1737 04/21/20 0501 04/21/20 0501 04/22/20 0437 04/25/20 1936 04/25/20 2200 04/26/20 0011 04/26/20 0152  WBC   < >  --  24.8*  --  20.7*  --  11.5* 22.5*  --   --  27.6*  CREATININE   < >  --  1.34*   < > 1.09*   < > 0.77 1.58* 1.61* 1.73* 1.64*  LATICACIDVEN  --  9.5*  --   --   --   --   --   --   --   --   --    < > = values in this interval not displayed.    Estimated Creatinine Clearance: 22.3 mL/min (A) (by C-G formula based on SCr of 1.64 mg/dL (H)).    No Known Allergies  Antimicrobials this admission:   >>    >>   Dose adjustments this admission:   Microbiology results:  BCx:   UCx:    Sputum:    MRSA PCR:   Thank you for allowing pharmacy to be a part of this patient's care.  Klynn Linnemann D 04/26/2020 4:29 AM

## 2020-04-26 NOTE — ED Notes (Signed)
Repeat BMP collected by this RN from [facility], sent to lab Ivery Quale as discussed. Pt repositioned in bed at this time by this RN and Jaclynn Guarneri, Charity fundraiser. Pt awakens with physical stimuli, pt unable to have conversation past, moans and "I'm cold". Purewick remains in place at this time, blankets placed back on patient by this RN and Jaclynn Guarneri, Charity fundraiser. VSS at this time.

## 2020-04-26 NOTE — Progress Notes (Signed)
Inpatient Diabetes Program Recommendations  AACE/ADA: New Consensus Statement on Inpatient Glycemic Control   Target Ranges:  Prepandial:   less than 140 mg/dL      Peak postprandial:   less than 180 mg/dL (1-2 hours)      Critically ill patients:  140 - 180 mg/dL   Results for [name], Vicki [name] (MRN [id number]) as of 04/26/2020 09:27  Ref. Range 04/26/2020 00:53 04/26/2020 01:25 04/26/2020 02:28 04/26/2020 03:28 04/26/2020 04:27 04/26/2020 05:34 04/26/2020 06:38 04/26/2020 07:41 04/26/2020 08:42  Glucose-Capillary Latest Ref Range: 70 - 99 mg/dL 150 (H) 569 (H) 794 (H) 230 (H) 212 (H) 218 (H) 197 (H) 153 (H) 141 (H)  Results for [name], Vicki [name] (MRN [id number]) as of 04/26/2020 09:27  Ref. Range 04/25/2020 19:36 04/25/2020 22:00  Beta-Hydroxybutyric Acid Latest Ref Range: 0.05 - 0.27 mmol/L  >8.00 (H)  Glucose Latest Ref Range: 70 - 99 mg/dL 827 (HH) 078 (HH)  Results for [name], Vicki [name] (MRN [id number]) as of 04/26/2020 09:27  Ref. Range 10/07/2019 02:58 01/20/2020 04:37 04/19/2020 20:46  Hemoglobin A1C Latest Ref Range: 4.8 - 5.6 % 12.6 (H) 11.5 (H) 13.0 (H)   Review of Glycemic Control  Diabetes history: DM1  Outpatient Diabetes medications: Lantus 30 units QHS, Humalog 4 units TID with meals plus correction scale TID with meals Current orders for Inpatient glycemic control: IV insulin  Inpatient Diabetes Program Recommendations:    Insulin: Once MD is ready to transition patient from IV to SQ insulin, please consider ordering Lantus 20 units QHS, CBGs Q4H, Novolog 0-9 units Q4H, and when diet ordered please order Novolog 3 units TID with meals for meal coverage if patient eats at least 50% of meals.   NOTE: Patient was recently inpatient 04/19/20-04/22/20 for DKA (and per H&P on 04/19/20 patient has been hospital in [place] 2 weeks prior due to issues with blood sugars). Patient was also inpatient 01/19/20-01/25/20 with hypoglycemia and diabetes coordinator spoke to patient at length on 01/20/20 and  asked patient to have her cousin (whom she lives with) watch her take her insulin to be sure she was consistently taking it.   Patient was being seen by Dr. Tedd Sias (Endocrinology) and her last office visit was on 12/03/19.  Noted a telephone note on 04/06/20 by Dr. Tedd Sias that patient will be sent a letter dismissing her from her practice. Patient back to hospital on 04/25/20 with altered mental status and in DKA.  Anticipate patient needs assistance with insulin administration to ensure it is taken consistently as prescribed to prevent reoccurrences of DKA.   Thanks, Orlando Penner, RN, MSN, CDE Diabetes Coordinator Inpatient Diabetes Program [phone] (Team Pager from 8am to 5pm)

## 2020-04-26 NOTE — Progress Notes (Signed)
PHARMACY CONSULT NOTE - FOLLOW UP  Pharmacy Consult for Electrolyte Monitoring and Replacement   Recent Labs: Potassium (mmol/L)  Date Value  04/26/2020 3.6  04/09/2012 4.0   Magnesium (mg/dL)  Date Value  40/98/1191 2.0  04/05/2012 2.4   Calcium (mg/dL)  Date Value  47/82/9562 8.6 (L)   Calcium, Total (mg/dL)  Date Value  13/03/6577 8.8   Albumin (g/dL)  Date Value  46/96/2952 3.4 (L)  02/17/2012 3.2 (L)   Phosphorus (mg/dL)  Date Value  84/13/2440 1.7 (L)   Sodium (mmol/L)  Date Value  04/26/2020 141  04/09/2012 142     Assessment: Pharmacy consulted to replace electrolytes in this 79 year old female with sepsis.   9/22:  Ca = 8.6,  Albumin = 3.4,  Corrected Ca = 9.08 All other electrolytes WNL   Goal of Therapy:  Electrolytes WNL   Plan:  No electrolyte supplementation needed at this time.  Will recheck electrolytes on 9/23 with AM labs.   Scherrie Gerlach ,PharmD Clinical Pharmacist 04/26/2020 4:36 AM

## 2020-04-27 DIAGNOSIS — J028 Acute pharyngitis due to other specified organisms: Secondary | ICD-10-CM

## 2020-04-27 LAB — BASIC METABOLIC PANEL
Anion gap: 9 (ref 5–15)
BUN: 28 mg/dL — ABNORMAL HIGH (ref 8–23)
CO2: 29 mmol/L (ref 22–32)
Calcium: 8.4 mg/dL — ABNORMAL LOW (ref 8.9–10.3)
Chloride: 101 mmol/L (ref 98–111)
Creatinine, Ser: 0.96 mg/dL (ref 0.44–1.00)
GFR calc Af Amer: >60 mL/min (ref >60)
GFR calc non Af Amer: 55 mL/min — ABNORMAL LOW (ref >60)
Glucose, Bld: 128 mg/dL — ABNORMAL HIGH (ref 70–99)
Potassium: 3.2 mmol/L — ABNORMAL LOW (ref 3.5–5.1)
Sodium: 139 mmol/L (ref 135–145)

## 2020-04-27 LAB — GLUCOSE, CAPILLARY
Glucose-Capillary: 115 mg/dL — ABNORMAL HIGH (ref 70–99)
Glucose-Capillary: 150 mg/dL — ABNORMAL HIGH (ref 70–99)
Glucose-Capillary: 162 mg/dL — ABNORMAL HIGH (ref 70–99)
Glucose-Capillary: 163 mg/dL — ABNORMAL HIGH (ref 70–99)
Glucose-Capillary: 174 mg/dL — ABNORMAL HIGH (ref 70–99)
Glucose-Capillary: 213 mg/dL — ABNORMAL HIGH (ref 70–99)
Glucose-Capillary: 219 mg/dL — ABNORMAL HIGH (ref 70–99)

## 2020-04-27 LAB — CBC WITH DIFFERENTIAL/PLATELET
Abs Immature Granulocytes: 0.15 10*3/uL — ABNORMAL HIGH (ref 0.00–0.07)
Basophils Absolute: 0 10*3/uL (ref 0.0–0.1)
Basophils Relative: 0 %
Eosinophils Absolute: 0.1 10*3/uL (ref 0.0–0.5)
Eosinophils Relative: 1 %
HCT: 32.6 % — ABNORMAL LOW (ref 36.0–46.0)
Hemoglobin: 10.8 g/dL — ABNORMAL LOW (ref 12.0–15.0)
Immature Granulocytes: 1 %
Lymphocytes Relative: 20 %
Lymphs Abs: 3 10*3/uL (ref 0.7–4.0)
MCH: 31.1 pg (ref 26.0–34.0)
MCHC: 33.1 g/dL (ref 30.0–36.0)
MCV: 93.9 fL (ref 80.0–100.0)
Monocytes Absolute: 1.4 10*3/uL — ABNORMAL HIGH (ref 0.1–1.0)
Monocytes Relative: 10 %
Neutro Abs: 10.2 10*3/uL — ABNORMAL HIGH (ref 1.7–7.7)
Neutrophils Relative %: 68 %
Platelets: 215 10*3/uL (ref 150–400)
RBC: 3.47 MIL/uL — ABNORMAL LOW (ref 3.87–5.11)
RDW: 14.3 % (ref 11.5–15.5)
WBC: 14.9 10*3/uL — ABNORMAL HIGH (ref 4.0–10.5)
nRBC: 0 % (ref 0.0–0.2)

## 2020-04-27 LAB — MAGNESIUM: Magnesium: 1.8 mg/dL (ref 1.7–2.4)

## 2020-04-27 LAB — PHOSPHORUS: Phosphorus: 2 mg/dL — ABNORMAL LOW (ref 2.5–4.6)

## 2020-04-27 LAB — PROCALCITONIN: Procalcitonin: 2.37 ng/mL

## 2020-04-27 MED ORDER — ACETAMINOPHEN 500 MG PO TABS
1000.0000 mg | ORAL_TABLET | Freq: Four times a day (QID) | ORAL | Status: DC | PRN
  Administered 2020-04-27 – 2020-04-28 (×4): 1000 mg via ORAL
  Filled 2020-04-27 (×4): qty 2

## 2020-04-27 MED ORDER — AZITHROMYCIN 250 MG PO TABS
500.0000 mg | ORAL_TABLET | Freq: Every day | ORAL | Status: AC
  Administered 2020-04-27 – 2020-04-29 (×3): 500 mg via ORAL
  Filled 2020-04-27 (×3): qty 2

## 2020-04-27 MED ORDER — INSULIN ASPART 100 UNIT/ML ~~LOC~~ SOLN
0.0000 [IU] | Freq: Three times a day (TID) | SUBCUTANEOUS | Status: DC
  Administered 2020-04-27: 2 [IU] via SUBCUTANEOUS
  Administered 2020-04-27: 3 [IU] via SUBCUTANEOUS
  Administered 2020-04-28 (×2): 2 [IU] via SUBCUTANEOUS
  Administered 2020-04-28: 1 [IU] via SUBCUTANEOUS
  Administered 2020-04-29: 2 [IU] via SUBCUTANEOUS
  Administered 2020-04-29: 1 [IU] via SUBCUTANEOUS
  Filled 2020-04-27 (×7): qty 1

## 2020-04-27 MED ORDER — SODIUM CHLORIDE 0.9 % IV SOLN
2.0000 g | Freq: Two times a day (BID) | INTRAVENOUS | Status: DC
  Filled 2020-04-27: qty 2

## 2020-04-27 MED ORDER — PRAVASTATIN SODIUM 20 MG PO TABS
40.0000 mg | ORAL_TABLET | Freq: Every day | ORAL | Status: DC
  Administered 2020-04-27 – 2020-04-28 (×2): 40 mg via ORAL
  Filled 2020-04-27 (×2): qty 2

## 2020-04-27 MED ORDER — HYDRALAZINE HCL 50 MG PO TABS
50.0000 mg | ORAL_TABLET | Freq: Three times a day (TID) | ORAL | Status: DC
  Administered 2020-04-27 – 2020-04-29 (×7): 50 mg via ORAL
  Filled 2020-04-27 (×7): qty 1

## 2020-04-27 MED ORDER — ASPIRIN EC 81 MG PO TBEC
81.0000 mg | DELAYED_RELEASE_TABLET | Freq: Every day | ORAL | Status: DC
  Administered 2020-04-27 – 2020-04-29 (×3): 81 mg via ORAL
  Filled 2020-04-27 (×3): qty 1

## 2020-04-27 MED ORDER — DONEPEZIL HCL 5 MG PO TABS
5.0000 mg | ORAL_TABLET | Freq: Every day | ORAL | Status: DC
  Administered 2020-04-27 – 2020-04-28 (×2): 5 mg via ORAL
  Filled 2020-04-27 (×3): qty 1

## 2020-04-27 MED ORDER — FAMOTIDINE 20 MG PO TABS
20.0000 mg | ORAL_TABLET | Freq: Every day | ORAL | Status: DC
  Administered 2020-04-27 – 2020-04-29 (×3): 20 mg via ORAL
  Filled 2020-04-27 (×3): qty 1

## 2020-04-27 MED ORDER — ISOSORBIDE MONONITRATE ER 30 MG PO TB24
30.0000 mg | ORAL_TABLET | Freq: Every day | ORAL | Status: DC
  Administered 2020-04-27 – 2020-04-29 (×3): 30 mg via ORAL
  Filled 2020-04-27 (×3): qty 1

## 2020-04-27 MED ORDER — POTASSIUM PHOSPHATES 15 MMOLE/5ML IV SOLN
20.0000 mmol | Freq: Once | INTRAVENOUS | Status: AC
  Administered 2020-04-27: 20 mmol via INTRAVENOUS
  Filled 2020-04-27: qty 6.67

## 2020-04-27 MED ORDER — KETOROLAC TROMETHAMINE 30 MG/ML IJ SOLN
30.0000 mg | Freq: Once | INTRAMUSCULAR | Status: AC
  Administered 2020-04-27: 30 mg via INTRAVENOUS
  Filled 2020-04-27: qty 1

## 2020-04-27 MED ORDER — VANCOMYCIN HCL 750 MG/150ML IV SOLN
750.0000 mg | INTRAVENOUS | Status: DC
  Filled 2020-04-27: qty 150

## 2020-04-27 MED ORDER — CITALOPRAM HYDROBROMIDE 20 MG PO TABS
20.0000 mg | ORAL_TABLET | Freq: Every day | ORAL | Status: DC
  Administered 2020-04-27 – 2020-04-29 (×3): 20 mg via ORAL
  Filled 2020-04-27 (×3): qty 1

## 2020-04-27 MED ORDER — MEMANTINE HCL 5 MG PO TABS
5.0000 mg | ORAL_TABLET | Freq: Two times a day (BID) | ORAL | Status: DC
  Administered 2020-04-27 – 2020-04-29 (×5): 5 mg via ORAL
  Filled 2020-04-27 (×5): qty 1

## 2020-04-27 MED ORDER — MAGNESIUM SULFATE 2 GM/50ML IV SOLN
2.0000 g | Freq: Once | INTRAVENOUS | Status: AC
  Administered 2020-04-27: 2 g via INTRAVENOUS
  Filled 2020-04-27: qty 50

## 2020-04-27 MED ORDER — SODIUM CHLORIDE 0.9 % IV SOLN
1.0000 g | INTRAVENOUS | Status: DC
  Administered 2020-04-27 – 2020-04-28 (×2): 1 g via INTRAVENOUS
  Filled 2020-04-27: qty 10
  Filled 2020-04-27: qty 1
  Filled 2020-04-27 (×2): qty 10
  Filled 2020-04-27: qty 1

## 2020-04-27 MED ORDER — LIVING WELL WITH DIABETES BOOK
Freq: Once | Status: AC
  Filled 2020-04-27: qty 1

## 2020-04-27 NOTE — Clinical Social Work Note (Signed)
This CSW working remote today. Attempted calling patient in room for readmission prevention screen but no answer. Will try again later.  Charlynn Court, CSW [phone]

## 2020-04-27 NOTE — Progress Notes (Signed)
PHARMACY CONSULT NOTE  Pharmacy Consult for Electrolyte Monitoring and Replacement   Recent Labs: Potassium (mmol/L)  Date Value  04/27/2020 3.2 (L)  04/09/2012 4.0   Magnesium (mg/dL)  Date Value  12/75/1700 1.8  04/05/2012 2.4   Calcium (mg/dL)  Date Value  17/49/4496 8.4 (L)   Calcium, Total (mg/dL)  Date Value  75/91/6384 8.8   Albumin (g/dL)  Date Value  66/59/9357 3.4 (L)  02/17/2012 3.2 (L)   Phosphorus (mg/dL)  Date Value  01/77/9390 2.0 (L)   Sodium (mmol/L)  Date Value  04/27/2020 139  04/09/2012 142     Assessment: Pharmacy consulted to replace electrolytes in this 79 year old female with a past medical history significant for dementia, stroke, hypertension, and diabetes who presents with DKA and sepsis. Patient was on an insulin drip but transitioned to subcutaneous insulin following closing of the anion gap. Her initial AKI has resolved  Goal of Therapy:  Electrolytes WNL   Plan:   Magnesium is borderline: replace with 2 grams IV magnesium sulfate x 1  20 mmol IV potassium phosphate (this provides 29 mEq IV potassium)  Re-check electrolytes with 9/24 am labs  Burnis Medin, PharmD 04/27/2020 7:07 AM

## 2020-04-27 NOTE — Progress Notes (Addendum)
Inpatient Diabetes Program Recommendations  AACE/ADA: New Consensus Statement on Inpatient Glycemic Control   Target Ranges:  Prepandial:   less than 140 mg/dL      Peak postprandial:   less than 180 mg/dL (1-2 hours)      Critically ill patients:  140 - 180 mg/dL   Results for Vicki [name], Vicki [name] (MRN [id number]) as of 04/27/2020 07:25  Ref. Range 04/26/2020 07:41 04/26/2020 08:42 04/26/2020 09:41 04/26/2020 10:40 04/26/2020 11:44 04/26/2020 12:41 04/26/2020 13:53 04/26/2020 14:42 04/26/2020 16:05 04/26/2020 19:02 04/26/2020 20:26 04/27/2020 00:13 04/27/2020 04:13  Glucose-Capillary Latest Ref Range: 70 - 99 mg/dL 160 (H) 109 (H) 323 (H) 127 (H) 121 (H) 140 (H) 128 (H) 103 (H) 105 (H) 98 84 162 (H) 115 (H)   Review of Glycemic Control  Diabetes history:DM1 Outpatient Diabetes medications:Lantus 30units QHS, Humalog 4 units TID with meals plus correction scaleTID with meals Current orders for Inpatient glycemic control: Lantus 20 units daily, Novolog 0-9 units Q4H  Inpatient Diabetes Program Recommendations:    Insulin: Please consider changing CBGs to AC&HS, Novolog 0-9 units TID with meals and Novolog 0-5 units QHS, and ordering Novolog 3 units TID with meals for meal coverage if patient eats at least 50% of meals.  NOTE: Spoke with patient over the phone regarding DM control. Patient states that she lives with her cousin but her cousin is not in great health herself. Patient reports that she is suppose to take Lantus and Humalog and she admits that she has been feeling so bad lately that she has not taken her insulin. She states that her cousin tells her that she needs to get up and take her insulin but she states she just hasn't been able to get around well lately. She notes that she fell not long ago when she got up to get her insulin as she didn't get her walker.  Patient reports that her bilateral legs (above the knee) ache like a toothache which has been going on for at least a week.  Patient  states that she also has dementia so she has a hard time remembering things so she tries to be sure she writes down things and like reminder notes so she can remember what she needs to do. Discussed that since she has Type 1 DM, she requires insulin for glucose metabolism and she can develop DKA if she is not taking insulin consistently. Explained that she has been in DKA several times as a result of not taking her insulin. Patient states that she did not think she needed to take her insulin if she is sick and feeling bad especially when she isn't eating.  Explained that since she does not make any insulin, she needs to consistently take her Lantus and if she is not eating then she would not take the Humalog 4 units since it is for meal coverage. However, she may still need to take the Humalog correction if glucose is elevated. Patient states she will try to do better and she reports that she has talked with her baby sister and she is going to start helping her to be sure she takes her insulin consistently. Informed patient that I would order a Living Well with DM book that she can read and that her sister can read to learn more and be more knowledgeable about DM.  Patient asked that all her medications be written out on paper so she can post it to use as a reminder of what she is suppose to take.  Encouraged patient to be sure to have family help her so she can get DM under control and decrease risk of developing DKA again. Informed patient that I would like attend provider know about her bilateral leg pain and see if PT needs to evaluate her due to recent fall and difficulty with mobility. Patient appreciative of information and states she has no questions at this time. Patient would greatly benefit from having family to administer insulin injections for her consistently.   Thanks, Orlando Penner, RN, MSN, CDE Diabetes Coordinator Inpatient Diabetes Program [phone] (Team Pager from 8am to 5pm)

## 2020-04-27 NOTE — Progress Notes (Signed)
PT Cancellation Note  Patient Details Name: Vicki [name] MRN: [id number] DOB: 12/19/1940   Cancelled Treatment:    Reason Eval/Treat Not Completed: Patient declined, no reason specified Entered pt's room, she was pleasantly confused and did not well recall seeing this PT last week while she was here.  She had just finished her dinner and reports "I'm a diabetic and when I eat I get sick on my stomach."  She did not wish to do any real activity this date, agrees to work with PT tomorrow AM.    Malachi Pro, DPT 04/27/2020, 5:37 PM

## 2020-04-27 NOTE — Progress Notes (Addendum)
PROGRESS NOTE    Vicki [name]  WUJ:[id number] DOB: [birth year] DOA: 04/25/2020 PCP: Adin Hector, MD   Chief complaint.  Generalized weakness.  Brief Narrative:  Vicki [name] is a 79 year old female with a past medical history significant for dementia, stroke, hypertension, and diabetes who presents to [facility] ED on 04/25/2020 due to altered mental status.  Patient had a glucose of 688, bicarb 7.0, potassium 6.2 and a pH of 7.05.  She was diagnosed with severe diabetic ketoacidosis.  She was placed on insulin drip. Her anion gap closed finally.  She is transferred to medical floor. Patient is also covered with cefepime and vancomycin for severe leukocytosis, procalcitonin level elevated at 1.67.  Chest x-ray has no acute changes.  Patient has no hypoxemia.  UA normal.  Pending blood culture results.  9/23.  Procalcitonin level increased to 2.37, clinically, patient has evidence of acute pharyngitis.  Antibiotic switched to Zithromax and Rocephin.  Assessment & Plan:   Active Problems:   DKA (diabetic ketoacidoses) ([place])  #1.  Uncontrolled type 2 diabetes with diabetic ketoacidosis. Condition has improved.  Currently she is on 20 units of Lantus, she will be given sliding scale insulin 3 times a day, avoiding nighttime insulin.  #2.  Acute bacterial pharyngitis. Patient had a persistent elevation of procalcitonin level.  Chest x-ray does not show any pneumonia, UA was normal.  No GI symptoms.  No abdominal tenderness to indicate gastritis, appendicitis or cholecystitis.  She has some sore throat, has enlarged and tender lymph nodes in the submandibular area.  She will be treated with antibiotics including Zithromax and Rocephin.  Recheck procalcitonin level tomorrow.  3.  Hypokalemia and hypophosphatemia. Supplement.  4.  Essential hypertension. Restart some home medicines.  5.  Dementia. Start home medicines.  6.  Generalized weakness. Start physical therapy.  Discussed with  the social worker, met with patient daughter.  Patient had a recurrent admission to the hospital recently.  It appears that patient has not been compliant with her medicines and insulin shots, partly due to dementia.  Discussed with patient daughter, will set up home care at time of discharge.  She will make sure that her family members will visit the patient frequently to make sure she take her medicines.  She also will make sure that patient will eat 3 meals, currently patient has not been eating well.  DVT prophylaxis: Heparin Code Status: DNR Family Communication: Updated patient daughter.  .   Status is: Inpatient  Remains inpatient appropriate because:Inpatient level of care appropriate due to severity of illness.  Patient has significant infection, possible pharyngitis, but not entirely sure.  Will need to treat and monitor patient for 1 more day before discharge.  Dispo: The patient is from: Home              Anticipated d/c is to: Home              Anticipated d/c date is: 1 day              Patient currently is not medically stable to d/c.        I/O last 3 completed shifts: In: 7262.9 [I.V.:4663.8; IV Piggyback:2599.2] Out: 600 [Urine:600] No intake/output data recorded.     Consultants:   None  Procedures: None  Antimicrobials: Rocephin and Zithromax.  Subjective: Patient still complaining of significant weakness, she states that she had  frequent frequent falls at home.  No injury occurred before. He denies any short of  breath or cough. No fever or chills. No abdominal pain or nausea vomiting or diarrhea. Denies any dysuria hematuria. Denies any skin rash or skin breakdown.  Objective: Vitals:   04/27/20 0012 04/27/20 0416 04/27/20 0444 04/27/20 0936  BP: (!) 161/64 (!) 148/67  (!) 163/73  Pulse: 71 71  72  Resp: $Remo'16 15  14  'OknPs$ Temp: (!) 97.3 F (36.3 C) 98 F (36.7 C)  98.5 F (36.9 C)  TempSrc: Oral Oral  Oral  SpO2: 100% 100%  99%  Weight:   65.4  kg   Height:        Intake/Output Summary (Last 24 hours) at 04/27/2020 1121 Last data filed at 04/27/2020 0109 Gross per 24 hour  Intake 1709.55 ml  Output 200 ml  Net 1509.55 ml   Filed Weights   04/25/20 1931 04/26/20 2252 04/27/20 0444  Weight: 59 kg 65.8 kg 65.4 kg    Examination:  General exam: Appears calm and comfortable  Respiratory system: Clear to auscultation. Respiratory effort normal. Cardiovascular system: S1 & S2 heard, RRR. No JVD, murmurs, rubs, gallops or clicks. No pedal edema. Gastrointestinal system: Abdomen is nondistended, soft and nontender. No organomegaly or masses felt. Normal bowel sounds heard. Central nervous system: Alert and oriented. No focal neurological deficits. Extremities: Symmetric  Skin: No rashes, lesions or ulcers Psychiatry:  Mood & affect appropriate.     Data Reviewed: I have personally reviewed following labs and imaging studies  CBC: Recent Labs  Lab 04/21/20 0501 04/22/20 0437 04/25/20 1936 04/26/20 0152 04/27/20 0450  WBC 20.7* 11.5* 22.5* 27.6* 14.9*  NEUTROABS 17.6* 8.3* 19.9* 21.9* 10.2*  HGB 10.8* 11.0* 11.7* 9.5* 10.8*  HCT 32.8* 32.1* 39.0 29.9* 32.6*  MCV 93.7 89.9 102.6* 97.4 93.9  PLT 237 183 241 221 323   Basic Metabolic Panel: Recent Labs  Lab 04/21/20 0501 04/21/20 0501 04/22/20 0437 04/25/20 1936 04/26/20 0011 04/26/20 0152 04/26/20 0841 04/26/20 1051 04/27/20 0450  NA 145   < > 142   < > 140 141 142 140 139  K 3.4*   < > 3.1*   < > 3.8 3.6 4.7 3.7 3.2*  CL 110   < > 106   < > 102 104 104 100 101  CO2 26   < > 29   < > <7* 9* $Remo'22 26 29  'zHkdM$ GLUCOSE 193*   < > 64*   < > 457* 339* 148* 133* 128*  BUN 38*   < > 26*   < > 35* 35* 33* 32* 28*  CREATININE 1.09*   < > 0.77   < > 1.73* 1.64* 1.12* 1.10* 0.96  CALCIUM 8.6*   < > 8.6*   < > 8.6* 8.6* 8.9 8.6* 8.4*  MG 2.1  --  2.0  --   --   --   --   --  1.8  PHOS  --   --   --   --   --   --   --   --  2.0*   < > = values in this interval not  displayed.   GFR: Estimated Creatinine Clearance: 41.8 mL/min (by C-G formula based on SCr of 0.96 mg/dL). Liver Function Tests: Recent Labs  Lab 04/25/20 1936  AST 45*  ALT 42  ALKPHOS 75  BILITOT 2.4*  PROT 6.6  ALBUMIN 3.4*   No results for input(s): LIPASE, AMYLASE in the last 168 hours. No results for input(s): AMMONIA in the last 168 hours. Coagulation  Profile: No results for input(s): INR, PROTIME in the last 168 hours. Cardiac Enzymes: No results for input(s): CKTOTAL, CKMB, CKMBINDEX, TROPONINI in the last 168 hours. BNP (last 3 results) No results for input(s): PROBNP in the last 8760 hours. HbA1C: No results for input(s): HGBA1C in the last 72 hours. CBG: Recent Labs  Lab 04/26/20 1902 04/26/20 2026 04/27/20 0013 04/27/20 0413 04/27/20 0746  GLUCAP 98 84 162* 115* 150*   Lipid Profile: No results for input(s): CHOL, HDL, LDLCALC, TRIG, CHOLHDL, LDLDIRECT in the last 72 hours. Thyroid Function Tests: No results for input(s): TSH, T4TOTAL, FREET4, T3FREE, THYROIDAB in the last 72 hours. Anemia Panel: No results for input(s): VITAMINB12, FOLATE, FERRITIN, TIBC, IRON, RETICCTPCT in the last 72 hours. Sepsis Labs: Recent Labs  Lab 04/25/20 2200 04/26/20 0152 04/27/20 0450  PROCALCITON 0.51 1.67 2.37    Recent Results (from the past 240 hour(s))  SARS Coronavirus 2 by RT PCR (hospital order, performed in [facility] hospital lab) Nasopharyngeal Nasopharyngeal Swab     Status: None   Collection Time: 04/19/20  7:07 PM   Specimen: Nasopharyngeal Swab  Result Value Ref Range Status   SARS Coronavirus 2 NEGATIVE NEGATIVE Final    Comment: (NOTE) SARS-CoV-2 target nucleic acids are NOT DETECTED.  The SARS-CoV-2 RNA is generally detectable in upper and lower respiratory specimens during the acute phase of infection. The lowest concentration of SARS-CoV-2 viral copies this assay can detect is 250 copies / mL. A negative result does not preclude SARS-CoV-2  infection and should not be used as the sole basis for treatment or other patient management decisions.  A negative result may occur with improper specimen collection / handling, submission of specimen other than nasopharyngeal swab, presence of viral mutation(s) within the areas targeted by this assay, and inadequate number of viral copies (<250 copies / mL). A negative result must be combined with clinical observations, patient history, and epidemiological information.  Fact Sheet for Patients:   StrictlyIdeas.no  Fact Sheet for Healthcare Providers: BankingDealers.co.za  This test is not yet approved or  cleared by the [place] FDA and has been authorized for detection and/or diagnosis of SARS-CoV-2 by FDA under an Emergency Use Authorization (EUA).  This EUA will remain in effect (meaning this test can be used) for the duration of the COVID-19 declaration under Section 564(b)(1) of the Act, 21 U.S.C. section 360bbb-3(b)(1), unless the authorization is terminated or revoked sooner.  Performed at [facility], [address] [place], [place] [address]   SARS Coronavirus 2 by RT PCR (hospital order, performed in [facility] hospital lab) Nasopharyngeal Nasopharyngeal Swab     Status: None   Collection Time: 04/25/20  8:07 PM   Specimen: Nasopharyngeal Swab  Result Value Ref Range Status   SARS Coronavirus 2 NEGATIVE NEGATIVE Final    Comment: (NOTE) SARS-CoV-2 target nucleic acids are NOT DETECTED.  The SARS-CoV-2 RNA is generally detectable in upper and lower respiratory specimens during the acute phase of infection. The lowest concentration of SARS-CoV-2 viral copies this assay can detect is 250 copies / mL. A negative result does not preclude SARS-CoV-2 infection and should not be used as the sole basis for treatment or other patient management decisions.  A negative result may occur with improper specimen  collection / handling, submission of specimen other than nasopharyngeal swab, presence of viral mutation(s) within the areas targeted by this assay, and inadequate number of viral copies (<250 copies / mL). A negative result must be combined  with clinical observations, patient history, and epidemiological information.  Fact Sheet for Patients:   StrictlyIdeas.no  Fact Sheet for Healthcare Providers: BankingDealers.co.za  This test is not yet approved or  cleared by the [place] FDA and has been authorized for detection and/or diagnosis of SARS-CoV-2 by FDA under an Emergency Use Authorization (EUA).  This EUA will remain in effect (meaning this test can be used) for the duration of the COVID-19 declaration under Section 564(b)(1) of the Act, 21 U.S.C. section 360bbb-3(b)(1), unless the authorization is terminated or revoked sooner.  Performed at [facility], [address] [place], [place] [address]   Culture, Urine     Status: Abnormal   Collection Time: 04/25/20 11:49 PM   Specimen: Urine, Clean Catch  Result Value Ref Range Status   Specimen Description   Final    URINE, CLEAN CATCH Performed at [facility], [address], [place], [place] [address]    Special Requests   Final    NONE Performed at [facility], [address] [place], [place] [address]    Culture MULTIPLE SPECIES PRESENT, SUGGEST RECOLLECTION (A)  Final   Report Status 04/26/2020 FINAL  Final  Culture, blood (routine x 2)     Status: None (Preliminary result)   Collection Time: 04/26/20 12:11 AM   Specimen: BLOOD  Result Value Ref Range Status   Specimen Description BLOOD RIGHT ANTECUBITAL  Final   Special Requests   Final    BOTTLES DRAWN AEROBIC AND ANAEROBIC Blood Culture adequate volume   Culture   Final    NO GROWTH 1 DAY Performed at [facility], [address], [place], [place] [address]    Report  Status PENDING  Incomplete  Culture, blood (routine x 2)     Status: None (Preliminary result)   Collection Time: 04/26/20  1:52 AM   Specimen: BLOOD  Result Value Ref Range Status   Specimen Description BLOOD RIGHT AC  Final   Special Requests   Final    BOTTLES DRAWN AEROBIC AND ANAEROBIC Blood Culture results may not be optimal due to an excessive volume of blood received in culture bottles   Culture   Final    NO GROWTH 1 DAY Performed at [facility], [address] [place], [place] [address]    Report Status PENDING  Incomplete         Radiology Studies: DG Chest Portable 1 View  Result Date: 04/25/2020 CLINICAL DATA:  Altered mental status and weakness. EXAM: PORTABLE CHEST 1 VIEW COMPARISON:  April 19, 2020 FINDINGS: There is no evidence of acute infiltrate, pleural effusion or pneumothorax. The heart size and mediastinal contours are within normal limits. Chronic left-sided rib fractures are seen. The visualized skeletal structures are otherwise unremarkable. IMPRESSION: No active disease. Electronically Signed   By: Virgina Norfolk M.D.   On: 04/25/2020 20:08        Scheduled Meds: . aspirin  81 mg Oral Daily  . azithromycin  500 mg Oral Daily  . citalopram  20 mg Oral Daily  . donepezil  5 mg Oral QHS  . famotidine  20 mg Oral Daily  . heparin  5,000 Units Subcutaneous Q8H  . hydrALAZINE  50 mg Oral TID  . insulin aspart  0-9 Units Subcutaneous TID AC  . insulin glargine  20 Units Subcutaneous Daily  . isosorbide mononitrate  30 mg Oral Daily  . memantine  5 mg Oral BID  . pravastatin  40  mg Oral q1800   Continuous Infusions: . cefTRIAXone (ROCEPHIN)  IV    . lactated ringers Stopped (04/27/20 0558)  . potassium PHOSPHATE IVPB (in mmol) 20 mmol (04/27/20 1024)     LOS: 2 days    Time spent: 38 minutes    Sharen Hones, MD [facility]   To contact the attending provider between 7A-7P or the covering provider during after  hours 7P-7A, please log into the web site [email or web address] and access using universal [place] password for that web site. If you do not have the password, please call the hospital operator.  04/27/2020, 11:21 AM

## 2020-04-28 DIAGNOSIS — K921 Melena: Secondary | ICD-10-CM

## 2020-04-28 LAB — CBC WITH DIFFERENTIAL/PLATELET
Abs Immature Granulocytes: 0.03 10*3/uL (ref 0.00–0.07)
Basophils Absolute: 0 10*3/uL (ref 0.0–0.1)
Basophils Relative: 0 %
Eosinophils Absolute: 0.1 10*3/uL (ref 0.0–0.5)
Eosinophils Relative: 2 %
HCT: 28.1 % — ABNORMAL LOW (ref 36.0–46.0)
Hemoglobin: 9.4 g/dL — ABNORMAL LOW (ref 12.0–15.0)
Immature Granulocytes: 1 %
Lymphocytes Relative: 40 %
Lymphs Abs: 2.6 10*3/uL (ref 0.7–4.0)
MCH: 31.6 pg (ref 26.0–34.0)
MCHC: 33.5 g/dL (ref 30.0–36.0)
MCV: 94.6 fL (ref 80.0–100.0)
Monocytes Absolute: 0.7 10*3/uL (ref 0.1–1.0)
Monocytes Relative: 11 %
Neutro Abs: 3 10*3/uL (ref 1.7–7.7)
Neutrophils Relative %: 46 %
Platelets: 185 10*3/uL (ref 150–400)
RBC: 2.97 MIL/uL — ABNORMAL LOW (ref 3.87–5.11)
RDW: 14.1 % (ref 11.5–15.5)
WBC: 6.5 10*3/uL (ref 4.0–10.5)
nRBC: 0 % (ref 0.0–0.2)

## 2020-04-28 LAB — BASIC METABOLIC PANEL
Anion gap: 7 (ref 5–15)
BUN: 18 mg/dL (ref 8–23)
CO2: 28 mmol/L (ref 22–32)
Calcium: 7.7 mg/dL — ABNORMAL LOW (ref 8.9–10.3)
Chloride: 105 mmol/L (ref 98–111)
Creatinine, Ser: 0.83 mg/dL (ref 0.44–1.00)
GFR calc Af Amer: >60 mL/min (ref >60)
GFR calc non Af Amer: >60 mL/min (ref >60)
Glucose, Bld: 191 mg/dL — ABNORMAL HIGH (ref 70–99)
Potassium: 3.4 mmol/L — ABNORMAL LOW (ref 3.5–5.1)
Sodium: 140 mmol/L (ref 135–145)

## 2020-04-28 LAB — GLUCOSE, CAPILLARY
Glucose-Capillary: 186 mg/dL — ABNORMAL HIGH (ref 70–99)
Glucose-Capillary: 196 mg/dL — ABNORMAL HIGH (ref 70–99)
Glucose-Capillary: 215 mg/dL — ABNORMAL HIGH (ref 70–99)
Glucose-Capillary: 144 mg/dL — ABNORMAL HIGH (ref 70–99)
Glucose-Capillary: 158 mg/dL — ABNORMAL HIGH (ref 70–99)

## 2020-04-28 LAB — CBC
HCT: 31.2 % — ABNORMAL LOW (ref 36.0–46.0)
Hemoglobin: 10.2 g/dL — ABNORMAL LOW (ref 12.0–15.0)
MCH: 30.9 pg (ref 26.0–34.0)
MCHC: 32.7 g/dL (ref 30.0–36.0)
MCV: 94.5 fL (ref 80.0–100.0)
Platelets: 198 10*3/uL (ref 150–400)
RBC: 3.3 MIL/uL — ABNORMAL LOW (ref 3.87–5.11)
RDW: 13.9 % (ref 11.5–15.5)
WBC: 6.9 10*3/uL (ref 4.0–10.5)
nRBC: 0 % (ref 0.0–0.2)

## 2020-04-28 LAB — PHOSPHORUS: Phosphorus: 2.6 mg/dL (ref 2.5–4.6)

## 2020-04-28 LAB — IRON AND TIBC
Iron: 65 ug/dL (ref 28–170)
Saturation Ratios: 37 % — ABNORMAL HIGH (ref 10.4–31.8)
TIBC: 178 ug/dL — ABNORMAL LOW (ref 250–450)
UIBC: 113 ug/dL

## 2020-04-28 LAB — MAGNESIUM: Magnesium: 2 mg/dL (ref 1.7–2.4)

## 2020-04-28 LAB — PROCALCITONIN: Procalcitonin: 1.18 ng/mL

## 2020-04-28 LAB — VITAMIN B12: Vitamin B-12: 1494 pg/mL — ABNORMAL HIGH (ref 180–914)

## 2020-04-28 MED ORDER — FLUTICASONE PROPIONATE 50 MCG/ACT NA SUSP
1.0000 | Freq: Every day | NASAL | Status: DC
  Administered 2020-04-28 – 2020-04-29 (×2): 1 via NASAL
  Filled 2020-04-28: qty 16

## 2020-04-28 MED ORDER — CEFDINIR 300 MG PO CAPS: 300.0000 mg | ORAL_CAPSULE | Freq: Two times a day (BID) | ORAL | 0 refills | Status: AC

## 2020-04-28 MED ORDER — AZITHROMYCIN 250 MG PO TABS: 500.0000 mg | ORAL_TABLET | Freq: Every day | ORAL | 0 refills | Status: AC

## 2020-04-28 MED ORDER — POTASSIUM CHLORIDE 20 MEQ PO PACK
40.0000 meq | PACK | Freq: Once | ORAL | Status: AC
  Administered 2020-04-28: 40 meq via ORAL
  Filled 2020-04-28: qty 2

## 2020-04-28 MED ORDER — TROLAMINE SALICYLATE 10 % EX CREA
TOPICAL_CREAM | CUTANEOUS | Status: DC | PRN
  Filled 2020-04-28: qty 85

## 2020-04-28 MED ORDER — PANTOPRAZOLE SODIUM 40 MG PO TBEC
40.0000 mg | DELAYED_RELEASE_TABLET | Freq: Two times a day (BID) | ORAL | Status: DC
  Administered 2020-04-28 – 2020-04-29 (×3): 40 mg via ORAL
  Filled 2020-04-28 (×3): qty 1

## 2020-04-28 MED ORDER — KETOROLAC TROMETHAMINE 30 MG/ML IJ SOLN
30.0000 mg | Freq: Once | INTRAMUSCULAR | Status: AC
  Administered 2020-04-28: 30 mg via INTRAVENOUS
  Filled 2020-04-28: qty 1

## 2020-04-28 MED ORDER — INSULIN GLARGINE 100 UNIT/ML ~~LOC~~ SOLN: 20.0000 [IU] | Freq: Every day | SUBCUTANEOUS | 11 refills | Status: DC

## 2020-04-28 NOTE — Care Management Important Message (Signed)
Important Message  Patient Details  Name: Vicki [name] MRN: [id number] Date of Birth: 1941-05-08   Medicare Important Message Given:  Yes  Initial Medicare IM given by Patient Access Associate on 04/27/2020 at 10:23am.     Johnell Comings 04/28/2020, 8:46 AM

## 2020-04-28 NOTE — Evaluation (Signed)
Physical Therapy Evaluation Patient Details Name: Vicki [name] MRN: [id number] DOB: 30-Jan-1941 Today's Date: 04/28/2020   History of Present Illness  79 y.o. here last week for DKA and AMS, returns for the same. PMH includes DM, htn, R sided lesion of sciatic nerve, stroke, Alzheimer's dementia, DKA, NSTEMI, h/o breast surgery.  Clinical Impression  Pt showed good overall confident and mobility with PT exam.  She was able to fully circumambulate the nurses' station with walker and relatively consistent cadence.  She did not have any LOBs or overt safety issues.  She apparently has been having trouble being consistent with her meds (insulin) at home, reiterated that she really does quite well with PT concerns, but that she will continue to have issues if she is not consistent with this.  Pt will need more consistent and regular assist with meds, etc to be able to remain safely in the home.      Follow Up Recommendations Home health PT;Supervision for mobility/OOB    Equipment Recommendations  None recommended by PT    Recommendations for Other Services       Precautions / Restrictions Precautions Precautions: Fall Restrictions Weight Bearing Restrictions: No      Mobility  Bed Mobility Overal bed mobility: Modified Independent                Transfers Overall transfer level: Modified independent Equipment used: Rolling walker (2 wheeled)             General transfer comment: Pt able to rise w/o signficant issues, good confidence  Ambulation/Gait Ambulation/Gait assistance: Min guard Gait Distance (Feet): 200 Feet Assistive device: Rolling walker (2 wheeled)       General Gait Details: Pt able to maintain consistent and confident gait around nurses' station, she did request 1 brief standing rest break (O2 and HR 90s t/o the ambulation bout)  Stairs            Wheelchair Mobility    Modified Rankin (Stroke Patients Only)       Balance Overall  balance assessment: Modified Independent                                           Pertinent Vitals/Pain Faces Pain Scale: Hurts a little bit Pain Location: reports chronic R leg pain and subacute (falling on ice ~9 months ago) L hip pain    Home Living Family/patient expects to be discharged to:: Private residence Living Arrangements: Other relatives (cousin) Available Help at Discharge: Family;Available PRN/intermittently Type of Home: House Home Access: Stairs to enter Entrance Stairs-Rails: Doctor, general practice of Steps: 3 Home Layout: One level Home Equipment: Cane - single point;Shower seat;Grab bars - tub/shower;Hand held Careers information officer - 2 wheels      Prior Function Level of Independence: Independent with assistive device(s);Needs assistance   Gait / Transfers Assistance Needed: Pt reports that she is able to get around with [facility] (walking to mailbox, light errands when others drive, etc)  ADL's / Homemaking Assistance Needed: minimal assist from cousin, reports that she does most of her ADLs, etc on her own        Hand Dominance   Dominant Hand: Right    Extremity/Trunk Assessment   Upper Extremity Assessment Upper Extremity Assessment: Overall WFL for tasks assessed;Generalized weakness    Lower Extremity Assessment Lower Extremity Assessment: Overall WFL for tasks assessed;Generalized weakness  Communication   Communication: No difficulties  Cognition Arousal/Alertness: Awake/alert Behavior During Therapy: WFL for tasks assessed/performed Overall Cognitive Status: History of cognitive impairments - at baseline                                 General Comments: Pt alert to self, knew month, year, setting (hospital/[place]) but had some general confusion      General Comments      Exercises     Assessment/Plan    PT Assessment Patient needs continued PT services  PT Problem List Decreased  strength;Decreased range of motion;Decreased balance;Decreased activity tolerance;Decreased mobility;Decreased knowledge of use of DME;Decreased safety awareness;Decreased cognition;Decreased coordination       PT Treatment Interventions DME instruction;Gait training;Stair training;Functional mobility training;Therapeutic activities;Therapeutic exercise;Balance training;Neuromuscular re-education;Patient/family education;Cognitive remediation    PT Goals (Current goals can be found in the Care Plan section)  Acute Rehab PT Goals Patient Stated Goal: go home PT Goal Formulation: With patient Time For Goal Achievement: 05/12/20 Potential to Achieve Goals: Good    Frequency Min 2X/week   Barriers to discharge        Co-evaluation               AM-PAC PT "6 Clicks" Mobility  Outcome Measure Help needed turning from your back to your side while in a flat bed without using bedrails?: None Help needed moving from lying on your back to sitting on the side of a flat bed without using bedrails?: None Help needed moving to and from a bed to a chair (including a wheelchair)?: A Little Help needed standing up from a chair using your arms (e.g., wheelchair or bedside chair)?: A Little Help needed to walk in hospital room?: A Little Help needed climbing 3-5 steps with a railing? : A Little 6 Click Score: 20    End of Session Equipment Utilized During Treatment: Gait belt Activity Tolerance: Patient tolerated treatment well Patient left: with call bell/phone within reach;with chair alarm set Nurse Communication: Mobility status PT Visit Diagnosis: Muscle weakness (generalized) (M62.81);Difficulty in walking, not elsewhere classified (R26.2);Unsteadiness on feet (R26.81)    Time: 1749-4496 PT Time Calculation (min) (ACUTE ONLY): 16 min   Charges:   PT Evaluation $PT Eval Low Complexity: 1 Low PT Treatments $Gait Training: 8-22 mins        Malachi Pro, DPT 04/28/2020, 11:17  AM

## 2020-04-28 NOTE — TOC Initial Note (Signed)
Transition of Care [facility]) - Initial/Assessment Note    Patient Details  Name: Vicki [name] MRN: [id number] Date of Birth: 03-08-1941  Transition of Care [facility]) CM/SW Contact:    Margarito Liner, LCSW Phone Number: 04/28/2020, 1:08 PM  Clinical Narrative:  Readmission prevention screen complete. CSW working remote. Called patient in room, introduced role, and explained that discharge planning would be discussed. PCP is Dr. Graciela Husbands. Patient uses [facility] to get to appointments although she stated they sometimes mess up the appointment dates/times and tell her she has to pay. Patient said she has Medicaid but it is not listed on the facesheet. Emailed Artist asking her to confirm. Pharmacy is [facility]. No issues obtaining medications. Patient was set up with [facility] for home health PT last admission. They can add a nurse for medication management and an aide. Patient has a cane and two-wheel walker at home. No further concerns. CSW encouraged patient to contact CSW as needed. CSW will continue to follow patient for support and facilitate return home when stable.                Expected Discharge Plan: Home w Home Health Services Barriers to Discharge: Continued Medical Work up   Patient Goals and CMS Choice     Choice offered to / list presented to : NA  Expected Discharge Plan and Services Expected Discharge Plan: Home w Home Health Services     Post Acute Care Choice: Resumption of Svcs/PTA Provider Living arrangements for the past 2 months: Single Family Home Expected Discharge Date: 04/28/20                         HH Arranged: RN, PT, Nurse's Aide HH Agency: [facility] Date [facility] Agency Contacted: 04/28/20   Representative spoke with at [facility] Agency: Becky Sax  Prior Living Arrangements/Services Living arrangements for the past 2 months: Single Family Home Lives with:: Relatives Patient language and need for interpreter reviewed::  Yes Do you feel safe going back to the place where you live?: Yes      Need for Family Participation in Patient Care: Yes (Comment) Care giver support system in place?: Yes (comment) Current home services: DME, Home PT Criminal Activity/Legal Involvement Pertinent to Current Situation/Hospitalization: No - Comment as needed  Activities of Daily Living Home Assistive Devices/Equipment: Cane (specify quad or straight) ADL Screening (condition at time of admission) Patient's cognitive ability adequate to safely complete daily activities?: Yes Is the patient deaf or have difficulty hearing?: No Does the patient have difficulty seeing, even when wearing glasses/contacts?: No Does the patient have difficulty concentrating, remembering, or making decisions?: Yes Patient able to express need for assistance with ADLs?: Yes Does the patient have difficulty dressing or bathing?: No Independently performs ADLs?: Yes (appropriate for developmental age) Does the patient have difficulty walking or climbing stairs?: Yes Weakness of Legs: Both Weakness of Arms/Hands: Both  Permission Sought/Granted Permission sought to share information with : Facility Industrial/product designer granted to share information with : Yes, Verbal Permission Granted     Permission granted to share info w AGENCY: [facility]        Emotional Assessment Appearance:: Appears stated age Attitude/Demeanor/Rapport: Engaged, Gracious Affect (typically observed): Accepting, Appropriate, Calm, Pleasant Orientation: : Oriented to Self, Oriented to Place, Oriented to  Time, Oriented to Situation Alcohol / Substance Use: Not Applicable Psych Involvement: No (comment)  Admission diagnosis:  DKA (diabetic ketoacidoses) (HCC) [E11.10]  Altered mental status, unspecified altered mental status type [R41.82] Diabetic ketoacidosis without coma associated with type 1 diabetes mellitus (HCC) [E10.10] Patient Active Problem List    Diagnosis Date Noted  . Hypernatremia 04/19/2020  . Hyperkalemia 04/19/2020  . Hyperlipidemia 04/19/2020  . Sepsis (HCC) 01/19/2020  . Alzheimer's dementia without behavioral disturbance (HCC)   . DKA (diabetic ketoacidoses) (HCC) 10/14/2019  . Diabetic ketoacidosis without coma associated with diabetes mellitus due to underlying condition (HCC)   . Dehydration 10/06/2019  . Generalized weakness 10/06/2019  . AKI (acute kidney injury) (HCC) 10/06/2019  . Hypertension associated with diabetes (HCC)   . Hypoglycemia 12/10/2017  . 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 NSTEMI (non-ST elevated myocardial infarction) (HCC) 10/01/2016  . 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 Diabetes (HCC) 09/29/2016  . GERD (gastroesophageal reflux disease) 09/29/2016  . Nausea and vomiting 03/24/2016  . Diffuse abdominal pain 03/24/2016  . Hypokalemia 03/24/2016  . Chest pain 03/24/2016  . Elevated troponin I level 09/26/2015  . 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HCC) 09/25/2015   PCP:  Lynnea Ferrier, MD Pharmacy:   [facility] - [place], [place] - [address] [place] [address] Phone: [phone] Fax: [phone]     Social Determinants of Health (SDOH) Interventions    Readmission Risk Interventions Readmission Risk Prevention Plan 04/28/2020 01/20/2020 10/15/2019  Transportation Screening Complete Complete Complete  PCP or Specialist Appt within 3-5 Days - Complete Complete  HRI or Home Care Consult - - Complete  Social Work Consult for Recovery Care Planning/Counseling - Complete -  Palliative Care Screening - Not Applicable -  Medication Review Oceanographer) Complete Complete Complete  PCP or Specialist appointment within 3-5 days of discharge Complete - -  HRI or Home Care Consult Complete - -  SW Recovery Care/Counseling Consult Complete - -  Palliative Care Screening Not Applicable - -   Skilled Nursing Facility Not Applicable - -  Some recent data might be hidden

## 2020-04-28 NOTE — Progress Notes (Addendum)
   04/28/20 1550  Clinical Encounter Type  Visited With Patient  Visit Type Follow-up  Referral From Chaplain  Consult/Referral To Chaplain  Chaplain stopped in to check on pt. Pt said her feet had been hurting like a tooth ache and they gave her something for pain and it has helped. She is the oldest of 105 children and she has 11 living siblings. Her siblings want her to be with them at bar-b-ques and other events, but she told them "I can't keep up with you."  She said she enjoys sitting at home singing and praying. Before leaving chaplain asked if she coume pray with there and pt said yes. Chaplain prayed and told pt her availability and left.

## 2020-04-28 NOTE — Discharge Summary (Addendum)
Physician Discharge Summary  Patient ID: Vicki [name] MRN: [id number] DOB/AGE: [birth year] 79 y.o.  Admit date: 04/25/2020 Discharge date: 04/28/2020  Admission Diagnoses:  Discharge Diagnoses:  Active Problems:   DKA (diabetic ketoacidoses) [facility])   Discharged Condition: good  Hospital Course:  Vicki [name] is a 79 year old female with a past medical history significant for dementia, stroke, hypertension, and diabetes who presents to [facility] ED on 04/25/2020 due to altered mental status.Patient had a glucose of 688, bicarb 7.0, potassium 6.2 and a pH of 7.05. She was diagnosed with severe diabetic ketoacidosis. She was placed on insulin drip. Her anion gap closed finally. She is transferred to medical floor. Patient is also covered with cefepime and vancomycin for severe leukocytosis, procalcitonin level elevated at 1.67. Chest x-ray has no acute changes. Patient has no hypoxemia. UA normal. Pending blood culture results.  9/23.  Procalcitonin level increased to 2.37, clinically, patient has evidence of acute pharyngitis.  Antibiotic switched to Zithromax and Rocephin.  9/24.  Condition improved.  Procalcitonin level dropped down to 1.18.  We will continue cefdinir and Zithromax for pharyngitis.  #1.  Uncontrolled type 2 diabetes with diabetic ketoacidosis. Condition has improved.   Patient was placed on insulin drip originally.  Change to Lantus 20 units, glucose seem to be okay with 20 units.  Patient has not been compliant with her insulin dose at home, at discharge, I will change from 30 units to 20 units and follow-up with PCP in the near future.   #2.  Acute bacterial pharyngitis. Patient had a persistent elevation of procalcitonin level.  Chest x-ray does not show any pneumonia, UA was normal.  No GI symptoms.  No abdominal tenderness to indicate gastritis, appendicitis or cholecystitis.  She has some sore throat, has enlarged and tender lymph nodes in the submandibular  area.  She will be treated with antibiotics including Zithromax and Rocephin.   Procalcitonin level dropped down after treatment, will continue Zithromax and cefdinir for a few days.  3.  Hypokalemia and hypophosphatemia. Supplemented.  We will give another dose of potassium orally before discharge.  4.  Essential hypertension. Restart home medicines.  5.  Dementia. Retart home medicines.  6.  Generalized weakness and frequent falls. We will set home physical therapy, home care and home care RN to follow-up.  1005. Patient had a loose black stool today. Will cancel discharge, monitor cbc, protonix. Check occult blood in stool.  Consults: None  Significant Diagnostic Studies:  Treatments: Insulin drip, rocephin, zithromax  Discharge Exam: Blood pressure (!) 150/74, pulse 76, temperature 98.7 F (37.1 C), temperature source Oral, resp. rate 20, height 5\' 3"  (1.6 m), weight 66 kg, SpO2 99 %. General appearance: alert and cooperative Resp: clear to auscultation bilaterally Cardio: regular rate and rhythm, S1, S2 normal, no murmur, click, rub or gallop GI: soft, non-tender; bowel sounds normal; no masses,  no organomegaly Extremities: extremities normal, atraumatic, no cyanosis or edema  Alert and oriented x2  Disposition: Discharge disposition: 01-Home or Self Care       Discharge Instructions    Diet - low sodium heart healthy   Complete by: As directed    Increase activity slowly   Complete by: As directed      Allergies as of 04/28/2020   No Known Allergies     Medication List    TAKE these medications   acetaminophen 500 MG tablet Commonly known as: TYLENOL Take 2 tablets (1,000 mg total) by mouth every 6 (six) hours as  needed for mild pain or headache.   aspirin 81 MG EC tablet Take 1 tablet (81 mg total) by mouth daily.   azithromycin 250 MG tablet Commonly known as: ZITHROMAX Take 2 tablets (500 mg total) by mouth daily for 2 days.   cefdinir 300  MG capsule Commonly known as: OMNICEF Take 1 capsule (300 mg total) by mouth 2 (two) times daily for 5 days.   Centrum Adults Tabs Take 1 tablet by mouth every morning.   citalopram 20 MG tablet Commonly known as: CELEXA Take 20 mg by mouth daily.   Combigan 0.2-0.5 % ophthalmic solution Generic drug: brimonidine-timolol Place 1 drop into both eyes daily.   donepezil 5 MG tablet Commonly known as: ARICEPT Take 5 mg by mouth at bedtime.   Ensure Max Protein Liqd Take 330 mLs (11 oz total) by mouth 2 (two) times daily between meals.   famotidine 20 MG tablet Commonly known as: PEPCID Take 20 mg by mouth daily.   HumaLOG 100 UNIT/ML injection Generic drug: insulin lispro Inject 4 Units into the skin 3 (three) times daily with meals. (plus sliding scale)   hydrALAZINE 50 MG tablet Commonly known as: APRESOLINE Take 50 mg by mouth 3 (three) times daily.   insulin glargine 100 UNIT/ML injection Commonly known as: LANTUS Inject 0.2 mLs (20 Units total) into the skin at bedtime. What changed: how much to take   isosorbide mononitrate 30 MG 24 hr tablet Commonly known as: IMDUR Take 30 mg by mouth daily.   loratadine 10 MG tablet Commonly known as: CLARITIN Take 10 mg by mouth daily.   losartan 50 MG tablet Commonly known as: COZAAR Take 50 mg by mouth at bedtime.   lovastatin 40 MG tablet Commonly known as: MEVACOR Take 40 mg by mouth daily after supper.   Magnesium Oxide 400 (240 Mg) MG Tabs Take 400 mg by mouth daily.   memantine 5 MG tablet Commonly known as: NAMENDA Take 5 mg by mouth 2 (two) times daily.   saccharomyces boulardii 250 MG capsule Commonly known as: FLORASTOR Take 250 mg by mouth 2 (two) times daily.   simethicone 80 MG chewable tablet Commonly known as: MYLICON Chew 1 tablet (80 mg total) by mouth 4 (four) times daily as needed for flatulence.       Follow-up Information    Curtis Sites III, MD Follow up in 1 week(s).   Specialty:  Internal Medicine Contact information: [address] [facility] [place] [address] [phone]        Raj Janus, MD .   Specialty: Endocrinology Contact information: [phone] [facility] [address] [facility] [place] [address] [phone]              36 minutes Signed: Marrion Coy 04/28/2020, 7:59 AM

## 2020-04-28 NOTE — Progress Notes (Signed)
PHARMACY CONSULT NOTE  Pharmacy Consult for Electrolyte Monitoring and Replacement   Recent Labs: Potassium (mmol/L)  Date Value  04/28/2020 3.4 (L)  04/09/2012 4.0   Magnesium (mg/dL)  Date Value  44/62/8638 2.0  04/05/2012 2.4   Calcium (mg/dL)  Date Value  17/71/1657 7.7 (L)   Calcium, Total (mg/dL)  Date Value  90/38/3338 8.8   Albumin (g/dL)  Date Value  32/91/9166 3.4 (L)  02/17/2012 3.2 (L)   Phosphorus (mg/dL)  Date Value  06/00/4599 2.6   Sodium (mmol/L)  Date Value  04/28/2020 140  04/09/2012 142     Assessment: Pharmacy consulted to replace electrolytes in this 79 year old female with a past medical history significant for dementia, stroke, hypertension, and diabetes who presents with DKA and sepsis. Patient was on an insulin drip but transitioned to subcutaneous insulin following closing of the anion gap. Her initial AKI has resolved  Goal of Therapy:  Electrolytes WNL   Plan:   K 3.4 - MD has ordered KCl 40 mEq PO x1  Re-check electrolytes with 9/25 am labs   Crist Fat, PharmD, BCPS Clinical Pharmacist 04/28/2020 7:48 AM

## 2020-04-28 NOTE — Progress Notes (Signed)
Inpatient Diabetes Program Recommendations  AACE/ADA: New Consensus Statement on Inpatient Glycemic Control (2015)  Target Ranges:  Prepandial:   less than 140 mg/dL      Peak postprandial:   less than 180 mg/dL (1-2 hours)      Critically ill patients:  140 - 180 mg/dL   Results for Vicki [name], Vicki [name] (MRN [id number]) as of 04/28/2020 12:58  Ref. Range 04/28/2020 07:35 04/28/2020 11:34  Glucose-Capillary Latest Ref Range: 70 - 99 mg/dL 355 (H) 732 (H)    Diabetes history:DM1  Outpatient Diabetes meds:Lantus 30units QHS              Humalog4 units TID with meals pluscorrection scaleTID with meals  Current Insulin Orders: Lantus 20 units daily        Novolog 0-9 units TID AC     MD- Please consider the following in-hospital insulin adjustments:  1. Increase Lantus slightly to 22 units Daily  2. Start low dose Novolog Meal Coverage: Novolog 3 units TID with meals  (Please add the following Hold Parameters: Hold if pt eats <50% of meal, Hold if pt NPO)    --Will follow patient during hospitalization--  Ambrose Finland RN, MSN, CDE Diabetes Coordinator Inpatient Glycemic Control Team Team Pager: [phone] (8a-5p)

## 2020-04-28 NOTE — Progress Notes (Signed)
Patient reported a dark stool earlier in the shift and Dr. Chipper Herb notified and will follow his orders.

## 2020-04-28 NOTE — Plan of Care (Signed)
Continuing with plan of care. 

## 2020-04-29 DIAGNOSIS — I10 Essential (primary) hypertension: Secondary | ICD-10-CM

## 2020-04-29 LAB — BASIC METABOLIC PANEL
Anion gap: 8 (ref 5–15)
BUN: 9 mg/dL (ref 8–23)
CO2: 28 mmol/L (ref 22–32)
Calcium: 8.4 mg/dL — ABNORMAL LOW (ref 8.9–10.3)
Chloride: 104 mmol/L (ref 98–111)
Creatinine, Ser: 0.66 mg/dL (ref 0.44–1.00)
GFR calc Af Amer: >60 mL/min (ref >60)
GFR calc non Af Amer: >60 mL/min (ref >60)
Glucose, Bld: 164 mg/dL — ABNORMAL HIGH (ref 70–99)
Potassium: 3.3 mmol/L — ABNORMAL LOW (ref 3.5–5.1)
Sodium: 140 mmol/L (ref 135–145)

## 2020-04-29 LAB — CBC WITH DIFFERENTIAL/PLATELET
Abs Immature Granulocytes: 0.02 10*3/uL (ref 0.00–0.07)
Basophils Absolute: 0 10*3/uL (ref 0.0–0.1)
Basophils Relative: 1 %
Eosinophils Absolute: 0.1 10*3/uL (ref 0.0–0.5)
Eosinophils Relative: 1 %
HCT: 31.4 % — ABNORMAL LOW (ref 36.0–46.0)
Hemoglobin: 10.6 g/dL — ABNORMAL LOW (ref 12.0–15.0)
Immature Granulocytes: 0 %
Lymphocytes Relative: 35 %
Lymphs Abs: 2 10*3/uL (ref 0.7–4.0)
MCH: 31.1 pg (ref 26.0–34.0)
MCHC: 33.8 g/dL (ref 30.0–36.0)
MCV: 92.1 fL (ref 80.0–100.0)
Monocytes Absolute: 0.6 10*3/uL (ref 0.1–1.0)
Monocytes Relative: 11 %
Neutro Abs: 2.9 10*3/uL (ref 1.7–7.7)
Neutrophils Relative %: 52 %
Platelets: 210 10*3/uL (ref 150–400)
RBC: 3.41 MIL/uL — ABNORMAL LOW (ref 3.87–5.11)
RDW: 14.2 % (ref 11.5–15.5)
WBC: 5.6 10*3/uL (ref 4.0–10.5)
nRBC: 0 % (ref 0.0–0.2)

## 2020-04-29 LAB — GLUCOSE, CAPILLARY
Glucose-Capillary: 136 mg/dL — ABNORMAL HIGH (ref 70–99)
Glucose-Capillary: 151 mg/dL — ABNORMAL HIGH (ref 70–99)
Glucose-Capillary: 161 mg/dL — ABNORMAL HIGH (ref 70–99)
Glucose-Capillary: 144 mg/dL — ABNORMAL HIGH (ref 70–99)
Glucose-Capillary: 149 mg/dL — ABNORMAL HIGH (ref 70–99)

## 2020-04-29 LAB — MAGNESIUM: Magnesium: 1.6 mg/dL — ABNORMAL LOW (ref 1.7–2.4)

## 2020-04-29 LAB — PHOSPHORUS: Phosphorus: 2.5 mg/dL (ref 2.5–4.6)

## 2020-04-29 MED ORDER — PANTOPRAZOLE SODIUM 40 MG PO TBEC: 40.0000 mg | DELAYED_RELEASE_TABLET | Freq: Two times a day (BID) | ORAL | 0 refills | Status: DC

## 2020-04-29 MED ORDER — DOCUSATE SODIUM 100 MG PO CAPS
100.0000 mg | ORAL_CAPSULE | Freq: Two times a day (BID) | ORAL | Status: DC
  Administered 2020-04-29: 100 mg via ORAL
  Filled 2020-04-29: qty 1

## 2020-04-29 MED ORDER — AMLODIPINE BESYLATE 5 MG PO TABS
5.0000 mg | ORAL_TABLET | Freq: Every day | ORAL | Status: DC
  Administered 2020-04-29: 5 mg via ORAL
  Filled 2020-04-29: qty 1

## 2020-04-29 MED ORDER — MAGNESIUM SULFATE 2 GM/50ML IV SOLN
2.0000 g | Freq: Once | INTRAVENOUS | Status: AC
  Administered 2020-04-29: 2 g via INTRAVENOUS
  Filled 2020-04-29: qty 50

## 2020-04-29 MED ORDER — POTASSIUM CHLORIDE 20 MEQ PO PACK
40.0000 meq | PACK | ORAL | Status: AC
  Administered 2020-04-29 (×2): 40 meq via ORAL
  Filled 2020-04-29 (×2): qty 2

## 2020-04-29 NOTE — Discharge Summary (Addendum)
Physician Discharge Summary  Patient ID: Vicki [name] MRN: [id number] DOB/AGE: 1941-01-25 79 y.o.  Admit date: 04/25/2020 Discharge date: 04/29/2020  Admission Diagnoses:  Discharge Diagnoses:  Active Problems:   DKA (diabetic ketoacidoses) [facility])   Discharged Condition: good  Hospital Course:  Vicki [name] is a 79 year old female with a past medical history significant for dementia, stroke, hypertension, and diabetes who presents to [facility] ED on 04/25/2020 due to altered mental status.Patient had a glucose of 688, bicarb 7.0, potassium 6.2 and a pH of 7.05. She was diagnosed with severe diabetic ketoacidosis. She was placed on insulin drip. Her anion gap closed finally. She is transferred to medical floor. Patient is also covered with cefepime and vancomycin for severe leukocytosis, procalcitonin level elevated at 1.67. Chest x-ray has no acute changes. Patient has no hypoxemia. UA normal.   9/23.Procalcitonin level increased to 2.37, clinically, patient has evidence of acute pharyngitis. Antibiotic switched to Zithromax and Rocephin.  9/24.  Condition improved.  Procalcitonin level dropped down to 1.18.  We will continue cefdinir and Zithromax for pharyngitis.  Patient had episode of small black stool, Protonix was started.  Hemoglobin was monitored.  9/25.  Additional bowel movement.  Hemoglobin has been stable.  No need for emergent EGD, I will start Protonix twice a day, patient will schedule follow-up with GI in 2 weeks.  #1. Uncontrolled type 2 diabetes with diabetic ketoacidosis. Condition has improved.  Patient was placed on insulin drip originally.  Change to Lantus 20 units, glucose seem to be okay with 20 units.  Patient has not been compliant with her insulin dose at home, at discharge, I will change from 30 units to 20 units and follow-up with PCP in the near future.   #2. Acute bacterial pharyngitis. Patient had a persistent elevation of  procalcitonin level. Chest x-ray does not show any pneumonia, UA was normal. No GI symptoms. No abdominal tenderness to indicate gastritis, appendicitis or cholecystitis. She has some sore throat, has enlarged and tender lymph nodes in the submandibular area. She will be treated with antibiotics including Zithromax and Rocephin.  Procalcitonin level dropped down after treatment, will continue Zithromax and cefdinir for a few days.  3. Hypokalemia and hypophosphatemia. Supplemented.  We will give another dose of potassium orally before discharge.  4. Essential hypertension. Restart home medicines.  5. Dementia. Retart home medicines.  6. Generalized weakness and frequent falls. We will set home physical therapy, home care and home care RN to follow-up.  7.  Hypokalemia and hypomagnesemia. We will supplement before discharge.   Consults: pulmonary/intensive care  Significant Diagnostic Studies:   Treatments: Insulin drip, Rocephin and Zithromax.  Discharge Exam: Blood pressure (!) 197/83, pulse 73, temperature 98.1 F (36.7 C), temperature source Oral, resp. rate 18, height 5\' 3"  (1.6 m), weight 66 kg, SpO2 99 %. General appearance: alert, cooperative and Oriented to time place and person Resp: clear to auscultation bilaterally Cardio: regular rate and rhythm, S1, S2 normal, no murmur, click, rub or gallop GI: soft, non-tender; bowel sounds normal; no masses,  no organomegaly Extremities: extremities normal, atraumatic, no cyanosis or edema  Disposition: Discharge disposition: 01-Home or Self Care       Discharge Instructions    Diet - low sodium heart healthy   Complete by: As directed    Increase activity slowly   Complete by: As directed    Increase activity slowly   Complete by: As directed      Allergies as of 04/29/2020   No  Known Allergies     Medication List    TAKE these medications   acetaminophen 500 MG tablet Commonly known as:  TYLENOL Take 2 tablets (1,000 mg total) by mouth every 6 (six) hours as needed for mild pain or headache.   aspirin 81 MG EC tablet Take 1 tablet (81 mg total) by mouth daily.   azithromycin 250 MG tablet Commonly known as: ZITHROMAX Take 2 tablets (500 mg total) by mouth daily for 2 days.   cefdinir 300 MG capsule Commonly known as: OMNICEF Take 1 capsule (300 mg total) by mouth 2 (two) times daily for 5 days.   Centrum Adults Tabs Take 1 tablet by mouth every morning.   citalopram 20 MG tablet Commonly known as: CELEXA Take 20 mg by mouth daily.   Combigan 0.2-0.5 % ophthalmic solution Generic drug: brimonidine-timolol Place 1 drop into both eyes daily.   donepezil 5 MG tablet Commonly known as: ARICEPT Take 5 mg by mouth at bedtime.   Ensure Max Protein Liqd Take 330 mLs (11 oz total) by mouth 2 (two) times daily between meals.   famotidine 20 MG tablet Commonly known as: PEPCID Take 20 mg by mouth daily.   HumaLOG 100 UNIT/ML injection Generic drug: insulin lispro Inject 4 Units into the skin 3 (three) times daily with meals. (plus sliding scale)   hydrALAZINE 50 MG tablet Commonly known as: APRESOLINE Take 50 mg by mouth 3 (three) times daily.   insulin glargine 100 UNIT/ML injection Commonly known as: LANTUS Inject 0.2 mLs (20 Units total) into the skin at bedtime. What changed: how much to take   isosorbide mononitrate 30 MG 24 hr tablet Commonly known as: IMDUR Take 30 mg by mouth daily.   loratadine 10 MG tablet Commonly known as: CLARITIN Take 10 mg by mouth daily.   losartan 50 MG tablet Commonly known as: COZAAR Take 50 mg by mouth at bedtime.   lovastatin 40 MG tablet Commonly known as: MEVACOR Take 40 mg by mouth daily after supper.   Magnesium Oxide 400 (240 Mg) MG Tabs Take 400 mg by mouth daily.   memantine 5 MG tablet Commonly known as: NAMENDA Take 5 mg by mouth 2 (two) times daily.   pantoprazole 40 MG tablet Commonly known  as: Protonix Take 1 tablet (40 mg total) by mouth 2 (two) times daily.   saccharomyces boulardii 250 MG capsule Commonly known as: FLORASTOR Take 250 mg by mouth 2 (two) times daily.   simethicone 80 MG chewable tablet Commonly known as: MYLICON Chew 1 tablet (80 mg total) by mouth 4 (four) times daily as needed for flatulence.       Follow-up Information    Lynnea Ferrier, MD. Go on 05/01/2020.   Specialty: Internal Medicine Why: 9am appointment Contact information: [address] [facility] [place] [address] [phone]        Raj Janus, MD.   Specialty: Endocrinology Contact information: [phone] [facility] [address] [facility] [place] [address] [phone]        [facility] Follow up.   Why: They will follow up with you for your home health needs. Contact information: [address] [place] [address] [phone]        Wyline Mood, MD. Call in 2 week(s).   Specialty: Gastroenterology Contact information: [address] [place] [address] [phone]  Signed: Marrion Coy 04/29/2020, 11:28 AM

## 2020-04-29 NOTE — Progress Notes (Signed)
PHARMACY CONSULT NOTE  Pharmacy Consult for Electrolyte Monitoring and Replacement   Recent Labs: Potassium (mmol/L)  Date Value  04/29/2020 3.3 (L)  04/09/2012 4.0   Magnesium (mg/dL)  Date Value  00/34/9179 1.6 (L)  04/05/2012 2.4   Calcium (mg/dL)  Date Value  15/12/6977 8.4 (L)   Calcium, Total (mg/dL)  Date Value  48/08/6551 8.8   Albumin (g/dL)  Date Value  74/82/7078 3.4 (L)  02/17/2012 3.2 (L)   Phosphorus (mg/dL)  Date Value  67/54/4920 2.5   Sodium (mmol/L)  Date Value  04/29/2020 140  04/09/2012 142     Assessment: Pharmacy consulted to replace electrolytes in this 79 year old female with a past medical history significant for dementia, stroke, hypertension, and diabetes who presents with DKA and sepsis. Patient was on an insulin drip but transitioned to subcutaneous insulin following closing of the anion gap. Her initial AKI has resolved  Goal of Therapy:  Electrolytes WNL   Plan:  Will give KCl 40 mEq x 2 and Mg 2 g IV x 1.   Recheck electrolytes with AM labs.    Paschal Dopp, PharmD, BCPS Clinical Pharmacist 04/29/2020 8:16 AM

## 2020-04-29 NOTE — Plan of Care (Signed)
Discharge teaching completed with patient who is in stable condition. 

## 2020-04-29 NOTE — Progress Notes (Signed)
Initial Nutrition Assessment  DOCUMENTATION CODES:   Not applicable  INTERVENTION:  Glucerna Shake po TID, each supplement provides 220 kcal and 10 grams of protein  MVI with minerals daily  Unable to provide interventions above at this time, orders have been reconciled for discharge   NUTRITION DIAGNOSIS:   Inadequate oral intake related to acute illness (sore throat secondary to acute bacterial pharyngitis) as evidenced by  (meal completion 10-50%).    GOAL:   Patient will meet greater than or equal to 90% of their needs    MONITOR:   Labs, I & O's, Supplement acceptance, PO intake, Weight trends  REASON FOR ASSESSMENT:   Consult Assessment of nutrition requirement/status  ASSESSMENT:  RD working remotely.   79 year old female with history significant for dementia, stroke, GERD, HTN, DM2, who was recently admitted to hospital and discharged on 9/18 for severe DKA and AKI presented due to AMS.  Pt admitted on 9/21 with DKA in the setting of medication noncompliance and acute pharyngitis. Plans for discharge on 9/24 cancelled due to loose black stool reported. Occult blood stool results currently pending.   Pt with poor meal intake, consumed 10-50% of meals yesterday and 40% of dinner tray on 9/23. Per notes, pt has been experiencing sore throat secondary to bacterial pharyngitis likely contributing to decreased meal intake. Will order Glucerna Shake to aid with meeting needs.   I/Os: +8530 ml since admit  Per chart, weights have been fairly stable over the past 6 months, suspect weights on 9/05 and 9/15 stated or possibly entered in error.  04/28/20 66 kg  04/19/20 59 kg  04/09/20 59 kg  01/20/20 66.9 kg  10/27/19 64.8 kg  10/14/19 63.8 kg  10/10/19 67.4 kg    Medications reviewed and include: SSI, Lantus 20 units daily,  Namenda, Protonix, Klor-con IVPB: Rocephin  Labs: CBGs 161,151,136,159 x 24 hrs, K 3.3 (L), Hgb 10.6 (L), HCT 31.4 (L) 9/15 A1c  13.0  NUTRITION - FOCUSED PHYSICAL EXAM: Unable to complete at this time, RD working remotely.  Diet Order:   Diet Order            Diet - low sodium heart healthy           Diet Carb Modified Fluid consistency: Thin; Room service appropriate? Yes  Diet effective now                 EDUCATION NEEDS:   No education needs have been identified at this time  Skin:  Skin Assessment: Reviewed RN Assessment  Last BM:  9/24-type 3  Height:   Ht Readings from Last 1 Encounters:  04/26/20 5\' 3"  (1.6 m)    Weight:   Wt Readings from Last 1 Encounters:  04/28/20 66 kg    Ideal Body Weight:  52.3 kg  BMI:  Body mass index is 25.77 kg/m.  Estimated Nutritional Needs:   Kcal:  1650-1850  Protein:  75-85  Fluid:  1.6 L/day   04/30/20, RD, LDN Clinical Nutrition After Hours/Weekend Pager # in Amion

## 2020-04-29 NOTE — Progress Notes (Signed)
Mobility Specialist - Progress Note   04/29/20 1058  Mobility  Activity Refused mobility  Mobility performed by Mobility specialist    Pt politely refused mobility session this AM d/t feeling "sleepy". Will re-attempt at a later date/time.    Vicki [name] Mobility Specialist  04/29/20, 10:58 AM

## 2020-04-29 NOTE — Plan of Care (Signed)
Continuing with plan of care. 

## 2020-05-01 LAB — CULTURE, BLOOD (ROUTINE X 2)
Culture: NO GROWTH
Culture: NO GROWTH
Special Requests: ADEQUATE

## 2020-05-03 ENCOUNTER — Inpatient Hospital Stay: Payer: Medicare Other

## 2020-05-03 ENCOUNTER — Inpatient Hospital Stay
Admission: EM | Admit: 2020-05-03 | Discharge: 2020-05-09 | DRG: 637 | Disposition: A | Discharge status: Skilled Nursing Facility | Payer: Medicare Other | Attending: Internal Medicine | Admitting: Internal Medicine

## 2020-05-03 DIAGNOSIS — G309 Alzheimer's disease, unspecified: Secondary | ICD-10-CM | POA: Diagnosis present

## 2020-05-03 DIAGNOSIS — Z79899 Other long term (current) drug therapy: Secondary | ICD-10-CM

## 2020-05-03 DIAGNOSIS — E876 Hypokalemia: Secondary | ICD-10-CM | POA: Diagnosis present

## 2020-05-03 DIAGNOSIS — Z9119 Patient's noncompliance with other medical treatment and regimen: Secondary | ICD-10-CM | POA: Diagnosis not present

## 2020-05-03 DIAGNOSIS — G928 Other toxic encephalopathy: Secondary | ICD-10-CM | POA: Diagnosis present

## 2020-05-03 DIAGNOSIS — Z7982 Long term (current) use of aspirin: Secondary | ICD-10-CM

## 2020-05-03 DIAGNOSIS — E1110 Type 2 diabetes mellitus with ketoacidosis without coma: Secondary | ICD-10-CM | POA: Diagnosis present

## 2020-05-03 DIAGNOSIS — Z23 Encounter for immunization: Secondary | ICD-10-CM | POA: Diagnosis present

## 2020-05-03 DIAGNOSIS — E871 Hypo-osmolality and hyponatremia: Secondary | ICD-10-CM | POA: Diagnosis present

## 2020-05-03 DIAGNOSIS — Z66 Do not resuscitate: Secondary | ICD-10-CM | POA: Diagnosis present

## 2020-05-03 DIAGNOSIS — I252 Old myocardial infarction: Secondary | ICD-10-CM | POA: Diagnosis not present

## 2020-05-03 DIAGNOSIS — E1311 Other specified diabetes mellitus with ketoacidosis with coma: Secondary | ICD-10-CM

## 2020-05-03 DIAGNOSIS — G9340 Encephalopathy, unspecified: Secondary | ICD-10-CM | POA: Diagnosis present

## 2020-05-03 DIAGNOSIS — Z7189 Other specified counseling: Secondary | ICD-10-CM | POA: Diagnosis not present

## 2020-05-03 DIAGNOSIS — R4182 Altered mental status, unspecified: Secondary | ICD-10-CM

## 2020-05-03 DIAGNOSIS — E785 Hyperlipidemia, unspecified: Secondary | ICD-10-CM | POA: Diagnosis present

## 2020-05-03 DIAGNOSIS — E11649 Type 2 diabetes mellitus with hypoglycemia without coma: Secondary | ICD-10-CM | POA: Diagnosis present

## 2020-05-03 DIAGNOSIS — Z794 Long term (current) use of insulin: Secondary | ICD-10-CM

## 2020-05-03 DIAGNOSIS — K219 Gastro-esophageal reflux disease without esophagitis: Secondary | ICD-10-CM | POA: Diagnosis present

## 2020-05-03 DIAGNOSIS — Z8673 Personal history of transient ischemic attack (TIA), and cerebral infarction without residual deficits: Secondary | ICD-10-CM | POA: Diagnosis not present

## 2020-05-03 DIAGNOSIS — Z20822 Contact with and (suspected) exposure to covid-19: Secondary | ICD-10-CM | POA: Diagnosis present

## 2020-05-03 DIAGNOSIS — Z8249 Family history of ischemic heart disease and other diseases of the circulatory system: Secondary | ICD-10-CM | POA: Diagnosis not present

## 2020-05-03 DIAGNOSIS — I2510 Atherosclerotic heart disease of native coronary artery without angina pectoris: Secondary | ICD-10-CM | POA: Diagnosis present

## 2020-05-03 DIAGNOSIS — I10 Essential (primary) hypertension: Secondary | ICD-10-CM | POA: Diagnosis present

## 2020-05-03 DIAGNOSIS — Z515 Encounter for palliative care: Secondary | ICD-10-CM

## 2020-05-03 DIAGNOSIS — N179 Acute kidney failure, unspecified: Secondary | ICD-10-CM | POA: Diagnosis present

## 2020-05-03 DIAGNOSIS — F0280 Dementia in other diseases classified elsewhere without behavioral disturbance: Secondary | ICD-10-CM | POA: Diagnosis present

## 2020-05-03 DIAGNOSIS — R29898 Other symptoms and signs involving the musculoskeletal system: Secondary | ICD-10-CM | POA: Diagnosis present

## 2020-05-03 DIAGNOSIS — Z833 Family history of diabetes mellitus: Secondary | ICD-10-CM | POA: Diagnosis not present

## 2020-05-03 DIAGNOSIS — R52 Pain, unspecified: Secondary | ICD-10-CM

## 2020-05-03 DIAGNOSIS — M722 Plantar fascial fibromatosis: Secondary | ICD-10-CM | POA: Diagnosis present

## 2020-05-03 DIAGNOSIS — R9389 Abnormal findings on diagnostic imaging of other specified body structures: Secondary | ICD-10-CM

## 2020-05-03 LAB — BASIC METABOLIC PANEL
Anion gap: 18 — ABNORMAL HIGH (ref 5–15)
BUN: 31 mg/dL — ABNORMAL HIGH (ref 8–23)
BUN: 21 mg/dL (ref 8–23)
CO2: <7 mmol/L — ABNORMAL LOW (ref 22–32)
CO2: 12 mmol/L — ABNORMAL LOW (ref 22–32)
Calcium: 8 mg/dL — ABNORMAL LOW (ref 8.9–10.3)
Calcium: 6.1 mg/dL — CL (ref 8.9–10.3)
Chloride: 97 mmol/L — ABNORMAL LOW (ref 98–111)
Chloride: 111 mmol/L (ref 98–111)
Creatinine, Ser: 2.02 mg/dL — ABNORMAL HIGH (ref 0.44–1.00)
Creatinine, Ser: 1.11 mg/dL — ABNORMAL HIGH (ref 0.44–1.00)
GFR calc Af Amer: 26 mL/min — ABNORMAL LOW (ref >60)
GFR calc Af Amer: 54 mL/min — ABNORMAL LOW (ref >60)
GFR calc non Af Amer: 23 mL/min — ABNORMAL LOW (ref >60)
GFR calc non Af Amer: 47 mL/min — ABNORMAL LOW (ref >60)
Glucose, Bld: 720 mg/dL (ref 70–99)
Glucose, Bld: 252 mg/dL — ABNORMAL HIGH (ref 70–99)
Potassium: 4.7 mmol/L (ref 3.5–5.1)
Potassium: 2.9 mmol/L — ABNORMAL LOW (ref 3.5–5.1)
Sodium: 137 mmol/L (ref 135–145)
Sodium: 141 mmol/L (ref 135–145)

## 2020-05-03 LAB — BLOOD GAS, ARTERIAL
Acid-base deficit: 28.2 mmol/L — ABNORMAL HIGH (ref 0.0–2.0)
Bicarbonate: 2.8 mmol/L — ABNORMAL LOW (ref 20.0–28.0)
FIO2: 1
O2 Saturation: 99.4 %
Patient temperature: 37
pCO2 arterial: <19 mmHg — CL (ref 32.0–48.0)
pH, Arterial: 6.91 — CL (ref 7.350–7.450)
pO2, Arterial: 230 mmHg — ABNORMAL HIGH (ref 83.0–108.0)

## 2020-05-03 LAB — COMPREHENSIVE METABOLIC PANEL
ALT: 55 U/L — ABNORMAL HIGH (ref 0–44)
AST: 42 U/L — ABNORMAL HIGH (ref 15–41)
Albumin: 3.3 g/dL — ABNORMAL LOW (ref 3.5–5.0)
Alkaline Phosphatase: 80 U/L (ref 38–126)
BUN: 30 mg/dL — ABNORMAL HIGH (ref 8–23)
CO2: <7 mmol/L — ABNORMAL LOW (ref 22–32)
Calcium: 8.8 mg/dL — ABNORMAL LOW (ref 8.9–10.3)
Chloride: 96 mmol/L — ABNORMAL LOW (ref 98–111)
Creatinine, Ser: 1.9 mg/dL — ABNORMAL HIGH (ref 0.44–1.00)
GFR calc Af Amer: 28 mL/min — ABNORMAL LOW (ref >60)
GFR calc non Af Amer: 24 mL/min — ABNORMAL LOW (ref >60)
Glucose, Bld: 841 mg/dL (ref 70–99)
Potassium: 6 mmol/L — ABNORMAL HIGH (ref 3.5–5.1)
Sodium: 134 mmol/L — ABNORMAL LOW (ref 135–145)
Total Bilirubin: 2 mg/dL — ABNORMAL HIGH (ref 0.3–1.2)
Total Protein: 6.4 g/dL — ABNORMAL LOW (ref 6.5–8.1)

## 2020-05-03 LAB — CBC
HCT: 36.8 % (ref 36.0–46.0)
Hemoglobin: 10.6 g/dL — ABNORMAL LOW (ref 12.0–15.0)
MCH: 31.3 pg (ref 26.0–34.0)
MCHC: 28.8 g/dL — ABNORMAL LOW (ref 30.0–36.0)
MCV: 108.6 fL — ABNORMAL HIGH (ref 80.0–100.0)
Platelets: 343 10*3/uL (ref 150–400)
RBC: 3.39 MIL/uL — ABNORMAL LOW (ref 3.87–5.11)
RDW: 15.5 % (ref 11.5–15.5)
WBC: 26.2 10*3/uL — ABNORMAL HIGH (ref 4.0–10.5)
nRBC: 0 % (ref 0.0–0.2)

## 2020-05-03 LAB — URINALYSIS, COMPLETE (UACMP) WITH MICROSCOPIC
Bilirubin Urine: NEGATIVE
Glucose, UA: >=500 mg/dL — AB
Ketones, ur: 80 mg/dL — AB
Leukocytes,Ua: NEGATIVE
Nitrite: NEGATIVE
Protein, ur: NEGATIVE mg/dL
Specific Gravity, Urine: 1.016 (ref 1.005–1.030)
pH: 5 (ref 5.0–8.0)

## 2020-05-03 LAB — PROCALCITONIN: Procalcitonin: 0.41 ng/mL

## 2020-05-03 LAB — GLUCOSE, CAPILLARY
Glucose-Capillary: 192 mg/dL — ABNORMAL HIGH (ref 70–99)
Glucose-Capillary: 329 mg/dL — ABNORMAL HIGH (ref 70–99)
Glucose-Capillary: 377 mg/dL — ABNORMAL HIGH (ref 70–99)
Glucose-Capillary: 435 mg/dL — ABNORMAL HIGH (ref 70–99)
Glucose-Capillary: 566 mg/dL (ref 70–99)
Glucose-Capillary: >600 mg/dL (ref 70–99)
Glucose-Capillary: >600 mg/dL (ref 70–99)
Glucose-Capillary: >600 mg/dL (ref 70–99)

## 2020-05-03 LAB — BLOOD GAS, VENOUS
Acid-base deficit: 29.6 mmol/L — ABNORMAL HIGH (ref 0.0–2.0)
Bicarbonate: 3.4 mmol/L — ABNORMAL LOW (ref 20.0–28.0)
O2 Saturation: 56.1 %
Patient temperature: 37
pCO2, Ven: 21 mmHg — ABNORMAL LOW (ref 44.0–60.0)
pH, Ven: 6.82 — CL (ref 7.250–7.430)
pO2, Ven: 57 mmHg — ABNORMAL HIGH (ref 32.0–45.0)

## 2020-05-03 LAB — LIPASE, BLOOD: Lipase: 27 U/L (ref 11–51)

## 2020-05-03 LAB — FIBRIN DERIVATIVES D-DIMER (ARMC ONLY): Fibrin derivatives D-dimer (ARMC): 3580.55 ng/mL (FEU) — ABNORMAL HIGH (ref 0.00–499.00)

## 2020-05-03 LAB — BETA-HYDROXYBUTYRIC ACID: Beta-Hydroxybutyric Acid: 4.88 mmol/L — ABNORMAL HIGH (ref 0.05–0.27)

## 2020-05-03 LAB — RESPIRATORY PANEL BY RT PCR (FLU A&B, COVID)
Influenza A by PCR: NEGATIVE
Influenza B by PCR: NEGATIVE
SARS Coronavirus 2 by RT PCR: NEGATIVE

## 2020-05-03 LAB — PHOSPHORUS: Phosphorus: 9.2 mg/dL — ABNORMAL HIGH (ref 2.5–4.6)

## 2020-05-03 LAB — CORTISOL: Cortisol, Plasma: 67.9 ug/dL

## 2020-05-03 LAB — PROTIME-INR
INR: 1.1 (ref 0.8–1.2)
Prothrombin Time: 13.7 seconds (ref 11.4–15.2)

## 2020-05-03 LAB — BRAIN NATRIURETIC PEPTIDE: B Natriuretic Peptide: 520.3 pg/mL — ABNORMAL HIGH (ref 0.0–100.0)

## 2020-05-03 LAB — MAGNESIUM: Magnesium: 2.3 mg/dL (ref 1.7–2.4)

## 2020-05-03 MED ORDER — CALCIUM GLUCONATE-NACL 1-0.675 GM/50ML-% IV SOLN
1.0000 g | Freq: Once | INTRAVENOUS | Status: AC
  Administered 2020-05-03: 1000 mg via INTRAVENOUS
  Filled 2020-05-03: qty 50

## 2020-05-03 MED ORDER — ENOXAPARIN SODIUM 40 MG/0.4ML ~~LOC~~ SOLN: 40.0000 mg | SUBCUTANEOUS | Status: DC

## 2020-05-03 MED ORDER — FAMOTIDINE IN NACL 20-0.9 MG/50ML-% IV SOLN
20.0000 mg | Freq: Two times a day (BID) | INTRAVENOUS | Status: DC
  Administered 2020-05-03: 20 mg via INTRAVENOUS
  Filled 2020-05-03: qty 50

## 2020-05-03 MED ORDER — SODIUM CHLORIDE 0.9 % IV BOLUS
1000.0000 mL | Freq: Once | INTRAVENOUS | Status: AC
  Administered 2020-05-03: 1000 mL via INTRAVENOUS

## 2020-05-03 MED ORDER — DEXTROSE 50 % IV SOLN
0.0000 mL | INTRAVENOUS | Status: DC | PRN
  Administered 2020-05-06 – 2020-05-07 (×4): 50 mL via INTRAVENOUS
  Filled 2020-05-03 (×4): qty 50

## 2020-05-03 MED ORDER — STERILE WATER FOR INJECTION IV SOLN
INTRAVENOUS | Status: DC
  Filled 2020-05-03 (×21): qty 850
  Filled 2020-05-03: qty 150
  Filled 2020-05-03: qty 850
  Filled 2020-05-03: qty 150
  Filled 2020-05-03: qty 850

## 2020-05-03 MED ORDER — DEXTROSE IN LACTATED RINGERS 5 % IV SOLN: INTRAVENOUS | Status: DC

## 2020-05-03 MED ORDER — LACTATED RINGERS IV SOLN: INTRAVENOUS | Status: DC

## 2020-05-03 MED ORDER — POTASSIUM CHLORIDE 10 MEQ/100ML IV SOLN
10.0000 meq | INTRAVENOUS | Status: AC
  Administered 2020-05-03 – 2020-05-04 (×6): 10 meq via INTRAVENOUS
  Filled 2020-05-03 (×6): qty 100

## 2020-05-03 MED ORDER — ASPIRIN 81 MG PO CHEW: 324.0000 mg | CHEWABLE_TABLET | ORAL | Status: AC

## 2020-05-03 MED ORDER — SODIUM CHLORIDE 0.9 % IV SOLN
500.0000 mg | INTRAVENOUS | Status: DC
  Administered 2020-05-03 – 2020-05-07 (×5): 500 mg via INTRAVENOUS
  Filled 2020-05-03 (×7): qty 500

## 2020-05-03 MED ORDER — INSULIN REGULAR(HUMAN) IN NACL 100-0.9 UT/100ML-% IV SOLN
INTRAVENOUS | Status: DC
  Administered 2020-05-03: 8.5 [IU]/h via INTRAVENOUS
  Administered 2020-05-04: 2 [IU]/h via INTRAVENOUS
  Administered 2020-05-04: 3.2 [IU]/h via INTRAVENOUS
  Filled 2020-05-03 (×2): qty 100

## 2020-05-03 MED ORDER — ASPIRIN 300 MG RE SUPP
300.0000 mg | RECTAL | Status: AC
  Administered 2020-05-03: 300 mg via RECTAL

## 2020-05-03 MED ORDER — FAMOTIDINE IN NACL 20-0.9 MG/50ML-% IV SOLN
20.0000 mg | INTRAVENOUS | Status: DC
  Administered 2020-05-05 – 2020-05-09 (×3): 20 mg via INTRAVENOUS
  Filled 2020-05-03 (×6): qty 50

## 2020-05-03 MED ORDER — POLYETHYLENE GLYCOL 3350 17 G PO PACK: 17.0000 g | PACK | Freq: Every day | ORAL | Status: DC | PRN

## 2020-05-03 MED ORDER — ALBUTEROL SULFATE (2.5 MG/3ML) 0.083% IN NEBU
2.5000 mg | INHALATION_SOLUTION | RESPIRATORY_TRACT | Status: DC
  Administered 2020-05-03 – 2020-05-05 (×10): 2.5 mg via RESPIRATORY_TRACT
  Filled 2020-05-03 (×8): qty 3

## 2020-05-03 MED ORDER — SODIUM BICARBONATE 8.4 % IV SOLN
INTRAVENOUS | Status: AC
  Administered 2020-05-03: 50 meq
  Filled 2020-05-03: qty 50

## 2020-05-03 MED ORDER — LACTATED RINGERS IV BOLUS
20.0000 mL/kg | Freq: Once | INTRAVENOUS | Status: AC
  Administered 2020-05-03: 1320 mL via INTRAVENOUS

## 2020-05-03 MED ORDER — ENOXAPARIN SODIUM 30 MG/0.3ML ~~LOC~~ SOLN
30.0000 mg | SUBCUTANEOUS | Status: DC
  Administered 2020-05-03 – 2020-05-04 (×2): 30 mg via SUBCUTANEOUS
  Filled 2020-05-03 (×3): qty 0.3

## 2020-05-03 MED ORDER — DOCUSATE SODIUM 100 MG PO CAPS: 100.0000 mg | ORAL_CAPSULE | Freq: Two times a day (BID) | ORAL | Status: DC | PRN

## 2020-05-03 NOTE — ED Notes (Signed)
This RN called lab to add blood specimens onto previous lab draw

## 2020-05-03 NOTE — ED Notes (Signed)
Pt place on 6L [place] at this time

## 2020-05-03 NOTE — ED Notes (Signed)
Cannot get an accurate oxygen reading on pt. Pulse ox tried on both ears, fingers and forehead. Pt placed on 4L [place] as precaution.

## 2020-05-03 NOTE — Progress Notes (Signed)
PHARMACIST - PHYSICIAN COMMUNICATION  CONCERNING:  Enoxaparin (Lovenox) for DVT Prophylaxis    RECOMMENDATION: Patient was prescribed enoxaprin 40mg  q24 hours for VTE prophylaxis.   Estimated Creatinine Clearance: 19.9 mL/min (A) (by C-G formula based on SCr of 2.02 mg/dL (H)).  Patient is candidate for enoxaparin 30mg  every 24 hours based on CrCl <13ml/min  DESCRIPTION: Pharmacy has adjusted enoxaparin dose per [facility] policy.  Patient is now receiving enoxaparin 30mg  every 24 hours   31m, PharmD, BCPS Clinical Pharmacist 05/03/2020 3:38 PM;

## 2020-05-03 NOTE — H&P (Signed)
CRITICAL CARE H&P    Name: Dystany Duffy MRN: [id number] DOB: 1941-02-25     LOS: 0   SUBJECTIVE FINDINGS & SIGNIFICANT EVENTS    Patient description:  79 yo F with hx of HTN, GERD, Hx of CAD w/MI, Hx of CVA, She was admitted to hospital 3 times for DKA in 2021 and once for hypoglycemia. She came in this time with lethargy and kussmaul respiration.  She was found to have blood glucose >800 and had pH <6.99.  She is being admitted to MICU from ER for severe DKA and lethargy.   Lines/tubes :   Microbiology/Sepsis markers: Results for orders placed or performed during the hospital encounter of 05/03/20  Respiratory Panel by RT PCR (Flu A&B, Covid) - Nasopharyngeal Swab     Status: None   Collection Time: 05/03/20 10:21 AM   Specimen: Nasopharyngeal Swab  Result Value Ref Range Status   SARS Coronavirus 2 by RT PCR NEGATIVE NEGATIVE Final    Comment: (NOTE) SARS-CoV-2 target nucleic acids are NOT DETECTED.  The SARS-CoV-2 RNA is generally detectable in upper respiratoy specimens during the acute phase of infection. The lowest concentration of SARS-CoV-2 viral copies this assay can detect is 131 copies/mL. A negative result does not preclude SARS-Cov-2 infection and should not be used as the sole basis for treatment or other patient management decisions. A negative result may occur with  improper specimen collection/handling, submission of specimen other than nasopharyngeal swab, presence of viral mutation(s) within the areas targeted by this assay, and inadequate number of viral copies (<131 copies/mL). A negative result must be combined with clinical observations, patient history, and epidemiological information. The expected result is Negative.  Fact Sheet for Patients:   https://www.moore.com/  Fact Sheet for Healthcare Providers:  https://www.young.biz/  This test is no t yet approved or cleared by the [place] FDA and  has been authorized for detection and/or diagnosis of SARS-CoV-2 by FDA under an Emergency Use Authorization (EUA). This EUA will remain  in effect (meaning this test can be used) for the duration of the COVID-19 declaration under Section 564(b)(1) of the Act, 21 U.S.C. section 360bbb-3(b)(1), unless the authorization is terminated or revoked sooner.     Influenza A by PCR NEGATIVE NEGATIVE Final   Influenza B by PCR NEGATIVE NEGATIVE Final    Comment: (NOTE) The Xpert Xpress SARS-CoV-2/FLU/RSV assay is intended as an aid in  the diagnosis of influenza from Nasopharyngeal swab specimens and  should not be used as a sole basis for treatment. Nasal washings and  aspirates are unacceptable for Xpert Xpress SARS-CoV-2/FLU/RSV  testing.  Fact Sheet for Patients: https://www.moore.com/  Fact Sheet for Healthcare Providers: https://www.young.biz/  This test is not yet approved or cleared by the [place] FDA and  has been authorized for detection and/or diagnosis of SARS-CoV-2 by  FDA under an Emergency Use Authorization (EUA). This EUA will remain  in effect (meaning this test can be used) for the duration of the  Covid-19 declaration under Section 564(b)(1) of the Act, 21  U.S.C. section 360bbb-3(b)(1), unless the authorization is  terminated or revoked. Performed at [facility], [address], [place], [place] [address]     Anti-infectives:  Anti-infectives (From admission, onward)   None       PAST MEDICAL HISTORY   Past Medical History:  Diagnosis Date  . Diabetes mellitus without complication (HCC)   . GERD (gastroesophageal reflux disease)   . Hypertension   . Lesion of  sciatic nerve, right side   . MI (myocardial  infarction) (HCC)   . Stroke [employer])      SURGICAL HISTORY   Past Surgical History:  Procedure Laterality Date  . ABDOMINAL HYSTERECTOMY    . BREAST BIOPSY Right    neg  . BREAST SURGERY    . LEFT HEART CATH AND CORONARY ANGIOGRAPHY N/A 10/01/2016   Procedure: Left Heart Cath and Coronary Angiography and possible PCI;  Surgeon: Alwyn Pea, MD;  Location: [facility] INVASIVE CV LAB;  Service: Cardiovascular;  Laterality: N/A;     FAMILY HISTORY   Family History  Problem Relation Age of Onset  . Heart disease Mother   . Diabetes Mellitus II Father   . Hypertension Sister   . Diabetes Mellitus II Sister   . Breast cancer Sister 32  . Hypertension Brother   . Diabetes Mellitus II Brother      SOCIAL HISTORY   Social History   Tobacco Use  . Smoking status: Never Smoker  . Smokeless tobacco: Never Used  Vaping Use  . Vaping Use: Never used  Substance Use Topics  . Alcohol use: No  . Drug use: No     MEDICATIONS   Current Medication:  Current Facility-Administered Medications:  .  albuterol (PROVENTIL) (2.5 MG/3ML) 0.083% nebulizer solution 2.5 mg, 2.5 mg, Nebulization, Q4H, Jules Baty, MD .  aspirin chewable tablet 324 mg, 324 mg, Oral, NOW **OR** aspirin suppository 300 mg, 300 mg, Rectal, NOW, Jameal Razzano, MD .  dextrose 5 % in lactated ringers infusion, , Intravenous, Continuous, Paduchowski, Kevin, MD .  dextrose 50 % solution 0-50 mL, 0-50 mL, Intravenous, PRN, Minna Antis, MD .  docusate sodium (COLACE) capsule 100 mg, 100 mg, Oral, BID PRN, Vida Rigger, MD .  enoxaparin (LOVENOX) injection 40 mg, 40 mg, Subcutaneous, Q24H, Averi Kilty, MD .  famotidine (PEPCID) IVPB 20 mg premix, 20 mg, Intravenous, Q12H, Avira Tillison, MD .  insulin regular, human (MYXREDLIN) 100 units/ 100 mL infusion, , Intravenous, Continuous, Paduchowski, Caryn Bee, MD, Last Rate: 8.5 mL/hr at 05/03/20 1158, 8.5 Units/hr at 05/03/20 1158 .  lactated ringers bolus  1,320 mL, 20 mL/kg, Intravenous, Once, Minna Antis, MD .  lactated ringers infusion, , Intravenous, Continuous, Paduchowski, Caryn Bee, MD .  polyethylene glycol (MIRALAX / GLYCOLAX) packet 17 g, 17 g, Oral, Daily PRN, Karna Christmas, Mashawn Brazil, MD .  sodium bicarbonate 150 mEq in sterile water 1,000 mL infusion, , Intravenous, Continuous, Paduchowski, Caryn Bee, MD, Last Rate: 150 mL/hr at 05/03/20 1145, New Bag at 05/03/20 1145  Current Outpatient Medications:  .  aspirin EC 81 MG EC tablet, Take 1 tablet (81 mg total) by mouth daily., Disp: 30 tablet, Rfl: 6 .  brimonidine-timolol (COMBIGAN) 0.2-0.5 % ophthalmic solution, Place 1 drop into both eyes daily., Disp: , Rfl:  .  cefdinir (OMNICEF) 300 MG capsule, Take 1 capsule (300 mg total) by mouth 2 (two) times daily for 5 days., Disp: 10 capsule, Rfl: 0 .  citalopram (CELEXA) 20 MG tablet, Take 20 mg by mouth daily., Disp: , Rfl:  .  donepezil (ARICEPT) 5 MG tablet, Take 5 mg by mouth at bedtime., Disp: , Rfl:  .  famotidine (PEPCID) 20 MG tablet, Take 20 mg by mouth daily., Disp: , Rfl:  .  hydrALAZINE (APRESOLINE) 50 MG tablet, Take 50 mg by mouth 3 (three) times daily., Disp: , Rfl:  .  insulin glargine (LANTUS) 100 UNIT/ML injection, Inject 0.2 mLs (20 Units total) into the skin at bedtime., Disp:  10 mL, Rfl: 11 .  insulin lispro (HUMALOG) 100 UNIT/ML injection, Inject 4 Units into the skin 3 (three) times daily with meals. (plus sliding scale), Disp: , Rfl:  .  isosorbide mononitrate (IMDUR) 30 MG 24 hr tablet, Take 30 mg by mouth daily., Disp: , Rfl:  .  loratadine (CLARITIN) 10 MG tablet, Take 10 mg by mouth daily., Disp: , Rfl:  .  losartan (COZAAR) 50 MG tablet, Take 50 mg by mouth at bedtime., Disp: , Rfl:  .  lovastatin (MEVACOR) 40 MG tablet, Take 40 mg by mouth daily after supper. , Disp: , Rfl:  .  Magnesium Oxide 400 (240 Mg) MG TABS, Take 400 mg by mouth daily. , Disp: , Rfl:  .  memantine (NAMENDA) 5 MG tablet, Take 5 mg by mouth 2 (two)  times daily. , Disp: , Rfl:  .  Multiple Vitamins-Minerals (CENTRUM ADULTS) TABS, Take 1 tablet by mouth every morning. , Disp: , Rfl:  .  pantoprazole (PROTONIX) 40 MG tablet, Take 1 tablet (40 mg total) by mouth 2 (two) times daily., Disp: 60 tablet, Rfl: 0 .  saccharomyces boulardii (FLORASTOR) 250 MG capsule, Take 250 mg by mouth 2 (two) times daily., Disp: , Rfl:  .  simethicone (MYLICON) 80 MG chewable tablet, Chew 1 tablet (80 mg total) by mouth 4 (four) times daily as needed for flatulence., Disp: 30 tablet, Rfl: 0 .  acetaminophen (TYLENOL) 500 MG tablet, Take 2 tablets (1,000 mg total) by mouth every 6 (six) hours as needed for mild pain or headache., Disp: 30 tablet, Rfl: 0 .  Ensure Max Protein (ENSURE MAX PROTEIN) LIQD, Take 330 mLs (11 oz total) by mouth 2 (two) times daily between meals., Disp: 10000 mL, Rfl: 0    ALLERGIES   Patient has no known allergies.    REVIEW OF SYSTEMS     Unable to obtain due to lethargy   PHYSICAL EXAMINATION   Vital Signs: Temp:  [95.1 F (35.1 C)] 95.1 F (35.1 C) (09/29 1021) Pulse Rate:  [44-75] 70 (09/29 1245) Resp:  [14-19] 17 (09/29 1245) BP: (91-124)/(27-80) 93/41 (09/29 1245) SpO2:  [69 %-99 %] 99 % (09/29 1245)  GENERAL:Age appropriate chronically ill appearing HEAD: Normocephalic, atraumatic.  EYES: Pupils equal, round, reactive to light.  No scleral icterus.  MOUTH: Moist mucosal membrane. NECK: Supple. No thyromegaly. No nodules. No JVD.  PULMONARY: mild crackles without wheezing  CARDIOVASCULAR: S1 and S2. Regular rate and rhythm. No murmurs, rubs, or gallops.  GASTROINTESTINAL: Soft, nontender, non-distended. No masses. Positive bowel sounds. No hepatosplenomegaly.  MUSCULOSKELETAL: No swelling, clubbing, or edema.  NEUROLOGIC:patient is moving all 4 extermities but is lethargic SKIN:intact,warm,dry   PERTINENT DATA     Infusions: . dextrose 5% lactated ringers    . famotidine (PEPCID) IV    . insulin 8.5  Units/hr (05/03/20 1158)  . lactated ringers    . lactated ringers    .  sodium bicarbonate (isotonic) infusion in sterile water 150 mL/hr at 05/03/20 1145   Scheduled Medications: . albuterol  2.5 mg Nebulization Q4H  . aspirin  324 mg Oral NOW   Or  . aspirin  300 mg Rectal NOW  . enoxaparin (LOVENOX) injection  40 mg Subcutaneous Q24H   PRN Medications: dextrose, docusate sodium, polyethylene glycol Hemodynamic parameters:   Intake/Output: No intake/output data recorded.  Ventilator  Settings:    LAB RESULTS:  Basic Metabolic Panel: Recent Labs  Lab 04/27/20 0450 04/27/20 0450 04/28/20 0346 04/28/20 0346 04/29/20  6160 05/03/20 1021  NA 139  --  140  --  140 134*  K 3.2*   < > 3.4*   < > 3.3* 6.0*  CL 101  --  105  --  104 96*  CO2 29  --  28  --  28 <7*  GLUCOSE 128*  --  191*  --  164* 841*  BUN 28*  --  18  --  9 30*  CREATININE 0.96  --  0.83  --  0.66 1.90*  CALCIUM 8.4*  --  7.7*  --  8.4* 8.8*  MG 1.8  --  2.0  --  1.6*  --   PHOS 2.0*  --  2.6  --  2.5  --    < > = values in this interval not displayed.   Liver Function Tests: Recent Labs  Lab 05/03/20 1021  AST 42*  ALT 55*  ALKPHOS 80  BILITOT 2.0*  PROT 6.4*  ALBUMIN 3.3*   No results for input(s): LIPASE, AMYLASE in the last 168 hours. No results for input(s): AMMONIA in the last 168 hours. CBC: Recent Labs  Lab 04/27/20 0450 04/28/20 0346 04/28/20 1224 04/29/20 0633 05/03/20 1021  WBC 14.9* 6.5 6.9 5.6 26.2*  NEUTROABS 10.2* 3.0  --  2.9  --   HGB 10.8* 9.4* 10.2* 10.6* 10.6*  HCT 32.6* 28.1* 31.2* 31.4* 36.8  MCV 93.9 94.6 94.5 92.1 108.6*  PLT 215 185 198 210 343   Cardiac Enzymes: No results for input(s): CKTOTAL, CKMB, CKMBINDEX, TROPONINI in the last 168 hours. BNP: Invalid input(s): POCBNP CBG: Recent Labs  Lab 04/29/20 1154 04/29/20 1622 05/03/20 1024 05/03/20 1153 05/03/20 1243  GLUCAP 144* 149* >600* >600* >600*       IMAGING RESULTS:  Imaging: No  results found. @PROBHOSP @ No results found.   ASSESSMENT AND PLAN    -Multidisciplinary rounds held today  Altered mental status with encephalopathy   - due to toxic metabolic encephalopathy in context of severe acidemia and DKA  - treat underlying DKA  - investigate for secondary causes of DKA - blood cultures, resp cultures, CXR, medication review for compliance   - Covid testing is negative on this admission    Diabetic ketoacidosis    - treat per DKA protocol phase 1-3    - empiric antibiotics - zithromax for CAP    Acute Renal Failure-most likely due to DKA -follow chem 7 -follow UO -continue Foley Catheter-assess need daily -d/c nonessential nephrotoxins    Hypnatremia   -pseudo due to elevated blood glucose    - will recheck post treatement of DKA   Severe metabolic derrangements   - due to DKA - will monitor closely as insulin will likely cause additional shift of electrolytes.   ID -continue IV abx as prescibed -follow up cultures  GI/Nutrition GI PROPHYLAXIS as indicated DIET-->TF's as tolerated Constipation protocol as indicated  ENDO - ICU hypoglycemic\Hyperglycemia protocol -check FSBS per protocol   ELECTROLYTES -follow labs as needed -replace as needed -pharmacy consultation   DVT/GI PRX ordered -SCDs  TRANSFUSIONS AS NEEDED MONITOR FSBS ASSESS the need for LABS as needed   Critical care provider statement:    Critical care time (minutes):  109   Critical care time was exclusive of:  Separately billable procedures and treating other patients   Critical care was necessary to treat or prevent imminent or life-threatening deterioration of the following conditions:  DKA, multiple comorbid conditions.    Critical care was time  spent personally by me on the following activities:  Development of treatment plan with patient or surrogate, discussions with consultants, evaluation of patient's response to treatment, examination of patient,  obtaining history from patient or surrogate, ordering and performing treatments and interventions, ordering and review of laboratory studies and re-evaluation of patient's condition.  I assumed direction of critical care for this patient from another provider in my specialty: no    This document was prepared using Dragon voice recognition software and may include unintentional dictation errors.    Vida Rigger, M.D.  Division of Pulmonary & Critical Care Medicine  [facility]

## 2020-05-03 NOTE — ED Triage Notes (Signed)
Pt to ED via [facility] from home. Per EMS pt with AMS. CBG reading high with EMS. Pt non-compliant diabetic. Pt seen here on the 21st for the same. EtCO2 8. VSS.   Upon arrival pt alert to voice but non-verbal. Pt with labored breathing.

## 2020-05-03 NOTE — ED Notes (Signed)
Critical Ca: 6.1. Informed admitting via secure chat.

## 2020-05-03 NOTE — TOC Initial Note (Addendum)
Transition of Care [facility]) - Initial/Assessment Note    Patient Details  Name: Vicki [name] MRN: [id number] Date of Birth: 1941-01-24  Transition of Care [facility]) CM/SW Contact:    Marina Goodell Phone Number: [phone] 05/03/2020, 4:53 PM  Clinical Narrative:                  Patient presents to [facility] ED due to high GBC and lethargy.  Patient poor historian, confused, unable to respond to questions..  Patient d/c from [facility] on 04/29/2020.  Patient has a history of non-compliance with treatment. Patient's Robet Leu (Sister) [phone], main contact.  Patient needs assistance with all ADLs.  Patient active with [facility] home health, RN and home health aid, has walker.  Expected Discharge Plan: Skilled Nursing Facility Barriers to Discharge: Continued Medical Work up   Patient Goals and CMS Choice Patient states their goals for this hospitalization and ongoing recovery are:: Patient poor hisrotian unable to wake up although she made noise when spoken to.      Expected Discharge Plan and Services Expected Discharge Plan: Skilled Nursing Facility In-house Referral: Clinical Social Work     Living arrangements for the past 2 months: Single Family Home                                      Prior Living Arrangements/Services Living arrangements for the past 2 months: Single Family Home Lives with:: Relatives Patient language and need for interpreter reviewed:: Yes Do you feel safe going back to the place where you live?: Yes      Need for Family Participation in Patient Care: Yes (Comment) Care giver support system in place?: Yes (comment)   Criminal Activity/Legal Involvement Pertinent to Current Situation/Hospitalization: No - Comment as needed  Activities of Daily Living      Permission Sought/Granted Permission sought to share information with : Facility Medical sales representative Permission granted to share information with : No  Share Information with  NAME: Robet Leu (Sister) [phone]           Emotional Assessment Appearance:: Appears older than stated age Attitude/Demeanor/Rapport: Unable to Assess Affect (typically observed): Unable to Assess   Alcohol / Substance Use: Not Applicable Psych Involvement: No (comment)  Admission diagnosis:  Encephalopathy [G93.40] Patient Active Problem List   Diagnosis Date Noted  . Encephalopathy 05/03/2020  . Hypernatremia 04/19/2020  . Hyperkalemia 04/19/2020  . Hyperlipidemia 04/19/2020  . Sepsis (HCC) 01/19/2020  . Alzheimer's dementia without behavioral disturbance (HCC)   . DKA (diabetic ketoacidoses) (HCC) 10/14/2019  . Diabetic ketoacidosis without coma associated with diabetes mellitus due to underlying condition (HCC)   . Dehydration 10/06/2019  . Generalized weakness 10/06/2019  . AKI (acute kidney injury) (HCC) 10/06/2019  . Hypertension associated with diabetes (HCC)   . Hypoglycemia 12/10/2017  . 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 NSTEMI (non-ST elevated myocardial infarction) (HCC) 10/01/2016  . 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 Diabetes (HCC) 09/29/2016  . GERD (gastroesophageal reflux disease) 09/29/2016  . Nausea and vomiting 03/24/2016  . Diffuse abdominal pain 03/24/2016  . Hypokalemia 03/24/2016  . Chest pain 03/24/2016  . Elevated troponin I level 09/26/2015  . 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HCC) 09/25/2015   PCP:  Lynnea Ferrier, MD Pharmacy:   [facility] - [place], [place] - [address] [place] [address] Phone: [phone] Fax: [phone]     Social Determinants of Health (SDOH) Interventions    Readmission Risk Interventions Readmission Risk Prevention Plan 04/28/2020 01/20/2020 10/15/2019  Transportation Screening Complete Complete Complete  PCP or Specialist Appt within 3-5 Days - Complete Complete  HRI or Home Care Consult - -  Complete  Social Work Consult for Recovery Care Planning/Counseling - Complete -  Palliative Care Screening - Not Applicable -  Medication Review Oceanographer) Complete Complete Complete  PCP or Specialist appointment within 3-5 days of discharge Complete - -  HRI or Home Care Consult Complete - -  SW Recovery Care/Counseling Consult Complete - -  Palliative Care Screening Not Applicable - -  Skilled Nursing Facility Not Applicable - -  Some recent data might be hidden

## 2020-05-03 NOTE — ED Notes (Signed)
Date and time results received: 05/03/20 12:31 PM  (use smartphrase ".now" to insert current time)  Test: pH, pCO2, pO2, Bicarb Critical Value: pH 6.91, pCO2 14, pO2 14, Bicarb 2.8  Name of Provider Notified: Dr. Lenard Lance  Orders Received? Or Actions Taken?: Amp of bicard given per verbal order

## 2020-05-03 NOTE — ED Provider Notes (Signed)
[facility] Emergency Department Provider Note  Time seen: 10:29 AM  I have reviewed the triage vital signs and the nursing notes.   HISTORY  Chief Complaint Altered Mental Status   HPI Vicki [name] is a 79 y.o. female with a past medical history of diabetes, gastric reflux, hypertension, prior MI and CVA presents to the emergency department for decreased responsiveness and elevated blood glucose.  According to EMS patient is coming from home the blood glucose reading of "high."  Patient is confused, will look at you briefly when you call her name but then closes her eyes, does not answer questions or follow commands.  Patient has Kussmaul respirations.   Past Medical History:  Diagnosis Date  . Diabetes mellitus without complication (HCC)   . GERD (gastroesophageal reflux disease)   . Hypertension   . Lesion of sciatic nerve, right side   . MI (myocardial infarction) (HCC)   . Stroke [facility])     Patient Active Problem List   Diagnosis Date Noted  . Hypernatremia 04/19/2020  . Hyperkalemia 04/19/2020  . Hyperlipidemia 04/19/2020  . Sepsis (HCC) 01/19/2020  . Alzheimer's dementia without behavioral disturbance (HCC)   . DKA (diabetic ketoacidoses) (HCC) 10/14/2019  . Diabetic ketoacidosis without coma associated with diabetes mellitus due to underlying condition (HCC)   . Dehydration 10/06/2019  . Generalized weakness 10/06/2019  . AKI (acute kidney injury) (HCC) 10/06/2019  . Hypertension associated with diabetes (HCC)   . Hypoglycemia 12/10/2017  . 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 NSTEMI (non-ST elevated myocardial infarction) (HCC) 10/01/2016  . 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 Diabetes (HCC) 09/29/2016  . GERD (gastroesophageal reflux disease) 09/29/2016  . Nausea and vomiting 03/24/2016  . Diffuse abdominal pain 03/24/2016  . Hypokalemia 03/24/2016   . Chest pain 03/24/2016  . Elevated troponin I level 09/26/2015  . 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HCC) 09/25/2015    Past Surgical History:  Procedure Laterality Date  . ABDOMINAL HYSTERECTOMY    . BREAST BIOPSY Right    neg  . BREAST SURGERY    . LEFT HEART CATH AND CORONARY ANGIOGRAPHY N/A 10/01/2016   Procedure: Left Heart Cath and Coronary Angiography and possible PCI;  Surgeon: Alwyn Pea, MD;  Location: [facility] INVASIVE CV LAB;  Service: Cardiovascular;  Laterality: N/A;    Prior to Admission medications   Medication Sig Start Date End Date Taking? Authorizing Provider  acetaminophen (TYLENOL) 500 MG tablet Take 2 tablets (1,000 mg total) by mouth every 6 (six) hours as needed for mild pain or headache. 04/22/20   Lewie Chamber, MD  aspirin EC 81 MG EC tablet Take 1 tablet (81 mg total) by mouth daily. 03/26/16   Katharina Caper, MD  brimonidine-timolol (COMBIGAN) 0.2-0.5 % ophthalmic solution Place 1 drop into both eyes daily.    [provider]  cefdinir (OMNICEF) 300 MG capsule Take 1 capsule (300 mg total) by mouth 2 (two) times daily for 5 days. 04/28/20 05/03/20  Marrion Coy, MD  citalopram (CELEXA) 20 MG tablet Take 20 mg by mouth daily.    [provider]  donepezil (ARICEPT) 5 MG tablet Take 5 mg by mouth at bedtime.    [provider]  Ensure Max Protein (ENSURE MAX PROTEIN) LIQD Take 330 mLs (11 oz total) by mouth 2 (two) times daily between meals. 10/18/19   Rolly Salter, MD  famotidine (PEPCID) 20 MG tablet Take 20 mg  by mouth daily. 03/06/20   [provider]  hydrALAZINE (APRESOLINE) 50 MG tablet Take 50 mg by mouth 3 (three) times daily.    [provider]  insulin glargine (LANTUS) 100 UNIT/ML injection Inject 0.2 mLs (20 Units total) into the skin at bedtime. 04/28/20   Marrion Coy, MD  insulin lispro (HUMALOG) 100 UNIT/ML injection Inject 4 Units into the skin 3 (three) times daily with meals. (plus sliding scale)    [provider]  isosorbide mononitrate (IMDUR) 30 MG 24 hr tablet Take 30 mg by mouth daily. 04/19/20   [provider]  loratadine (CLARITIN) 10 MG tablet Take 10 mg by mouth daily. 02/09/20   [provider]  losartan (COZAAR) 50 MG tablet Take 50 mg by mouth at bedtime.    [provider]  lovastatin (MEVACOR) 40 MG tablet Take 40 mg by mouth daily after supper.     [provider]  Magnesium Oxide 400 (240 Mg) MG TABS Take 400 mg by mouth daily.  01/10/20   [provider]  memantine (NAMENDA) 5 MG tablet Take 5 mg by mouth 2 (two) times daily.     [provider]  Multiple Vitamins-Minerals (CENTRUM ADULTS) TABS Take 1 tablet by mouth every morning.     [provider]  pantoprazole (PROTONIX) 40 MG tablet Take 1 tablet (40 mg total) by mouth 2 (two) times daily. 04/29/20 05/29/20  Marrion Coy, MD  saccharomyces boulardii (FLORASTOR) 250 MG capsule Take 250 mg by mouth 2 (two) times daily.    [provider]  simethicone (MYLICON) 80 MG chewable tablet Chew 1 tablet (80 mg total) by mouth 4 (four) times daily as needed for flatulence. 10/18/19   Rolly Salter, MD    No Known Allergies  Family History  Problem Relation Age of Onset  . Heart disease Mother   . Diabetes Mellitus II Father   . Hypertension Sister   . Diabetes Mellitus II Sister   . Breast cancer Sister 37  . Hypertension Brother   . Diabetes Mellitus II Brother     Social History Social History   Tobacco Use  . Smoking status: Never Smoker  . Smokeless tobacco: Never Used  Vaping Use  . Vaping Use: Never used  Substance Use Topics  . Alcohol use: No  . Drug use: No    Review of Systems Unable to complete an adequate/accurate review of systems secondary to altered mental status ____________________________________________   PHYSICAL EXAM:  Constitutional: Patient is somnolent does awaken briefly to voice then falls back asleep.  Unable  answer questions or follow commands. Eyes: Normal exam ENT      Head: Normocephalic and atraumatic.      Mouth/Throat: Dry mucous membranes Cardiovascular: Normal rate, regular rhythm.  Respiratory: Patient has deep respirations but no distress Gastrointestinal: Soft and nontender. No distention.   Musculoskeletal: Nontender with normal range of motion in all extremities.  Neurologic: Somnolent, does not follow commands. Skin:  Skin is warm, dry and intact.  Psychiatric: Somnolent, confused  ____________________________________________    EKG  EKG viewed and interpreted by myself shows a normal sinus rhythm at 76 bpm with a narrow QRS, normal axis, normal intervals, nonspecific ST changes.   ____________________________________________   INITIAL IMPRESSION / ASSESSMENT AND PLAN / ED COURSE  Pertinent labs & imaging results that were available during my care of the patient were reviewed by me and considered in my medical decision making (see chart for details).  Patient presents to the emergency department with an elevated blood glucose confusion and decreased responsiveness.  Differential would include hypoglycemia, dehydration, DKA, metabolic or electrolyte abnormality.  We will check labs including VBG, begin IV hydration.  Patient will require admission to the hospital service once her work-up is been completed.  Patient remains quite confused, VBG is resulted with a pH of 6.82, I have ordered a bicarbonate infusion in addition to IV fluids.  Patient's labs have resulted showing hyperkalemia with significant hyperglycemia, we will start on insulin infusion.  Patient has a core temperature of 95.1 we will place under a Bair hugger.  Patient remains critically ill, we will discuss with the intensivist for ICU admission.  Although confused patient is adequately protecting her airway.  Vicki [name] was evaluated in Emergency Department on 05/03/2020 for the symptoms described in  the history of present illness. She was evaluated in the context of the global COVID-19 pandemic, which necessitated consideration that the patient might be at risk for infection with the SARS-CoV-2 virus that causes COVID-19. Institutional protocols and algorithms that pertain to the evaluation of patients at risk for COVID-19 are in a state of rapid change based on information released by regulatory bodies including the CDC and federal and state organizations. These policies and algorithms were followed during the patient's care in the ED.  CRITICAL CARE Performed by: Minna Antis   Total critical care time: 60 minutes  Critical care time was exclusive of separately billable procedures and treating other patients.  Critical care was necessary to treat or prevent imminent or life-threatening deterioration.  Critical care was time spent personally by me on the following activities: development of treatment plan with patient and/or surrogate as well as nursing, discussions with consultants, evaluation of patient's response to treatment, examination of patient, obtaining history from patient or surrogate, ordering and performing treatments and interventions, ordering and review of laboratory studies, ordering and review of radiographic studies, pulse oximetry and re-evaluation of patient's condition.   ____________________________________________   FINAL CLINICAL IMPRESSION(S) / ED DIAGNOSES  Diabetic ketoacidosis   Minna Antis, MD 05/03/20 1140

## 2020-05-03 NOTE — ED Notes (Signed)
Date and time results received: 05/03/20 11:03 AM  (use smartphrase ".now" to insert current time)  Test: pH Critical Value: 6.82  Name of Provider Notified: Dr. Lenard Lance   Orders Received? Or Actions Taken?: Actions Taken: No new orders at this time

## 2020-05-03 NOTE — Consult Note (Signed)
PHARMACY CONSULT NOTE - FOLLOW UP  Pharmacy Consult for Electrolyte Monitoring and Replacement   Recent Labs: Potassium (mmol/L)  Date Value  05/03/2020 6.0 (H)  04/09/2012 4.0   Magnesium (mg/dL)  Date Value  93/23/5573 1.6 (L)  04/05/2012 2.4   Calcium (mg/dL)  Date Value  22/09/5425 8.8 (L)   Calcium, Total (mg/dL)  Date Value  01/26/7627 8.8   Albumin (g/dL)  Date Value  31/51/7616 3.3 (L)  02/17/2012 3.2 (L)   Phosphorus (mg/dL)  Date Value  07/37/1062 2.5   Sodium (mmol/L)  Date Value  05/03/2020 134 (L)  04/09/2012 142     Assessment: 79 y.o. female with a past medical history of diabetes, gastric reflux, hypertension, prior MI and CVA presents to the emergency department for decreased responsiveness and elevated blood glucose.  Patient on insulin and bicarb drips.  Pharmacy has been consulted to monitor and replenish electrolytes.  Goal of Therapy:  Electrolytes wnl's  Plan:  Potassium is 6.0 - no replenishment warranted at this time.   Will check BMP q4hours per protocol while on insulin drip.  Will check Mg/Phos with am labs.  Will monitor closely.  Albina Billet, PharmD, BCPS Clinical Pharmacist 05/03/2020 1:35 PM

## 2020-05-03 NOTE — ED Notes (Signed)
Date and time results received: 05/03/20 11:04 AM  (use smartphrase ".now" to insert current time)  Test: Glucose  Critical Value: 841  Name of Provider Notified: Dr. Lenard Lance   Orders Received? Or Actions Taken?: No new orders at this time

## 2020-05-03 NOTE — ED Notes (Signed)
Pt with notable bowel movement. This RN, Colin Mulders, RN and Florentina Addison, RN at bedside. Pt cleaned. Clean sheets, brief and chux in place. EKG monitoring cords covered in feces. Cords cleaned with bleach wipes and set out to dry. Pt not on cardiac monitor at this time.

## 2020-05-03 NOTE — Progress Notes (Signed)
Pt lethargic, however able to open eyes and nod head appropriately on command.  She is currently on 6L O2 via nasal canula with O2 sats @100 % with no respiratory distress and all other vss.  Discussed plan of care with RN.  Will continue to monitor and assess pt  , [facility]  Pulmonary/Critical Care Pager [phone] (please enter 7 digits) PCCM Consult Pager [phone] (please enter 7 digits)

## 2020-05-03 NOTE — ED Notes (Signed)
Oxygen sat obtained. Pt sats 68-80%. Pt placed on NRB. MD made aware. Respiratory called for ABG.

## 2020-05-03 NOTE — ED Notes (Signed)
Bear hugger placed on pt at this time

## 2020-05-03 NOTE — Consult Note (Signed)
PHARMACY CONSULT NOTE - FOLLOW UP  Pharmacy Consult for Electrolyte Monitoring and Replacement   Recent Labs: Potassium (mmol/L)  Date Value  05/03/2020 2.9 (L)  04/09/2012 4.0   Magnesium (mg/dL)  Date Value  16/05/9603 2.3  04/05/2012 2.4   Calcium (mg/dL)  Date Value  54/04/8118 6.1 (LL)   Calcium, Total (mg/dL)  Date Value  14/78/2956 8.8   Albumin (g/dL)  Date Value  21/30/8657 3.3 (L)  02/17/2012 3.2 (L)   Phosphorus (mg/dL)  Date Value  84/69/6295 9.2 (H)   Sodium (mmol/L)  Date Value  05/03/2020 141  04/09/2012 142     Assessment: 79 y.o. female with a past medical history of diabetes, gastric reflux, hypertension, prior MI and CVA presents to the emergency department for decreased responsiveness and elevated blood glucose.  Patient on insulin and bicarb drips.  Pharmacy has been consulted to monitor and replenish electrolytes.  Goal of Therapy:  K > 4 while on insulin drip All other electrolytes WNL  Plan:  --2100 BMP with K 2.9, provider has ordered IV KCl 10 mEq x 6; no further electrolyte replacement warranted at this time.  --BMP q4hours per protocol while on insulin drip.  Will check Mg/Phos with am labs.  Will monitor closely. Renal function + Co2 improving.  Tressie Ellis 05/03/2020 9:47 PM

## 2020-05-04 LAB — BASIC METABOLIC PANEL
Anion gap: 14 (ref 5–15)
BUN: 27 mg/dL — ABNORMAL HIGH (ref 8–23)
CO2: 29 mmol/L (ref 22–32)
Calcium: 7.9 mg/dL — ABNORMAL LOW (ref 8.9–10.3)
Chloride: 102 mmol/L (ref 98–111)
Creatinine, Ser: 1.39 mg/dL — ABNORMAL HIGH (ref 0.44–1.00)
GFR calc Af Amer: 41 mL/min — ABNORMAL LOW (ref >60)
GFR calc non Af Amer: 35 mL/min — ABNORMAL LOW (ref >60)
Glucose, Bld: 155 mg/dL — ABNORMAL HIGH (ref 70–99)
Potassium: 4.5 mmol/L (ref 3.5–5.1)
Sodium: 145 mmol/L (ref 135–145)

## 2020-05-04 LAB — CBC
HCT: 29.7 % — ABNORMAL LOW (ref 36.0–46.0)
Hemoglobin: 9.9 g/dL — ABNORMAL LOW (ref 12.0–15.0)
MCH: 31.4 pg (ref 26.0–34.0)
MCHC: 33.3 g/dL (ref 30.0–36.0)
MCV: 94.3 fL (ref 80.0–100.0)
Platelets: 288 10*3/uL (ref 150–400)
RBC: 3.15 MIL/uL — ABNORMAL LOW (ref 3.87–5.11)
RDW: 14.7 % (ref 11.5–15.5)
WBC: 24 10*3/uL — ABNORMAL HIGH (ref 4.0–10.5)
nRBC: 0 % (ref 0.0–0.2)

## 2020-05-04 LAB — URINE CULTURE: Culture: NO GROWTH

## 2020-05-04 LAB — MAGNESIUM: Magnesium: 1.7 mg/dL (ref 1.7–2.4)

## 2020-05-04 LAB — PHOSPHORUS: Phosphorus: 3.1 mg/dL (ref 2.5–4.6)

## 2020-05-04 LAB — BLOOD GAS, VENOUS
Acid-Base Excess: 6.8 mmol/L — ABNORMAL HIGH (ref 0.0–2.0)
Bicarbonate: 31.9 mmol/L — ABNORMAL HIGH (ref 20.0–28.0)
O2 Saturation: 93.7 %
Patient temperature: 37
pCO2, Ven: 47 mmHg (ref 44.0–60.0)
pH, Ven: 7.44 — ABNORMAL HIGH (ref 7.250–7.430)
pO2, Ven: 67 mmHg — ABNORMAL HIGH (ref 32.0–45.0)

## 2020-05-04 LAB — GLUCOSE, CAPILLARY
Glucose-Capillary: 108 mg/dL — ABNORMAL HIGH (ref 70–99)
Glucose-Capillary: 116 mg/dL — ABNORMAL HIGH (ref 70–99)
Glucose-Capillary: 116 mg/dL — ABNORMAL HIGH (ref 70–99)
Glucose-Capillary: 117 mg/dL — ABNORMAL HIGH (ref 70–99)
Glucose-Capillary: 137 mg/dL — ABNORMAL HIGH (ref 70–99)
Glucose-Capillary: 139 mg/dL — ABNORMAL HIGH (ref 70–99)
Glucose-Capillary: 141 mg/dL — ABNORMAL HIGH (ref 70–99)
Glucose-Capillary: 147 mg/dL — ABNORMAL HIGH (ref 70–99)
Glucose-Capillary: 150 mg/dL — ABNORMAL HIGH (ref 70–99)
Glucose-Capillary: 179 mg/dL — ABNORMAL HIGH (ref 70–99)
Glucose-Capillary: 211 mg/dL — ABNORMAL HIGH (ref 70–99)
Glucose-Capillary: 212 mg/dL — ABNORMAL HIGH (ref 70–99)

## 2020-05-04 LAB — POTASSIUM: Potassium: 3.5 mmol/L (ref 3.5–5.1)

## 2020-05-04 MED ORDER — INSULIN ASPART PROT & ASPART (70-30 MIX) 100 UNIT/ML ~~LOC~~ SUSP
5.0000 [IU] | Freq: Three times a day (TID) | SUBCUTANEOUS | Status: DC
  Administered 2020-05-04: 5 [IU] via SUBCUTANEOUS
  Filled 2020-05-04: qty 10

## 2020-05-04 MED ORDER — FENTANYL CITRATE (PF) 100 MCG/2ML IJ SOLN
12.5000 ug | Freq: Once | INTRAMUSCULAR | Status: AC
  Administered 2020-05-04: 12.5 ug via INTRAVENOUS
  Filled 2020-05-04: qty 2

## 2020-05-04 MED ORDER — MAGNESIUM SULFATE 2 GM/50ML IV SOLN
2.0000 g | Freq: Once | INTRAVENOUS | Status: AC
  Administered 2020-05-04: 2 g via INTRAVENOUS
  Filled 2020-05-04: qty 50

## 2020-05-04 MED ORDER — PIPERACILLIN-TAZOBACTAM 3.375 G IVPB
3.3750 g | Freq: Three times a day (TID) | INTRAVENOUS | Status: DC
  Administered 2020-05-04 – 2020-05-08 (×12): 3.375 g via INTRAVENOUS
  Filled 2020-05-04 (×12): qty 50

## 2020-05-04 MED ORDER — INSULIN ASPART 100 UNIT/ML ~~LOC~~ SOLN
0.0000 [IU] | SUBCUTANEOUS | Status: DC
  Administered 2020-05-04: 1 [IU] via SUBCUTANEOUS
  Administered 2020-05-04: 3 [IU] via SUBCUTANEOUS
  Administered 2020-05-05: 1 [IU] via SUBCUTANEOUS
  Administered 2020-05-05: 2 [IU] via SUBCUTANEOUS
  Administered 2020-05-05: 1 [IU] via SUBCUTANEOUS
  Administered 2020-05-05: 2 [IU] via SUBCUTANEOUS
  Administered 2020-05-05 – 2020-05-07 (×3): 1 [IU] via SUBCUTANEOUS
  Administered 2020-05-07: 3 [IU] via SUBCUTANEOUS
  Administered 2020-05-07 – 2020-05-08 (×3): 2 [IU] via SUBCUTANEOUS
  Administered 2020-05-08: 1 [IU] via SUBCUTANEOUS
  Administered 2020-05-08: 3 [IU] via SUBCUTANEOUS
  Administered 2020-05-08 (×2): 5 [IU] via SUBCUTANEOUS
  Administered 2020-05-09: 3 [IU] via SUBCUTANEOUS
  Administered 2020-05-09: 5 [IU] via SUBCUTANEOUS
  Administered 2020-05-09: 3 [IU] via SUBCUTANEOUS
  Administered 2020-05-09: 2 [IU] via SUBCUTANEOUS
  Filled 2020-05-04 (×21): qty 1

## 2020-05-04 MED ORDER — INSULIN ASPART 100 UNIT/ML ~~LOC~~ SOLN
5.0000 [IU] | Freq: Three times a day (TID) | SUBCUTANEOUS | Status: DC
  Administered 2020-05-04 – 2020-05-06 (×5): 5 [IU] via SUBCUTANEOUS
  Filled 2020-05-04 (×5): qty 1

## 2020-05-04 MED ORDER — INSULIN GLARGINE 100 UNIT/ML ~~LOC~~ SOLN
20.0000 [IU] | Freq: Every day | SUBCUTANEOUS | Status: DC
  Administered 2020-05-04 – 2020-05-06 (×3): 20 [IU] via SUBCUTANEOUS
  Filled 2020-05-04 (×5): qty 0.2

## 2020-05-04 MED ORDER — SODIUM CHLORIDE 0.9 % IV SOLN
INTRAVENOUS | Status: DC | PRN
  Administered 2020-05-04: 500 mL via INTRAVENOUS

## 2020-05-04 MED ORDER — POTASSIUM CHLORIDE 10 MEQ/100ML IV SOLN
10.0000 meq | INTRAVENOUS | Status: DC
  Administered 2020-05-04: 10 meq via INTRAVENOUS
  Filled 2020-05-04: qty 100

## 2020-05-04 MED ORDER — HYDRALAZINE HCL 20 MG/ML IJ SOLN
10.0000 mg | INTRAMUSCULAR | Status: DC | PRN
  Administered 2020-05-07: 10 mg via INTRAVENOUS
  Filled 2020-05-04: qty 1

## 2020-05-04 NOTE — ED Notes (Signed)
Pt moaning and not answering this RN when asked about pain, pt guarding abdomen. Bladder scan revealed in bladder. Admitting notified via secure chat.

## 2020-05-04 NOTE — ED Notes (Signed)
Patient crying and refusing to finish IV potassium at this time. 72ml completed.

## 2020-05-04 NOTE — ED Notes (Signed)
Pt in and out cathed by this RN and Facilities manager. Pt's peri area cleaned before and after procedure. sterility maintained during procedure.  680 mL yellow, clear urine obtained. Rectal temp checked: 98.1.  Pt appears to be more relaxed and is no longer guarding abdomen. Warm blankets given.

## 2020-05-04 NOTE — Consult Note (Signed)
PHARMACY CONSULT NOTE - FOLLOW UP  Pharmacy Consult for Electrolyte Monitoring and Replacement   Recent Labs: Potassium (mmol/L)  Date Value  05/04/2020 4.5  04/09/2012 4.0   Magnesium (mg/dL)  Date Value  80/10/4915 1.7  04/05/2012 2.4   Calcium (mg/dL)  Date Value  91/50/5697 7.9 (L)   Calcium, Total (mg/dL)  Date Value  94/80/1655 8.8   Albumin (g/dL)  Date Value  37/48/2707 3.3 (L)  02/17/2012 3.2 (L)   Phosphorus (mg/dL)  Date Value  86/75/4492 3.1   Sodium (mmol/L)  Date Value  05/04/2020 145  04/09/2012 142   Assessment: 79 y.o. female with a past medical history of diabetes, gastric reflux, hypertension, prior MI and CVA presents to the emergency department for decreased responsiveness and elevated blood glucose.  Patient on insulin and bicarb drips.  Pharmacy has been consulted to monitor and replenish electrolytes.  Goal of Therapy:  K > 4 while on insulin drip All other electrolytes WNL  Plan:  --0334 BMP with K 4.5, Mg 1.7, Phos 3.1no further electrolyte replacement warranted at this time.  --Patient is now off the insulin drip (stopped at 1019 this morning). Will recheck potassium at 1300 to ensure potassium is WNL. Will recheck BMP with AM labs tomorrow.    Will monitor closely. Renal function + Co2 improving.  Katha Cabal 05/04/2020 11:36 AM

## 2020-05-04 NOTE — Progress Notes (Addendum)
CRITICAL CARE H&P    Name: Vicki [name] MRN: [id number] DOB: 1940-12-17     LOS: 1   SUBJECTIVE FINDINGS & SIGNIFICANT EVENTS    Patient description:  79 yo F with hx of HTN, GERD, Hx of CAD w/MI, Hx of CVA, She was admitted to hospital 3 times for DKA in 2021 and once for hypoglycemia. She came in this time with lethargy and kussmaul respiration.  She was found to have blood glucose >800 and had pH <6.99.  She is being admitted to MICU from ER for severe DKA and lethargy.    05/04/20- patient is able to open eyes and answer slowly, she appears clinically improved.  Wbc count is elevated but trending down. No source of infection at this time except abnromal CXR with possible pneumonia.     Lines/tubes :   Microbiology/Sepsis markers: Results for orders placed or performed during the hospital encounter of 05/03/20  Respiratory Panel by RT PCR (Flu A&B, Covid) - Nasopharyngeal Swab     Status: None   Collection Time: 05/03/20 10:21 AM   Specimen: Nasopharyngeal Swab  Result Value Ref Range Status   SARS Coronavirus 2 by RT PCR NEGATIVE NEGATIVE Final    Comment: (NOTE) SARS-CoV-2 target nucleic acids are NOT DETECTED.  The SARS-CoV-2 RNA is generally detectable in upper respiratoy specimens during the acute phase of infection. The lowest concentration of SARS-CoV-2 viral copies this assay can detect is 131 copies/mL. A negative result does not preclude SARS-Cov-2 infection and should not be used as the sole basis for treatment or other patient management decisions. A negative result may occur with  improper specimen collection/handling, submission of specimen other than nasopharyngeal swab, presence of viral mutation(s) within the areas targeted by this assay, and inadequate number of viral  copies (<131 copies/mL). A negative result must be combined with clinical observations, patient history, and epidemiological information. The expected result is Negative.  Fact Sheet for Patients:  https://www.moore.com/  Fact Sheet for Healthcare Providers:  https://www.young.biz/  This test is no t yet approved or cleared by the [place] FDA and  has been authorized for detection and/or diagnosis of SARS-CoV-2 by FDA under an Emergency Use Authorization (EUA). This EUA will remain  in effect (meaning this test can be used) for the duration of the COVID-19 declaration under Section 564(b)(1) of the Act, 21 U.S.C. section 360bbb-3(b)(1), unless the authorization is terminated or revoked sooner.     Influenza A by PCR NEGATIVE NEGATIVE Final   Influenza B by PCR NEGATIVE NEGATIVE Final    Comment: (NOTE) The Xpert Xpress SARS-CoV-2/FLU/RSV assay is intended as an aid in  the diagnosis of influenza from Nasopharyngeal swab specimens and  should not be used as a sole basis for treatment. Nasal washings and  aspirates are unacceptable for Xpert Xpress SARS-CoV-2/FLU/RSV  testing.  Fact Sheet for Patients: https://www.moore.com/  Fact Sheet for Healthcare Providers: https://www.young.biz/  This test is not yet approved or cleared by the [place] FDA and  has been authorized for detection and/or diagnosis of SARS-CoV-2 by  FDA under an Emergency Use Authorization (EUA). This EUA will remain  in effect (meaning this test can be used) for the duration of the  Covid-19 declaration under Section 564(b)(1) of the Act, 21  U.S.C. section 360bbb-3(b)(1), unless the authorization is  terminated or revoked. Performed at [facility], [address] [place], [place] [address]   Culture, blood (routine x 2)     Status: None (  Preliminary result)   Collection Time: 05/03/20  2:13 PM    Specimen: BLOOD  Result Value Ref Range Status   Specimen Description BLOOD LEFT ANTECUBITAL  Final   Special Requests   Final    BOTTLES DRAWN AEROBIC AND ANAEROBIC Blood Culture adequate volume   Culture   Final    NO GROWTH < 24 HOURS Performed at [facility], [address] [place], [place] [address]    Report Status PENDING  Incomplete  Culture, blood (routine x 2)     Status: None (Preliminary result)   Collection Time: 05/03/20  2:13 PM   Specimen: BLOOD  Result Value Ref Range Status   Specimen Description BLOOD BLOOD LEFT HAND  Final   Special Requests   Final    BOTTLES DRAWN AEROBIC AND ANAEROBIC Blood Culture results may not be optimal due to an inadequate volume of blood received in culture bottles   Culture   Final    NO GROWTH < 24 HOURS Performed at [facility], [address], [place], [place] [address]    Report Status PENDING  Incomplete    Anti-infectives:  Anti-infectives (From admission, onward)   Start     Dose/Rate Route Frequency Ordered Stop   05/03/20 1400  azithromycin (ZITHROMAX) 500 mg in sodium chloride 0.9 % 250 mL IVPB        500 mg 250 mL/hr over 60 Minutes Intravenous Every 24 hours 05/03/20 1324         PAST MEDICAL HISTORY   Past Medical History:  Diagnosis Date  . Diabetes mellitus without complication (HCC)   . GERD (gastroesophageal reflux disease)   . Hypertension   . Lesion of sciatic nerve, right side   . MI (myocardial infarction) (HCC)   . Stroke [facility])      SURGICAL HISTORY   Past Surgical History:  Procedure Laterality Date  . ABDOMINAL HYSTERECTOMY    . BREAST BIOPSY Right    neg  . BREAST SURGERY    . LEFT HEART CATH AND CORONARY ANGIOGRAPHY N/A 10/01/2016   Procedure: Left Heart Cath and Coronary Angiography and possible PCI;  Surgeon: Alwyn Pea, MD;  Location: [facility] INVASIVE CV LAB;  Service: Cardiovascular;  Laterality: N/A;     FAMILY HISTORY   Family History  Problem  Relation Age of Onset  . Heart disease Mother   . Diabetes Mellitus II Father   . Hypertension Sister   . Diabetes Mellitus II Sister   . Breast cancer Sister 54  . Hypertension Brother   . Diabetes Mellitus II Brother      SOCIAL HISTORY   Social History   Tobacco Use  . Smoking status: Never Smoker  . Smokeless tobacco: Never Used  Vaping Use  . Vaping Use: Never used  Substance Use Topics  . Alcohol use: No  . Drug use: No     MEDICATIONS   Current Medication:  Current Facility-Administered Medications:  .  albuterol (PROVENTIL) (2.5 MG/3ML) 0.083% nebulizer solution 2.5 mg, 2.5 mg, Nebulization, Q4H, Jarely Juncaj, MD, 2.5 mg at 05/04/20 0814 .  azithromycin (ZITHROMAX) 500 mg in sodium chloride 0.9 % 250 mL IVPB, 500 mg, Intravenous, Q24H, Vida Rigger, MD, Stopped at 05/03/20 1526 .  dextrose 5 % in lactated ringers infusion, , Intravenous, Continuous, Minna Antis, MD, Stopped at 05/04/20 1100 .  dextrose 50 % solution 0-50 mL, 0-50 mL, Intravenous, PRN, Minna Antis, MD .  docusate sodium (COLACE) capsule 100 mg, 100 mg,  Oral, BID PRN, Vida Rigger, MD .  enoxaparin (LOVENOX) injection 30 mg, 30 mg, Subcutaneous, Q24H, Shanlever, Charmayne Sheer, RPH, 30 mg at 05/03/20 2230 .  [START ON 05/05/2020] famotidine (PEPCID) IVPB 20 mg premix, 20 mg, Intravenous, Q48H, Chappell, Alex B, RPH .  hydrALAZINE (APRESOLINE) injection 10-20 mg, 10-20 mg, Intravenous, Q4H PRN, Blakeney, Dana G, NP .  insulin aspart (novoLOG) injection 0-9 Units, 0-9 Units, Subcutaneous, Q4H, Blakeney, Dana G, NP .  insulin aspart protamine- aspart (NOVOLOG MIX 70/30) injection 5 Units, 5 Units, Subcutaneous, TID PC, Brieonna Crutcher, MD .  insulin glargine (LANTUS) injection 20 Units, 20 Units, Subcutaneous, Daily, Eugenie Norrie, NP, 20 Units at 05/04/20 [phone]  polyethylene glycol (MIRALAX / GLYCOLAX) packet 17 g, 17 g, Oral, Daily PRN, Karna Christmas, Rosalea Withrow, MD .  sodium bicarbonate 150  mEq in sterile water 1,000 mL infusion, , Intravenous, Continuous, Minna Antis, MD, Stopped at 05/04/20 1010  Current Outpatient Medications:  .  aspirin EC 81 MG EC tablet, Take 1 tablet (81 mg total) by mouth daily., Disp: 30 tablet, Rfl: 6 .  brimonidine-timolol (COMBIGAN) 0.2-0.5 % ophthalmic solution, Place 1 drop into both eyes daily., Disp: , Rfl:  .  citalopram (CELEXA) 20 MG tablet, Take 20 mg by mouth daily., Disp: , Rfl:  .  donepezil (ARICEPT) 5 MG tablet, Take 5 mg by mouth at bedtime., Disp: , Rfl:  .  famotidine (PEPCID) 20 MG tablet, Take 20 mg by mouth daily., Disp: , Rfl:  .  hydrALAZINE (APRESOLINE) 50 MG tablet, Take 50 mg by mouth 3 (three) times daily., Disp: , Rfl:  .  insulin glargine (LANTUS) 100 UNIT/ML injection, Inject 0.2 mLs (20 Units total) into the skin at bedtime., Disp: 10 mL, Rfl: 11 .  insulin lispro (HUMALOG) 100 UNIT/ML injection, Inject 4 Units into the skin 3 (three) times daily with meals. (plus sliding scale), Disp: , Rfl:  .  isosorbide mononitrate (IMDUR) 30 MG 24 hr tablet, Take 30 mg by mouth daily., Disp: , Rfl:  .  loratadine (CLARITIN) 10 MG tablet, Take 10 mg by mouth daily., Disp: , Rfl:  .  losartan (COZAAR) 50 MG tablet, Take 50 mg by mouth at bedtime., Disp: , Rfl:  .  lovastatin (MEVACOR) 40 MG tablet, Take 40 mg by mouth daily after supper. , Disp: , Rfl:  .  Magnesium Oxide 400 (240 Mg) MG TABS, Take 400 mg by mouth daily. , Disp: , Rfl:  .  memantine (NAMENDA) 5 MG tablet, Take 5 mg by mouth 2 (two) times daily. , Disp: , Rfl:  .  Multiple Vitamins-Minerals (CENTRUM ADULTS) TABS, Take 1 tablet by mouth every morning. , Disp: , Rfl:  .  pantoprazole (PROTONIX) 40 MG tablet, Take 1 tablet (40 mg total) by mouth 2 (two) times daily., Disp: 60 tablet, Rfl: 0 .  saccharomyces boulardii (FLORASTOR) 250 MG capsule, Take 250 mg by mouth 2 (two) times daily., Disp: , Rfl:  .  simethicone (MYLICON) 80 MG chewable tablet, Chew 1 tablet (80 mg  total) by mouth 4 (four) times daily as needed for flatulence., Disp: 30 tablet, Rfl: 0 .  acetaminophen (TYLENOL) 500 MG tablet, Take 2 tablets (1,000 mg total) by mouth every 6 (six) hours as needed for mild pain or headache., Disp: 30 tablet, Rfl: 0 .  Ensure Max Protein (ENSURE MAX PROTEIN) LIQD, Take 330 mLs (11 oz total) by mouth 2 (two) times daily between meals., Disp: 10000 mL, Rfl: 0  ALLERGIES   Patient has no known allergies.    REVIEW OF SYSTEMS     Unable to obtain due to lethargy   PHYSICAL EXAMINATION   Vital Signs: Temp:  [98.1 F (36.7 C)-99.8 F (37.7 C)] 98.1 F (36.7 C) (09/30 0130) Pulse Rate:  [70-100] 78 (09/30 1100) Resp:  [16-30] 18 (09/30 1100) BP: (92-195)/(39-142) 155/58 (09/30 1100) SpO2:  [99 %-100 %] 100 % (09/30 1100)  GENERAL:Age appropriate chronically ill appearing HEAD: Normocephalic, atraumatic.  EYES: Pupils equal, round, reactive to light.  No scleral icterus.  MOUTH: Moist mucosal membrane. NECK: Supple. No thyromegaly. No nodules. No JVD.  PULMONARY: mild crackles without wheezing  CARDIOVASCULAR: S1 and S2. Regular rate and rhythm. No murmurs, rubs, or gallops.  GASTROINTESTINAL: Soft, nontender, non-distended. No masses. Positive bowel sounds. No hepatosplenomegaly.  MUSCULOSKELETAL: No swelling, clubbing, or edema.  NEUROLOGIC:patient is moving all 4 extermities but is lethargic SKIN:intact,warm,dry   PERTINENT DATA     Infusions: . azithromycin Stopped (05/03/20 1526)  . dextrose 5% lactated ringers Stopped (05/04/20 1100)  . [START ON 05/05/2020] famotidine (PEPCID) IV    .  sodium bicarbonate (isotonic) infusion in sterile water Stopped (05/04/20 1010)   Scheduled Medications: . albuterol  2.5 mg Nebulization Q4H  . enoxaparin (LOVENOX) injection  30 mg Subcutaneous Q24H  . insulin aspart  0-9 Units Subcutaneous Q4H  . insulin aspart protamine- aspart  5 Units Subcutaneous TID PC  . insulin glargine  20 Units  Subcutaneous Daily   PRN Medications: dextrose, docusate sodium, hydrALAZINE, polyethylene glycol Hemodynamic parameters:   Intake/Output: 09/29 0701 - 09/30 0700 In: 150 [IV Piggyback:150] Out: -   Ventilator  Settings:    LAB RESULTS:  Basic Metabolic Panel: Recent Labs  Lab 04/28/20 0346 04/28/20 0346 04/29/20 7902 04/29/20 0633 05/03/20 1021 05/03/20 1021 05/03/20 1413 05/03/20 1413 05/03/20 2048 05/04/20 0334  NA 140   < > 140  --  134*  --  137  --  141 145  K 3.4*   < > 3.3*   < > 6.0*   < > 4.7   < > 2.9* 4.5  CL 105   < > 104  --  96*  --  97*  --  111 102  CO2 28   < > 28  --  <7*  --  <7*  --  12* 29  GLUCOSE 191*   < > 164*  --  841*  --  720*  --  252* 155*  BUN 18   < > 9  --  30*  --  31*  --  21 27*  CREATININE 0.83   < > 0.66  --  1.90*  --  2.02*  --  1.11* 1.39*  CALCIUM 7.7*   < > 8.4*  --  8.8*  --  8.0*  --  6.1* 7.9*  MG 2.0  --  1.6*  --  2.3  --   --   --   --  1.7  PHOS 2.6  --  2.5  --  9.2*  --   --   --   --  3.1   < > = values in this interval not displayed.   Liver Function Tests: Recent Labs  Lab 05/03/20 1021  AST 42*  ALT 55*  ALKPHOS 80  BILITOT 2.0*  PROT 6.4*  ALBUMIN 3.3*   Recent Labs  Lab 05/03/20 1021  LIPASE 27   No results for input(s): AMMONIA in the last  168 hours. CBC: Recent Labs  Lab 04/28/20 0346 04/28/20 1224 04/29/20 0633 05/03/20 1021 05/04/20 0334  WBC 6.5 6.9 5.6 26.2* 24.0*  NEUTROABS 3.0  --  2.9  --   --   HGB 9.4* 10.2* 10.6* 10.6* 9.9*  HCT 28.1* 31.2* 31.4* 36.8 29.7*  MCV 94.6 94.5 92.1 108.6* 94.3  PLT 185 198 210 343 288   Cardiac Enzymes: No results for input(s): CKTOTAL, CKMB, CKMBINDEX, TROPONINI in the last 168 hours. BNP: Invalid input(s): POCBNP CBG: Recent Labs  Lab 05/04/20 0655 05/04/20 0809 05/04/20 0917 05/04/20 1012 05/04/20 1158  GLUCAP 150* 139* 117* 116* 147*       IMAGING RESULTS:  Imaging: DG Chest Port 1 View  Result Date: 05/03/2020 CLINICAL  DATA:  Abnormal chest x-ray EXAM: PORTABLE CHEST 1 VIEW COMPARISON:  04/25/2020 FINDINGS: Peribronchial thickening and interstitial prominence. Heart is upper limits normal in size. Left base opacity, likely atelectasis. No effusions or acute bony abnormality. IMPRESSION: Mild bronchitic changes.  Left base atelectasis. Electronically Signed   By: Charlett NoseKevin  Dover M.D.   On: 05/03/2020 13:49   @PROBHOSP @ DG Chest Port 1 View  Result Date: 05/03/2020 CLINICAL DATA:  Abnormal chest x-ray EXAM: PORTABLE CHEST 1 VIEW COMPARISON:  04/25/2020 FINDINGS: Peribronchial thickening and interstitial prominence. Heart is upper limits normal in size. Left base opacity, likely atelectasis. No effusions or acute bony abnormality. IMPRESSION: Mild bronchitic changes.  Left base atelectasis. Electronically Signed   By: Charlett NoseKevin  Dover M.D.   On: 05/03/2020 13:49        ASSESSMENT AND PLAN    -Multidisciplinary rounds held today  Altered mental status with encephalopathy   - due to toxic metabolic encephalopathy in context of severe acidemia and DKA  - treat underlying DKA  - investigate for secondary causes of DKA - blood cultures, resp cultures, CXR, medication review for compliance   - Covid testing is negative on this admission  -mentation is improved   Diabetic ketoacidosis    - treat per DKA protocol phase 1-3    - empiric antibiotics - zithromax for CAP    Community acquired pnemonia   - wbc count 28>26 - zithrmoax/zosyn empirically    -septic workup in process  Acute Renal Failure-most likely due to DKA -follow chem 7 -follow UO -continue Foley Catheter-assess need daily -d/c nonessential nephrotoxins    Hypnatremia   -pseudo due to elevated blood glucose    - will recheck post treatement of DKA   Severe metabolic derrangements   - due to DKA - will monitor closely as insulin will likely cause additional shift of electrolytes.   ID -continue IV abx as prescibed -follow up  cultures  GI/Nutrition GI PROPHYLAXIS as indicated DIET-->TF's as tolerated Constipation protocol as indicated  ENDO - ICU hypoglycemic\Hyperglycemia protocol -check FSBS per protocol   ELECTROLYTES -follow labs as needed -replace as needed -pharmacy consultation   DVT/GI PRX ordered -SCDs  TRANSFUSIONS AS NEEDED MONITOR FSBS ASSESS the need for LABS as needed   Critical care provider statement:    Critical care time (minutes):  35   Critical care time was exclusive of:  Separately billable procedures and treating other patients   Critical care was necessary to treat or prevent imminent or life-threatening deterioration of the following conditions:  DKA, multiple comorbid conditions.    Critical care was time spent personally by me on the following activities:  Development of treatment plan with patient or surrogate, discussions with consultants, evaluation of patient's response to  treatment, examination of patient, obtaining history from patient or surrogate, ordering and performing treatments and interventions, ordering and review of laboratory studies and re-evaluation of patient's condition.  I assumed direction of critical care for this patient from another provider in my specialty: no    This document was prepared using Dragon voice recognition software and may include unintentional dictation errors.    Vida Rigger, M.D.  Division of Pulmonary & Critical Care Medicine  [facility]

## 2020-05-04 NOTE — ED Notes (Signed)
CBG 116  

## 2020-05-04 NOTE — ED Notes (Signed)
Patient sister called to express concerns for patient being released home, she believes patient is unable to care for herself any longer, she states patient is having more memory issues and doesn't take her medications or eat well. She states she will be trying to find placement in nursing facility and requests social worker consult.

## 2020-05-04 NOTE — ED Notes (Signed)
Pt had loose BM. Pt cleaned up, brief changed and warm blanket placed on pt. Rectal temp 98.6

## 2020-05-04 NOTE — Consult Note (Addendum)
Pharmacy Antibiotic Note  Vicki [name] is a 79 y.o. female admitted on 05/03/2020 with pneumonia.  Pharmacy has been consulted for Zosyn dosing.  Plan: Zosyn 3.375g Q8H (extended infusion)    Temp (24hrs), Avg:99 F (37.2 C), Min:98.1 F (36.7 C), Max:99.8 F (37.7 C)  Recent Labs  Lab 04/28/20 0346 04/28/20 0346 04/28/20 1224 04/29/20 0633 05/03/20 1021 05/03/20 1413 05/03/20 2048 05/04/20 0334  WBC 6.5  --  6.9 5.6 26.2*  --   --  24.0*  CREATININE 0.83   < >  --  0.66 1.90* 2.02* 1.11* 1.39*   < > = values in this interval not displayed.    Estimated Creatinine Clearance: 29 mL/min (A) (by C-G formula based on SCr of 1.39 mg/dL (H)).    No Known Allergies  Antimicrobials this admission: 9/29 Azithromycin >>  9/30 Zosyn >>   Dose adjustments this admission:   Microbiology results:  BCx:  UCx:   Sputum:   MRSA PCR:   Thank you for allowing pharmacy to be a part of this patient's care.  Katha Cabal 05/04/2020 1:25 PM

## 2020-05-04 NOTE — Progress Notes (Signed)
Pt alert to self/place and able to follow commands.  She is currently on 3L O2 via nasal canula with O2 sats @100 % and all other vss.  Pt transitioned off insulin gtt this am and CBG ranging between 116-212.  Pt is stable for transfer to the medsurg unit with off unit telemetry monitoring.  Will continue to monitor and assess pt.  08-09-2005, AGNP  Pulmonary/Critical Care Pager [phone] (please enter 7 digits) PCCM Consult Pager [phone] (please enter 7 digits)

## 2020-05-04 NOTE — Progress Notes (Signed)
PCCM pickup.  Accepted on TRH service starting 10/1  Admitted for DKA 

## 2020-05-04 NOTE — Consult Note (Signed)
PHARMACY CONSULT NOTE - FOLLOW UP  Pharmacy Consult for Electrolyte Monitoring and Replacement   Recent Labs: Potassium (mmol/L)  Date Value  05/04/2020 4.5  04/09/2012 4.0   Magnesium (mg/dL)  Date Value  75/05/2584 1.7  04/05/2012 2.4   Calcium (mg/dL)  Date Value  27/78/2423 7.9 (L)   Calcium, Total (mg/dL)  Date Value  53/61/4431 8.8   Albumin (g/dL)  Date Value  54/00/8676 3.3 (L)  02/17/2012 3.2 (L)   Phosphorus (mg/dL)  Date Value  19/50/9326 3.1   Sodium (mmol/L)  Date Value  05/04/2020 145  04/09/2012 142   Assessment: 79 y.o. female with a past medical history of diabetes, gastric reflux, hypertension, prior MI and CVA presents to the emergency department for decreased responsiveness and elevated blood glucose.  Patient on insulin and bicarb drips.  Pharmacy has been consulted to monitor and replenish electrolytes.  Goal of Therapy:  K > 4 while on insulin drip All other electrolytes WNL  Plan:  --0334 BMP with K 4.5, Mg 1.7, Phos 3.1no further electrolyte replacement warranted at this time.  --BMP q4hours per protocol while on insulin drip.   Will monitor closely. Renal function + Co2 improving.  Valrie Hart A 05/04/2020 5:04 AM

## 2020-05-04 NOTE — Consult Note (Signed)
PHARMACY CONSULT NOTE - FOLLOW UP  Pharmacy Consult for Electrolyte Monitoring and Replacement   Recent Labs: Potassium (mmol/L)  Date Value  05/04/2020 3.5  04/09/2012 4.0   Magnesium (mg/dL)  Date Value  19/50/9326 1.7  04/05/2012 2.4   Calcium (mg/dL)  Date Value  71/24/5809 7.9 (L)   Calcium, Total (mg/dL)  Date Value  98/33/8250 8.8   Albumin (g/dL)  Date Value  53/97/6734 3.3 (L)  02/17/2012 3.2 (L)   Phosphorus (mg/dL)  Date Value  19/37/9024 3.1   Sodium (mmol/L)  Date Value  05/04/2020 145  04/09/2012 142   Assessment: 79 y.o. female with a past medical history of diabetes, gastric reflux, hypertension, prior MI and CVA presents to the emergency department for decreased responsiveness and elevated blood glucose.  Patient on insulin and bicarb drips.  Pharmacy has been consulted to monitor and replenish electrolytes.  Goal of Therapy:  K > 4 while on insulin drip All other electrolytes WNL  Plan:  --0334 BMP with K 4.5, Mg 1.7, Phos 3.1no further electrolyte replacement warranted at this time.  --Patient is now off the insulin drip (stopped at 1019 this morning). Will recheck potassium at 1300 to ensure potassium is WNL. Will recheck BMP with AM labs tomorrow.   Update @ 1335: K 3.5. Will order KCL IV 10 mEq x2 runs and recheck potassium with morning labs.   Will monitor closely. Renal function + Co2 improving.  Katha Cabal 05/04/2020 1:35 PM

## 2020-05-05 ENCOUNTER — Other Ambulatory Visit: Payer: Self-pay

## 2020-05-05 ENCOUNTER — Inpatient Hospital Stay: Payer: Medicare Other

## 2020-05-05 DIAGNOSIS — E1110 Type 2 diabetes mellitus with ketoacidosis without coma: Principal | ICD-10-CM

## 2020-05-05 DIAGNOSIS — E871 Hypo-osmolality and hyponatremia: Secondary | ICD-10-CM

## 2020-05-05 DIAGNOSIS — E876 Hypokalemia: Secondary | ICD-10-CM

## 2020-05-05 DIAGNOSIS — Z9119 Patient's noncompliance with other medical treatment and regimen: Secondary | ICD-10-CM

## 2020-05-05 LAB — CBC
HCT: 29.9 % — ABNORMAL LOW (ref 36.0–46.0)
Hemoglobin: 9.9 g/dL — ABNORMAL LOW (ref 12.0–15.0)
MCH: 31.2 pg (ref 26.0–34.0)
MCHC: 33.1 g/dL (ref 30.0–36.0)
MCV: 94.3 fL (ref 80.0–100.0)
Platelets: 269 10*3/uL (ref 150–400)
RBC: 3.17 MIL/uL — ABNORMAL LOW (ref 3.87–5.11)
RDW: 15.3 % (ref 11.5–15.5)
WBC: 17.3 10*3/uL — ABNORMAL HIGH (ref 4.0–10.5)
nRBC: 0.1 % (ref 0.0–0.2)

## 2020-05-05 LAB — GLUCOSE, CAPILLARY
Glucose-Capillary: 170 mg/dL — ABNORMAL HIGH (ref 70–99)
Glucose-Capillary: 141 mg/dL — ABNORMAL HIGH (ref 70–99)
Glucose-Capillary: 168 mg/dL — ABNORMAL HIGH (ref 70–99)
Glucose-Capillary: 139 mg/dL — ABNORMAL HIGH (ref 70–99)
Glucose-Capillary: 147 mg/dL — ABNORMAL HIGH (ref 70–99)

## 2020-05-05 LAB — BASIC METABOLIC PANEL
Anion gap: 15 (ref 5–15)
BUN: 20 mg/dL (ref 8–23)
CO2: 32 mmol/L (ref 22–32)
Calcium: 7.5 mg/dL — ABNORMAL LOW (ref 8.9–10.3)
Chloride: 96 mmol/L — ABNORMAL LOW (ref 98–111)
Creatinine, Ser: 0.96 mg/dL (ref 0.44–1.00)
GFR calc Af Amer: >60 mL/min (ref >60)
GFR calc non Af Amer: 55 mL/min — ABNORMAL LOW (ref >60)
Glucose, Bld: 178 mg/dL — ABNORMAL HIGH (ref 70–99)
Potassium: 3.2 mmol/L — ABNORMAL LOW (ref 3.5–5.1)
Sodium: 143 mmol/L (ref 135–145)

## 2020-05-05 LAB — PHOSPHORUS: Phosphorus: 3.1 mg/dL (ref 2.5–4.6)

## 2020-05-05 LAB — MAGNESIUM: Magnesium: 1.9 mg/dL (ref 1.7–2.4)

## 2020-05-05 MED ORDER — POTASSIUM CHLORIDE 10 MEQ/100ML IV SOLN: 10.0000 meq | INTRAVENOUS | Status: DC

## 2020-05-05 MED ORDER — INFLUENZA VAC A&B SA ADJ QUAD 0.5 ML IM PRSY
0.5000 mL | PREFILLED_SYRINGE | INTRAMUSCULAR | Status: AC
  Administered 2020-05-06: 0.5 mL via INTRAMUSCULAR
  Filled 2020-05-05: qty 0.5

## 2020-05-05 MED ORDER — ENOXAPARIN SODIUM 40 MG/0.4ML ~~LOC~~ SOLN
40.0000 mg | SUBCUTANEOUS | Status: DC
  Administered 2020-05-05 – 2020-05-08 (×4): 40 mg via SUBCUTANEOUS
  Filled 2020-05-05 (×4): qty 0.4

## 2020-05-05 MED ORDER — POTASSIUM CHLORIDE CRYS ER 20 MEQ PO TBCR
40.0000 meq | EXTENDED_RELEASE_TABLET | Freq: Once | ORAL | Status: AC
  Administered 2020-05-05: 40 meq via ORAL
  Filled 2020-05-05: qty 2

## 2020-05-05 MED ORDER — ACETAMINOPHEN 325 MG PO TABS
650.0000 mg | ORAL_TABLET | Freq: Four times a day (QID) | ORAL | Status: DC | PRN
  Administered 2020-05-05 – 2020-05-08 (×5): 650 mg via ORAL
  Filled 2020-05-05 (×5): qty 2

## 2020-05-05 MED ORDER — KETOROLAC TROMETHAMINE 15 MG/ML IJ SOLN
15.0000 mg | Freq: Once | INTRAMUSCULAR | Status: AC
  Administered 2020-05-05: 15 mg via INTRAVENOUS
  Filled 2020-05-05: qty 1

## 2020-05-05 MED ORDER — IPRATROPIUM-ALBUTEROL 0.5-2.5 (3) MG/3ML IN SOLN
3.0000 mL | Freq: Four times a day (QID) | RESPIRATORY_TRACT | Status: DC
  Administered 2020-05-05 – 2020-05-06 (×3): 3 mL via RESPIRATORY_TRACT
  Filled 2020-05-05 (×2): qty 3

## 2020-05-05 MED ORDER — LACTATED RINGERS IV SOLN: INTRAVENOUS | Status: DC

## 2020-05-05 MED ORDER — ALBUTEROL SULFATE (2.5 MG/3ML) 0.083% IN NEBU: 2.5000 mg | INHALATION_SOLUTION | RESPIRATORY_TRACT | Status: DC | PRN

## 2020-05-05 NOTE — Progress Notes (Signed)
OT Cancellation Note  Patient Details Name: Vicki [name] MRN: [id number] DOB: [birth year]   Cancelled Treatment:    Reason Eval/Treat Not Completed: Medical issues which prohibited therapy. OT evaluation order received and chart reviewed. Pt now pending B LE venous US (DVT)--per therapy guidelines, will hold therapy at this time. OT evaluation will be re-attempted at a later date/time as able/appropriate pending results of B LE venous US.  Jackquline Denmark, MS, OTR/L , CBIS ascom [phone]  05/05/20, 3:36 PM    05/05/2020, 3:35 PM

## 2020-05-05 NOTE — Progress Notes (Signed)
PT Cancellation Note  Patient Details Name: Vicki [name] MRN: [id number] DOB: May 26, 1941   Cancelled Treatment:    Reason Eval/Treat Not Completed: Other (comment).  PT consult received.  Chart reviewed.  Pt now pending B LE venous US (DVT)--per PT guidelines, will hold therapy at this time and re-attempt PT evaluation at a later date/time as able/appropriate pending results of B LE venous US.  Hendricks Limes, PT 05/05/20, 3:21 PM

## 2020-05-05 NOTE — Progress Notes (Signed)
PT Cancellation Note  Patient Details Name: Vicki [name] MRN: [id number] DOB: Aug 11, 1940   Cancelled Treatment:    Reason Eval/Treat Not Completed: Other (comment) Still pending LE [place] results at 16:43, will attempt PT exam tomorrow as appropriate.  Pt has had multiple admits for similar in recent months, clearly is unable to maintain meds/safety at home and will need 24/7 assist (likely transition to SNF).   Malachi Pro, DPT 05/05/2020, 4:43 PM

## 2020-05-05 NOTE — Consult Note (Signed)
Pharmacy Antibiotic Note  Vicki [name] is a 79 y.o. female admitted on 05/03/2020 with pneumonia.  Pharmacy has been consulted for Zosyn dosing.  Day of Zosyn   Plan: Zosyn 3.375g Q8H (extended infusion)  Height: 5\' 3"  (160 cm) Weight: 66 kg (145 lb 8.1 oz) IBW/kg (Calculated) : 52.4  Temp (24hrs), Avg:98.4 F (36.9 C), Min:97.9 F (36.6 C), Max:98.6 F (37 C)  Recent Labs  Lab 04/28/20 1224 04/29/20 0633 04/29/20 0633 05/03/20 1021 05/03/20 1413 05/03/20 2048 05/04/20 0334 05/05/20 0437 05/05/20 0955  WBC 6.9 5.6  --  26.2*  --   --  24.0*  --  17.3*  CREATININE  --  0.66   < > 1.90* 2.02* 1.11* 1.39* 0.96  --    < > = values in this interval not displayed.    Estimated Creatinine Clearance: 41.9 mL/min (by C-G formula based on SCr of 0.96 mg/dL).    No Known Allergies  Antimicrobials this admission: 9/29 Azithromycin >>  9/30 Zosyn >>   Dose adjustments this admission:   Microbiology results: 9/29 10/29 9/29  10/29   Thank you for allowing pharmacy to be a part of this patient's care.  [id number] 05/05/2020 11:51 AM

## 2020-05-05 NOTE — TOC Progression Note (Signed)
Transition of Care [facility]) - Progression Note    Patient Details  Name: Vicki [name] MRN: [id number] Date of Birth: August 10, 1940  Transition of Care [facility]) CM/SW [place], LCSW Phone Number: 05/05/2020, 4:02 PM  Clinical Narrative:    CSW met with patient at bedside, sister Milta Deiters was present also. Patient is requesting possible skilled nursing placement if she meets requirements. CSW discussed local facilities in the area as to offer choice. Patient and sister both agreed their preference is [facility]. CSW completed referral to [facility] and called to confirm insurance is managed by [place]. CSW will submit for insurance authorization as soon as OP/PT are able to complete evals.   Expected Discharge Plan: Skilled Nursing Facility Barriers to Discharge: Continued Medical Work up  Expected Discharge Plan and Services Expected Discharge Plan: [place] In-house Referral: Clinical Social Work     Living arrangements for the past 2 months: Single Family Home                                       Social Determinants of Health (SDOH) Interventions    Readmission Risk Interventions Readmission Risk Prevention Plan 04/28/2020 01/20/2020 10/15/2019  Transportation Screening Complete Complete Complete  PCP or Specialist Appt within 3-5 Days - Complete Complete  HRI or [place] - - Complete  Social Work Consult for [place] Planning/Counseling - Complete -  Palliative Care Screening - Not Applicable -  Medication Review Press photographer) Complete Complete Complete  PCP or Specialist appointment within 3-5 days of discharge Complete - -  [place] or Home Care Consult Complete - -  SW Recovery Care/Counseling Consult Complete - -  Palliative Care Screening Not Applicable - -  [place] Not Applicable - -  Some recent data might be hidden

## 2020-05-05 NOTE — NC FL2 (Signed)
MEDICAID FL2 LEVEL OF CARE SCREENING TOOL     IDENTIFICATION  Patient Name: Vicki [name] Birthdate: 05-10-1941 Sex: female Admission Date (Current Location): 05/03/2020  [place] and [place] Number:  Chiropodist and Address:  [facility], [address], [place], [place] [address]      Provider Number: [id number]  Attending Physician Name and Address:  Lynn Ito, MD  Relative Name and Phone Number:  Robet Leu (Sister) [phone]    Current Level of Care: Hospital Recommended Level of Care: Skilled Nursing Facility Prior Approval Number:    Date Approved/Denied:   PASRR Number: pending  Discharge Plan: SNF    Current Diagnoses: Patient Active Problem List   Diagnosis Date Noted  . Encephalopathy 05/03/2020  . Hypernatremia 04/19/2020  . Hyperkalemia 04/19/2020  . Hyperlipidemia 04/19/2020  . Sepsis (HCC) 01/19/2020  . Alzheimer's dementia without behavioral disturbance (HCC)   . DKA (diabetic ketoacidoses) 10/14/2019  . Diabetic ketoacidosis without coma associated with diabetes mellitus due to underlying condition (HCC)   . Dehydration 10/06/2019  . Generalized weakness 10/06/2019  . AKI (acute kidney injury) (HCC) 10/06/2019  . Hypertension associated with diabetes (HCC)   . Hypoglycemia 12/10/2017  . 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 NSTEMI (non-ST elevated myocardial infarction) (HCC) 10/01/2016  . 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 Diabetes (HCC) 09/29/2016  . GERD (gastroesophageal reflux disease) 09/29/2016  . Nausea and vomiting 03/24/2016  . Diffuse abdominal pain 03/24/2016  . Hypokalemia 03/24/2016  . Chest pain 03/24/2016  . Elevated troponin I level 09/26/2015  . 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HCC) 09/25/2015    Orientation RESPIRATION BLADDER Height & Weight     Self    Incontinent, External catheter Weight: 145 lb  8.1 oz (66 kg) Height:  5\' 3"  (160 cm)  BEHAVIORAL SYMPTOMS/MOOD NEUROLOGICAL BOWEL NUTRITION STATUS      Incontinent    AMBULATORY STATUS COMMUNICATION OF NEEDS Skin   Extensive Assist Verbally                         Personal Care Assistance Level of Assistance  Bathing, Feeding, Dressing Bathing Assistance: Limited assistance Feeding assistance: Limited assistance Dressing Assistance: Limited assistance     Functional Limitations Info             SPECIAL CARE FACTORS FREQUENCY  PT (By licensed PT), OT (By licensed OT)     PT Frequency: 5 x weekly OT Frequency: 5 x weekly            Contractures      Additional Factors Info  Code Status Code Status Info: Full             Current Medications (05/05/2020):  This is the current hospital active medication list Current Facility-Administered Medications  Medication Dose Route Frequency Provider Last Rate Last Admin  . 0.9 %  sodium chloride infusion   Intravenous PRN 07/05/2020, MD   Stopped at 05/05/20 0531  . acetaminophen (TYLENOL) tablet 650 mg  650 mg Oral Q6H PRN 07/05/20, MD   650 mg at 05/05/20 1325  . albuterol (PROVENTIL) (2.5 MG/3ML) 0.083% nebulizer solution 2.5 mg  2.5 mg Nebulization Q4H PRN 07/05/20, MD      . azithromycin (ZITHROMAX) 500 mg in sodium chloride 0.9 % 250 mL IVPB  500 mg Intravenous Q24H Lynn Ito, MD   Stopped at 05/04/20  1525  . dextrose 50 % solution 0-50 mL  0-50 mL Intravenous PRN Minna Antis, MD      . docusate sodium (COLACE) capsule 100 mg  100 mg Oral BID PRN Vida Rigger, MD      . enoxaparin (LOVENOX) injection 30 mg  30 mg Subcutaneous Q24H Albina Billet, RPH   30 mg at 05/04/20 2311  . famotidine (PEPCID) IVPB 20 mg premix  20 mg Intravenous Q48H Tressie Ellis, RPH 100 mL/hr at 05/05/20 1057 20 mg at 05/05/20 1057  . hydrALAZINE (APRESOLINE) injection 10-20 mg  10-20 mg Intravenous Q4H PRN Eugenie Norrie, NP      . Melene Muller ON  05/06/2020] influenza vaccine adjuvanted (FLUAD) injection 0.5 mL  0.5 mL Intramuscular Tomorrow-1000 Aleskerov, Fuad, MD      . insulin aspart (novoLOG) injection 0-9 Units  0-9 Units Subcutaneous Q4H Eugenie Norrie, NP   2 Units at 05/05/20 1233  . insulin aspart (novoLOG) injection 5 Units  5 Units Subcutaneous TID WC Vida Rigger, MD   5 Units at 05/05/20 1233  . insulin glargine (LANTUS) injection 20 Units  20 Units Subcutaneous Daily Eugenie Norrie, NP   20 Units at 05/05/20 1055  . ipratropium-albuterol (DUONEB) 0.5-2.5 (3) MG/3ML nebulizer solution 3 mL  3 mL Nebulization Q6H Lynn Ito, MD      . lactated ringers infusion   Intravenous Continuous Lynn Ito, MD 75 mL/hr at 05/05/20 1207 New Bag at 05/05/20 1207  . piperacillin-tazobactam (ZOSYN) IVPB 3.375 g  3.375 g Intravenous Q8H Duncan, Asajah R, RPH 12.5 mL/hr at 05/05/20 0531 3.375 g at 05/05/20 0531  . polyethylene glycol (MIRALAX / GLYCOLAX) packet 17 g  17 g Oral Daily PRN Vida Rigger, MD      . sodium bicarbonate 150 mEq in sterile water 1,000 mL infusion   Intravenous Continuous Minna Antis, MD   Paused at 05/04/20 1931     Discharge Medications: Please see discharge summary for a list of discharge medications.  Relevant Imaging Results:  Relevant Lab Results:   Additional Information [phone]  Eilleen Kempf, [place]

## 2020-05-05 NOTE — Progress Notes (Signed)
PHARMACIST - PHYSICIAN COMMUNICATION  CONCERNING:  Enoxaparin (Lovenox) for DVT Prophylaxis    RECOMMENDATION: Patient was prescribed enoxaprin 30mg  q24 hours for VTE prophylaxis.   Estimated Creatinine Clearance: 41.9 mL/min (by C-G formula based on SCr of 0.96 mg/dL).  Patient is candidate for enoxaparin 40mg  every 24 hours based on CrCl >102ml/min  DESCRIPTION: Pharmacy has adjusted enoxaparin dose per [facility] policy.  Patient is now receiving enoxaparin 40 mg every 24 hours   31m, PharmD Clinical Pharmacist 05/05/2020 3:25 PM;

## 2020-05-05 NOTE — Consult Note (Addendum)
PHARMACY CONSULT NOTE - FOLLOW UP  Pharmacy Consult for Electrolyte Monitoring and Replacement   Recent Labs: Potassium (mmol/L)  Date Value  05/05/2020 3.2 (L)  04/09/2012 4.0   Magnesium (mg/dL)  Date Value  20/23/3435 1.9  04/05/2012 2.4   Calcium (mg/dL)  Date Value  68/61/6837 7.5 (L)   Calcium, Total (mg/dL)  Date Value  29/09/1113 8.8   Albumin (g/dL)  Date Value  52/03/222 3.3 (L)  02/17/2012 3.2 (L)   Phosphorus (mg/dL)  Date Value  36/07/2448 3.1   Sodium (mmol/L)  Date Value  05/05/2020 143  04/09/2012 142   Assessment: 79 y.o. female with a past medical history of diabetes, gastric reflux, hypertension, prior MI and CVA presents to the emergency department for decreased responsiveness and elevated blood glucose.   Pharmacy has been consulted to monitor and replenish electrolytes.  HYPOkalemia. Potassium yesterday evening was 3.5 (after insulin drip stopped). KCL IV 10 mEqx2 runs was ordered. Per chart review, patient received a partial potassium bag and refused the remaining potassium runs. D/w nursing this morning and patient is able to take oral medications.   Goal of Therapy:   Electrolytes WNL  Plan:   Will order KCL PO 40 mEq x1 dose and recheck BMP tomorrow with AM labs.  Katha Cabal 05/05/2020 7:25 AM

## 2020-05-05 NOTE — Plan of Care (Signed)
  Problem: Education: Goal: Ability to describe self-care measures that may prevent or decrease complications (Diabetes Survival Skills Education) will improve Outcome: Progressing Goal: Individualized Educational Video(s) Outcome: Progressing   Problem: Cardiac: Goal: Ability to maintain an adequate cardiac output will improve Outcome: Progressing   Problem: Health Behavior/Discharge Planning: Goal: Ability to identify and utilize available resources and services will improve Outcome: Progressing Goal: Ability to manage health-related needs will improve Outcome: Progressing   Problem: Fluid Volume: Goal: Ability to achieve a balanced intake and output will improve Outcome: Progressing   Problem: Metabolic: Goal: Ability to maintain appropriate glucose levels will improve Outcome: Progressing   Problem: Nutritional: Goal: Maintenance of adequate nutrition will improve Outcome: Progressing Goal: Maintenance of adequate weight for body size and type will improve Outcome: Progressing   Problem: Respiratory: Goal: Will regain and/or maintain adequate ventilation Outcome: Progressing   Problem: Urinary Elimination: Goal: Ability to achieve and maintain adequate renal perfusion and functioning will improve Outcome: Progressing   Problem: Clinical Measurements: Goal: Ability to maintain clinical measurements within normal limits will improve Outcome: Progressing Goal: Will remain free from infection Outcome: Progressing Goal: Diagnostic test results will improve Outcome: Progressing Goal: Respiratory complications will improve Outcome: Progressing Goal: Cardiovascular complication will be avoided Outcome: Progressing   Problem: Activity: Goal: Risk for activity intolerance will decrease Outcome: Progressing   Problem: Nutrition: Goal: Adequate nutrition will be maintained Outcome: Progressing   Problem: Coping: Goal: Level of anxiety will decrease Outcome:  Progressing   Problem: Elimination: Goal: Will not experience complications related to bowel motility Outcome: Progressing Goal: Will not experience complications related to urinary retention Outcome: Progressing   Problem: Pain Managment: Goal: General experience of comfort will improve Outcome: Progressing   Problem: Safety: Goal: Ability to remain free from injury will improve Outcome: Progressing   Problem: Skin Integrity: Goal: Risk for impaired skin integrity will decrease Outcome: Progressing

## 2020-05-05 NOTE — Progress Notes (Signed)
PROGRESS NOTE    Vicki HippsRoberta [name]  ZOX:[id number] DOB: February 03, 1941 DOA: 05/03/2020 PCP: Lynnea FerrierKlein, Bert J III, MD    Brief Narrative:  Pt admitted for DKA. Was admitted to ICU now transferred to the medical floor    Consultants:   pccm  Procedures:   Antimicrobials:   Azithro, zosyn   Subjective: Has no complaints. Sister at bedside tells me pt unable to take care of herself as she keeps forgetting to take her meds. C/oLE pain b/l. Objective: Vitals:   05/05/20 0747 05/05/20 1052 05/05/20 1436 05/05/20 1703  BP:  (!) 150/66  (!) 144/67  Pulse: 74 82 87 84  Resp: 16 17 16 17   Temp:  98.6 F (37 C)  98.6 F (37 C)  TempSrc:  Oral  Oral  SpO2: 97% 100% 97% 100%  Weight:      Height:        Intake/Output Summary (Last 24 hours) at 05/05/2020 1924 Last data filed at 05/05/2020 1658 Gross per 24 hour  Intake 2854.92 ml  Output 400 ml  Net 2454.92 ml   Filed Weights   05/05/20 0300  Weight: 66 kg    Examination:  General exam: Appears calm and comfortable  Respiratory system: Clear to auscultation. Respiratory effort normal. Cardiovascular system: S1 & S2 heard, RRR. No JVD, murmurs, rubs, gallops or clicks.  Gastrointestinal system: Abdomen is nondistended, soft and nontender. No organomegaly or masses felt. Normal bowel sounds heard. Central nervous system: Alert and oriented. Grossly intact Extremities: no edema Skin: warm, dry Psychiatry: Judgement and insight appear normal. Mood & affect appropriate.     Data Reviewed: I have personally reviewed following labs and imaging studies  CBC: Recent Labs  Lab 04/29/20 0633 05/03/20 1021 05/04/20 0334 05/05/20 0955  WBC 5.6 26.2* 24.0* 17.3*  NEUTROABS 2.9  --   --   --   HGB 10.6* 10.6* 9.9* 9.9*  HCT 31.4* 36.8 29.7* 29.9*  MCV 92.1 108.6* 94.3 94.3  PLT 210 343 288 269   Basic Metabolic Panel: Recent Labs  Lab 04/29/20 0633 04/29/20 0633 05/03/20 1021 05/03/20 1021 05/03/20 1413  05/03/20 2048 05/04/20 0334 05/04/20 1304 05/05/20 0437  NA 140   < > 134*  --  137 141 145  --  143  K 3.3*   < > 6.0*   < > 4.7 2.9* 4.5 3.5 3.2*  CL 104   < > 96*  --  97* 111 102  --  96*  CO2 28   < > <7*  --  <7* 12* 29  --  32  GLUCOSE 164*   < > 841*  --  720* 252* 155*  --  178*  BUN 9   < > 30*  --  31* 21 27*  --  20  CREATININE 0.66   < > 1.90*  --  2.02* 1.11* 1.39*  --  0.96  CALCIUM 8.4*   < > 8.8*  --  8.0* 6.1* 7.9*  --  7.5*  MG 1.6*  --  2.3  --   --   --  1.7  --  1.9  PHOS 2.5  --  9.2*  --   --   --  3.1  --  3.1   < > = values in this interval not displayed.   GFR: Estimated Creatinine Clearance: 41.9 mL/min (by C-G formula based on SCr of 0.96 mg/dL). Liver Function Tests: Recent Labs  Lab 05/03/20 1021  AST 42*  ALT  55*  ALKPHOS 80  BILITOT 2.0*  PROT 6.4*  ALBUMIN 3.3*   Recent Labs  Lab 05/03/20 1021  LIPASE 27   No results for input(s): AMMONIA in the last 168 hours. Coagulation Profile: Recent Labs  Lab 05/03/20 1021  INR 1.1   Cardiac Enzymes: No results for input(s): CKTOTAL, CKMB, CKMBINDEX, TROPONINI in the last 168 hours. BNP (last 3 results) No results for input(s): PROBNP in the last 8760 hours. HbA1C: No results for input(s): HGBA1C in the last 72 hours. CBG: Recent Labs  Lab 05/04/20 2354 05/05/20 0431 05/05/20 0805 05/05/20 1128 05/05/20 1646  GLUCAP 108* 170* 141* 168* 139*   Lipid Profile: No results for input(s): CHOL, HDL, LDLCALC, TRIG, CHOLHDL, LDLDIRECT in the last 72 hours. Thyroid Function Tests: No results for input(s): TSH, T4TOTAL, FREET4, T3FREE, THYROIDAB in the last 72 hours. Anemia Panel: No results for input(s): VITAMINB12, FOLATE, FERRITIN, TIBC, IRON, RETICCTPCT in the last 72 hours. Sepsis Labs: Recent Labs  Lab 05/03/20 1021  PROCALCITON 0.41    Recent Results (from the past 240 hour(s))  SARS Coronavirus 2 by RT PCR (hospital order, performed in [facility] hospital lab) Nasopharyngeal  Nasopharyngeal Swab     Status: None   Collection Time: 04/25/20  8:07 PM   Specimen: Nasopharyngeal Swab  Result Value Ref Range Status   SARS Coronavirus 2 NEGATIVE NEGATIVE Final    Comment: (NOTE) SARS-CoV-2 target nucleic acids are NOT DETECTED.  The SARS-CoV-2 RNA is generally detectable in upper and lower respiratory specimens during the acute phase of infection. The lowest concentration of SARS-CoV-2 viral copies this assay can detect is 250 copies / mL. A negative result does not preclude SARS-CoV-2 infection and should not be used as the sole basis for treatment or other patient management decisions.  A negative result may occur with improper specimen collection / handling, submission of specimen other than nasopharyngeal swab, presence of viral mutation(s) within the areas targeted by this assay, and inadequate number of viral copies (<250 copies / mL). A negative result must be combined with clinical observations, patient history, and epidemiological information.  Fact Sheet for Patients:   BoilerBrush.com.cy  Fact Sheet for Healthcare Providers: https://pope.com/  This test is not yet approved or  cleared by the [place] FDA and has been authorized for detection and/or diagnosis of SARS-CoV-2 by FDA under an Emergency Use Authorization (EUA).  This EUA will remain in effect (meaning this test can be used) for the duration of the COVID-19 declaration under Section 564(b)(1) of the Act, 21 U.S.C. section 360bbb-3(b)(1), unless the authorization is terminated or revoked sooner.  Performed at [facility], [address] [place], [place] [address]   Culture, Urine     Status: Abnormal   Collection Time: 04/25/20 11:49 PM   Specimen: Urine, Clean Catch  Result Value Ref Range Status   Specimen Description   Final    URINE, CLEAN CATCH Performed at [facility], [address], [place],  [place] [address]    Special Requests   Final    NONE Performed at [facility], [address] [place], [place] [address]    Culture MULTIPLE SPECIES PRESENT, SUGGEST RECOLLECTION (A)  Final   Report Status 04/26/2020 FINAL  Final  Culture, blood (routine x 2)     Status: None   Collection Time: 04/26/20 12:11 AM   Specimen: BLOOD  Result Value Ref Range Status   Specimen Description BLOOD RIGHT ANTECUBITAL  Final  Special Requests   Final    BOTTLES DRAWN AEROBIC AND ANAEROBIC Blood Culture adequate volume   Culture   Final    NO GROWTH 5 DAYS Performed at [facility], [address] [place], [place] [address]    Report Status 05/01/2020 FINAL  Final  Culture, blood (routine x 2)     Status: None   Collection Time: 04/26/20  1:52 AM   Specimen: BLOOD  Result Value Ref Range Status   Specimen Description BLOOD RIGHT AC  Final   Special Requests   Final    BOTTLES DRAWN AEROBIC AND ANAEROBIC Blood Culture results may not be optimal due to an excessive volume of blood received in culture bottles   Culture   Final    NO GROWTH 5 DAYS Performed at [facility], [address] [place], [place] [address]    Report Status 05/01/2020 FINAL  Final  Respiratory Panel by RT PCR (Flu A&B, Covid) - Nasopharyngeal Swab     Status: None   Collection Time: 05/03/20 10:21 AM   Specimen: Nasopharyngeal Swab  Result Value Ref Range Status   SARS Coronavirus 2 by RT PCR NEGATIVE NEGATIVE Final    Comment: (NOTE) SARS-CoV-2 target nucleic acids are NOT DETECTED.  The SARS-CoV-2 RNA is generally detectable in upper respiratoy specimens during the acute phase of infection. The lowest concentration of SARS-CoV-2 viral copies this assay can detect is 131 copies/mL. A negative result does not preclude SARS-Cov-2 infection and should not be used as the sole basis for treatment or other patient management decisions. A negative result may occur with  improper specimen  collection/handling, submission of specimen other than nasopharyngeal swab, presence of viral mutation(s) within the areas targeted by this assay, and inadequate number of viral copies (<131 copies/mL). A negative result must be combined with clinical observations, patient history, and epidemiological information. The expected result is Negative.  Fact Sheet for Patients:  https://www.moore.com/  Fact Sheet for Healthcare Providers:  https://www.young.biz/  This test is no t yet approved or cleared by the [place] FDA and  has been authorized for detection and/or diagnosis of SARS-CoV-2 by FDA under an Emergency Use Authorization (EUA). This EUA will remain  in effect (meaning this test can be used) for the duration of the COVID-19 declaration under Section 564(b)(1) of the Act, 21 U.S.C. section 360bbb-3(b)(1), unless the authorization is terminated or revoked sooner.     Influenza A by PCR NEGATIVE NEGATIVE Final   Influenza B by PCR NEGATIVE NEGATIVE Final    Comment: (NOTE) The Xpert Xpress SARS-CoV-2/FLU/RSV assay is intended as an aid in  the diagnosis of influenza from Nasopharyngeal swab specimens and  should not be used as a sole basis for treatment. Nasal washings and  aspirates are unacceptable for Xpert Xpress SARS-CoV-2/FLU/RSV  testing.  Fact Sheet for Patients: https://www.moore.com/  Fact Sheet for Healthcare Providers: https://www.young.biz/  This test is not yet approved or cleared by the [place] FDA and  has been authorized for detection and/or diagnosis of SARS-CoV-2 by  FDA under an Emergency Use Authorization (EUA). This EUA will remain  in effect (meaning this test can be used) for the duration of the  Covid-19 declaration under Section 564(b)(1) of the Act, 21  U.S.C. section 360bbb-3(b)(1), unless the authorization is  terminated or revoked. Performed at [facility], [address] [place], [place] [address]   Urine Culture     Status: None   Collection Time: 05/03/20 10:21  AM   Specimen: Urine, Catheterized  Result Value Ref Range Status   Specimen Description   Final    URINE, CATHETERIZED Performed at [facility], [address], [place], [place] [address]    Special Requests   Final    NONE Performed at [facility], [address], [place], [place] [address]    Culture   Final    NO GROWTH Performed at [facility], [address], [place], [place] [address]    Report Status 05/04/2020 FINAL  Final  Culture, blood (routine x 2)     Status: None (Preliminary result)   Collection Time: 05/03/20  2:13 PM   Specimen: BLOOD  Result Value Ref Range Status   Specimen Description BLOOD LEFT ANTECUBITAL  Final   Special Requests   Final    BOTTLES DRAWN AEROBIC AND ANAEROBIC Blood Culture adequate volume   Culture   Final    NO GROWTH 2 DAYS Performed at [facility], [address] [place], [place] [address]    Report Status PENDING  Incomplete  Culture, blood (routine x 2)     Status: None (Preliminary result)   Collection Time: 05/03/20  2:13 PM   Specimen: BLOOD  Result Value Ref Range Status   Specimen Description BLOOD BLOOD LEFT HAND  Final   Special Requests   Final    BOTTLES DRAWN AEROBIC AND ANAEROBIC Blood Culture results may not be optimal due to an inadequate volume of blood received in culture bottles   Culture   Final    NO GROWTH 2 DAYS Performed at [facility], [address] [place], [place] [address]    Report Status PENDING  Incomplete         Radiology Studies: US Venous Img Lower Bilateral (DVT)  Result Date: 05/05/2020 CLINICAL DATA:  Bilateral lower extremity pain and edema. EXAM: BILATERAL LOWER EXTREMITY VENOUS DOPPLER ULTRASOUND TECHNIQUE: Gray-scale sonography with graded compression, as well as color Doppler and duplex ultrasound were  performed to evaluate the lower extremity deep venous systems from the level of the common femoral vein and including the common femoral, femoral, profunda femoral, popliteal and calf veins including the posterior tibial, peroneal and gastrocnemius veins when visible. The superficial great saphenous vein was also interrogated. Spectral Doppler was utilized to evaluate flow at rest and with distal augmentation maneuvers in the common femoral, femoral and popliteal veins. COMPARISON:  None. FINDINGS: RIGHT LOWER EXTREMITY Common Femoral Vein: No evidence of thrombus. Normal compressibility, respiratory phasicity and response to augmentation. Saphenofemoral Junction: No evidence of thrombus. Normal compressibility and flow on color Doppler imaging. Profunda Femoral Vein: No evidence of thrombus. Normal compressibility and flow on color Doppler imaging. Femoral Vein: No evidence of thrombus. Normal compressibility, respiratory phasicity and response to augmentation. Popliteal Vein: No evidence of thrombus. Normal compressibility, respiratory phasicity and response to augmentation. Calf Veins: No evidence of thrombus. Normal compressibility and flow on color Doppler imaging. Superficial Great Saphenous Vein: No evidence of thrombus. Normal compressibility. Venous Reflux:  None. Other Findings: No evidence of superficial thrombophlebitis or abnormal fluid collection. LEFT LOWER EXTREMITY Common Femoral Vein: No evidence of thrombus. Normal compressibility, respiratory phasicity and response to augmentation. Saphenofemoral Junction: No evidence of thrombus. Normal compressibility and flow on color Doppler imaging. Profunda Femoral Vein: No evidence of thrombus. Normal compressibility and flow on color Doppler imaging. Femoral Vein: No evidence of thrombus. Normal compressibility, respiratory phasicity and response to augmentation. Popliteal Vein: No evidence of  thrombus. Normal compressibility, respiratory phasicity and  response to augmentation. Calf Veins: No evidence of thrombus. Normal compressibility and flow on color Doppler imaging. Superficial Great Saphenous Vein: No evidence of thrombus. Normal compressibility. Venous Reflux:  None. Other Findings: No evidence of superficial thrombophlebitis or abnormal fluid collection. IMPRESSION: No evidence of deep venous thrombosis in either lower extremity. Electronically Signed   By: Irish Lack M.D.   On: 05/05/2020 16:46        Scheduled Meds: . enoxaparin (LOVENOX) injection  40 mg Subcutaneous Q24H  . [START ON 05/06/2020] influenza vaccine adjuvanted  0.5 mL Intramuscular Tomorrow-1000  . insulin aspart  0-9 Units Subcutaneous Q4H  . insulin aspart  5 Units Subcutaneous TID WC  . insulin glargine  20 Units Subcutaneous Daily  . ipratropium-albuterol  3 mL Nebulization Q6H   Continuous Infusions: . sodium chloride Stopped (05/05/20 0531)  . azithromycin 500 mg (05/05/20 1403)  . famotidine (PEPCID) IV 20 mg (05/05/20 1057)  . lactated ringers 75 mL/hr at 05/05/20 1207  . piperacillin-tazobactam (ZOSYN)  IV 3.375 g (05/05/20 1655)  .  sodium bicarbonate (isotonic) infusion in sterile water Stopped (05/04/20 1931)    Assessment & Plan:   Active Problems:   Encephalopathy   AMS with encephalopathy- on admisstion. Due to toxic metabolic encph in context of severe acidemia dn DKA Now has improved.  bcx so far negative. covid negative Continue iv abx for now   DKA- Was treated with DKA protocal , off insulin gtt.  Monitor bg closely Likely 2/2 non compliance  Hyponatremia-due to elevated BG Improved with improvement of glucose levels  Hyponatremia- K 3.2 Will replace with supplements Monitor closely  LE b/l pain- will obtain venous us->>>negative dvt  Medical noncompliance- pt's sister stated pt unable to go back to [facility] as [facility] live [facility] and pt not taking her meds as she forgets. With multiple admissions . Did discuss  palliative care consult and sister was interested in pallaitive care. PT evaluation as pt will need placement  DVT prophylaxis: lovenox Code Status:full Family Communication: sister at bedside  Status is: Inpatient  Remains inpatient appropriate because:Inpatient level of care appropriate due to severity of illness   Dispo: The patient is from: Home              Anticipated d/c is to: SNF              Anticipated d/c date is: 1 day              Patient currently is not medically stable to d/c.Needs PT evaluation and needs placement             LOS: 2 days   Time spent: 45 min with >50 on coc    Lynn Ito, MD [facility] Pager [phone]  If 7PM-7AM, please contact night-coverage [email or web address] Password [facility] 05/05/2020, 7:24 PM

## 2020-05-05 NOTE — Care Management Important Message (Signed)
Important Message  Patient Details  Name: Vicki [name] MRN: [id number] Date of Birth: [birth year]   Medicare Important Message Given:  Yes  Initial Medicare IM given by Patient Access Associate on 05/05/2020 at 11:50am.   Johnell Comings 05/05/2020, 1:54 PM

## 2020-05-06 ENCOUNTER — Inpatient Hospital Stay: Payer: Medicare Other

## 2020-05-06 DIAGNOSIS — R29898 Other symptoms and signs involving the musculoskeletal system: Secondary | ICD-10-CM

## 2020-05-06 LAB — BASIC METABOLIC PANEL
Anion gap: 8 (ref 5–15)
BUN: 12 mg/dL (ref 8–23)
CO2: 30 mmol/L (ref 22–32)
Calcium: 7.9 mg/dL — ABNORMAL LOW (ref 8.9–10.3)
Chloride: 101 mmol/L (ref 98–111)
Creatinine, Ser: 0.85 mg/dL (ref 0.44–1.00)
GFR calc Af Amer: >60 mL/min (ref >60)
GFR calc non Af Amer: >60 mL/min (ref >60)
Glucose, Bld: 153 mg/dL — ABNORMAL HIGH (ref 70–99)
Potassium: 3.7 mmol/L (ref 3.5–5.1)
Sodium: 139 mmol/L (ref 135–145)

## 2020-05-06 LAB — GLUCOSE, CAPILLARY
Glucose-Capillary: 141 mg/dL — ABNORMAL HIGH (ref 70–99)
Glucose-Capillary: 154 mg/dL — ABNORMAL HIGH (ref 70–99)
Glucose-Capillary: 158 mg/dL — ABNORMAL HIGH (ref 70–99)
Glucose-Capillary: 39 mg/dL — CL (ref 70–99)
Glucose-Capillary: 41 mg/dL — CL (ref 70–99)
Glucose-Capillary: 45 mg/dL — ABNORMAL LOW (ref 70–99)
Glucose-Capillary: 81 mg/dL (ref 70–99)
Glucose-Capillary: 82 mg/dL (ref 70–99)
Glucose-Capillary: 83 mg/dL (ref 70–99)

## 2020-05-06 LAB — MAGNESIUM: Magnesium: 1.8 mg/dL (ref 1.7–2.4)

## 2020-05-06 LAB — CBC
HCT: 26.8 % — ABNORMAL LOW (ref 36.0–46.0)
Hemoglobin: 9 g/dL — ABNORMAL LOW (ref 12.0–15.0)
MCH: 31.6 pg (ref 26.0–34.0)
MCHC: 33.6 g/dL (ref 30.0–36.0)
MCV: 94 fL (ref 80.0–100.0)
Platelets: 254 10*3/uL (ref 150–400)
RBC: 2.85 MIL/uL — ABNORMAL LOW (ref 3.87–5.11)
RDW: 15.3 % (ref 11.5–15.5)
WBC: 9.4 10*3/uL (ref 4.0–10.5)
nRBC: 0 % (ref 0.0–0.2)

## 2020-05-06 MED ORDER — MAGNESIUM OXIDE 400 (241.3 MG) MG PO TABS
400.0000 mg | ORAL_TABLET | Freq: Two times a day (BID) | ORAL | Status: AC
  Administered 2020-05-06 (×2): 400 mg via ORAL
  Filled 2020-05-06 (×2): qty 1

## 2020-05-06 MED ORDER — MAGNESIUM OXIDE 400 (241.3 MG) MG PO TABS: 400.0000 mg | ORAL_TABLET | Freq: Two times a day (BID) | ORAL | Status: DC

## 2020-05-06 MED ORDER — INSULIN GLARGINE 100 UNIT/ML ~~LOC~~ SOLN
18.0000 [IU] | Freq: Every day | SUBCUTANEOUS | Status: DC
  Filled 2020-05-06: qty 0.18

## 2020-05-06 MED ORDER — IPRATROPIUM-ALBUTEROL 0.5-2.5 (3) MG/3ML IN SOLN
3.0000 mL | Freq: Two times a day (BID) | RESPIRATORY_TRACT | Status: DC
  Administered 2020-05-07: 3 mL via RESPIRATORY_TRACT
  Filled 2020-05-06 (×2): qty 3

## 2020-05-06 MED ORDER — INSULIN ASPART 100 UNIT/ML ~~LOC~~ SOLN: 5.0000 [IU] | Freq: Three times a day (TID) | SUBCUTANEOUS | Status: DC

## 2020-05-06 MED ORDER — INSULIN ASPART 100 UNIT/ML ~~LOC~~ SOLN: 3.0000 [IU] | Freq: Three times a day (TID) | SUBCUTANEOUS | Status: DC

## 2020-05-06 NOTE — Progress Notes (Addendum)
Diabetic pt with hypoglycemic CBG reading. CBG: 45 at 16:32. Notified MD. Per MD: Give pt Orange juice (5 cups of 4 oucnes) D/C IVF 75 mL/hr lactated ringer. Recheck CBG post 30 minutes to 1 hour. Rechecked CBG at 17:40. CBG:39. Charge nurse notified.  Administered 4 cups of 4 ounces Orange juice. Pt status: alert/oriented x 4. Sitting up on side of bed, eating dinner. Rechecked CBG at 18:35. Current reading CBG: 82. MD notified. Per MD verbal order: Keep sliding scale order on MAR. Discontinue daily Lantus 18 units. Per MD, give 1 Amp of 50% dextrose now.  Administered 1 amp of 50% at 18:55. Notified Night shift nurse to check CBG post 1 hour of administration.

## 2020-05-06 NOTE — Evaluation (Signed)
Physical Therapy Evaluation Patient Details Name: Vicki [name] MRN: [id number] DOB: Jan 03, 1941 Today's Date: 05/06/2020   History of Present Illness  presented to ER secondary to lethargy, decreased responsiveness; admitted for management of DKA, acute metabolic encephalopathy  Clinical Impression  Upon evaluation, patient alert and oriented to self, location and situation; follows simple commands, but intermittently confused at times, with limited insight, limited recall of new information.  Patient endorsing sensory deficits to L hand and lower extremity (knee distally); associated L UE < LE weakness noted with strength/mobility assessment.  MD informed/aware.  Currently requiring cga/close sup for bed mobility; min/mod assist for sit/stand, basic transfers and gait (30') with RW.  Demonstrates steppage gait pattern with bilat foot drop (L > R), mild L genu recuvatum in loading phases; poor balance, heavy WBing bilat UEs; unsafe/unable to complete without +1 at all times Would benefit from skilled PT to address above deficits and promote optimal return to PLOF.; recommend transition to STR upon discharge from acute hospitalization.     Follow Up Recommendations SNF    Equipment Recommendations       Recommendations for Other Services       Precautions / Restrictions Precautions Precautions: Fall Restrictions Weight Bearing Restrictions: No      Mobility  Bed Mobility Overal bed mobility: Needs Assistance Bed Mobility: Supine to Sit     Supine to sit: Supervision     General bed mobility comments: heavy use of UEs to assist with movement transition  Transfers Overall transfer level: Needs assistance Equipment used: Rolling walker (2 wheeled) Transfers: Sit to/from Stand Sit to Stand: Min assist;Mod assist         General transfer comment: step-by-step cuing for hand and foot placement; extensive assist for lift off and initial standing balance (tendancy towards  posterior LOB); heavy use of bilat UEs to complete  Ambulation/Gait Ambulation/Gait assistance: Min assist Gait Distance (Feet): 30 Feet Assistive device: Rolling walker (2 wheeled)       General Gait Details: steppage gait pattern with bilat foot drop (L > R), mild L genu recuvatum in loading phases; poor balance, heavy WBing bilat UEs; unsafe/unable to complete without +1 at all times  Stairs            Wheelchair Mobility    Modified Rankin (Stroke Patients Only)       Balance Overall balance assessment: Needs assistance Sitting-balance support: No upper extremity supported;Feet supported Sitting balance-Leahy Scale: Good     Standing balance support: Bilateral upper extremity supported Standing balance-Leahy Scale: Poor                               Pertinent Vitals/Pain Pain Assessment: Faces Faces Pain Scale: Hurts a little bit Pain Location: Chronic R LE and hip pain/soreness Pain Descriptors / Indicators: Sore Pain Intervention(s): Limited activity within patient's tolerance;Monitored during session;Repositioned    Home Living Family/patient expects to be discharged to:: Private residence Living Arrangements: Other relatives Available Help at Discharge: Family;Available PRN/intermittently Type of Home: House Home Access: Stairs to enter Entrance Stairs-Rails: Doctor, general practice of Steps: 3 Home Layout: One level Home Equipment: Cane - single point;Shower seat;Grab bars - tub/shower;Hand held Careers information officer - 2 wheels      Prior Function Level of Independence: Independent with assistive device(s);Needs assistance         Comments: Ambulatory with RW for household and limited community mobilization; does endorse at least 1-2 falls in  previous 6 months, noted decline in functional ability in recent weeks/months     Hand Dominance   Dominant Hand: Right    Extremity/Trunk Assessment   Upper Extremity  Assessment Upper Extremity Assessment: Generalized weakness (Bilat UEs grossly 4-/5; does report decreased sensory awareness in L hand)    Lower Extremity Assessment Lower Extremity Assessment: Generalized weakness (R LE: hip 4/5, knee ext 4/5, knee flex 4/5, ankle DF 2-/5; L LE hip 4-/5, knee ext 4-/5, knee flex 3/5, ankle DF 1/5.  Reported sensory deficit L LE, knee distally)       Communication   Communication: No difficulties  Cognition Arousal/Alertness: Awake/alert Behavior During Therapy: WFL for tasks assessed/performed                                   General Comments: oriented to self, location and general situation; mild confusion evident at times, limited insight/awareness, limited recall of new information      General Comments      Exercises Other Exercises Other Exercises: Sit/stand from edge of bed, recliner, min/mod assist for lift off and standing balance; poor recall/carry-over of education for hand/foot placement and overall transfer mechanics Other Exercises: Bed/chair transfer with RW, min/mod assist-decreased safety awareness when approaching seating surface, tends to release grasp of RW, mod assist to maintain balance   Assessment/Plan    PT Assessment Patient needs continued PT services  PT Problem List Decreased strength;Decreased range of motion;Decreased balance;Decreased activity tolerance;Decreased mobility;Decreased knowledge of use of DME;Decreased safety awareness;Decreased cognition;Decreased coordination;Impaired sensation       PT Treatment Interventions DME instruction;Gait training;Stair training;Functional mobility training;Therapeutic activities;Therapeutic exercise;Balance training;Neuromuscular re-education;Patient/family education;Cognitive remediation    PT Goals (Current goals can be found in the Care Plan section)  Acute Rehab PT Goals Patient Stated Goal: to stay as independent as i can PT Goal Formulation: With  patient Time For Goal Achievement: 05/20/20 Potential to Achieve Goals: Good    Frequency Min 2X/week   Barriers to discharge        Co-evaluation               AM-PAC PT "6 Clicks" Mobility  Outcome Measure Help needed turning from your back to your side while in a flat bed without using bedrails?: None Help needed moving from lying on your back to sitting on the side of a flat bed without using bedrails?: A Little Help needed moving to and from a bed to a chair (including a wheelchair)?: A Lot Help needed standing up from a chair using your arms (e.g., wheelchair or bedside chair)?: A Lot Help needed to walk in hospital room?: A Little Help needed climbing 3-5 steps with a railing? : A Lot 6 Click Score: 16    End of Session Equipment Utilized During Treatment: Gait belt Activity Tolerance: Patient tolerated treatment well Patient left: in chair;with call bell/phone within reach;with chair alarm set Nurse Communication: Mobility status PT Visit Diagnosis: Muscle weakness (generalized) (M62.81);Difficulty in walking, not elsewhere classified (R26.2);Unsteadiness on feet (R26.81)    Time: 7824-2353 PT Time Calculation (min) (ACUTE ONLY): 25 min   Charges:   PT Evaluation $PT Eval Moderate Complexity: 1 Mod PT Treatments $Therapeutic Activity: 8-22 mins        Keylen Eckenrode H. Manson Passey, PT, DPT, NCS 05/06/20, 9:44 AM [phone]

## 2020-05-06 NOTE — Evaluation (Signed)
Occupational Therapy Evaluation Patient Details Name: Vicki [name] MRN: [id number] DOB: [birth year] Today's Date: 05/06/2020    History of Present Illness presented to ER secondary to lethargy, decreased responsiveness; admitted for management of DKA, acute metabolic encephalopathy   Clinical Impression   Patient presenting with decreased I in self care, balance, functional mobility/transfers, endurance, and safety awareness. Patient reports being mod I PTA. Pt greets therapist with, " I think I have had a stroke". Pt reports numbness in L hand and heavyness in L LE. Pt requiring mod A for sit <>stand and functional transfer with difficulty with weight shifting and sequencing of tasks. Pt able to utilize L UE in a functional way but requiring multiple attempts secondary to decreased coordination. RN notified. Patient will benefit from acute OT to increase overall independence in the areas of ADLs, functional mobility, and safety awareness in order to safely discharge to next venue of care.    Follow Up Recommendations  SNF    Equipment Recommendations  Other (comment) (defer to next venue of care)    Recommendations for Other Services Other (comment) (none at this time)     Precautions / Restrictions Precautions Precautions: Fall Restrictions Weight Bearing Restrictions: No      Mobility Bed Mobility Overal bed mobility: Needs Assistance Bed Mobility: Supine to Sit     Supine to sit: Supervision     General bed mobility comments: pt seated in recliner chair upon entering the room  Transfers Overall transfer level: Needs assistance Equipment used: Rolling walker (2 wheeled) Transfers: Sit to/from Stand Sit to Stand: Mod assist         General transfer comment: step-by-step cuing for hand and foot placement; extensive assist for lift off and initial standing balance (tendancy towards posterior LOB); heavy use of bilat UEs to complete    Balance Overall balance  assessment: Needs assistance Sitting-balance support: No upper extremity supported;Feet supported Sitting balance-Leahy Scale: Good     Standing balance support: Bilateral upper extremity supported Standing balance-Leahy Scale: Poor       ADL either performed or assessed with clinical judgement   ADL Overall ADL's : Needs assistance/impaired       General ADL Comments: Pt notes to have decreased FMC in L hand with self care tasks this session. Hand is still functional but requiring increased time and additional attempts with self care tasks. Functional transfer with mod A this session.     Vision Baseline Vision/History: No visual deficits Patient Visual Report: No change from baseline              Pertinent Vitals/Pain Pain Assessment: Faces Faces Pain Scale: Hurts a little bit Pain Location: Chronic R LE and hip pain/soreness Pain Descriptors / Indicators: Sore Pain Intervention(s): Limited activity within patient's tolerance;Monitored during session;Repositioned     Hand Dominance Right   Extremity/Trunk Assessment Upper Extremity Assessment Upper Extremity Assessment: Generalized weakness (Pt reports numbness in L hand)   Lower Extremity Assessment Lower Extremity Assessment: Generalized weakness       Communication Communication Communication: No difficulties   Cognition Arousal/Alertness: Awake/alert Behavior During Therapy: WFL for tasks assessed/performed Overall Cognitive Status: History of cognitive impairments - at baseline        General Comments: oriented to self, location and general situation; mild confusion evident at times, limited insight/awareness, limited recall of new information   General Comments       Exercises Other Exercises Other Exercises: Sit/stand from edge of bed, recliner, min/mod assist for lift  off and standing balance; poor recall/carry-over of education for hand/foot placement and overall transfer mechanics Other  Exercises: Bed/chair transfer with RW, min/mod assist-decreased safety awareness when approaching seating surface, tends to release grasp of RW, mod assist to maintain balance   Shoulder Instructions      Home Living Family/patient expects to be discharged to:: Private residence Living Arrangements: Other relatives Available Help at Discharge: Family;Available PRN/intermittently Type of Home: House Home Access: Stairs to enter [employer] of Steps: 3 Entrance Stairs-Rails: Right;Left Home Layout: One level     Bathroom Shower/Tub: Producer, television/film/video: Standard Bathroom Accessibility: Yes How Accessible: Accessible via walker Home Equipment: Cane - single point;Shower seat;Grab bars - tub/shower;Hand held Careers information officer - 2 wheels          Prior Functioning/Environment Level of Independence: Independent with assistive device(s);Needs assistance  Gait / Transfers Assistance Needed: Pt reports that she is able to get around with [facility] (walking to mailbox, light errands when others drive, etc) ADL's / Homemaking Assistance Needed: minimal assist from cousin, reports that she does most of her ADLs, etc on her own   Comments: Ambulatory with RW for household and limited community mobilization; does endorse at least 1-2 falls in previous 6 months, noted decline in functional ability in recent weeks/months        OT Problem List: Decreased strength;Decreased coordination;Decreased activity tolerance;Decreased safety awareness;Decreased knowledge of use of DME or AE;Impaired balance (sitting and/or standing);Decreased knowledge of precautions;Impaired UE functional use      OT Treatment/Interventions: Self-care/ADL training;Therapeutic exercise;Therapeutic activities;Energy conservation;Patient/family education;DME and/or AE instruction;Balance training;Neuromuscular education    OT Goals(Current goals can be found in the care plan section) Acute Rehab OT  Goals Patient Stated Goal: to stay as independent as i can OT Goal Formulation: With patient Time For Goal Achievement: 05/20/20 Potential to Achieve Goals: Good ADL Goals Pt Will Perform Grooming: with supervision;standing Pt Will Transfer to Toilet: with supervision;ambulating Pt Will Perform Toileting - Clothing Manipulation and hygiene: with supervision;sit to/from stand  OT Frequency: Min 1X/week    AM-PAC OT "6 Clicks" Daily Activity     Outcome Measure Help from another person eating meals?: A Little Help from another person taking care of personal grooming?: A Little Help from another person toileting, which includes using toliet, bedpan, or urinal?: A Lot Help from another person bathing (including washing, rinsing, drying)?: A Lot Help from another person to put on and taking off regular upper body clothing?: A Little Help from another person to put on and taking off regular lower body clothing?: A Lot 6 Click Score: 15   End of Session Nurse Communication: Mobility status;Other (comment) (L UE numbness and L LE heavyness and pt reports,. " I think I had a stroke")  Activity Tolerance: Patient tolerated treatment well Patient left: in chair;with call bell/phone within reach;with chair alarm set  OT Visit Diagnosis: Muscle weakness (generalized) (M62.81)                Time: 1102-1117 OT Time Calculation (min): 14 min Charges:  OT General Charges $OT Visit: 1 Visit OT Evaluation $OT Eval Moderate Complexity: 1 [address] [place], OTR/L , [facility]IS ascom [phone]  05/06/20, 1:02 PM

## 2020-05-06 NOTE — Consult Note (Signed)
PHARMACY CONSULT NOTE - FOLLOW UP  Pharmacy Consult for Electrolyte Monitoring and Replacement   Recent Labs: Potassium (mmol/L)  Date Value  05/06/2020 3.7  04/09/2012 4.0   Magnesium (mg/dL)  Date Value  94/50/3888 1.8  04/05/2012 2.4   Calcium (mg/dL)  Date Value  28/00/3491 7.9 (L)   Calcium, Total (mg/dL)  Date Value  79/15/0569 8.8   Albumin (g/dL)  Date Value  79/48/0165 3.3 (L)  02/17/2012 3.2 (L)   Phosphorus (mg/dL)  Date Value  53/74/8270 3.1   Sodium (mmol/L)  Date Value  05/06/2020 139  04/09/2012 142   Assessment: 79 y.o. female with a past medical history of diabetes, gastric reflux, hypertension, prior MI and CVA presents to the emergency department for decreased responsiveness and elevated blood glucose.   Pharmacy has been consulted to monitor and replenish electrolytes.  K 3.7, s/p replenishment with oral therapy; patient previously refused IV replacement.  Mg, 1.8.  Will likely replenish with oral therapy so avoid patient's refusal,  Goal of Therapy:   Electrolytes WNL  Plan:   Will order Mg 400 mg BID x2 doses and recheck electrolytes tomorrow with AM labs.  Katha Cabal 05/06/2020 7:31 AM

## 2020-05-06 NOTE — Progress Notes (Signed)
PROGRESS NOTE    Vicki [name]  ZHG:[id number] DOB: 15-Dec-1940 DOA: 05/03/2020 PCP: Lynnea Ferrier, MD    Brief Narrative:  Pt admitted for DKA. Was admitted to ICU now transferred to the medical floor    Consultants:   pccm  Procedures:   Antimicrobials:   Azithro, zosyn   Subjective:  Reports feels Lt hand and arm are weak. No complaints of numbness to me but apparently complained to OT.     Objective: Vitals:   05/06/20 0017 05/06/20 0540 05/06/20 0738 05/06/20 0740  BP: 132/80  (!) 154/67   Pulse: 84  73 83  Resp: 17  16 16   Temp: 98.6 F (37 C)  98.2 F (36.8 C)   TempSrc: Oral  Oral   SpO2: 95%  96% 94%  Weight:  68 kg    Height:        Intake/Output Summary (Last 24 hours) at 05/06/2020 1453 Last data filed at 05/06/2020 1426 Gross per 24 hour  Intake 600 ml  Output 700 ml  Net -100 ml   Filed Weights   05/05/20 0300 05/06/20 0540  Weight: 66 kg 68 kg    Examination: Comfortable, NAD Neuro exam, alert oriented.  No facial droop.strenght x4 5/5. No other neurologic deficits noted.  cta no r/r/w RRR s1/s2 no m/r/g Soft benign +bs No edema bilaterally Mood and affect appropriate     Data Reviewed: I have personally reviewed following labs and imaging studies  CBC: Recent Labs  Lab 05/03/20 1021 05/04/20 0334 05/05/20 0955 05/06/20 0441  WBC 26.2* 24.0* 17.3* 9.4  HGB 10.6* 9.9* 9.9* 9.0*  HCT 36.8 29.7* 29.9* 26.8*  MCV 108.6* 94.3 94.3 94.0  PLT 343 288 269 254   Basic Metabolic Panel: Recent Labs  Lab 05/03/20 1021 05/03/20 1021 05/03/20 1413 05/03/20 1413 05/03/20 2048 05/04/20 0334 05/04/20 1304 05/05/20 0437 05/06/20 0441  NA 134*   < > 137  --  141 145  --  143 139  K 6.0*   < > 4.7   < > 2.9* 4.5 3.5 3.2* 3.7  CL 96*   < > 97*  --  111 102  --  96* 101  CO2 <7*   < > <7*  --  12* 29  --  32 30  GLUCOSE 841*   < > 720*  --  252* 155*  --  178* 153*  BUN 30*   < > 31*  --  21 27*  --  20 12  CREATININE  1.90*   < > 2.02*  --  1.11* 1.39*  --  0.96 0.85  CALCIUM 8.8*   < > 8.0*  --  6.1* 7.9*  --  7.5* 7.9*  MG 2.3  --   --   --   --  1.7  --  1.9 1.8  PHOS 9.2*  --   --   --   --  3.1  --  3.1  --    < > = values in this interval not displayed.   GFR: Estimated Creatinine Clearance: 48 mL/min (by C-G formula based on SCr of 0.85 mg/dL). Liver Function Tests: Recent Labs  Lab 05/03/20 1021  AST 42*  ALT 55*  ALKPHOS 80  BILITOT 2.0*  PROT 6.4*  ALBUMIN 3.3*   Recent Labs  Lab 05/03/20 1021  LIPASE 27   No results for input(s): AMMONIA in the last 168 hours. Coagulation Profile: Recent Labs  Lab 05/03/20 1021  INR 1.1   Cardiac Enzymes: No results for input(s): CKTOTAL, CKMB, CKMBINDEX, TROPONINI in the last 168 hours. BNP (last 3 results) No results for input(s): PROBNP in the last 8760 hours. HbA1C: No results for input(s): HGBA1C in the last 72 hours. CBG: Recent Labs  Lab 05/06/20 0022 05/06/20 0318 05/06/20 0401 05/06/20 0739 05/06/20 1133  GLUCAP 83 41* 158* 141* 81   Lipid Profile: No results for input(s): CHOL, HDL, LDLCALC, TRIG, CHOLHDL, LDLDIRECT in the last 72 hours. Thyroid Function Tests: No results for input(s): TSH, T4TOTAL, FREET4, T3FREE, THYROIDAB in the last 72 hours. Anemia Panel: No results for input(s): VITAMINB12, FOLATE, FERRITIN, TIBC, IRON, RETICCTPCT in the last 72 hours. Sepsis Labs: Recent Labs  Lab 05/03/20 1021  PROCALCITON 0.41    Recent Results (from the past 240 hour(s))  Respiratory Panel by RT PCR (Flu A&B, Covid) - Nasopharyngeal Swab     Status: None   Collection Time: 05/03/20 10:21 AM   Specimen: Nasopharyngeal Swab  Result Value Ref Range Status   SARS Coronavirus 2 by RT PCR NEGATIVE NEGATIVE Final    Comment: (NOTE) SARS-CoV-2 target nucleic acids are NOT DETECTED.  The SARS-CoV-2 RNA is generally detectable in upper respiratoy specimens during the acute phase of infection. The lowest concentration of  SARS-CoV-2 viral copies this assay can detect is 131 copies/mL. A negative result does not preclude SARS-Cov-2 infection and should not be used as the sole basis for treatment or other patient management decisions. A negative result may occur with  improper specimen collection/handling, submission of specimen other than nasopharyngeal swab, presence of viral mutation(s) within the areas targeted by this assay, and inadequate number of viral copies (<131 copies/mL). A negative result must be combined with clinical observations, patient history, and epidemiological information. The expected result is Negative.  Fact Sheet for Patients:  https://www.moore.com/https://www.fda.gov/media/142436/download  Fact Sheet for Healthcare Providers:  https://www.young.biz/https://www.fda.gov/media/142435/download  This test is no t yet approved or cleared by the [place] FDA and  has been authorized for detection and/or diagnosis of SARS-CoV-2 by FDA under an Emergency Use Authorization (EUA). This EUA will remain  in effect (meaning this test can be used) for the duration of the COVID-19 declaration under Section 564(b)(1) of the Act, 21 U.S.C. section 360bbb-3(b)(1), unless the authorization is terminated or revoked sooner.     Influenza A by PCR NEGATIVE NEGATIVE Final   Influenza B by PCR NEGATIVE NEGATIVE Final    Comment: (NOTE) The Xpert Xpress SARS-CoV-2/FLU/RSV assay is intended as an aid in  the diagnosis of influenza from Nasopharyngeal swab specimens and  should not be used as a sole basis for treatment. Nasal washings and  aspirates are unacceptable for Xpert Xpress SARS-CoV-2/FLU/RSV  testing.  Fact Sheet for Patients: https://www.moore.com/https://www.fda.gov/media/142436/download  Fact Sheet for Healthcare Providers: https://www.young.biz/https://www.fda.gov/media/142435/download  This test is not yet approved or cleared by the [place] FDA and  has been authorized for detection and/or diagnosis of SARS-CoV-2 by  FDA under an Emergency Use  Authorization (EUA). This EUA will remain  in effect (meaning this test can be used) for the duration of the  Covid-19 declaration under Section 564(b)(1) of the Act, 21  U.S.C. section 360bbb-3(b)(1), unless the authorization is  terminated or revoked. Performed at [facility], [address] [place], [place] [address]   Urine Culture     Status: None   Collection Time: 05/03/20 10:21 AM   Specimen: Urine, Catheterized  Result Value Ref Range Status   Specimen Description  Final    URINE, CATHETERIZED Performed at [facility], [address] [place], [place] [address]    Special Requests   Final    NONE Performed at [facility], [address] [place], [place] [address]    Culture   Final    NO GROWTH Performed at [facility], [address], [place], [place] [address]    Report Status 05/04/2020 FINAL  Final  Culture, blood (routine x 2)     Status: None (Preliminary result)   Collection Time: 05/03/20  2:13 PM   Specimen: BLOOD  Result Value Ref Range Status   Specimen Description BLOOD LEFT ANTECUBITAL  Final   Special Requests   Final    BOTTLES DRAWN AEROBIC AND ANAEROBIC Blood Culture adequate volume   Culture   Final    NO GROWTH 3 DAYS Performed at [facility], [address] [place], [place] [address]    Report Status PENDING  Incomplete  Culture, blood (routine x 2)     Status: None (Preliminary result)   Collection Time: 05/03/20  2:13 PM   Specimen: BLOOD  Result Value Ref Range Status   Specimen Description BLOOD BLOOD LEFT HAND  Final   Special Requests   Final    BOTTLES DRAWN AEROBIC AND ANAEROBIC Blood Culture results may not be optimal due to an inadequate volume of blood received in culture bottles   Culture   Final    NO GROWTH 3 DAYS Performed at [facility], [address] [place], [place] [address]    Report Status PENDING  Incomplete         Radiology Studies: US Venous  Img Lower Bilateral (DVT)  Result Date: 05/05/2020 CLINICAL DATA:  Bilateral lower extremity pain and edema. EXAM: BILATERAL LOWER EXTREMITY VENOUS DOPPLER ULTRASOUND TECHNIQUE: Gray-scale sonography with graded compression, as well as color Doppler and duplex ultrasound were performed to evaluate the lower extremity deep venous systems from the level of the common femoral vein and including the common femoral, femoral, profunda femoral, popliteal and calf veins including the posterior tibial, peroneal and gastrocnemius veins when visible. The superficial great saphenous vein was also interrogated. Spectral Doppler was utilized to evaluate flow at rest and with distal augmentation maneuvers in the common femoral, femoral and popliteal veins. COMPARISON:  None. FINDINGS: RIGHT LOWER EXTREMITY Common Femoral Vein: No evidence of thrombus. Normal compressibility, respiratory phasicity and response to augmentation. Saphenofemoral Junction: No evidence of thrombus. Normal compressibility and flow on color Doppler imaging. Profunda Femoral Vein: No evidence of thrombus. Normal compressibility and flow on color Doppler imaging. Femoral Vein: No evidence of thrombus. Normal compressibility, respiratory phasicity and response to augmentation. Popliteal Vein: No evidence of thrombus. Normal compressibility, respiratory phasicity and response to augmentation. Calf Veins: No evidence of thrombus. Normal compressibility and flow on color Doppler imaging. Superficial Great Saphenous Vein: No evidence of thrombus. Normal compressibility. Venous Reflux:  None. Other Findings: No evidence of superficial thrombophlebitis or abnormal fluid collection. LEFT LOWER EXTREMITY Common Femoral Vein: No evidence of thrombus. Normal compressibility, respiratory phasicity and response to augmentation. Saphenofemoral Junction: No evidence of thrombus. Normal compressibility and flow on color Doppler imaging. Profunda Femoral Vein: No evidence  of thrombus. Normal compressibility and flow on color Doppler imaging. Femoral Vein: No evidence of thrombus. Normal compressibility, respiratory phasicity and response to augmentation. Popliteal Vein: No evidence of thrombus. Normal compressibility, respiratory phasicity and response to augmentation. Calf Veins: No evidence of thrombus. Normal compressibility  and flow on color Doppler imaging. Superficial Great Saphenous Vein: No evidence of thrombus. Normal compressibility. Venous Reflux:  None. Other Findings: No evidence of superficial thrombophlebitis or abnormal fluid collection. IMPRESSION: No evidence of deep venous thrombosis in either lower extremity. Electronically Signed   By: Irish Lack M.D.   On: 05/05/2020 16:46        Scheduled Meds: . enoxaparin (LOVENOX) injection  40 mg Subcutaneous Q24H  . insulin aspart  0-9 Units Subcutaneous Q4H  . insulin aspart  5 Units Subcutaneous TID WC  . insulin glargine  20 Units Subcutaneous Daily  . ipratropium-albuterol  3 mL Nebulization BID   Continuous Infusions: . sodium chloride Stopped (05/05/20 0531)  . azithromycin 500 mg (05/06/20 1439)  . famotidine (PEPCID) IV 20 mg (05/05/20 1057)  . lactated ringers 75 mL/hr at 05/05/20 1207  . piperacillin-tazobactam (ZOSYN)  IV 3.375 g (05/06/20 0719)  .  sodium bicarbonate (isotonic) infusion in sterile water Stopped (05/04/20 1931)    Assessment & Plan:   Active Problems:   Encephalopathy   AMS with encephalopathy- on admisstion. Due to toxic metabolic encph in context of severe acidemia dn DKA Improved at at baseline  Blood cultures to date negative  Covid negative  We will continue with IV antibiotics till final results of culture come back    DKA-2/2 to noncompliance as patient forgets to take her medications Was treated in ICU with DKA protocal , off insulin gtt.  Blood glucose levels on lower side, early this a.m. 41.  Decrease Lantus to 18 units from 20 Continue to  monitor closely    Hyponatremia-due to elevated BG  improved with IV fluids and correction of her glucose levels  Remained stable, sodium 139 today    Hypokalemia-  Was supplemented and now K is stable at 3.7 Continue to monitor  ?LLE/LUE-per patient.  On exam she has no deficits.  But will obtain a CT of the head to make sure no acute intracranial issues has occurred.  LE b/l pain-venous Doppler was negative for DVT bilateral   Medical noncompliance- pt's sister stated pt unable to go back to [facility] as they live to[place]ter and pt not taking her meds as she forgets. With multiple admissions .  10/2-PT/OT recommends SNF Palliative care consulted and pending   DVT prophylaxis: lovenox Code Status:full Family Communication: sister at bedside  Status is: Inpatient  Remains inpatient appropriate because:Inpatient level of care appropriate due to severity of illness   Dispo: The patient is from: Home              Anticipated d/c is to: SNF              Anticipated d/c date is: 1 day              Patient currently not medically stable. C/o Lt sided weakness, CT head pending.             LOS: 3 days   Time spent: 45 min with >50 on coc    Lynn Ito, MD [facility] Pager [phone]  If 7PM-7AM, please contact night-coverage [email or web address] Password [facility] 05/06/2020, 2:53 PM

## 2020-05-06 NOTE — Progress Notes (Signed)
Patient BS rechecked 30 minutes after 1 Amp of 50% dextrose was administered. Patients BS now 154. MD and NP notified, currently awaiting response.

## 2020-05-07 ENCOUNTER — Inpatient Hospital Stay: Payer: Medicare Other

## 2020-05-07 DIAGNOSIS — R29898 Other symptoms and signs involving the musculoskeletal system: Secondary | ICD-10-CM | POA: Diagnosis present

## 2020-05-07 DIAGNOSIS — G9340 Encephalopathy, unspecified: Secondary | ICD-10-CM

## 2020-05-07 LAB — BASIC METABOLIC PANEL
Anion gap: 7 (ref 5–15)
BUN: 6 mg/dL — ABNORMAL LOW (ref 8–23)
CO2: 27 mmol/L (ref 22–32)
Calcium: 8.6 mg/dL — ABNORMAL LOW (ref 8.9–10.3)
Chloride: 106 mmol/L (ref 98–111)
Creatinine, Ser: 0.66 mg/dL (ref 0.44–1.00)
GFR calc Af Amer: >60 mL/min (ref >60)
GFR calc non Af Amer: >60 mL/min (ref >60)
Glucose, Bld: 112 mg/dL — ABNORMAL HIGH (ref 70–99)
Potassium: 3.7 mmol/L (ref 3.5–5.1)
Sodium: 140 mmol/L (ref 135–145)

## 2020-05-07 LAB — GLUCOSE, CAPILLARY
Glucose-Capillary: 103 mg/dL — ABNORMAL HIGH (ref 70–99)
Glucose-Capillary: 105 mg/dL — ABNORMAL HIGH (ref 70–99)
Glucose-Capillary: 121 mg/dL — ABNORMAL HIGH (ref 70–99)
Glucose-Capillary: 129 mg/dL — ABNORMAL HIGH (ref 70–99)
Glucose-Capillary: 130 mg/dL — ABNORMAL HIGH (ref 70–99)
Glucose-Capillary: 185 mg/dL — ABNORMAL HIGH (ref 70–99)
Glucose-Capillary: 185 mg/dL — ABNORMAL HIGH (ref 70–99)
Glucose-Capillary: 210 mg/dL — ABNORMAL HIGH (ref 70–99)
Glucose-Capillary: 45 mg/dL — ABNORMAL LOW (ref 70–99)
Glucose-Capillary: 46 mg/dL — ABNORMAL LOW (ref 70–99)

## 2020-05-07 LAB — MAGNESIUM: Magnesium: 1.6 mg/dL — ABNORMAL LOW (ref 1.7–2.4)

## 2020-05-07 MED ORDER — ASPIRIN EC 81 MG PO TBEC
81.0000 mg | DELAYED_RELEASE_TABLET | Freq: Every day | ORAL | Status: DC
  Administered 2020-05-07 – 2020-05-09 (×3): 81 mg via ORAL
  Filled 2020-05-07 (×3): qty 1

## 2020-05-07 MED ORDER — DEXTROSE-NACL 5-0.9 % IV SOLN: INTRAVENOUS | Status: DC

## 2020-05-07 MED ORDER — MAGNESIUM SULFATE 2 GM/50ML IV SOLN
2.0000 g | Freq: Once | INTRAVENOUS | Status: AC
  Administered 2020-05-07: 2 g via INTRAVENOUS
  Filled 2020-05-07: qty 50

## 2020-05-07 MED ORDER — IPRATROPIUM-ALBUTEROL 0.5-2.5 (3) MG/3ML IN SOLN: 3.0000 mL | RESPIRATORY_TRACT | Status: DC | PRN

## 2020-05-07 NOTE — Consult Note (Signed)
PHARMACY CONSULT NOTE - FOLLOW UP  Pharmacy Consult for Electrolyte Monitoring and Replacement   Recent Labs: Potassium (mmol/L)  Date Value  05/07/2020 3.7  04/09/2012 4.0   Magnesium (mg/dL)  Date Value  88/41/6606 1.6 (L)  04/05/2012 2.4   Calcium (mg/dL)  Date Value  30/16/0109 8.6 (L)   Calcium, Total (mg/dL)  Date Value  32/35/5732 8.8   Albumin (g/dL)  Date Value  20/25/4270 3.3 (L)  02/17/2012 3.2 (L)   Phosphorus (mg/dL)  Date Value  62/37/6283 3.1   Sodium (mmol/L)  Date Value  05/07/2020 140  04/09/2012 142   Assessment: 79 y.o. female with a past medical history of diabetes, gastric reflux, hypertension, prior MI and CVA presents to the emergency department for decreased responsiveness and elevated blood glucose.   Pharmacy has been consulted to monitor and replenish electrolytes.  K 3.7, s/p replenishment with oral therapy; patient previously refused IV replacement.  Mg, 1.8.  Will likely replenish with oral therapy so avoid patient's refusal,  Goal of Therapy:   Electrolytes WNL  Plan:   K 3.7  Mag 1.6  Scr 0.66.  Will order Mag sulfate 2 gm IV x 1.  recheck electrolytes tomorrow with AM labs.  Ghada Abbett A 05/07/2020 8:46 AM

## 2020-05-07 NOTE — Progress Notes (Addendum)
   05/07/20 1500  Clinical Encounter Type  Visited With Patient  Visit Type Follow-up  Referral From Chaplain  Consult/Referral To Chaplain  Pt's sister told chaplain that Pt ws back in the hospital. Chaplain stopped by to check on Pt. Chaplain asked if she felt better than she did when she came in. Pt said she felt fine when she came in, but not feeling so well today. Chaplain asked to pray with her. Chaplain prayed and said she was cold, therefore, chaplain pulled blankets up over Pt. Chaplain will check on Pt later.

## 2020-05-07 NOTE — Progress Notes (Addendum)
PROGRESS NOTE    Vicki [name]  ZOX:[id number] DOB: 10-17-1940 DOA: 05/03/2020 PCP: Lynnea Ferrier, MD    Brief Narrative:  Pt admitted for DKA. Was admitted to ICU now transferred to the medical floor On 10/2 c/o Lt sided weakness of arm and leg, ct head completed.  10/3-states LUE weakness is better, but LLE has pain on sole of foot, which sounds more like plantar fascitis. Unable to tell me if LLE is weak Had multiple episode of hypoglycemia yesterday afternoon, all insulin was discontinued except RISS  Consultants:     Procedures:  CT head 1. Positive for new hypodensity in the right basal ganglia since the CT on 04/19/2020, compatible with acute to subacute lacunar infarct in this setting. No associated hemorrhage or mass effect.  2. Elsewhere stable non contrast CT appearance of the brain.    Antimicrobials:       Subjective: states LUE weakness is better, but LLE has pain on sole of foot, which sounds more like plantar fascitis. Unable to tell me if LLE is weak   Objective: Vitals:   05/07/20 0733 05/07/20 0819 05/07/20 1622 05/07/20 1628  BP:  (!) 179/71 (!) 186/83 130/78  Pulse: 75 84 79   Resp: Temp:  98.5 F (36.9 C) 98.4 F (36.9 C)   TempSrc:  Oral Oral   SpO2: 96% 100% 100%   Weight:      Height:        Intake/Output Summary (Last 24 hours) at 05/07/2020 1724 Last data filed at 05/07/2020 1546 Gross per 24 hour  Intake 2040.69 ml  Output 0 ml  Net 2040.69 ml   Filed Weights   05/05/20 0300 05/06/20 0540 05/07/20 0636  Weight: 66 kg 68 kg 68.6 kg    Examination:  General exam: Appears calm and comfortable  Respiratory system: Clear to auscultation. Respiratory effort normal. Cardiovascular system: S1 & S2 heard, RRR. No JVD, murmurs, rubs, gallops or clicks. Gastrointestinal system: Abdomen is nondistended, soft and nontender. . Normal bowel sounds heard. Central nervous system: Alert and oriented no facial drop or  other deficits.B/L UE 5/5, RLE 5/5, LLE 4/5 Extremities: No edema Skin: Warm dry Psychiatry:  Mood & affect appropriate.     Data Reviewed: I have personally reviewed following labs and imaging studies  CBC: Recent Labs  Lab 05/03/20 1021 05/04/20 0334 05/05/20 0955 05/06/20 0441  WBC 26.2* 24.0* 17.3* 9.4  HGB 10.6* 9.9* 9.9* 9.0*  HCT 36.8 29.7* 29.9* 26.8*  MCV 108.6* 94.3 94.3 94.0  PLT 343 288 269 254   Basic Metabolic Panel: Recent Labs  Lab 05/03/20 1021 05/03/20 1413 05/03/20 2048 05/03/20 2048 05/04/20 0334 05/04/20 1304 05/05/20 0437 05/06/20 0441 05/07/20 0628  NA 134*   < > 141  --  145  --  143 139 140  K 6.0*   < > 2.9*   < > 4.5 3.5 3.2* 3.7 3.7  CL 96*   < > 111  --  102  --  96* 101 106  CO2 <7*   < > 12*  --  29  --  32 30 27  GLUCOSE 841*   < > 252*  --  155*  --  178* 153* 112*  BUN 30*   < > 21  --  27*  --  20 12 6*  CREATININE 1.90*   < > 1.11*  --  1.39*  --  0.96 0.85 0.66  CALCIUM 8.8*   < >  6.1*  --  7.9*  --  7.5* 7.9* 8.6*  MG 2.3  --   --   --  1.7  --  1.9 1.8 1.6*  PHOS 9.2*  --   --   --  3.1  --  3.1  --   --    < > = values in this interval not displayed.   GFR: Estimated Creatinine Clearance: 51.3 mL/min (by C-G formula based on SCr of 0.66 mg/dL). Liver Function Tests: Recent Labs  Lab 05/03/20 1021  AST 42*  ALT 55*  ALKPHOS 80  BILITOT 2.0*  PROT 6.4*  ALBUMIN 3.3*   Recent Labs  Lab 05/03/20 1021  LIPASE 27   No results for input(s): AMMONIA in the last 168 hours. Coagulation Profile: Recent Labs  Lab 05/03/20 1021  INR 1.1   Cardiac Enzymes: No results for input(s): CKTOTAL, CKMB, CKMBINDEX, TROPONINI in the last 168 hours. BNP (last 3 results) No results for input(s): PROBNP in the last 8760 hours. HbA1C: No results for input(s): HGBA1C in the last 72 hours. CBG: Recent Labs  Lab 05/07/20 0408 05/07/20 0630 05/07/20 0821 05/07/20 1246 05/07/20 1633  GLUCAP 105* 103* 121* 210* 185*   Lipid  Profile: No results for input(s): CHOL, HDL, LDLCALC, TRIG, CHOLHDL, LDLDIRECT in the last 72 hours. Thyroid Function Tests: No results for input(s): TSH, T4TOTAL, FREET4, T3FREE, THYROIDAB in the last 72 hours. Anemia Panel: No results for input(s): VITAMINB12, FOLATE, FERRITIN, TIBC, IRON, RETICCTPCT in the last 72 hours. Sepsis Labs: Recent Labs  Lab 05/03/20 1021  PROCALCITON 0.41    Recent Results (from the past 240 hour(s))  Respiratory Panel by RT PCR (Flu A&B, Covid) - Nasopharyngeal Swab     Status: None   Collection Time: 05/03/20 10:21 AM   Specimen: Nasopharyngeal Swab  Result Value Ref Range Status   SARS Coronavirus 2 by RT PCR NEGATIVE NEGATIVE Final    Comment: (NOTE) SARS-CoV-2 target nucleic acids are NOT DETECTED.  The SARS-CoV-2 RNA is generally detectable in upper respiratoy specimens during the acute phase of infection. The lowest concentration of SARS-CoV-2 viral copies this assay can detect is 131 copies/mL. A negative result does not preclude SARS-Cov-2 infection and should not be used as the sole basis for treatment or other patient management decisions. A negative result may occur with  improper specimen collection/handling, submission of specimen other than nasopharyngeal swab, presence of viral mutation(s) within the areas targeted by this assay, and inadequate number of viral copies (<131 copies/mL). A negative result must be combined with clinical observations, patient history, and epidemiological information. The expected result is Negative.  Fact Sheet for Patients:  https://www.moore.com/  Fact Sheet for Healthcare Providers:  https://www.young.biz/  This test is no t yet approved or cleared by the [place] FDA and  has been authorized for detection and/or diagnosis of SARS-CoV-2 by FDA under an Emergency Use Authorization (EUA). This EUA will remain  in effect (meaning this test can be used) for  the duration of the COVID-19 declaration under Section 564(b)(1) of the Act, 21 U.S.C. section 360bbb-3(b)(1), unless the authorization is terminated or revoked sooner.     Influenza A by PCR NEGATIVE NEGATIVE Final   Influenza B by PCR NEGATIVE NEGATIVE Final    Comment: (NOTE) The Xpert Xpress SARS-CoV-2/FLU/RSV assay is intended as an aid in  the diagnosis of influenza from Nasopharyngeal swab specimens and  should not be used as a sole basis for treatment. Nasal washings and  aspirates  are unacceptable for Xpert Xpress SARS-CoV-2/FLU/RSV  testing.  Fact Sheet for Patients: https://www.moore.com/  Fact Sheet for Healthcare Providers: https://www.young.biz/  This test is not yet approved or cleared by the [place] FDA and  has been authorized for detection and/or diagnosis of SARS-CoV-2 by  FDA under an Emergency Use Authorization (EUA). This EUA will remain  in effect (meaning this test can be used) for the duration of the  Covid-19 declaration under Section 564(b)(1) of the Act, 21  U.S.C. section 360bbb-3(b)(1), unless the authorization is  terminated or revoked. Performed at [facility], [address] [place], [place] [address]   Urine Culture     Status: None   Collection Time: 05/03/20 10:21 AM   Specimen: Urine, Catheterized  Result Value Ref Range Status   Specimen Description   Final    URINE, CATHETERIZED Performed at [facility], [address] [place], [place] [address]    Special Requests   Final    NONE Performed at [facility], [address], [place], [place] [address]    Culture   Final    NO GROWTH Performed at [facility], [address], [place], [place] [address]    Report Status 05/04/2020 FINAL  Final  Culture, blood (routine x 2)     Status: None (Preliminary result)   Collection Time: 05/03/20  2:13 PM   Specimen: BLOOD  Result Value Ref Range Status    Specimen Description BLOOD LEFT ANTECUBITAL  Final   Special Requests   Final    BOTTLES DRAWN AEROBIC AND ANAEROBIC Blood Culture adequate volume   Culture   Final    NO GROWTH 4 DAYS Performed at [facility], [address] [place], [place] [address]    Report Status PENDING  Incomplete  Culture, blood (routine x 2)     Status: None (Preliminary result)   Collection Time: 05/03/20  2:13 PM   Specimen: BLOOD  Result Value Ref Range Status   Specimen Description BLOOD BLOOD LEFT HAND  Final   Special Requests   Final    BOTTLES DRAWN AEROBIC AND ANAEROBIC Blood Culture results may not be optimal due to an inadequate volume of blood received in culture bottles   Culture   Final    NO GROWTH 4 DAYS Performed at [facility], [address] [place], [place] [address]    Report Status PENDING  Incomplete         Radiology Studies: CT HEAD WO CONTRAST  Result Date: 05/06/2020 CLINICAL DATA:  79 year old female with altered mental status last month. Patient reports left hand and leg weakness beginning a few days ago. EXAM: CT HEAD WITHOUT CONTRAST TECHNIQUE: Contiguous axial images were obtained from the base of the skull through the vertex without intravenous contrast. COMPARISON:  Head CT 04/19/2020 and earlier.  Brain MRI 09/12/2018. FINDINGS: Brain: No midline shift, mass effect, or evidence of intracranial mass lesion. No ventriculomegaly. Stable cerebral volume. No acute intracranial hemorrhage identified. Largely stable gray-white matter differentiation throughout the brain. Patchy and confluent bilateral white matter hypodensity. However, there is new hypodensity in the right basal ganglia since 04/19/2020 on series 2, image 11. Other deep gray nuclei appears stable. No superimposed acute cortically based infarct identified. Vascular: Calcified atherosclerosis at the skull base. No suspicious intracranial vascular hyperdensity. Skull: No acute osseous abnormality  identified. Sinuses/Orbits: Visualized paranasal sinuses and mastoids are stable and well pneumatized. Other: Visualized orbits and scalp soft tissues are  stable and negative. IMPRESSION: 1. Positive for new hypodensity in the right basal ganglia since the CT on 04/19/2020, compatible with acute to subacute lacunar infarct in this setting. No associated hemorrhage or mass effect. 2. Elsewhere stable non contrast CT appearance of the brain. Electronically Signed   By: Odessa Fleming M.D.   On: 05/06/2020 16:15   MR BRAIN WO CONTRAST  Result Date: 05/07/2020 CLINICAL DATA:  Neuro deficit, acute stroke suspected. EXAM: MRI HEAD WITHOUT CONTRAST TECHNIQUE: Multiplanar, multiecho pulse sequences of the brain and surrounding structures were obtained without intravenous contrast. COMPARISON:  CT head 05/06/2020 FINDINGS: Brain: No evidence of acute infarct. No acute hemorrhage. Mild to moderate generalized cerebral volume loss with ex vacuo ventricular dilation. No hydrocephalus. T2/FLAIR hyperintensity within the white matter and bilateral basal ganglia, compatible with chronic microvascular ischemic disease. Vascular: Proximal arterial flow voids are maintained at the skull base. Skull and upper cervical spine: Normal marrow signal. Sinuses/Orbits: Mild ethmoid air cell and frontal sinus mucosal thickening. No air-fluid levels. Other: None IMPRESSION: 1. No acute intracranial abnormality. Specifically, no acute infarct. 2. 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 Electronically Signed   By: Feliberto Harts MD   On: 05/07/2020 14:40        Scheduled Meds: . aspirin EC  81 mg Oral Daily  . enoxaparin (LOVENOX) injection  40 mg Subcutaneous Q24H  . insulin aspart  0-9 Units Subcutaneous Q4H  . ipratropium-albuterol  3 mL Nebulization BID   Continuous Infusions: . sodium chloride Stopped (05/05/20 0531)  . azithromycin 500 mg (05/07/20 1338)  . dextrose 5 % and 0.9% NaCl 50  mL/hr at 05/07/20 1000  . famotidine (PEPCID) IV 20 mg (05/07/20 1236)  . piperacillin-tazobactam (ZOSYN)  IV 3.375 g (05/07/20 1533)  .  sodium bicarbonate (isotonic) infusion in sterile water Stopped (05/04/20 1931)    Assessment & Plan:   Active Problems:   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HCC)   Hypokalemia   Encephalopathy   Weakness of extremity   AMS with encephalopathy- on admisstion. Due to toxic metabolic encph in context of severe acidemia dn DKA 10/3 continues to be stable  bcx TDN covid negative If final cx negative, will d/c iv abx in am     DKA-2/2 to noncompliance as patient forgets to take her medications Was treated in ICU with DKA protocal , off insulin gtt.  10/3-multiple episodes of hypoglycemia yesterday. All insulin was discontinued except R-ISS. Placed on D5. Encourage p.o. intake.  Continue to monitor      Hyponatremia-due to elevated BG -Improved with IV fluids and correction for glucose  Remained stable, sodium 140 today  We will continue to monitor periodically    Hypokalemia-  Supplemented, remained stable.  K is 3.7     LLE/LUE weakness-CT head -acute to subacute lacunar infarct 10/3Consulted neurology, input was appreciated, rec. ASA.  MRI ordered, with no acute intracranial abnormality. Specifically, no acute infarct.Chronic microvascular ischemic disease and generalized cerebral Atrophy. Neurology signed off.   LE b/l pain-Doppler negative for DVT    Medical noncompliance- pt's sister stated pt unable to go back to cousins house as they live toghter and pt not taking her meds as she forgets. With multiple admissions .  10/2-PT/OT recommends SNF  Palliative care consulted and pending    DVT prophylaxis: Lovenox Code Status: Full Family Communication: None at bedside  Status is: Inpatient  Remains inpatient appropriate because:Unsafe d/c plan   Dispo: The patient is from: Home  Anticipated d/c is to: SNF               Anticipated d/c date is: 1 day              Patient currently is not medically stable to d/c.had several hypoglycemic episodes. On IVF with D5 . SNF pending- case mx working on this            LOS: 4 days   Time spent: 35 minutes with more than 50% on COC    Lynn ItoSahar Moorea Boissonneault, MD [facility] Pager [phone]  If 7PM-7AM, please contact night-coverage [email or web address] Password [email or web address] 05/07/2020, 5:24 PM

## 2020-05-07 NOTE — Progress Notes (Signed)
Patient BS 46 1 Amp of 50% dextrose administered. Patient BS now 136. Will continue to monitor.

## 2020-05-07 NOTE — Progress Notes (Signed)
Patients BS 45 at 0219. NP Katherina Right notified. New orders administered PRN protocol as well as D5 with NS at 67mL/hr.BS rechecked at 0301 BS 129. Will continue to monitor.

## 2020-05-07 NOTE — Consult Note (Signed)
Neurology Consultation Reason for Consult: Abnormal head CT Referring Physician: Amery, S  CC: "I think I had something go wrong after that infusion"  History is obtained from: Chart review  HPI: Vicki [name] is a 79 y.o. female with history of previous stroke, MI, diabetes who was complaining of some mild left-sided weakness yesterday to occupational therapy.  Head CT, was performed which was read as new basal ganglia hypoattenuation, but subsequently an MRI was performed which does not show any new findings.  On my discussion with her, she states that she has had left-sided weakness since previous stroke, and that sometimes it is worse than others, but not unusual to wax and wane.    ROS: A 14 point ROS was performed and is negative except as noted in the HPI.  Past Medical History:  Diagnosis Date  . Diabetes mellitus without complication (HCC)   . GERD (gastroesophageal reflux disease)   . Hypertension   . Lesion of sciatic nerve, right side   . MI (myocardial infarction) (HCC)   . Stroke [facility])      Family History  Problem Relation Age of Onset  . Heart disease Mother   . Diabetes Mellitus II Father   . Hypertension Sister   . Diabetes Mellitus II Sister   . Breast cancer Sister 35  . Hypertension Brother   . Diabetes Mellitus II Brother      Social History:  reports that she has never smoked. She has never used smokeless tobacco. She reports that she does not drink alcohol and does not use drugs.   Exam: Current vital signs: BP (!) 179/71 (BP Location: Left Arm)   Pulse 84   Temp 98.5 F (36.9 C) (Oral)   Resp 18   Ht 5\' 3"  (1.6 m)   Wt 68.6 kg   SpO2 100%   BMI 26.79 kg/m  Vital signs in last 24 hours: Temp:  [98.4 F (36.9 C)-98.6 F (37 C)] 98.5 F (36.9 C) (10/03 0819) Pulse Rate:  [73-84] 84 (10/03 0819) Resp:  [16-18] 18 (10/03 0819) BP: (145-179)/(68-75) 179/71 (10/03 0819) SpO2:  [96 %-100 %] 100 % (10/03 0819) Weight:  [68.6 kg] 68.6 kg  (10/03 0636)   Physical Exam  Constitutional: Appears well-developed and well-nourished.  Psych: Affect appropriate to situation Eyes: No scleral injection HENT: No OP obstrucion MSK: no joint deformities.  Cardiovascular: Normal rate and regular rhythm.  Respiratory: Effort normal, non-labored breathing GI: Soft.  No distension. There is no tenderness.  Skin: WDI  Neuro: Mental Status: Patient is awake, alert, oriented to person, place, she gives the month of September (it is 3 October) year, and situation. Patient is able to give a clear and coherent history. No signs of aphasia or neglect Cranial Nerves: II: Visual Fields are full.  Pupils are reactive bilaterally. III,IV, VI: EOMI without ptosis or diploplia.  V: Facial sensation is symmetric to temperature VII: Facial movement is symmetric.  VIII: hearing is intact to voice X: Uvula elevates symmetrically XI: Shoulder shrug is symmetric. XII: tongue is midline without atrophy or fasciculations.  Motor: Tone is normal. Bulk is normal. 5/5 strength was present on the right, 4+/5 in the lef tarm, 4- in the left leg. Sensory: Sensation is symmetric to light touch and temperature in the arms and legs. Cerebellar: No clear ataxia     I have reviewed labs in epic and the results pertinent to this consultation are: BMP unremarkable  I have reviewed the images obtained: CT  head-there is a hypoattenuation abutting the basal ganglia on the right, MRI, the patient has extensive white matter disease with negative diffusion imaging.  I suspect that what was seen on the CT was some of her chronic white matter disease that gave the appearance of the new hypointensity.  Impression: 79 year old female with what I suspect is recrudescence of her previous stroke symptoms in the setting of acute physiological stressor.  Neurology was consulted given concern for stroke on CT, but the MRI confirms that there is no acute stroke.  Especially  given the history that this is not uncommon for her, no further work-up needed at this time, please call with further questions or concerns.  Recommendations: 1) continue antiplatelet therapy with aspirin 2) no further recommendations at this time, please call if further questions or concerns.   Ritta Slot, MD [facility] [phone]  If 7pm- 7am, please page neurology on call as listed in AMION.

## 2020-05-08 DIAGNOSIS — Z66 Do not resuscitate: Secondary | ICD-10-CM

## 2020-05-08 DIAGNOSIS — E1311 Other specified diabetes mellitus with ketoacidosis with coma: Secondary | ICD-10-CM

## 2020-05-08 DIAGNOSIS — Z515 Encounter for palliative care: Secondary | ICD-10-CM

## 2020-05-08 DIAGNOSIS — E1110 Type 2 diabetes mellitus with ketoacidosis without coma: Secondary | ICD-10-CM

## 2020-05-08 DIAGNOSIS — Z7189 Other specified counseling: Secondary | ICD-10-CM

## 2020-05-08 LAB — GLUCOSE, CAPILLARY
Glucose-Capillary: 161 mg/dL — ABNORMAL HIGH (ref 70–99)
Glucose-Capillary: 181 mg/dL — ABNORMAL HIGH (ref 70–99)
Glucose-Capillary: 183 mg/dL — ABNORMAL HIGH (ref 70–99)
Glucose-Capillary: 187 mg/dL — ABNORMAL HIGH (ref 70–99)
Glucose-Capillary: 247 mg/dL — ABNORMAL HIGH (ref 70–99)
Glucose-Capillary: 263 mg/dL — ABNORMAL HIGH (ref 70–99)
Glucose-Capillary: 277 mg/dL — ABNORMAL HIGH (ref 70–99)
Glucose-Capillary: 87 mg/dL (ref 70–99)

## 2020-05-08 LAB — MAGNESIUM: Magnesium: 1.8 mg/dL (ref 1.7–2.4)

## 2020-05-08 LAB — CULTURE, BLOOD (ROUTINE X 2)
Culture: NO GROWTH
Culture: NO GROWTH
Special Requests: ADEQUATE

## 2020-05-08 LAB — BASIC METABOLIC PANEL
Anion gap: 7 (ref 5–15)
BUN: 7 mg/dL — ABNORMAL LOW (ref 8–23)
CO2: 28 mmol/L (ref 22–32)
Calcium: 8.5 mg/dL — ABNORMAL LOW (ref 8.9–10.3)
Chloride: 108 mmol/L (ref 98–111)
Creatinine, Ser: 0.7 mg/dL (ref 0.44–1.00)
GFR calc Af Amer: >60 mL/min (ref >60)
GFR calc non Af Amer: >60 mL/min (ref >60)
Glucose, Bld: 86 mg/dL (ref 70–99)
Potassium: 3.5 mmol/L (ref 3.5–5.1)
Sodium: 143 mmol/L (ref 135–145)

## 2020-05-08 MED ORDER — POTASSIUM CHLORIDE CRYS ER 20 MEQ PO TBCR
20.0000 meq | EXTENDED_RELEASE_TABLET | Freq: Once | ORAL | Status: AC
  Administered 2020-05-08: 20 meq via ORAL
  Filled 2020-05-08: qty 1

## 2020-05-08 MED ORDER — ACETAMINOPHEN 500 MG PO TABS
1000.0000 mg | ORAL_TABLET | Freq: Three times a day (TID) | ORAL | Status: DC
  Administered 2020-05-08 – 2020-05-09 (×4): 1000 mg via ORAL
  Filled 2020-05-08 (×4): qty 2

## 2020-05-08 MED ORDER — MAGNESIUM SULFATE IN D5W 1-5 GM/100ML-% IV SOLN
1.0000 g | Freq: Once | INTRAVENOUS | Status: AC
  Administered 2020-05-08: 1 g via INTRAVENOUS
  Filled 2020-05-08: qty 100

## 2020-05-08 MED ORDER — HYDRALAZINE HCL 25 MG PO TABS
25.0000 mg | ORAL_TABLET | Freq: Three times a day (TID) | ORAL | Status: DC
  Administered 2020-05-08 – 2020-05-09 (×3): 25 mg via ORAL
  Filled 2020-05-08 (×3): qty 1

## 2020-05-08 MED ORDER — DICLOFENAC SODIUM 1 % EX GEL
2.0000 g | Freq: Four times a day (QID) | CUTANEOUS | Status: DC | PRN
  Filled 2020-05-08 (×2): qty 100

## 2020-05-08 NOTE — Progress Notes (Deleted)
[facility] Liaison Note:  New referral for Palliative to follow at [facility] received from Palliative NP Harvest Dark. TOC Misty Chilton Si is aware. Patient information sent to referral. Thank you. Dayna Barker BSN, RN, [facility] [employer] [phone]

## 2020-05-08 NOTE — Progress Notes (Signed)
PROGRESS NOTE    Vicki [name]  ZOX:[id number] DOB: 07-Mar-1941 DOA: 05/03/2020 PCP: Lynnea Ferrier, MD    Brief Narrative:  Pt admitted for DKA. Was admitted to ICU now transferred to the medical floor On 10/2 c/o Lt sided weakness of arm and leg, ct head completed.  10/3-states LUE weakness is better, but LLE has pain on sole of foot, which sounds more like plantar fascitis. Unable to tell me if LLE is weak Had multiple episode of hypoglycemia yesterday afternoon, all insulin was discontinued except RISS  Consultants:     Procedures:  CT head 1. Positive for new hypodensity in the right basal ganglia since the CT on 04/19/2020, compatible with acute to subacute lacunar infarct in this setting. No associated hemorrhage or mass effect.  2. Elsewhere stable non contrast CT appearance of the brain.    Antimicrobials:       Subjective: No complaints for me today. +BM per pt.   Objective: Vitals:   05/07/20 1628 05/08/20 0041 05/08/20 0500 05/08/20 0740  BP: 130/78 (!) 177/80  (!) 182/68  Pulse:  80  79  Resp:  16  16  Temp:  98.1 F (36.7 C)  98.7 F (37.1 C)  TempSrc:  Oral  Oral  SpO2:  100%  100%  Weight:   65.2 kg   Height:        Intake/Output Summary (Last 24 hours) at 05/08/2020 0844 Last data filed at 05/08/2020 8119 Gross per 24 hour  Intake 2012.44 ml  Output 800 ml  Net 1212.44 ml   Filed Weights   05/06/20 0540 05/07/20 0636 05/08/20 0500  Weight: 68 kg 68.6 kg 65.2 kg    Examination: Comfortable , nad cta no r/r/w Regular s1/s2 no m/r/g Soft benign +bs No edema Aaxox3, grossly intact. strenght is equal for me x4 Mood and affect is appropriate .    Data Reviewed: I have personally reviewed following labs and imaging studies  CBC: Recent Labs  Lab 05/03/20 1021 05/04/20 0334 05/05/20 0955 05/06/20 0441  WBC 26.2* 24.0* 17.3* 9.4  HGB 10.6* 9.9* 9.9* 9.0*  HCT 36.8 29.7* 29.9* 26.8*  MCV 108.6* 94.3 94.3 94.0  PLT 343  288 269 254   Basic Metabolic Panel: Recent Labs  Lab 05/03/20 1021 05/03/20 1413 05/04/20 0334 05/04/20 0334 05/04/20 1304 05/05/20 0437 05/06/20 0441 05/07/20 0628 05/08/20 0325  NA 134*   < > 145  --   --  143 139 140 143  K 6.0*   < > 4.5   < > 3.5 3.2* 3.7 3.7 3.5  CL 96*   < > 102  --   --  96* 101 106 108  CO2 <7*   < > 29  --   --  32 GLUCOSE 841*   < > 155*  --   --  178* 153* 112* 86  BUN 30*   < > 27*  --   --  20 12 6* 7*  CREATININE 1.90*   < > 1.39*  --   --  0.96 0.85 0.66 0.70  CALCIUM 8.8*   < > 7.9*  --   --  7.5* 7.9* 8.6* 8.5*  MG 2.3   < > 1.7  --   --  1.9 1.8 1.6* 1.8  PHOS 9.2*  --  3.1  --   --  3.1  --   --   --    < > = values in  this interval not displayed.   GFR: Estimated Creatinine Clearance: 50.1 mL/min (by C-G formula based on SCr of 0.7 mg/dL). Liver Function Tests: Recent Labs  Lab 05/03/20 1021  AST 42*  ALT 55*  ALKPHOS 80  BILITOT 2.0*  PROT 6.4*  ALBUMIN 3.3*   Recent Labs  Lab 05/03/20 1021  LIPASE 27   No results for input(s): AMMONIA in the last 168 hours. Coagulation Profile: Recent Labs  Lab 05/03/20 1021  INR 1.1   Cardiac Enzymes: No results for input(s): CKTOTAL, CKMB, CKMBINDEX, TROPONINI in the last 168 hours. BNP (last 3 results) No results for input(s): PROBNP in the last 8760 hours. HbA1C: No results for input(s): HGBA1C in the last 72 hours. CBG: Recent Labs  Lab 05/07/20 1633 05/07/20 2004 05/08/20 0038 05/08/20 0405 05/08/20 0740  GLUCAP 185* 185* 161* 87 277*   Lipid Profile: No results for input(s): CHOL, HDL, LDLCALC, TRIG, CHOLHDL, LDLDIRECT in the last 72 hours. Thyroid Function Tests: No results for input(s): TSH, T4TOTAL, FREET4, T3FREE, THYROIDAB in the last 72 hours. Anemia Panel: No results for input(s): VITAMINB12, FOLATE, FERRITIN, TIBC, IRON, RETICCTPCT in the last 72 hours. Sepsis Labs: Recent Labs  Lab 05/03/20 1021  PROCALCITON 0.41    Recent Results (from  the past 240 hour(s))  Respiratory Panel by RT PCR (Flu A&B, Covid) - Nasopharyngeal Swab     Status: None   Collection Time: 05/03/20 10:21 AM   Specimen: Nasopharyngeal Swab  Result Value Ref Range Status   SARS Coronavirus 2 by RT PCR NEGATIVE NEGATIVE Final    Comment: (NOTE) SARS-CoV-2 target nucleic acids are NOT DETECTED.  The SARS-CoV-2 RNA is generally detectable in upper respiratoy specimens during the acute phase of infection. The lowest concentration of SARS-CoV-2 viral copies this assay can detect is 131 copies/mL. A negative result does not preclude SARS-Cov-2 infection and should not be used as the sole basis for treatment or other patient management decisions. A negative result may occur with  improper specimen collection/handling, submission of specimen other than nasopharyngeal swab, presence of viral mutation(s) within the areas targeted by this assay, and inadequate number of viral copies (<131 copies/mL). A negative result must be combined with clinical observations, patient history, and epidemiological information. The expected result is Negative.  Fact Sheet for Patients:  https://www.moore.com/https://www.fda.gov/media/142436/download  Fact Sheet for Healthcare Providers:  https://www.young.biz/https://www.fda.gov/media/142435/download  This test is no t yet approved or cleared by the [place] FDA and  has been authorized for detection and/or diagnosis of SARS-CoV-2 by FDA under an Emergency Use Authorization (EUA). This EUA will remain  in effect (meaning this test can be used) for the duration of the COVID-19 declaration under Section 564(b)(1) of the Act, 21 U.S.C. section 360bbb-3(b)(1), unless the authorization is terminated or revoked sooner.     Influenza A by PCR NEGATIVE NEGATIVE Final   Influenza B by PCR NEGATIVE NEGATIVE Final    Comment: (NOTE) The Xpert Xpress SARS-CoV-2/FLU/RSV assay is intended as an aid in  the diagnosis of influenza from Nasopharyngeal swab specimens and   should not be used as a sole basis for treatment. Nasal washings and  aspirates are unacceptable for Xpert Xpress SARS-CoV-2/FLU/RSV  testing.  Fact Sheet for Patients: https://www.moore.com/https://www.fda.gov/media/142436/download  Fact Sheet for Healthcare Providers: https://www.young.biz/https://www.fda.gov/media/142435/download  This test is not yet approved or cleared by the [place] FDA and  has been authorized for detection and/or diagnosis of SARS-CoV-2 by  FDA under an Emergency Use Authorization (EUA). This EUA will remain  in effect (meaning this test can be used) for the duration of the  Covid-19 declaration under Section 564(b)(1) of the Act, 21  U.S.C. section 360bbb-3(b)(1), unless the authorization is  terminated or revoked. Performed at [facility], [address], [place], [place] [address]   Urine Culture     Status: None   Collection Time: 05/03/20 10:21 AM   Specimen: Urine, Catheterized  Result Value Ref Range Status   Specimen Description   Final    URINE, CATHETERIZED Performed at [facility], [address] [place], [place] [address]    Special Requests   Final    NONE Performed at [facility], [address] [place], [place] [address]    Culture   Final    NO GROWTH Performed at [facility], [address] [place], [place] [address]    Report Status 05/04/2020 FINAL  Final  Culture, blood (routine x 2)     Status: None   Collection Time: 05/03/20  2:13 PM   Specimen: BLOOD  Result Value Ref Range Status   Specimen Description BLOOD LEFT ANTECUBITAL  Final   Special Requests   Final    BOTTLES DRAWN AEROBIC AND ANAEROBIC Blood Culture adequate volume   Culture   Final    NO GROWTH 5 DAYS Performed at [facility], [address] [place], [place] [address]    Report Status 05/08/2020 FINAL  Final  Culture, blood (routine x 2)     Status: None   Collection Time: 05/03/20  2:13 PM   Specimen: BLOOD  Result Value Ref Range  Status   Specimen Description BLOOD BLOOD LEFT HAND  Final   Special Requests   Final    BOTTLES DRAWN AEROBIC AND ANAEROBIC Blood Culture results may not be optimal due to an inadequate volume of blood received in culture bottles   Culture   Final    NO GROWTH 5 DAYS Performed at [facility], [address] [place], [place] [address]    Report Status 05/08/2020 FINAL  Final         Radiology Studies: CT HEAD WO CONTRAST  Result Date: 05/06/2020 CLINICAL DATA:  79 year old female with altered mental status last month. Patient reports left hand and leg weakness beginning a few days ago. EXAM: CT HEAD WITHOUT CONTRAST TECHNIQUE: Contiguous axial images were obtained from the base of the skull through the vertex without intravenous contrast. COMPARISON:  Head CT 04/19/2020 and earlier.  Brain MRI 09/12/2018. FINDINGS: Brain: No midline shift, mass effect, or evidence of intracranial mass lesion. No ventriculomegaly. Stable cerebral volume. No acute intracranial hemorrhage identified. Largely stable gray-white matter differentiation throughout the brain. Patchy and confluent bilateral white matter hypodensity. However, there is new hypodensity in the right basal ganglia since 04/19/2020 on series 2, image 11. Other deep gray nuclei appears stable. No superimposed acute cortically based infarct identified. Vascular: Calcified atherosclerosis at the skull base. No suspicious intracranial vascular hyperdensity. Skull: No acute osseous abnormality identified. Sinuses/Orbits: Visualized paranasal sinuses and mastoids are stable and well pneumatized. Other: Visualized orbits and scalp soft tissues are stable and negative. IMPRESSION: 1. Positive for new hypodensity in the right basal ganglia since the CT on 04/19/2020, compatible with acute to subacute lacunar infarct in this setting. No associated hemorrhage or mass effect. 2. Elsewhere stable non contrast CT appearance of the brain.  Electronically Signed   By: Odessa Fleming M.D.   On: 05/06/2020 16:15   MR  BRAIN WO CONTRAST  Result Date: 05/07/2020 CLINICAL DATA:  Neuro deficit, acute stroke suspected. EXAM: MRI HEAD WITHOUT CONTRAST TECHNIQUE: Multiplanar, multiecho pulse sequences of the brain and surrounding structures were obtained without intravenous contrast. COMPARISON:  CT head 05/06/2020 FINDINGS: Brain: No evidence of acute infarct. No acute hemorrhage. Mild to moderate generalized cerebral volume loss with ex vacuo ventricular dilation. No hydrocephalus. T2/FLAIR hyperintensity within the white matter and bilateral basal ganglia, compatible with chronic microvascular ischemic disease. Vascular: Proximal arterial flow voids are maintained at the skull base. Skull and upper cervical spine: Normal marrow signal. Sinuses/Orbits: Mild ethmoid air cell and frontal sinus mucosal thickening. No air-fluid levels. Other: None IMPRESSION: 1. No acute intracranial abnormality. Specifically, no acute infarct. 2. 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 Electronically Signed   By: Feliberto Harts MD   On: 05/07/2020 14:40        Scheduled Meds: . aspirin EC  81 mg Oral Daily  . enoxaparin (LOVENOX) injection  40 mg Subcutaneous Q24H  . insulin aspart  0-9 Units Subcutaneous Q4H   Continuous Infusions: . sodium chloride Stopped (05/05/20 0531)  . azithromycin 500 mg (05/07/20 1338)  . dextrose 5 % and 0.9% NaCl 50 mL/hr at 05/07/20 1000  . famotidine (PEPCID) IV 20 mg (05/07/20 1236)  . magnesium sulfate bolus IVPB    . piperacillin-tazobactam (ZOSYN)  IV 3.375 g (05/08/20 0544)  .  sodium bicarbonate (isotonic) infusion in sterile water Stopped (05/04/20 1931)    Assessment & Plan:   Active Problems:   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HCC)   Hypokalemia   Encephalopathy   Weakness of extremity   AMS with encephalopathy- on admisstion. Due to toxic metabolic encph in context of severe acidemia dn  DKA 10/3 continues to be stable  10/4-remains stable. cx negative, will dc iv abx.  covid negative Avoid sedatives and pain meds    DKA-2/2 to noncompliance as patient forgets to take her medications Was treated in ICU with DKA protocal , off insulin gtt.  10/3-multiple episodes of hypoglycemia yesterday. All insulin was discontinued except 10/4-hypoglycemic R-ISS. PlacWe will continue on sliding scale, encourage p.o. intake.  Will DC D5 IV fluid.    Hyponatremia-due to elevated BG -Improved with IV fluids and correction for glucose  10/4-, Today's sodium is 143 remained stable, sodium 140 today  Continue to monitor   Hypokalemia-  Supplemented, remained stable.  K is 3.5 Continue to monitor   LLE/LUE weakness-CT head -acute to subacute lacunar infarct 10/3Consulted neurology, input was appreciated, rec. ASA.  MRI ordered, with no acute intracranial abnormality. Specifically, no acute infarct.Chronic microvascular ischemic disease and generalized cerebral Atrophy. Neurology signed off.   LE b/l pain-Doppler negative for DVT    Medical noncompliance- pt's sister stated pt unable to go back to cousins house as they live toghter and pt not taking her meds as she forgets. With multiple admissions .  10/2-PT/OT recommends SNF  Palliative care consulted input was appreciated we will follow up patient as outpatient while she goes to SNF    DVT prophylaxis: Lovenox Code Status: Full Family Communication: None at bedside  Status is: Inpatient  Remains inpatient appropriate because:Unsafe d/c plan   Dispo: The patient is from: Home              Anticipated d/c is to: SNF              Anticipated d/c date is: 1 day  Patient currently is medically stable.  SNF pending Case management working on this           LOS: 5 days   Time spent: 35 minutes with more than 50% on COC    Lynn Ito, MD [facility] Pager [phone]  If 7PM-7AM,  please contact night-coverage [email or web address] Password [facility] 05/08/2020, 8:44 AM

## 2020-05-08 NOTE — TOC Progression Note (Addendum)
Transition of Care [facility]) - Progression Note    Patient Details  Name: Vicki [name] MRN: [id number] Date of Birth: 1940/10/08  [facility] CM/SW Contact  Trenton Founds, RN Phone Number: 05/08/2020, 4:06 PM  Clinical Narrative:   RNCM started insurance authorization through [facility]. Reference # [id number], clinicals faxed to [phone].      Expected Discharge Plan: Skilled Nursing Facility Barriers to Discharge: Continued Medical Work up  Expected Discharge Plan and Services Expected Discharge Plan: Skilled Nursing Facility In-house Referral: Clinical Social Work     Living arrangements for the past 2 months: Single Family Home                                       Social Determinants of Health (SDOH) Interventions    Readmission Risk Interventions Readmission Risk Prevention Plan 04/28/2020 01/20/2020 10/15/2019  Transportation Screening Complete Complete Complete  PCP or Specialist Appt within 3-5 Days - Complete Complete  HRI or Home Care Consult - - Complete  Social Work Consult for Recovery Care Planning/Counseling - Complete -  Palliative Care Screening - Not Applicable -  Medication Review Oceanographer) Complete Complete Complete  PCP or Specialist appointment within 3-5 days of discharge Complete - -  HRI or Home Care Consult Complete - -  SW Recovery Care/Counseling Consult Complete - -  Palliative Care Screening Not Applicable - -  Skilled Nursing Facility Not Applicable - -  Some recent data might be hidden

## 2020-05-08 NOTE — Consult Note (Signed)
Consultation Note Date: 05/08/2020   Patient Name: Vicki [name]  DOB: January 16, 1941  MRN: [id number]  Age / Sex: 79 y.o., female  PCP: Adin Hector, MD Referring Physician: Nolberto Hanlon, MD  Reason for Consultation: Establishing goals of care  HPI/Patient Profile: 79 y.o. female  with past medical history of CVA, MI, DM, and HTN admitted on 05/03/2020 with DKA. She has had multiple admissions in the last 6 months r/t DKA. She has difficulty with medication management. PMT consulted to discuss Omega.    Clinical Assessment and Goals of Care: I have reviewed medical records including EPIC notes, labs and imaging, received report from RN, assessed the patient and then met with patient to discuss diagnosis prognosis, GOC, EOL wishes, disposition and options.  Patient was very pleasant. Oriented to time, place, person, and situation. Some forgetfulness noted throughout conversation but did seem to have capacity to make decisions for herself.   I introduced Palliative Medicine as specialized medical care for people living with serious illness. It focuses on providing relief from the symptoms and stress of a serious illness. The goal is to improve quality of life for both the patient and the family.  We discussed a brief life review of the patient.  She tells me she has been living with her 63 year old cousin but does not think she can continue this because she needs more help than her cousin is able to provide. She tells me about her siblings and how they are good support for her. She tells me she had one son and he passed away in his 35s.   As far as functional and nutritional status, She tells me she is able to ambulate with a walker and a cane. She shares that her appetite has been quite poor recently. She also shares her awareness of memory problems.    We discussed patient's current illness and what it means in the larger context of patient's  on-going co-morbidities.  Natural disease trajectory and expectations at EOL were discussed. She shares about frequent hospitalizations d/t medication mismanagement - she forgets which medications to date. She tells me that her body is aging and she is aware of her declining health. She tells me about aches throughout her body that she attributes to old age - she is agreeable to scheduled tylenol as she tells me this has been helpful in the past.   I attempted to elicit values and goals of care important to the patient.  Her family is most important to her and she does not want to burden them. Due to this she is hopeful for placement in rehab and then LTC facility so that others can help care for her.   The difference between aggressive medical intervention and comfort care was considered in light of the patient's goals of care. Advance directives, concepts specific to code status, artificial feeding and hydration, and rehospitalization were considered and discussed. [name] does share that she would not want resuscitation efforts but asks that I confirm this with her sister. She also shares that she would be agreeable to rehospitalization if indicated.   Discussed with patient the importance of continued conversation with family and the medical providers regarding overall plan of care and treatment options, ensuring decisions are within the context of the patient's values and GOCs.    Palliative Care services outpatient were explained and offered. Patient is agreeable to palliative seeing her at SNF.  Questions and concerns were addressed. The patient was encouraged  to call with questions or concerns.   Following conversation with patient I also called her sisters to review the above information. Sister, [name], agrees with code status change to DNR. [name] did not answer.   Primary Decision Maker PATIENT   SUMMARY OF RECOMMENDATIONS   - code status changed to DNR - patient interested in d/c to  rehab and then transition to long term care - patient would like full scope treatment including rehospitalization if necessary - outpatient palliative to follow - scheduled tylenol 1000 mg TID added  Code Status/Advance Care Planning:  DNR  Prognosis:   Unable to determine  Discharge Planning: Skilled Nursing Facility for rehab with Palliative care service follow-up      Primary Diagnoses: Present on Admission: . Encephalopathy . 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HCC) . Hypokalemia . Weakness of extremity   I have reviewed the medical record, interviewed the patient and family, and examined the patient. The following aspects are pertinent.  Past Medical History:  Diagnosis Date  . Diabetes mellitus without complication (HCC)   . GERD (gastroesophageal reflux disease)   . Hypertension   . Lesion of sciatic nerve, right side   . MI (myocardial infarction) (HCC)   . Stroke [facility])    Social History   Socioeconomic History  . Marital status: Widowed    Spouse name: Not on file  . Number of children: Not on file  . Years of education: Not on file  . Highest education level: Not on file  Occupational History  . Not on file  Tobacco Use  . Smoking status: Never Smoker  . Smokeless tobacco: Never Used  Vaping Use  . Vaping Use: Never used  Substance and Sexual Activity  . Alcohol use: No  . Drug use: No  . Sexual activity: Not Currently  Other Topics Concern  . Not on file  Social History Narrative   Lives with her cousin   Independent at baseline   Social Determinants of Health   Financial Resource Strain:   . Difficulty of Paying Living Expenses: Not on file  Food Insecurity:   . Worried About Programme researcher, broadcasting/film/video in the Last Year: Not on file  . Ran Out of Food in the Last Year: Not on file  Transportation Needs:   . Lack of Transportation (Medical): Not on file  . Lack of Transportation (Non-Medical): Not on file  Physical Activity:   . Days of Exercise per Week: Not on  file  . Minutes of Exercise per Session: Not on file  Stress:   . Feeling of Stress : Not on file  Social Connections:   . Frequency of Communication with Friends and Family: Not on file  . Frequency of Social Gatherings with Friends and Family: Not on file  . Attends Religious Services: Not on file  . Active Member of Clubs or Organizations: Not on file  . Attends Banker Meetings: Not on file  . Marital Status: Not on file   Family History  Problem Relation Age of Onset  . Heart disease Mother   . Diabetes Mellitus II Father   . Hypertension Sister   . Diabetes Mellitus II Sister   . Breast cancer Sister 35  . Hypertension Brother   . Diabetes Mellitus II Brother    Scheduled Meds: . acetaminophen  1,000 mg Oral TID  . aspirin EC  81 mg Oral Daily  . enoxaparin (LOVENOX) injection  40 mg Subcutaneous Q24H  . insulin aspart  0-9  Units Subcutaneous Q4H   Continuous Infusions: . sodium chloride Stopped (05/05/20 0531)  . dextrose 5 % and 0.9% NaCl 50 mL/hr at 05/07/20 1000  . famotidine (PEPCID) IV 20 mg (05/07/20 1236)  .  sodium bicarbonate (isotonic) infusion in sterile water Stopped (05/04/20 1931)   PRN Meds:.sodium chloride, dextrose, diclofenac Sodium, docusate sodium, hydrALAZINE, ipratropium-albuterol, polyethylene glycol No Known Allergies Review of Systems  Constitutional: Positive for activity change, appetite change and fatigue.  Respiratory: Negative for shortness of breath.   Musculoskeletal: Positive for arthralgias.  Neurological: Positive for weakness.    Physical Exam Constitutional:      General: She is not in acute distress. Pulmonary:     Effort: Pulmonary effort is normal. No respiratory distress.  Musculoskeletal:     Right lower leg: No edema.     Left lower leg: No edema.  Skin:    General: Skin is warm and dry.  Neurological:     Mental Status: She is alert and oriented to person, place, and time.  Psychiatric:        Mood  and Affect: Mood normal.        Behavior: Behavior normal.     Vital Signs: BP (!) 182/68 (BP Location: Right Arm)   Pulse 79   Temp 98.7 F (37.1 C) (Oral)   Resp 16   Ht $R'5\' 3"'rw$  (1.6 m)   Wt 65.2 kg   SpO2 100%   BMI 25.46 kg/m  Pain Scale: 0-10 POSS *See Group Information*: 1-Acceptable,Awake and alert Pain Score: 10-Worst pain ever   SpO2: SpO2: 100 % O2 Device:SpO2: 100 % O2 Flow Rate: .O2 Flow Rate (L/min): 4 L/min  IO: Intake/output summary:   Intake/Output Summary (Last 24 hours) at 05/08/2020 1350 Last data filed at 05/08/2020 1048 Gross per 24 hour  Intake 1932.44 ml  Output 850 ml  Net 1082.44 ml    LBM: Last BM Date: 05/06/20 Baseline Weight: Weight: 66 kg Most recent weight: Weight: 65.2 kg     Palliative Assessment/Data: PPS 50%    Time Total: 85 minutes Greater than 50%  of this time was spent counseling and coordinating care related to the above assessment and plan.  Juel Burrow, DNP, AGNP-C Palliative Medicine Team [phone] Pager: [phone]

## 2020-05-08 NOTE — Care Management Important Message (Signed)
Important Message  Patient Details  Name: Vicki [name] MRN: [id number] Date of Birth: 01/06/1941   Medicare Important Message Given:  Yes     Johnell Comings 05/08/2020, 11:17 AM

## 2020-05-08 NOTE — Progress Notes (Signed)
[facility] Room 159 [facility]) Hospital Liaison RN note:  Received new referral for [facility] out patient Palliative program to follow post discharge from [facility], NP. Patient information given to referral. Dow Adolph, TOC is aware. Will follow for disposition.  Thank you for this referral.  Cyndra Numbers, RN [facility] Liaison [phone]

## 2020-05-08 NOTE — Consult Note (Addendum)
PHARMACY CONSULT NOTE - FOLLOW UP  Pharmacy Consult for Electrolyte Monitoring and Replacement   Recent Labs: Potassium (mmol/L)  Date Value  05/08/2020 3.5  04/09/2012 4.0   Magnesium (mg/dL)  Date Value  17/91/5056 1.6 (L)  04/05/2012 2.4   Calcium (mg/dL)  Date Value  97/94/8016 8.5 (L)   Calcium, Total (mg/dL)  Date Value  55/37/4827 8.8   Albumin (g/dL)  Date Value  07/86/7544 3.3 (L)  02/17/2012 3.2 (L)   Phosphorus (mg/dL)  Date Value  92/08/69 3.1   Sodium (mmol/L)  Date Value  05/08/2020 143  04/09/2012 142   Assessment: 78 y.o. female with a past medical history of diabetes, gastric reflux, hypertension, prior MI and CVA presents to the emergency department for decreased responsiveness and elevated blood glucose.   Pharmacy has been consulted to monitor and replenish electrolytes.  K 3.5 borderline hypokalemia   Goal of Therapy:   Electrolytes WNL  Plan:   Will order KCL PO 20 mEqx1 dose. Ordered Mg as an add-on and will follow up with result later   Will recheck electrolytes tomorrow with AM labs.  Update @ 0838: Mg 1.8. Will order Mg IV 1g x1 dose and recheck with AM labs tomorrow.  Katha Cabal 05/08/2020 7:37 AM

## 2020-05-09 LAB — BASIC METABOLIC PANEL
Anion gap: 10 (ref 5–15)
BUN: 7 mg/dL — ABNORMAL LOW (ref 8–23)
CO2: 24 mmol/L (ref 22–32)
Calcium: 8.5 mg/dL — ABNORMAL LOW (ref 8.9–10.3)
Chloride: 105 mmol/L (ref 98–111)
Creatinine, Ser: 0.64 mg/dL (ref 0.44–1.00)
GFR calc Af Amer: >60 mL/min (ref >60)
GFR calc non Af Amer: >60 mL/min (ref >60)
Glucose, Bld: 240 mg/dL — ABNORMAL HIGH (ref 70–99)
Potassium: 3.5 mmol/L (ref 3.5–5.1)
Sodium: 139 mmol/L (ref 135–145)

## 2020-05-09 LAB — MAGNESIUM: Magnesium: 1.7 mg/dL (ref 1.7–2.4)

## 2020-05-09 LAB — GLUCOSE, CAPILLARY
Glucose-Capillary: 247 mg/dL — ABNORMAL HIGH (ref 70–99)
Glucose-Capillary: 241 mg/dL — ABNORMAL HIGH (ref 70–99)
Glucose-Capillary: 263 mg/dL — ABNORMAL HIGH (ref 70–99)

## 2020-05-09 LAB — SARS CORONAVIRUS 2 BY RT PCR (HOSPITAL ORDER, PERFORMED IN ~~LOC~~ HOSPITAL LAB): SARS Coronavirus 2: NEGATIVE

## 2020-05-09 MED ORDER — POTASSIUM CHLORIDE CRYS ER 20 MEQ PO TBCR
40.0000 meq | EXTENDED_RELEASE_TABLET | Freq: Once | ORAL | Status: AC
  Administered 2020-05-09: 40 meq via ORAL
  Filled 2020-05-09: qty 2

## 2020-05-09 MED ORDER — DOCUSATE SODIUM 100 MG PO CAPS: 100.0000 mg | ORAL_CAPSULE | Freq: Two times a day (BID) | ORAL | 0 refills | Status: AC | PRN

## 2020-05-09 MED ORDER — MAGNESIUM SULFATE 2 GM/50ML IV SOLN
2.0000 g | Freq: Once | INTRAVENOUS | Status: AC
  Administered 2020-05-09: 2 g via INTRAVENOUS
  Filled 2020-05-09: qty 50

## 2020-05-09 MED ORDER — POLYETHYLENE GLYCOL 3350 17 G PO PACK: 17.0000 g | PACK | Freq: Every day | ORAL | 0 refills | Status: AC | PRN

## 2020-05-09 MED ORDER — DICLOFENAC SODIUM 1 % EX GEL: 2.0000 g | Freq: Four times a day (QID) | CUTANEOUS | Status: AC | PRN

## 2020-05-09 MED ORDER — INSULIN LISPRO 100 UNIT/ML ~~LOC~~ SOLN: 2.0000 [IU] | Freq: Three times a day (TID) | SUBCUTANEOUS | 11 refills | Status: AC

## 2020-05-09 MED ORDER — INSULIN GLARGINE 100 UNIT/ML ~~LOC~~ SOLN: 10.0000 [IU] | Freq: Every day | SUBCUTANEOUS | 11 refills | Status: AC

## 2020-05-09 MED ORDER — PROMETHAZINE HCL 25 MG PO TABS
12.5000 mg | ORAL_TABLET | Freq: Once | ORAL | Status: DC
  Filled 2020-05-09: qty 1

## 2020-05-09 NOTE — Discharge Summary (Signed)
Vicki [name] ZOX:[id number] DOB: [birth year] DOA: 05/03/2020  PCP: Lynnea Ferrier, MD  Admit date: 05/03/2020 Discharge date: 05/09/2020  Admitted From: home Disposition:  SNF  Recommendations for Outpatient Follow-up:  1. Follow up with PCP in 1 week 2. Please obtain BMP/CBC in one week      Discharge Condition:Stable CODE STATUS:DNR  Diet recommendation: Carb   Brief/Interim Summary: 79 yo F with hx of HTN, GERD, Hx of CAD w/MI, Hx of CVA, She was admitted to hospital 3 times for DKA in 2021 and once for hypoglycemia. She came in this time with lethargy and kussmaul respiration.  She was found to have blood glucose >800 and had pH <6.99.  She is being admitted to MICU from ER for severe DKA and lethargy.  He was started on DKA protocol and hydration.  Once he was off insulin and stabilized he was transferred to the medical floor under the hospitalist service. During her hospitalization she complained of left upper and lower extremity weakness.  She does have chronic weakness since her previous stroke but she felt it was worse.  Neurology was consulted.  Head CT was performed stated positive for new hypodensity in the right basal ganglia since the CT on 04/19/2020, compatible with acute to subacute lacunar infarctin this setting. No associated hemorrhage or mass effect.  Patient had an MRI that was reported no acute intracranial abnormality. Specifically, no acute infarct.Chronic microvascular ischemic disease and generalized cerebral atrophy.  Per neurology patient based on the MRI had no acute stroke.  Stroke was ruled out.  No further work-up was needed.  To continue aspirin.    AMS with encephalopathy- on admisstion. Due to toxic metabolic encph in context of severe acidemia dn DKA Improved at at baseline  Blood cultures to date negative  Covid negative  We will continue with IV antibiotics till final results of culture come back    DKA-2/2 to noncompliance as patient  forgets to take her medications Was treated in ICU with DKA protocal , off insulin gtt.  2 episodes of hypoglycemia and was started on D5.  Likely due to her decreased p.o. intake.  Her p.o. intake improved and she was off of D5 with improvement of her blood glucose levels.  She will be discharged on Lantus 10 subcu daily and increase as tolerated and needed.  She also has NovoLog with meals that needs to be adjusted as outpatient as tolerated as needed.       Hyponatremia-due to elevated BG  improved with IV fluids and correction of her glucose levels  Remained stable, sodium 139 today    Hypokalemia-  Was supplemented and now K is stable at 3.5 Continue to monitor periodically as outpatient  LLE/LUE weakness-MRI without any acute stroke.  Stroke was ruled out.     LE b/l pain-venous Doppler was negative for DVT bilateral   Medical noncompliance- pt's sister stated pt unable to go back to cousins house as they live toghter and pt not taking her meds as she forgets. With multiple admissions .  10/2-PT/OT recommends SNF.  Palliative was consulted and palliative would follow-up with patient has outpatient at SNF.   Discharge Diagnoses:  Active Problems:   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HCC)   Hypokalemia   Encephalopathy   Weakness of extremity   Diabetic acidosis (HCC)   Goals of care, counseling/discussion   Palliative care by specialist   DNR (do not resuscitate)    Discharge Instructions  Discharge Instructions  Call MD for:  temperature >100.4   Complete by: As directed    Diet - low sodium heart healthy   Complete by: As directed    Increase activity slowly   Complete by: As directed      Allergies as of 05/09/2020   No Known Allergies     Medication List    STOP taking these medications   cefdinir 300 MG capsule Commonly known as: OMNICEF   pantoprazole 40 MG tablet Commonly known as: Protonix     TAKE these medications   acetaminophen 500 MG  tablet Commonly known as: TYLENOL Take 2 tablets (1,000 mg total) by mouth every 6 (six) hours as needed for mild pain or headache.   aspirin 81 MG EC tablet Take 1 tablet (81 mg total) by mouth daily.   Centrum Adults Tabs Take 1 tablet by mouth every morning.   citalopram 20 MG tablet Commonly known as: CELEXA Take 20 mg by mouth daily.   Combigan 0.2-0.5 % ophthalmic solution Generic drug: brimonidine-timolol Place 1 drop into both eyes daily.   diclofenac Sodium 1 % Gel Commonly known as: VOLTAREN Apply 2 g topically 4 (four) times daily as needed (for joint pain).   docusate sodium 100 MG capsule Commonly known as: COLACE Take 1 capsule (100 mg total) by mouth 2 (two) times daily as needed for mild constipation.   donepezil 5 MG tablet Commonly known as: ARICEPT Take 5 mg by mouth at bedtime.   Ensure Max Protein Liqd Take 330 mLs (11 oz total) by mouth 2 (two) times daily between meals.   famotidine 20 MG tablet Commonly known as: PEPCID Take 20 mg by mouth daily.   hydrALAZINE 50 MG tablet Commonly known as: APRESOLINE Take 50 mg by mouth 3 (three) times daily.   insulin glargine 100 UNIT/ML injection Commonly known as: LANTUS Inject 0.1 mLs (10 Units total) into the skin at bedtime. What changed: how much to take   insulin lispro 100 UNIT/ML injection Commonly known as: HumaLOG Inject 0.02 mLs (2 Units total) into the skin 3 (three) times daily with meals. (plus sliding scale) What changed: how much to take   isosorbide mononitrate 30 MG 24 hr tablet Commonly known as: IMDUR Take 30 mg by mouth daily.   loratadine 10 MG tablet Commonly known as: CLARITIN Take 10 mg by mouth daily.   losartan 50 MG tablet Commonly known as: COZAAR Take 50 mg by mouth at bedtime.   lovastatin 40 MG tablet Commonly known as: MEVACOR Take 40 mg by mouth daily after supper.   Magnesium Oxide 400 (240 Mg) MG Tabs Take 400 mg by mouth daily.   memantine 5 MG  tablet Commonly known as: NAMENDA Take 5 mg by mouth 2 (two) times daily.   polyethylene glycol 17 g packet Commonly known as: MIRALAX / GLYCOLAX Take 17 g by mouth daily as needed for moderate constipation.   saccharomyces boulardii 250 MG capsule Commonly known as: FLORASTOR Take 250 mg by mouth 2 (two) times daily.   simethicone 80 MG chewable tablet Commonly known as: MYLICON Chew 1 tablet (80 mg total) by mouth 4 (four) times daily as needed for flatulence.       Contact information for after-discharge care    Destination    [facility] Preferred SNF .   Service: Skilled Paramedic information: [address] [place] [address] [phone]  No Known Allergies  Consultations:  Neurology and palliative care   Procedures/Studies: CT HEAD WO CONTRAST  Result Date: 05/06/2020 CLINICAL DATA:  79 year old female with altered mental status last month. Patient reports left hand and leg weakness beginning a few days ago. EXAM: CT HEAD WITHOUT CONTRAST TECHNIQUE: Contiguous axial images were obtained from the base of the skull through the vertex without intravenous contrast. COMPARISON:  Head CT 04/19/2020 and earlier.  Brain MRI 09/12/2018. FINDINGS: Brain: No midline shift, mass effect, or evidence of intracranial mass lesion. No ventriculomegaly. Stable cerebral volume. No acute intracranial hemorrhage identified. Largely stable gray-white matter differentiation throughout the brain. Patchy and confluent bilateral white matter hypodensity. However, there is new hypodensity in the right basal ganglia since 04/19/2020 on series 2, image 11. Other deep gray nuclei appears stable. No superimposed acute cortically based infarct identified. Vascular: Calcified atherosclerosis at the skull base. No suspicious intracranial vascular hyperdensity. Skull: No acute osseous abnormality identified. Sinuses/Orbits: Visualized paranasal  sinuses and mastoids are stable and well pneumatized. Other: Visualized orbits and scalp soft tissues are stable and negative. IMPRESSION: 1. Positive for new hypodensity in the right basal ganglia since the CT on 04/19/2020, compatible with acute to subacute lacunar infarct in this setting. No associated hemorrhage or mass effect. 2. Elsewhere stable non contrast CT appearance of the brain. Electronically Signed   By: Odessa Fleming M.D.   On: 05/06/2020 16:15   CT Head Wo Contrast  Result Date: 04/19/2020 CLINICAL DATA:  Altered mental status EXAM: CT HEAD WITHOUT CONTRAST TECHNIQUE: Contiguous axial images were obtained from the base of the skull through the vertex without intravenous contrast. COMPARISON:  01/19/2020 FINDINGS: Brain: There is normal anatomic configuration of the brain. Mild parenchymal volume loss is commensurate with the patient's age. Moderate periventricular and subcortical white matter changes are present likely reflecting the sequela of small vessel ischemia. Borderline ventriculomegaly likely reflects the sequela of central atrophy. Remote infarct noted within the left subinsular white matter. No evidence of acute intracranial hemorrhage or infarct. No abnormal mass effect or midline shift. No abnormal intra or extra-axial mass lesion or fluid collection. The cerebellum is unremarkable. Vascular: No asymmetric hyperdense vasculature at the skull base. Skull: Intact Sinuses/Orbits: The paranasal sinuses are clear. The orbits are unremarkable. Other: Mastoid air cells and middle ear cavities are clear. IMPRESSION: 1. No acute intracranial abnormality. 2. Moderate periventricular and subcortical white matter changes likely reflecting the sequela of small vessel ischemia. Remote left subinsular white matter infarct. Electronically Signed   By: Helyn Numbers MD   On: 04/19/2020 19:40   MR BRAIN WO CONTRAST  Result Date: 05/07/2020 CLINICAL DATA:  Neuro deficit, acute stroke suspected. EXAM:  MRI HEAD WITHOUT CONTRAST TECHNIQUE: Multiplanar, multiecho pulse sequences of the brain and surrounding structures were obtained without intravenous contrast. COMPARISON:  CT head 05/06/2020 FINDINGS: Brain: No evidence of acute infarct. No acute hemorrhage. Mild to moderate generalized cerebral volume loss with ex vacuo ventricular dilation. No hydrocephalus. T2/FLAIR hyperintensity within the white matter and bilateral basal ganglia, compatible with chronic microvascular ischemic disease. Vascular: Proximal arterial flow voids are maintained at the skull base. Skull and upper cervical spine: Normal marrow signal. Sinuses/Orbits: Mild ethmoid air cell and frontal sinus mucosal thickening. No air-fluid levels. Other: None IMPRESSION: 1. No acute intracranial abnormality. Specifically, no acute infarct. 2. 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 Electronically Signed   By: Feliberto Harts MD   On: 05/07/2020 14:40   US Venous Img Lower  Bilateral (DVT)  Result Date: 05/05/2020 CLINICAL DATA:  Bilateral lower extremity pain and edema. EXAM: BILATERAL LOWER EXTREMITY VENOUS DOPPLER ULTRASOUND TECHNIQUE: Gray-scale sonography with graded compression, as well as color Doppler and duplex ultrasound were performed to evaluate the lower extremity deep venous systems from the level of the common femoral vein and including the common femoral, femoral, profunda femoral, popliteal and calf veins including the posterior tibial, peroneal and gastrocnemius veins when visible. The superficial great saphenous vein was also interrogated. Spectral Doppler was utilized to evaluate flow at rest and with distal augmentation maneuvers in the common femoral, femoral and popliteal veins. COMPARISON:  None. FINDINGS: RIGHT LOWER EXTREMITY Common Femoral Vein: No evidence of thrombus. Normal compressibility, respiratory phasicity and response to augmentation. Saphenofemoral Junction: No evidence of  thrombus. Normal compressibility and flow on color Doppler imaging. Profunda Femoral Vein: No evidence of thrombus. Normal compressibility and flow on color Doppler imaging. Femoral Vein: No evidence of thrombus. Normal compressibility, respiratory phasicity and response to augmentation. Popliteal Vein: No evidence of thrombus. Normal compressibility, respiratory phasicity and response to augmentation. Calf Veins: No evidence of thrombus. Normal compressibility and flow on color Doppler imaging. Superficial Great Saphenous Vein: No evidence of thrombus. Normal compressibility. Venous Reflux:  None. Other Findings: No evidence of superficial thrombophlebitis or abnormal fluid collection. LEFT LOWER EXTREMITY Common Femoral Vein: No evidence of thrombus. Normal compressibility, respiratory phasicity and response to augmentation. Saphenofemoral Junction: No evidence of thrombus. Normal compressibility and flow on color Doppler imaging. Profunda Femoral Vein: No evidence of thrombus. Normal compressibility and flow on color Doppler imaging. Femoral Vein: No evidence of thrombus. Normal compressibility, respiratory phasicity and response to augmentation. Popliteal Vein: No evidence of thrombus. Normal compressibility, respiratory phasicity and response to augmentation. Calf Veins: No evidence of thrombus. Normal compressibility and flow on color Doppler imaging. Superficial Great Saphenous Vein: No evidence of thrombus. Normal compressibility. Venous Reflux:  None. Other Findings: No evidence of superficial thrombophlebitis or abnormal fluid collection. IMPRESSION: No evidence of deep venous thrombosis in either lower extremity. Electronically Signed   By: Irish LackGlenn  Yamagata M.D.   On: 05/05/2020 16:46   DG Chest Port 1 View  Result Date: 05/03/2020 CLINICAL DATA:  Abnormal chest x-ray EXAM: PORTABLE CHEST 1 VIEW COMPARISON:  04/25/2020 FINDINGS: Peribronchial thickening and interstitial prominence. Heart is upper  limits normal in size. Left base opacity, likely atelectasis. No effusions or acute bony abnormality. IMPRESSION: Mild bronchitic changes.  Left base atelectasis. Electronically Signed   By: Charlett NoseKevin  Dover M.D.   On: 05/03/2020 13:49   DG Chest Portable 1 View  Result Date: 04/25/2020 CLINICAL DATA:  Altered mental status and weakness. EXAM: PORTABLE CHEST 1 VIEW COMPARISON:  April 19, 2020 FINDINGS: There is no evidence of acute infiltrate, pleural effusion or pneumothorax. The heart size and mediastinal contours are within normal limits. Chronic left-sided rib fractures are seen. The visualized skeletal structures are otherwise unremarkable. IMPRESSION: No active disease. Electronically Signed   By: Aram Candelahaddeus  Houston M.D.   On: 04/25/2020 20:08   DG Chest Portable 1 View  Result Date: 04/19/2020 CLINICAL DATA:  Evaluate for pneumonia. Altered mental status and weakness. EXAM: PORTABLE CHEST 1 VIEW COMPARISON:  04/11/2020 FINDINGS: Normal heart size. No pleural effusion or edema. No airspace opacities identified. Chronic appearing left posterolateral rib deformities are again noted and appears similar IMPRESSION: No acute cardiopulmonary abnormalities. Electronically Signed   By: Signa Kellaylor  Stroud M.D.   On: 04/19/2020 19:20      Subjective: No issues.  Discharge Exam: Vitals:   05/09/20 0639 05/09/20 0733  BP: (!) 161/67 (!) 169/67  Pulse: 84 84  Resp:  18  Temp:  98.7 F (37.1 C)  SpO2:  100%   Vitals:   05/09/20 0500 05/09/20 0517 05/09/20 0639 05/09/20 0733  BP:  (!) 196/72 (!) 161/67 (!) 169/67  Pulse:  86 84 84  Resp:    18  Temp:    98.7 F (37.1 C)  TempSrc:    Oral  SpO2:    100%  Weight: 59.9 kg     Height:        General: Pt is alert, awake, not in acute distress Cardiovascular: RRR, S1/S2 +, no rubs, no gallops Respiratory: CTA bilaterally, no wheezing, no rhonchi Abdominal: Soft, NT, ND, bowel sounds + Extremities: no edema, no cyanosis    The results of  significant diagnostics from this hospitalization (including imaging, microbiology, ancillary and laboratory) are listed below for reference.     Microbiology: Recent Results (from the past 240 hour(s))  Respiratory Panel by RT PCR (Flu A&B, Covid) - Nasopharyngeal Swab     Status: None   Collection Time: 05/03/20 10:21 AM   Specimen: Nasopharyngeal Swab  Result Value Ref Range Status   SARS Coronavirus 2 by RT PCR NEGATIVE NEGATIVE Final    Comment: (NOTE) SARS-CoV-2 target nucleic acids are NOT DETECTED.  The SARS-CoV-2 RNA is generally detectable in upper respiratoy specimens during the acute phase of infection. The lowest concentration of SARS-CoV-2 viral copies this assay can detect is 131 copies/mL. A negative result does not preclude SARS-Cov-2 infection and should not be used as the sole basis for treatment or other patient management decisions. A negative result may occur with  improper specimen collection/handling, submission of specimen other than nasopharyngeal swab, presence of viral mutation(s) within the areas targeted by this assay, and inadequate number of viral copies (<131 copies/mL). A negative result must be combined with clinical observations, patient history, and epidemiological information. The expected result is Negative.  Fact Sheet for Patients:  https://www.moore.com/  Fact Sheet for Healthcare Providers:  https://www.young.biz/  This test is no t yet approved or cleared by the [place] FDA and  has been authorized for detection and/or diagnosis of SARS-CoV-2 by FDA under an Emergency Use Authorization (EUA). This EUA will remain  in effect (meaning this test can be used) for the duration of the COVID-19 declaration under Section 564(b)(1) of the Act, 21 U.S.C. section 360bbb-3(b)(1), unless the authorization is terminated or revoked sooner.     Influenza A by PCR NEGATIVE NEGATIVE Final   Influenza B by  PCR NEGATIVE NEGATIVE Final    Comment: (NOTE) The Xpert Xpress SARS-CoV-2/FLU/RSV assay is intended as an aid in  the diagnosis of influenza from Nasopharyngeal swab specimens and  should not be used as a sole basis for treatment. Nasal washings and  aspirates are unacceptable for Xpert Xpress SARS-CoV-2/FLU/RSV  testing.  Fact Sheet for Patients: https://www.moore.com/  Fact Sheet for Healthcare Providers: https://www.young.biz/  This test is not yet approved or cleared by the [place] FDA and  has been authorized for detection and/or diagnosis of SARS-CoV-2 by  FDA under an Emergency Use Authorization (EUA). This EUA will remain  in effect (meaning this test can be used) for the duration of the  Covid-19 declaration under Section 564(b)(1) of the Act, 21  U.S.C. section 360bbb-3(b)(1), unless the authorization is  terminated or revoked. Performed at [facility], [address] [place],  [place] [address]   Urine Culture     Status: None   Collection Time: 05/03/20 10:21 AM   Specimen: Urine, Catheterized  Result Value Ref Range Status   Specimen Description   Final    URINE, CATHETERIZED Performed at [facility], [address], [place], [place] [address]    Special Requests   Final    NONE Performed at [facility], [address], [place], [place] [address]    Culture   Final    NO GROWTH Performed at [facility], [address] [place], [place] [address]    Report Status 05/04/2020 FINAL  Final  Culture, blood (routine x 2)     Status: None   Collection Time: 05/03/20  2:13 PM   Specimen: BLOOD  Result Value Ref Range Status   Specimen Description BLOOD LEFT ANTECUBITAL  Final   Special Requests   Final    BOTTLES DRAWN AEROBIC AND ANAEROBIC Blood Culture adequate volume   Culture   Final    NO GROWTH 5 DAYS Performed at [facility], [address] [place],  [place] [address]    Report Status 05/08/2020 FINAL  Final  Culture, blood (routine x 2)     Status: None   Collection Time: 05/03/20  2:13 PM   Specimen: BLOOD  Result Value Ref Range Status   Specimen Description BLOOD BLOOD LEFT HAND  Final   Special Requests   Final    BOTTLES DRAWN AEROBIC AND ANAEROBIC Blood Culture results may not be optimal due to an inadequate volume of blood received in culture bottles   Culture   Final    NO GROWTH 5 DAYS Performed at [facility], [address] [place], [place] [address]    Report Status 05/08/2020 FINAL  Final     Labs: BNP (last 3 results) Recent Labs    01/19/20 1612 05/03/20 1021  BNP 73.3 520.3*   Basic Metabolic Panel: Recent Labs  Lab 05/03/20 1021 05/03/20 1413 05/04/20 0334 05/04/20 1304 05/05/20 0437 05/06/20 0441 05/07/20 0628 05/08/20 0325 05/09/20 0348  NA 134*   < > 145  --  143 139 140 143 139  K 6.0*   < > 4.5   < > 3.2* 3.7 3.7 3.5 3.5  CL 96*   < > 102  --  96* 101 106 108 105  CO2 <7*   < > 29  --  32 30 27 28 24   GLUCOSE 841*   < > 155*  --  178* 153* 112* 86 240*  BUN 30*   < > 27*  --  20 12 6* 7* 7*  CREATININE 1.90*   < > 1.39*  --  0.96 0.85 0.66 0.70 0.64  CALCIUM 8.8*   < > 7.9*  --  7.5* 7.9* 8.6* 8.5* 8.5*  MG 2.3   < > 1.7  --  1.9 1.8 1.6* 1.8 1.7  PHOS 9.2*  --  3.1  --  3.1  --   --   --   --    < > = values in this interval not displayed.   Liver Function Tests: Recent Labs  Lab 05/03/20 1021  AST 42*  ALT 55*  ALKPHOS 80  BILITOT 2.0*  PROT 6.4*  ALBUMIN 3.3*   Recent Labs  Lab 05/03/20 1021  LIPASE 27   No results for input(s): AMMONIA in the last 168 hours. CBC: Recent Labs  Lab 05/03/20 1021 05/04/20  4401 05/05/20 0955 05/06/20 0441  WBC 26.2* 24.0* 17.3* 9.4  HGB 10.6* 9.9* 9.9* 9.0*  HCT 36.8 29.7* 29.9* 26.8*  MCV 108.6* 94.3 94.3 94.0  PLT 343 288 269 254   Cardiac Enzymes: No results for input(s): CKTOTAL, CKMB, CKMBINDEX, TROPONINI in the last  168 hours. BNP: Invalid input(s): POCBNP CBG: Recent Labs  Lab 05/08/20 1956 05/08/20 2040 05/08/20 2357 05/09/20 0411 05/09/20 0735  GLUCAP 183* 181* 187* 247* 241*   D-Dimer No results for input(s): DDIMER in the last 72 hours. Hgb A1c No results for input(s): HGBA1C in the last 72 hours. Lipid Profile No results for input(s): CHOL, HDL, LDLCALC, TRIG, CHOLHDL, LDLDIRECT in the last 72 hours. Thyroid function studies No results for input(s): TSH, T4TOTAL, T3FREE, THYROIDAB in the last 72 hours.  Invalid input(s): FREET3 Anemia work up No results for input(s): VITAMINB12, FOLATE, FERRITIN, TIBC, IRON, RETICCTPCT in the last 72 hours. Urinalysis    Component Value Date/Time   COLORURINE YELLOW (A) 05/03/2020 1021   APPEARANCEUR CLOUDY (A) 05/03/2020 1021   APPEARANCEUR Hazy 04/04/2012 2050   LABSPEC 1.016 05/03/2020 1021   LABSPEC 1.017 04/04/2012 2050   PHURINE 5.0 05/03/2020 1021   GLUCOSEU >=500 (A) 05/03/2020 1021   GLUCOSEU >=500 04/04/2012 2050   HGBUR SMALL (A) 05/03/2020 1021   BILIRUBINUR NEGATIVE 05/03/2020 1021   BILIRUBINUR Negative 04/04/2012 2050   KETONESUR 80 (A) 05/03/2020 1021   PROTEINUR NEGATIVE 05/03/2020 1021   NITRITE NEGATIVE 05/03/2020 1021   LEUKOCYTESUR NEGATIVE 05/03/2020 1021   LEUKOCYTESUR Negative 04/04/2012 2050   Sepsis Labs Invalid input(s): PROCALCITONIN,  WBC,  LACTICIDVEN Microbiology Recent Results (from the past 240 hour(s))  Respiratory Panel by RT PCR (Flu A&B, Covid) - Nasopharyngeal Swab     Status: None   Collection Time: 05/03/20 10:21 AM   Specimen: Nasopharyngeal Swab  Result Value Ref Range Status   SARS Coronavirus 2 by RT PCR NEGATIVE NEGATIVE Final    Comment: (NOTE) SARS-CoV-2 target nucleic acids are NOT DETECTED.  The SARS-CoV-2 RNA is generally detectable in upper respiratoy specimens during the acute phase of infection. The lowest concentration of SARS-CoV-2 viral copies this assay can detect is 131  copies/mL. A negative result does not preclude SARS-Cov-2 infection and should not be used as the sole basis for treatment or other patient management decisions. A negative result may occur with  improper specimen collection/handling, submission of specimen other than nasopharyngeal swab, presence of viral mutation(s) within the areas targeted by this assay, and inadequate number of viral copies (<131 copies/mL). A negative result must be combined with clinical observations, patient history, and epidemiological information. The expected result is Negative.  Fact Sheet for Patients:  https://www.moore.com/  Fact Sheet for Healthcare Providers:  https://www.young.biz/  This test is no t yet approved or cleared by the [place] FDA and  has been authorized for detection and/or diagnosis of SARS-CoV-2 by FDA under an Emergency Use Authorization (EUA). This EUA will remain  in effect (meaning this test can be used) for the duration of the COVID-19 declaration under Section 564(b)(1) of the Act, 21 U.S.C. section 360bbb-3(b)(1), unless the authorization is terminated or revoked sooner.     Influenza A by PCR NEGATIVE NEGATIVE Final   Influenza B by PCR NEGATIVE NEGATIVE Final    Comment: (NOTE) The Xpert Xpress SARS-CoV-2/FLU/RSV assay is intended as an aid in  the diagnosis of influenza from Nasopharyngeal swab specimens and  should not be used as a sole basis for treatment.  Nasal washings and  aspirates are unacceptable for Xpert Xpress SARS-CoV-2/FLU/RSV  testing.  Fact Sheet for Patients: https://www.moore.com/  Fact Sheet for Healthcare Providers: https://www.young.biz/  This test is not yet approved or cleared by the [place] FDA and  has been authorized for detection and/or diagnosis of SARS-CoV-2 by  FDA under an Emergency Use Authorization (EUA). This EUA will remain  in effect (meaning  this test can be used) for the duration of the  Covid-19 declaration under Section 564(b)(1) of the Act, 21  U.S.C. section 360bbb-3(b)(1), unless the authorization is  terminated or revoked. Performed at [facility], [address], [place], [place] [address]   Urine Culture     Status: None   Collection Time: 05/03/20 10:21 AM   Specimen: Urine, Catheterized  Result Value Ref Range Status   Specimen Description   Final    URINE, CATHETERIZED Performed at [facility], [address] [place], [place] [address]    Special Requests   Final    NONE Performed at [facility], [address], [place], [place] [address]    Culture   Final    NO GROWTH Performed at [facility], [address] [place], [place] [address]    Report Status 05/04/2020 FINAL  Final  Culture, blood (routine x 2)     Status: None   Collection Time: 05/03/20  2:13 PM   Specimen: BLOOD  Result Value Ref Range Status   Specimen Description BLOOD LEFT ANTECUBITAL  Final   Special Requests   Final    BOTTLES DRAWN AEROBIC AND ANAEROBIC Blood Culture adequate volume   Culture   Final    NO GROWTH 5 DAYS Performed at [facility], [address] [place], [place] [address]    Report Status 05/08/2020 FINAL  Final  Culture, blood (routine x 2)     Status: None   Collection Time: 05/03/20  2:13 PM   Specimen: BLOOD  Result Value Ref Range Status   Specimen Description BLOOD BLOOD LEFT HAND  Final   Special Requests   Final    BOTTLES DRAWN AEROBIC AND ANAEROBIC Blood Culture results may not be optimal due to an inadequate volume of blood received in culture bottles   Culture   Final    NO GROWTH 5 DAYS Performed at [facility], [address] [place], [place] [address]    Report Status 05/08/2020 FINAL  Final     Time coordinating discharge: Over 30 minutes  SIGNED:   Lynn Ito, MD  [facility] 05/09/2020, 10:27 AM Pager   If  7PM-7AM, please contact night-coverage [email or web address] Password [email or web address]

## 2020-05-09 NOTE — Progress Notes (Signed)
°   05/09/20 1100  Clinical Encounter Type  Visited With Patient  Visit Type Follow-up  Referral From Chaplain  Consult/Referral To Chaplain  Chaplain check the chart ans saw Pt was being discharged today and she checked in on her. Pt said she didn't feel to good this morning, but is feeling better now. Chaplain prayed with her and told her that she will check with her sister to find out how she is doing from time to time.

## 2020-05-09 NOTE — Plan of Care (Signed)
Pt is discharged today to snf for management of bg.

## 2020-05-09 NOTE — Consult Note (Signed)
PHARMACY CONSULT NOTE - FOLLOW UP  Pharmacy Consult for Electrolyte Monitoring and Replacement   Recent Labs: Potassium (mmol/L)  Date Value  05/09/2020 3.5  04/09/2012 4.0   Magnesium (mg/dL)  Date Value  80/10/4915 1.7  04/05/2012 2.4   Calcium (mg/dL)  Date Value  91/50/5697 8.5 (L)   Calcium, Total (mg/dL)  Date Value  94/80/1655 8.8   Albumin (g/dL)  Date Value  37/48/2707 3.3 (L)  02/17/2012 3.2 (L)   Phosphorus (mg/dL)  Date Value  86/75/4492 3.1   Sodium (mmol/L)  Date Value  05/09/2020 139  04/09/2012 142   Assessment: 79 y.o. female with a past medical history of diabetes, gastric reflux, hypertension, prior MI and CVA presents to the emergency department for decreased responsiveness and elevated blood glucose.   Pharmacy has been consulted to monitor and replenish electrolytes.  K 3.5 borderline hypokalemia - was replenished yesterday with KCL PO 20 mEq x1 dose Mg 1.7 - replenished yesterday with Mg 1g x1 dose  Goal of Therapy:   Electrolytes WNL  Plan:   Will order KCL PO 40 mEq x1 dose.   Will order Mg 2g x1 dose   Will recheck electrolytes tomorrow with AM labs.  Katha Cabal 05/09/2020 7:26 AM

## 2020-05-09 NOTE — TOC Progression Note (Signed)
Transition of Care [facility]) - Progression Note    Patient Details  Name: Vicki [name] MRN: [id number] Date of Birth: [birth year]  Transition of Care [facility]) CM/SW Contact  Trenton Founds, RN Phone Number: 05/09/2020, 9:14 AM  Clinical Narrative:  [facility] received insurance auth through [facility], ID# [id number], Navi ref# [id number], auth good for 3 days beginning 10/5 with next review date of 10/7. Reviewing case manager will be Rica Koyanagi.  RNCM completed EMS paperwork and will call for transport when patient is ready.     Expected Discharge Plan: Skilled Nursing Facility Barriers to Discharge: Continued Medical Work up  Expected Discharge Plan and Services Expected Discharge Plan: Skilled Nursing Facility In-house Referral: Clinical Social Work     Living arrangements for the past 2 months: Single Family Home                                       Social Determinants of Health (SDOH) Interventions    Readmission Risk Interventions Readmission Risk Prevention Plan 04/28/2020 01/20/2020 10/15/2019  Transportation Screening Complete Complete Complete  PCP or Specialist Appt within 3-5 Days - Complete Complete  HRI or Home Care Consult - - Complete  Social Work Consult for Recovery Care Planning/Counseling - Complete -  Palliative Care Screening - Not Applicable -  Medication Review [employer]) Complete Complete Complete  PCP or Specialist appointment within 3-5 days of discharge Complete - -  HRI or Home Care Consult Complete - -  SW Recovery Care/Counseling Consult Complete - -  Palliative Care Screening Not Applicable - -  Skilled Nursing Facility Not Applicable - -  Some recent data might be hidden

## 2020-05-09 NOTE — Plan of Care (Signed)
  Problem: Education: Goal: Ability to describe self-care measures that may prevent or decrease complications (Diabetes Survival Skills Education) will improve Outcome: Progressing Goal: Individualized Educational Video(s) Outcome: Progressing   Problem: Cardiac: Goal: Ability to maintain an adequate cardiac output will improve Outcome: Progressing   Problem: Health Behavior/Discharge Planning: Goal: Ability to identify and utilize available resources and services will improve Outcome: Progressing Goal: Ability to manage health-related needs will improve Outcome: Progressing   Problem: Fluid Volume: Goal: Ability to achieve a balanced intake and output will improve Outcome: Progressing   Problem: Metabolic: Goal: Ability to maintain appropriate glucose levels will improve Outcome: Progressing   Problem: Nutritional: Goal: Maintenance of adequate nutrition will improve Outcome: Progressing Goal: Maintenance of adequate weight for body size and type will improve Outcome: Progressing   Problem: Respiratory: Goal: Will regain and/or maintain adequate ventilation Outcome: Progressing   Problem: Urinary Elimination: Goal: Ability to achieve and maintain adequate renal perfusion and functioning will improve Outcome: Progressing   Problem: Education: Goal: Knowledge of General Education information will improve Description: Including pain rating scale, medication(s)/side effects and non-pharmacologic comfort measures Outcome: Progressing   Problem: Health Behavior/Discharge Planning: Goal: Ability to manage health-related needs will improve Outcome: Progressing   Problem: Clinical Measurements: Goal: Ability to maintain clinical measurements within normal limits will improve Outcome: Progressing Goal: Will remain free from infection Outcome: Progressing Goal: Diagnostic test results will improve Outcome: Progressing Goal: Respiratory complications will improve Outcome:  Progressing Goal: Cardiovascular complication will be avoided Outcome: Progressing   Problem: Activity: Goal: Risk for activity intolerance will decrease Outcome: Progressing   Problem: Nutrition: Goal: Adequate nutrition will be maintained Outcome: Progressing   Problem: Coping: Goal: Level of anxiety will decrease Outcome: Progressing   Problem: Elimination: Goal: Will not experience complications related to bowel motility Outcome: Progressing Goal: Will not experience complications related to urinary retention Outcome: Progressing   Problem: Pain Managment: Goal: General experience of comfort will improve Outcome: Progressing   Problem: Safety: Goal: Ability to remain free from injury will improve Outcome: Progressing   Problem: Skin Integrity: Goal: Risk for impaired skin integrity will decrease Outcome: Progressing   

## 2020-05-09 NOTE — Progress Notes (Signed)
Occupational Therapy Treatment Patient Details Name: Vicki [name] MRN: [id number] DOB: 07/12/1941 Today's Date: 05/09/2020    History of present illness presented to ER secondary to lethargy, decreased responsiveness; admitted for management of DKA, acute metabolic encephalopathy   OT comments  Ms Voong was seen for OT treatment on this date. Upon arrival to room pt reclined in bed requesting assistance to use restroom. Pt reports "aches all over", RN in room to administer pain medication. Pt required MOD A for toilet t/f, assist to stabilize RW and lift off from regular height toilet/bed. SUPERVISION for perihygiene in sitting. Pt demonstrated decreased safety awareness t/o - attempted to rise w/o assist, VCs for safe RW use, pt ultimately required MIN A + RW for RW mgmt ~30 ft in room mobility. CGA + VCs for hand washing standing sink side. MOD A don B socks long sitting in chair. SETUP self-drinking at bed level. Pt making good progress toward goals. Pt continues to benefit from skilled OT services to maximize return to PLOF and minimize risk of future falls, injury, caregiver burden, and readmission. Will continue to follow POC. Discharge recommendation remains appropriate.    Follow Up Recommendations  SNF    Equipment Recommendations   (defer to next venue)    Recommendations for Other Services      Precautions / Restrictions Precautions Precautions: Fall Restrictions Weight Bearing Restrictions: No       Mobility Bed Mobility Overal bed mobility: Needs Assistance Bed Mobility: Supine to Sit     Supine to sit: Supervision     General bed mobility comments: Increased time  Transfers Overall transfer level: Needs assistance Equipment used: Rolling walker (2 wheeled) Transfers: Sit to/from Stand Sit to Stand: Mod assist    General transfer comment: Assist to stabilize RW and lift off from regular height toilet/bed    Balance Overall balance assessment: Needs  assistance Sitting-balance support: No upper extremity supported;Feet supported Sitting balance-Leahy Scale: Good     Standing balance support: Bilateral upper extremity supported Standing balance-Leahy Scale: Poor          ADL either performed or assessed with clinical judgement   ADL Overall ADL's : Needs assistance/impaired       General ADL Comments: MOD A don B socks long sitting in chair. MIN A + RW for toilet t/f, SUPERVISION perihygiene. CGA + VCs for hand washing standing sink side. SETUP self-drinking at bed level.                Cognition Arousal/Alertness: Awake/alert Behavior During Therapy: WFL for tasks assessed/performed Overall Cognitive Status: History of cognitive impairments - at baseline             Exercises Exercises: Other exercises Other Exercises Other Exercises: Pt educated re: falls prevention, RW technique, ECS, importance of mobility for functional strengthening  Other Exercises: Toileting, LBD, self-drinking, sup>sit, sit<>stand x2, sitting/standing balance/tolerance, ~30 ft mobility           Pertinent Vitals/ Pain       Pain Assessment: Faces Faces Pain Scale: Hurts little more Pain Location: "aches all over"  Pain Descriptors / Indicators: Aching;Discomfort Pain Intervention(s): Monitored during session;RN gave pain meds during session;Repositioned         Frequency  Min 1X/week        Progress Toward Goals  OT Goals(current goals can now be found in the care plan section)  Progress towards OT goals: Progressing toward goals  Acute Rehab OT Goals Patient Stated Goal: to  stay as independent as i can OT Goal Formulation: With patient Time For Goal Achievement: 05/20/20 Potential to Achieve Goals: Good ADL Goals Pt Will Perform Grooming: with supervision;standing Pt Will Transfer to Toilet: with supervision;ambulating Pt Will Perform Toileting - Clothing Manipulation and hygiene: with supervision;sit to/from stand   Plan Discharge plan remains appropriate;Frequency remains appropriate       AM-PAC OT "6 Clicks" Daily Activity     Outcome Measure   Help from another person eating meals?: None Help from another person taking care of personal grooming?: A Little Help from another person toileting, which includes using toliet, bedpan, or urinal?: A Little Help from another person bathing (including washing, rinsing, drying)?: A Lot Help from another person to put on and taking off regular upper body clothing?: A Little Help from another person to put on and taking off regular lower body clothing?: A Lot 6 Click Score: 17    End of Session Equipment Utilized During Treatment: Rolling walker  OT Visit Diagnosis: Muscle weakness (generalized) (M62.81)   Activity Tolerance Patient tolerated treatment well   Patient Left in chair;with call bell/phone within reach;with chair alarm set;with nursing/sitter in room   Nurse Communication Mobility status        Time: 7614-7092 OT Time Calculation (min): 14 min  Charges: OT General Charges $OT Visit: 1 Visit OT Treatments $Self Care/Home Management : 8-22 mins  Kathie Dike, M.S. OTR/L  05/09/20, 9:31 AM  ascom [phone]

## 2020-05-10 ENCOUNTER — Emergency Department
Admission: EM | Admit: 2020-05-10 | Discharge: 2020-06-05 | Disposition: E | Payer: Medicare Other | Attending: Emergency Medicine | Admitting: Emergency Medicine

## 2020-05-10 ENCOUNTER — Other Ambulatory Visit: Payer: Self-pay

## 2020-05-10 DIAGNOSIS — Z7982 Long term (current) use of aspirin: Secondary | ICD-10-CM | POA: Diagnosis not present

## 2020-05-10 DIAGNOSIS — E1311 Other specified diabetes mellitus with ketoacidosis with coma: Secondary | ICD-10-CM

## 2020-05-10 DIAGNOSIS — Z79899 Other long term (current) drug therapy: Secondary | ICD-10-CM | POA: Insufficient documentation

## 2020-05-10 DIAGNOSIS — I214 Non-ST elevation (NSTEMI) myocardial infarction: Secondary | ICD-10-CM | POA: Diagnosis not present

## 2020-05-10 DIAGNOSIS — I469 Cardiac arrest, cause unspecified: Secondary | ICD-10-CM | POA: Insufficient documentation

## 2020-05-10 DIAGNOSIS — Z794 Long term (current) use of insulin: Secondary | ICD-10-CM | POA: Insufficient documentation

## 2020-05-10 DIAGNOSIS — E1110 Type 2 diabetes mellitus with ketoacidosis without coma: Secondary | ICD-10-CM | POA: Insufficient documentation

## 2020-05-10 DIAGNOSIS — I10 Essential (primary) hypertension: Secondary | ICD-10-CM | POA: Diagnosis not present

## 2020-05-10 DIAGNOSIS — R464 Slowness and poor responsiveness: Secondary | ICD-10-CM | POA: Diagnosis present

## 2020-05-10 DIAGNOSIS — R4182 Altered mental status, unspecified: Secondary | ICD-10-CM

## 2020-05-10 LAB — CBC
HCT: 33.2 % — ABNORMAL LOW (ref 36.0–46.0)
Hemoglobin: 9.3 g/dL — ABNORMAL LOW (ref 12.0–15.0)
MCH: 31.4 pg (ref 26.0–34.0)
MCHC: 28 g/dL — ABNORMAL LOW (ref 30.0–36.0)
MCV: 112.2 fL — ABNORMAL HIGH (ref 80.0–100.0)
Platelets: 344 10*3/uL (ref 150–400)
RBC: 2.96 MIL/uL — ABNORMAL LOW (ref 3.87–5.11)
RDW: 15.6 % — ABNORMAL HIGH (ref 11.5–15.5)
WBC: 21.7 10*3/uL — ABNORMAL HIGH (ref 4.0–10.5)
nRBC: 0 % (ref 0.0–0.2)

## 2020-05-10 LAB — URINALYSIS, COMPLETE (UACMP) WITH MICROSCOPIC
Bacteria, UA: NONE SEEN
Bilirubin Urine: NEGATIVE
Glucose, UA: >=500 mg/dL — AB
Hgb urine dipstick: NEGATIVE
Ketones, ur: 80 mg/dL — AB
Leukocytes,Ua: NEGATIVE
Nitrite: NEGATIVE
Protein, ur: NEGATIVE mg/dL
Specific Gravity, Urine: 1.017 (ref 1.005–1.030)
pH: 5 (ref 5.0–8.0)

## 2020-05-10 LAB — TROPONIN I (HIGH SENSITIVITY): Troponin I (High Sensitivity): 1557 ng/L (ref <18)

## 2020-05-10 MED ORDER — SODIUM CHLORIDE 0.9 % IV BOLUS
1000.0000 mL | Freq: Once | INTRAVENOUS | Status: AC
  Administered 2020-05-10: 1000 mL via INTRAVENOUS

## 2020-05-24 LAB — BLOOD GAS, VENOUS
Patient temperature: 37
pCO2, Ven: 23 mmHg — ABNORMAL LOW (ref 44.0–60.0)
pH, Ven: <6.9 — CL (ref 7.250–7.430)
pO2, Ven: 67 mmHg — ABNORMAL HIGH (ref 32.0–45.0)

## 2020-06-05 DIAGNOSIS — 419620001 Death: Secondary | SNOMED CT | POA: Diagnosis not present

## 2020-06-05 NOTE — ED Notes (Signed)
Report received by AM RN. Pt found as reported, unresponsive to pain. X2 NaCl 0.9% IV running wide open. Bear hugger on . Foley cath with temp probe had been inserted.

## 2020-06-05 NOTE — ED Notes (Signed)
Pt having episodes of bradycardia to asystole, lasting 20-30 seconds, rebounding to sinus bradycardia 40-50's. MD made aware.

## 2020-06-05 NOTE — ED Notes (Signed)
Pt went from bradycardia to asystole. No respiratory effort, no palpable pulses. MD called to room.

## 2020-06-05 NOTE — ED Provider Notes (Signed)
[facility] Emergency Department Provider Note  Time seen: 10:38 AM  I have reviewed the triage vital signs and the nursing notes.   HISTORY  Chief Complaint Altered mental status  HPI Vicki [name] is a 79 y.o. female with a past medical history of diabetes, hypertension, MI and CVA presents from her nursing facility for unresponsiveness.   According to EMS report the patient was discharged from the hospital yesterday, this morning on rounds the patient was found to be unresponsive and brought to the emergency department for evaluation.  Here patient is minimally responsive somnolent but does react to physical stimuli and occasionally to loud voice.  Will not answer questions or follow commands.  Patient does have a DO NOT RESUSCITATE order.  I reviewed the patient's records, she was discharged yesterday after admission for diabetic ketoacidosis.  Patient had lab work performed yesterday showing blood glucose of 240 with a anion gap of 10.  Past Medical History:  Diagnosis Date  . Diabetes mellitus without complication (HCC)   . GERD (gastroesophageal reflux disease)   . Hypertension   . Lesion of sciatic nerve, right side   . MI (myocardial infarction) (HCC)   . Stroke [facility])     Patient Active Problem List   Diagnosis Date Noted  . Diabetic acidosis (HCC)   . Goals of care, counseling/discussion   . Palliative care by specialist   . DNR (do not resuscitate)   . Weakness of extremity 05/07/2020  . Encephalopathy 05/03/2020  . Hypernatremia 04/19/2020  . Hyperkalemia 04/19/2020  . Hyperlipidemia 04/19/2020  . Sepsis (HCC) 01/19/2020  . Alzheimer's dementia without behavioral disturbance (HCC)   . DKA (diabetic ketoacidoses) 10/14/2019  . Diabetic ketoacidosis without coma associated with diabetes mellitus due to underlying condition (HCC)   . Dehydration 10/06/2019  . Generalized weakness 10/06/2019  . AKI (acute kidney injury) (HCC) 10/06/2019  .  Hypertension associated with diabetes (HCC)   . Hypoglycemia 12/10/2017  . 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 NSTEMI (non-ST elevated myocardial infarction) (HCC) 10/01/2016  . 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 Diabetes (HCC) 09/29/2016  . GERD (gastroesophageal reflux disease) 09/29/2016  . Nausea and vomiting 03/24/2016  . Diffuse abdominal pain 03/24/2016  . Hypokalemia 03/24/2016  . Chest pain 03/24/2016  . Elevated troponin I level 09/26/2015  . 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HCC) 09/25/2015    Past Surgical History:  Procedure Laterality Date  . ABDOMINAL HYSTERECTOMY    . BREAST BIOPSY Right    neg  . BREAST SURGERY    . LEFT HEART CATH AND CORONARY ANGIOGRAPHY N/A 10/01/2016   Procedure: Left Heart Cath and Coronary Angiography and possible PCI;  Surgeon: Alwyn Pea, MD;  Location: [facility] INVASIVE CV LAB;  Service: Cardiovascular;  Laterality: N/A;    Prior to Admission medications   Medication Sig Start Date End Date Taking? Authorizing Provider  acetaminophen (TYLENOL) 500 MG tablet Take 2 tablets (1,000 mg total) by mouth every 6 (six) hours as needed for mild pain or headache. 04/22/20   Lewie Chamber, MD  aspirin EC 81 MG EC tablet Take 1 tablet (81 mg total) by mouth daily. 03/26/16   Katharina Caper, MD  brimonidine-timolol (COMBIGAN) 0.2-0.5 % ophthalmic solution Place 1 drop into both eyes daily.    [provider]  citalopram (CELEXA) 20 MG tablet Take 20 mg by mouth daily.    [provider]  diclofenac Sodium (  VOLTAREN) 1 % GEL Apply 2 g topically 4 (four) times daily as needed (for joint pain). 05/09/20   Lynn Ito, MD  docusate sodium (COLACE) 100 MG capsule Take 1 capsule (100 mg total) by mouth 2 (two) times daily as needed for mild constipation. 05/09/20   Lynn Ito, MD  donepezil (ARICEPT) 5 MG tablet Take 5 mg by mouth at bedtime.    [provider]   Ensure Max Protein (ENSURE MAX PROTEIN) LIQD Take 330 mLs (11 oz total) by mouth 2 (two) times daily between meals. 10/18/19   Rolly Salter, MD  famotidine (PEPCID) 20 MG tablet Take 20 mg by mouth daily. 03/06/20   [provider]  hydrALAZINE (APRESOLINE) 50 MG tablet Take 50 mg by mouth 3 (three) times daily.    [provider]  insulin glargine (LANTUS) 100 UNIT/ML injection Inject 0.1 mLs (10 Units total) into the skin at bedtime. 05/09/20   Lynn Ito, MD  insulin lispro (HUMALOG) 100 UNIT/ML injection Inject 0.02 mLs (2 Units total) into the skin 3 (three) times daily with meals. (plus sliding scale) 05/09/20   Lynn Ito, MD  isosorbide mononitrate (IMDUR) 30 MG 24 hr tablet Take 30 mg by mouth daily. 04/19/20   [provider]  loratadine (CLARITIN) 10 MG tablet Take 10 mg by mouth daily. 02/09/20   [provider]  losartan (COZAAR) 50 MG tablet Take 50 mg by mouth at bedtime.    [provider]  lovastatin (MEVACOR) 40 MG tablet Take 40 mg by mouth daily after supper.     [provider]  Magnesium Oxide 400 (240 Mg) MG TABS Take 400 mg by mouth daily.  01/10/20   [provider]  memantine (NAMENDA) 5 MG tablet Take 5 mg by mouth 2 (two) times daily.     [provider]  Multiple Vitamins-Minerals (CENTRUM ADULTS) TABS Take 1 tablet by mouth every morning.     [provider]  polyethylene glycol (MIRALAX / GLYCOLAX) 17 g packet Take 17 g by mouth daily as needed for moderate constipation. 05/09/20   Lynn Ito, MD  saccharomyces boulardii (FLORASTOR) 250 MG capsule Take 250 mg by mouth 2 (two) times daily.    [provider]  simethicone (MYLICON) 80 MG chewable tablet Chew 1 tablet (80 mg total) by mouth 4 (four) times daily as needed for flatulence. 10/18/19   Rolly Salter, MD    No Known Allergies  Family History  Problem Relation Age of Onset  . Heart disease Mother   . Diabetes Mellitus  II Father   . Hypertension Sister   . Diabetes Mellitus II Sister   . Breast cancer Sister 57  . Hypertension Brother   . Diabetes Mellitus II Brother     Social History Social History   Tobacco Use  . Smoking status: Never Smoker  . Smokeless tobacco: Never Used  Vaping Use  . Vaping Use: Never used  Substance Use Topics  . Alcohol use: No  . Drug use: No    Review of Systems Unable to complete an adequate/accurate review of systems secondary to altered mental status.  ____________________________________________   PHYSICAL EXAM:  VITAL SIGNS: ED Triage Vitals  Enc Vitals Group     BP --      Pulse Rate 05-28-2020 1012 73     Resp 28-May-2020 1012 16     Temp 05/28/20 1012 (!) 93.8 F (34.3 C)     Temp Source 05-28-2020 1012  Rectal     SpO2 --      Weight Jun 08, 2020 0958 134 lb 4.8 oz (60.9 kg)     Height 2020/06/08 0958 5\' 6"  (1.676 m)     Head Circumference --      Peak Flow --      Pain Score Jun 08, 2020 0958 0     Pain Loc --      Pain Edu? --      Excl. in GC? --     Constitutional: Somnolent occasionally will react to voice or physical stimuli.  Not answering questions or following commands. Eyes: Normal exam ENT      Head: Normocephalic and atraumatic.      Mouth/Throat: Dry mucous membranes Cardiovascular: Normal rate, regular rhythm.  Respiratory: Normal respiratory effort without tachypnea nor retractions. Breath sounds are clear  Gastrointestinal: Soft, no distention.  No reaction to palpation. Musculoskeletal: Atraumatic extremities. Neurologic: Somnolent.  Minimally responsive. Skin:  Skin is warm, dry  Psychiatric: Altered mental status  ____________________________________________    EKG  EKG viewed and interpreted by myself shows a normal sinus rhythm at 69 bpm with a narrow QRS, normal axis, normal intervals, nonspecific ST changes.  ____________________________________________    INITIAL IMPRESSION / ASSESSMENT AND PLAN / ED  COURSE  Pertinent labs & imaging results that were available during my care of the patient were reviewed by me and considered in my medical decision making (see chart for details).   Patient presents to the emergency department with minimal responsive/altered mental status found to have a blood glucose reading of "high" per EMS.  Patient was discharged yesterday after an admission for DKA.  Patient is minimally responsive but has a DO NOT RESUSCITATE order.  We will continue with IV hydration while awaiting lab results.  Patient will require admission to the hospital once her work-up is been completed.  Patient found to be hypothermic in the emergency department and a bear hugger has been applied.  Patient was hypotensive with EMS, currently receiving IV fluids.  Patient's labs have resulted showing significant troponin elevation, significant leukocytosis.  Patient receiving IV fluids.  We are waiting for the labs to result such as VBG and chemistry however prior to results of these labs patient continued to deteriorate at times become bradycardic down into the 30s.  I placed a palliative care consult however within approximately 30 minutes of placing this consult unfortunately the patient continued to decline and deteriorate despite aggressive fluid resuscitation patient blood pressure declined heart rate declined and the patient became asystolic.  As per her wishes with a DO NOT RESUSCITATE order resuscitation was not attempted on the patient peacefully passed away.  I called the sister and informed her over the phone and they will be coming to the emergency department.  After the patient had expired the blood gas did result showing pH less than 6.9, chemistry never resulted however I highly suspect the patient remained in DKA with an elevated troponin consistent with NSTEMI.  Vicki [name] was evaluated in Emergency Department on 08-Jun-2020 for the symptoms described in the history of present illness.  She was evaluated in the context of the global COVID-19 pandemic, which necessitated consideration that the patient might be at risk for infection with the SARS-CoV-2 virus that causes COVID-19. Institutional protocols and algorithms that pertain to the evaluation of patients at risk for COVID-19 are in a state of rapid change based on information released by regulatory bodies including the CDC and federal and state organizations.  These policies and algorithms were followed during the patient's care in the ED.  CRITICAL CARE Performed by: Minna AntisKevin Atavia Poppe   Total critical care time: 30 minutes  Critical care time was exclusive of separately billable procedures and treating other patients.  Critical care was necessary to treat or prevent imminent or life-threatening deterioration.  Critical care was time spent personally by me on the following activities: development of treatment plan with patient and/or surrogate as well as nursing, discussions with consultants, evaluation of patient's response to treatment, examination of patient, obtaining history from patient or surrogate, ordering and performing treatments and interventions, ordering and review of laboratory studies, ordering and review of radiographic studies, pulse oximetry and re-evaluation of patient's condition.   ____________________________________________   FINAL CLINICAL IMPRESSION(S) / ED DIAGNOSES  Diabetic ketoacidosis NSTEMI Cardiac arrest   Minna AntisPaduchowski, Rhyen Mazariego, MD 2019/12/08 1438

## 2020-06-05 NOTE — ED Notes (Signed)
[facility] [employer] [facility] contacted and deferred pt.  ME office deferred pt.

## 2020-06-05 NOTE — ED Notes (Signed)
Small area noted to center of buttocks where skin is pink. Area covered with mediplex.

## 2020-06-05 NOTE — ED Triage Notes (Signed)
Pt here via [facility] from [facility] with hypotension, 58/30. Pt lethargic on arrival to ED. Pt cbg read high with ems.

## 2020-06-05 DEATH — deceased
